# Patient Record
Sex: Male | Born: 1945
Health system: Southern US, Community
[De-identification: ages and names within clinical notes are randomized; demographics above are authoritative.]

## PROBLEM LIST (undated history)

## (undated) DIAGNOSIS — N183 Chronic kidney disease, stage 3 unspecified: Secondary | ICD-10-CM

## (undated) DIAGNOSIS — I6381 Other cerebral infarction due to occlusion or stenosis of small artery: Secondary | ICD-10-CM

## (undated) DIAGNOSIS — F329 Major depressive disorder, single episode, unspecified: Secondary | ICD-10-CM

## (undated) DIAGNOSIS — E785 Hyperlipidemia, unspecified: Secondary | ICD-10-CM

## (undated) DIAGNOSIS — G3184 Mild cognitive impairment, so stated: Secondary | ICD-10-CM

## (undated) DIAGNOSIS — R296 Repeated falls: Secondary | ICD-10-CM

## (undated) DIAGNOSIS — R269 Unspecified abnormalities of gait and mobility: Secondary | ICD-10-CM

## (undated) DIAGNOSIS — N419 Inflammatory disease of prostate, unspecified: Secondary | ICD-10-CM

## (undated) DIAGNOSIS — F32A Depression, unspecified: Secondary | ICD-10-CM

## (undated) DIAGNOSIS — I10 Essential (primary) hypertension: Secondary | ICD-10-CM

## (undated) DIAGNOSIS — E119 Type 2 diabetes mellitus without complications: Secondary | ICD-10-CM

## (undated) DIAGNOSIS — M722 Plantar fascial fibromatosis: Secondary | ICD-10-CM

## (undated) DIAGNOSIS — K219 Gastro-esophageal reflux disease without esophagitis: Secondary | ICD-10-CM

## (undated) DIAGNOSIS — N529 Male erectile dysfunction, unspecified: Secondary | ICD-10-CM

## (undated) HISTORY — PX: COLONOSCOPY: SHX174

## (undated) HISTORY — DX: Other cerebral infarction due to occlusion or stenosis of small artery: I63.81

## (undated) HISTORY — DX: Chronic kidney disease, stage 3 unspecified: N18.30

## (undated) HISTORY — DX: Repeated falls: R29.6

## (undated) HISTORY — DX: Plantar fascial fibromatosis: M72.2

## (undated) HISTORY — DX: Unspecified abnormalities of gait and mobility: R26.9

## (undated) HISTORY — DX: Essential (primary) hypertension: I10

## (undated) HISTORY — DX: Gastro-esophageal reflux disease without esophagitis: K21.9

## (undated) HISTORY — DX: Type 2 diabetes mellitus without complications: E11.9

## (undated) HISTORY — DX: Inflammatory disease of prostate, unspecified: N41.9

## (undated) HISTORY — PX: CIRCUMCISION: SUR203

## (undated) HISTORY — DX: Mild cognitive impairment of uncertain or unknown etiology: G31.84

## (undated) HISTORY — DX: Male erectile dysfunction, unspecified: N52.9

## (undated) HISTORY — DX: Major depressive disorder, single episode, unspecified: F32.9

## (undated) HISTORY — DX: Hyperlipidemia, unspecified: E78.5

## (undated) HISTORY — DX: Depression, unspecified: F32.A

---
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
## 2004-08-22 ENCOUNTER — Ambulatory Visit (HOSPITAL_COMMUNITY): Admission: RE | Admit: 2004-08-22 | Discharge: 2004-08-22 | Payer: Self-pay | Admitting: Gastroenterology

## 2014-10-22 ENCOUNTER — Encounter: Payer: Self-pay | Admitting: *Deleted

## 2015-09-07 DIAGNOSIS — L309 Dermatitis, unspecified: Secondary | ICD-10-CM | POA: Diagnosis not present

## 2015-10-09 DIAGNOSIS — E782 Mixed hyperlipidemia: Secondary | ICD-10-CM | POA: Diagnosis not present

## 2015-10-09 DIAGNOSIS — N183 Chronic kidney disease, stage 3 (moderate): Secondary | ICD-10-CM | POA: Diagnosis not present

## 2015-10-09 DIAGNOSIS — Z0000 Encounter for general adult medical examination without abnormal findings: Secondary | ICD-10-CM | POA: Diagnosis not present

## 2015-10-09 DIAGNOSIS — I10 Essential (primary) hypertension: Secondary | ICD-10-CM | POA: Diagnosis not present

## 2015-10-09 DIAGNOSIS — R399 Unspecified symptoms and signs involving the genitourinary system: Secondary | ICD-10-CM | POA: Diagnosis not present

## 2015-10-09 DIAGNOSIS — E1122 Type 2 diabetes mellitus with diabetic chronic kidney disease: Secondary | ICD-10-CM | POA: Diagnosis not present

## 2015-10-09 DIAGNOSIS — Z1159 Encounter for screening for other viral diseases: Secondary | ICD-10-CM | POA: Diagnosis not present

## 2015-10-09 DIAGNOSIS — N402 Nodular prostate without lower urinary tract symptoms: Secondary | ICD-10-CM | POA: Diagnosis not present

## 2016-02-02 DIAGNOSIS — N400 Enlarged prostate without lower urinary tract symptoms: Secondary | ICD-10-CM | POA: Diagnosis not present

## 2016-02-02 DIAGNOSIS — N183 Chronic kidney disease, stage 3 (moderate): Secondary | ICD-10-CM | POA: Diagnosis not present

## 2016-02-02 DIAGNOSIS — E782 Mixed hyperlipidemia: Secondary | ICD-10-CM | POA: Diagnosis not present

## 2016-02-02 DIAGNOSIS — K219 Gastro-esophageal reflux disease without esophagitis: Secondary | ICD-10-CM | POA: Diagnosis not present

## 2016-02-02 DIAGNOSIS — Z794 Long term (current) use of insulin: Secondary | ICD-10-CM | POA: Diagnosis not present

## 2016-02-02 DIAGNOSIS — Z7984 Long term (current) use of oral hypoglycemic drugs: Secondary | ICD-10-CM | POA: Diagnosis not present

## 2016-02-02 DIAGNOSIS — I10 Essential (primary) hypertension: Secondary | ICD-10-CM | POA: Diagnosis not present

## 2016-02-02 DIAGNOSIS — E1122 Type 2 diabetes mellitus with diabetic chronic kidney disease: Secondary | ICD-10-CM | POA: Diagnosis not present

## 2016-02-02 DIAGNOSIS — N529 Male erectile dysfunction, unspecified: Secondary | ICD-10-CM | POA: Diagnosis not present

## 2016-02-02 LAB — CBC AND DIFFERENTIAL: WBC: 6.8 10^3/mL

## 2016-02-02 LAB — BASIC METABOLIC PANEL
BUN: 33 mg/dL — AB (ref 4–21)
Creatinine: 1.5 mg/dL — AB (ref 0.6–1.3)
Glucose: 189 mg/dL
POTASSIUM: 4.1 mmol/L (ref 3.4–5.3)
SODIUM: 137 mmol/L (ref 137–147)

## 2016-02-02 LAB — LIPID PANEL
HDL: 28 mg/dL — AB (ref 35–70)
LDL Cholesterol: 97 mg/dL
LDL/HDL RATIO: 6.4

## 2016-02-02 LAB — TSH: TSH: 1.71 u[IU]/mL (ref 0.41–5.90)

## 2016-02-02 LAB — HEMOGLOBIN A1C: Hemoglobin A1C: 8.4

## 2016-02-22 ENCOUNTER — Encounter: Payer: Self-pay | Admitting: *Deleted

## 2016-02-28 ENCOUNTER — Ambulatory Visit (INDEPENDENT_AMBULATORY_CARE_PROVIDER_SITE_OTHER): Payer: Commercial Managed Care - HMO | Admitting: Endocrinology

## 2016-02-28 ENCOUNTER — Encounter: Payer: Self-pay | Admitting: Endocrinology

## 2016-02-28 VITALS — BP 120/70 | HR 61 | Temp 97.6°F | Resp 12 | Ht 69.0 in | Wt 184.6 lb

## 2016-02-28 DIAGNOSIS — E1165 Type 2 diabetes mellitus with hyperglycemia: Secondary | ICD-10-CM | POA: Insufficient documentation

## 2016-02-28 DIAGNOSIS — E1122 Type 2 diabetes mellitus with diabetic chronic kidney disease: Secondary | ICD-10-CM | POA: Diagnosis not present

## 2016-02-28 DIAGNOSIS — E782 Mixed hyperlipidemia: Secondary | ICD-10-CM | POA: Insufficient documentation

## 2016-02-28 DIAGNOSIS — N182 Chronic kidney disease, stage 2 (mild): Secondary | ICD-10-CM

## 2016-02-28 DIAGNOSIS — I10 Essential (primary) hypertension: Secondary | ICD-10-CM | POA: Diagnosis not present

## 2016-02-28 DIAGNOSIS — Z794 Long term (current) use of insulin: Secondary | ICD-10-CM

## 2016-02-28 HISTORY — DX: Mixed hyperlipidemia: E78.2

## 2016-02-28 HISTORY — DX: Type 2 diabetes mellitus with hyperglycemia: E11.65

## 2016-02-28 MED ORDER — DULAGLUTIDE 0.75 MG/0.5ML ~~LOC~~ SOAJ: SUBCUTANEOUS | Status: DC

## 2016-02-28 NOTE — Patient Instructions (Addendum)
May take lantus in ams: reduce to 50 units and if sugars are <100 twice reduce by 5 units  Check blood sugars on waking up 3  times a week Also check blood sugars about 2 hours after a meal and do this after different meals by rotation  Recommended blood sugar levels on waking up is 90-130 and about 2 hours after meal is 130-160  Please bring your blood sugar monitor to each visit, thank you  Start TRULICITYwith the pen as shown once weekly on the same day of the week.  You may inject in the stomach, thigh or arm as indicated in the brochure given.  You will feel fullness of the stomach with starting the medication and should try to keep the portions at meals small.   You may experience nausea in the first few days which usually gets better over time   If any questions or concerns are present call the office or the  [facility] at [phone].  Also visit [email or web address] website for more useful information  Start brisk walking daily

## 2016-02-28 NOTE — Progress Notes (Signed)
Patient ID: Joel Webb, male   DOB: [birth year], 70 y.o.   MRN: [id number]           Reason for Appointment: Consultation for Type 2 Diabetes  Referring physician: Moreen Fowler  History of Present Illness:          Date of diagnosis of type 2 diabetes mellitus: 2007       Background history:   He is unclear when he was diagnosed to have diabetes but was not very symptomatic He had been on metformin alone for several years with reportedly good control Also had previously been very consistent with exercise and diet His blood sugars probably started going up 3-4 years ago and he was given Lantus insulin in addition  Recent history:   INSULIN regimen is: Lantus  53 units in p.m. since 11/16  Current blood sugar patterns and problems identified:  He is being referred here for persistently high A1c of 8.4 since 11/16  He apparently has not tried any diabetes medications except insulin and metformin in the past  He does not check his blood sugars after meals and is doing them only in the morning  He thinks his blood sugars maybe higher on some mornings because of forgetting the Lantus at night, he tries to take this between 6-8 PM usually  Does not do any programmed exercise  Although he is trying to eat healthier meals he is usually eating a significant amount of carbohydrates at all meals; does not eat protein at breakfast usually     Non-insulin hypoglycemic drugs the patient is taking are:      Side effects from medications have been:  Compliance with the medical regimen: fair forget Hypoglycemia:    Glucose monitoring:  done 0.4 times a day         Glucometer:  Accu-Chek Blood Glucose readings by time of day and averages from meter download:  PREMEAL Breakfast Lunch Dinner Bedtime  Overall   Glucose range: 97-211       Median: 168        Self-care: The diet that the patient has been following is: tries to limit High-fat foods .      Typical meal intake: Breakfast is  toast, grits              Dietician visit, most recent: At diagnosis only               Exercise:  minimal except doing some lifting while working   Weight history:  Wt Readings from Last 3 Encounters:  02/28/16 184 lb 9.6 oz (83.734 kg)    Glycemic control:   Lab Results  Component Value Date   HGBA1C 8.4 02/02/2016   Lab Results  Component Value Date   LDLCALC 97 02/02/2016   CREATININE 1.5* 02/02/2016   No results found for: MICRALBCREAT        Medication List       This list is accurate as of: 02/28/16 12:51 PM.  Always use your most recent med list.               aspirin 81 MG chewable tablet  Chew by mouth daily.     atorvastatin 80 MG tablet  Commonly known as:  LIPITOR  Take 80 mg by mouth daily.     Dulaglutide 0.75 MG/0.5ML Sopn  Commonly known as:  TRULICITY  Inject in the abdominal skin as directed once a week     fenofibrate 160 MG tablet  Take 160 mg by mouth daily.     LANTUS 100 UNIT/ML injection  Generic drug:  insulin glargine  Inject 53 Units into the skin at bedtime. 50 UNITS AT NIGHT     losartan-hydrochlorothiazide 100-12.5 MG tablet  Commonly known as:  HYZAAR  Take 1 tablet by mouth daily.     metFORMIN 1000 MG tablet  Commonly known as:  GLUCOPHAGE  Take 1,000 mg by mouth 2 (two) times daily with a meal.     omeprazole 20 MG capsule  Commonly known as:  PRILOSEC  Take 20 mg by mouth every other day.     tamsulosin 0.4 MG Caps capsule  Commonly known as:  FLOMAX  Take 0.4 mg by mouth.        Allergies: No Known Allergies  Past Medical History  Diagnosis Date  . Diabetes mellitus without complication ([place])   . Hypertension   . ED (erectile dysfunction)   . Plantar fasciitis   . Prostatitis     Past Surgical History  Procedure Laterality Date  . Circumcision    . Colonoscopy      Family History  Problem Relation Age of Onset  . Hepatitis C Mother   . Mesothelioma Father   . Diabetes Father   . Heart  disease Father     Social History:  reports that he has quit smoking. He does not have any smokeless tobacco history on file. He reports that he drinks alcohol. He reports that he does not use illicit drugs.    Review of Systems    Lipid history: Has been on Lipitor, has triglycerides over 200    Lab Results  Component Value Date   HDL 28* 02/02/2016   LDLCALC 97 02/02/2016           Hypertension:Long-standing, treated with Hyzaar  Most recent eye exam was in 11/16  Most recent foot exam: 3/17  Review of Systems  Constitutional: Negative for weight loss.  Eyes: Negative for blurred vision.  Respiratory: Negative for shortness of breath.   Cardiovascular: Negative for chest pain, palpitations and leg swelling.  Gastrointestinal: Negative for diarrhea.  Endocrine: Positive for decreased libido and erectile dysfunction. Negative for fatigue and polydipsia.  Genitourinary: Positive for nocturia.       Getting up at night 1 or 2 times  Musculoskeletal: Negative for joint pain.  Skin: Negative for rash.  Neurological: Negative for balance difficulty.       Occasional mild tingling in his feet.  Previously was having sharp pains in his feet.  No numbness  Psychiatric/Behavioral: Negative for insomnia.      LABS:  Abstract on 02/22/2016  Component Date Value Ref Range Status  . WBC 02/02/2016 6.8   Final  . Glucose 02/02/2016 189   Final  . BUN 02/02/2016 33* 4 - 21 mg/dL Final  . Creatinine 02/02/2016 1.5* 0.6 - 1.3 mg/dL Final  . Potassium 02/02/2016 4.1  3.4 - 5.3 mmol/L Final  . Sodium 02/02/2016 137  137 - 147 mmol/L Final  . LDl/HDL Ratio 02/02/2016 6.4   Final  . HDL 02/02/2016 28* 35 - 70 mg/dL Final  . LDL Cholesterol 02/02/2016 97   Final  . Hemoglobin A1C 02/02/2016 8.4   Final  . TSH 02/02/2016 1.71  0.41 - 5.90 uIU/mL Final    Physical Examination:  BP 120/70 mmHg  Pulse 61  Temp(Src) 97.6 F (36.4 C) (Oral)  Resp 12  Ht 5\' 9"  (1.753 m)  Wt 184  lb 9.6 oz (83.734 kg)  BMI 27.25 kg/m2  SpO2 96%  GENERAL: He is well built and nourished, minimal abdominal obesity HEENT:         Eye exam shows normal external appearance. Fundus exam shows no retinopathy.  Oral exam shows normal mucosa .  NECK:   There is no lymphadenopathy Thyroid is not enlarged and no nodules felt.  Carotids are normal to palpation and no bruit heard LUNGS:         Chest is symmetrical. Lungs are clear to auscultation.Marland Kitchen   HEART:         Heart sounds:  S1 and S2 are normal. No murmur or click heard., no S3 or S4.   ABDOMEN:   There is no distention present. Liver and spleen are not palpable. No other mass or tenderness present.   NEUROLOGICAL:   Ankle jerks are absent bilaterally.    Diabetic Foot Exam - Simple   Simple Foot Form  Diabetic Foot exam was performed with the following findings:  Yes 02/28/2016 12:29 PM  Visual Inspection  No deformities, no ulcerations, no other skin breakdown bilaterally:  Yes  Sensation Testing  Intact to touch and monofilament testing bilaterally:  Yes  Pulse Check  Posterior Tibialis and Dorsalis pulse intact bilaterally:  Yes  Comments             Vibration sense is Moderately reduced in distal first toes. MUSCULOSKELETAL:  There is no swelling or deformity of the peripheral joints. Spine is normal to inspection.   EXTREMITIES:     There is no edema. No skin lesions present.Marland Kitchen SKIN:       No rash or lesions of concern.        ASSESSMENT:  Diabetes type 2, uncontrolled with last A1c 8.4 Currently taking basal insulin and metformin Although his fasting blood sugars are averaging 168 which he has high postprandial readings which he does not monitor at all. His diet and exercise regimen is suboptimal as discussed above  Although he is being managed with metformin and insulin he has never tried other diabetes medications including GLP-1 drugs. His creatinine clearance as of 3/17 is borderline for use of SGLT 2 drugs      Complications: Erectile dysfunction, mild neuropathy  HYPERTENSION: Long-standing history  Renal dysfunction of unclear etiology, mild.  HYPERLIPIDEMIA: Controlled with 80 mg Lipitor but tends to have persistently high triglycerides  PLAN:    Discussed with the patient the nature of GLP-1 drugs, the action on various organ systems, how they benefit blood glucose control, as well as the benefit of weight loss and  increase satiety . Explained possible side effects especially nausea and vomiting; discussed safety information in package insert. Demonstrated the medication injection device and injection technique to the patient. Discussed injection sites and titration of Trulicity starting with 0.75 mg once a week for 4 weeks and then increasing to 1.5 mg if no symptoms of nausea. Patient brochure on Trulicity and co-pay card given  He can try taking Lantus in the morning for better compliance, for now can reduce the dose to 50 units  Discussed gradually decreasing Lantus dose by 5 units if fasting blood sugars with starting Trulicity start coming down to 100 or below  Try to monitor fasting and postprandial readings by rotation  Regular walking  Add a protein to breakfast  Consultation with dietitian  Follow-up in 3 weeks  Patient Instructions  May take lantus in ams: reduce to 50 units and if  sugars are <100 twice reduce by 5 units  Check blood sugars on waking up 3  times a week Also check blood sugars about 2 hours after a meal and do this after different meals by rotation  Recommended blood sugar levels on waking up is 90-130 and about 2 hours after meal is 130-160  Please bring your blood sugar monitor to each visit, thank you  Start TRULICITYwith the pen as shown once weekly on the same day of the week.  You may inject in the stomach, thigh or arm as indicated in the brochure given.  You will feel fullness of the stomach with starting the medication and should try to keep  the portions at meals small.   You may experience nausea in the first few days which usually gets better over time   If any questions or concerns are present call the office or the  [facility] at [phone].  Also visit [email or web address] website for more useful information  Start brisk walking daily      Zayde Stroupe 02/28/2016, 12:51 PM   Note: This office note was prepared with Dragon voice recognition system technology. Any transcriptional errors that result from this process are unintentional.

## 2016-03-28 ENCOUNTER — Ambulatory Visit (INDEPENDENT_AMBULATORY_CARE_PROVIDER_SITE_OTHER): Payer: Self-pay | Admitting: Endocrinology

## 2016-03-28 VITALS — BP 124/80 | HR 63 | Temp 97.9°F | Resp 16 | Ht 69.0 in | Wt 182.4 lb

## 2016-03-28 DIAGNOSIS — E1165 Type 2 diabetes mellitus with hyperglycemia: Secondary | ICD-10-CM

## 2016-03-28 DIAGNOSIS — Z794 Long term (current) use of insulin: Secondary | ICD-10-CM | POA: Diagnosis not present

## 2016-03-28 MED ORDER — DULAGLUTIDE 0.75 MG/0.5ML ~~LOC~~ SOAJ: SUBCUTANEOUS | Status: DC

## 2016-03-28 NOTE — Patient Instructions (Signed)
Start some exercise  Check blood sugars on waking up 3  times a week Also check blood sugars about 2 hours after a meal and do this after different meals by rotation  Recommended blood sugar levels on waking up is 90-130 and about 2 hours after meal is 130-160  Please bring your blood sugar monitor to each visit, thank you

## 2016-03-28 NOTE — Progress Notes (Signed)
Patient ID: EUAL GUIDICE, male   DOB: Apr 04, 1946, 70 y.o.   MRN: [id number]           Reason for Appointment: Follow-up for Type 2 Diabetes  Referring physician: Moreen Fowler  History of Present Illness:          Date of diagnosis of type 2 diabetes mellitus: 2007       Background history:   He is unclear when he was diagnosed to have diabetes but was not very symptomatic He had been on metformin alone for several years with reportedly good control Also had previously been very consistent with exercise and diet His blood sugars probably started going up 3-4 years ago and he was given Lantus insulin in addition  Recent history:   INSULIN regimen is: Lantus  50 units in p.m. since 11/16  He was seen in consultation in 4/17 with an A1c of 8.4 and he was started on to the city in addition to his Lantus and metformin  Current blood sugar patterns and problems identified:  He is doing relatively well with adding Trulicity and has no side effects with this  He has not had any hypoglycemia with adding Trulicity and has not changed his insulin, previously taking 51 and now 15  He has not been scheduled to see the dietitian as yet  Although his weight is stable and he thinks he is generally watching his diet he has not seen any increase satiety with Trulicity  He has not had any regular programmed exercise although at is part-time work he may be a little more active    Non-insulin hypoglycemic drugs the patient is taking are:   Trulicity [id number] mg weekly, metformin 1 g twice a day     Side effects from medications have been: None  Compliance with the medical regimen: fair   Hypoglycemia: none    Glucose monitoring:  done 0.4 times a day         Glucometer:  Accu-Chek Blood Glucose readings by time of day and averages from meter download:  Mean values apply above for all meters except median for One Touch  PRE-MEAL Fasting Lunch Dinner Bedtime Overall  Glucose range: 90-173  97,  1:15  110-280  104    Mean/median: 127     135    Self-care: The diet that the patient has been following is: tries to limit High-fat foods .      Typical meal intake: Breakfast is toast, grits              Dietician visit, most recent: At diagnosis only               Exercise:  None except doing some lifting and walking while working   Weight history:  Wt Readings from Last 3 Encounters:  03/28/16 182 lb 6.4 oz (82.736 kg)  02/28/16 184 lb 9.6 oz (83.734 kg)    Glycemic control:   Lab Results  Component Value Date   HGBA1C 8.4 02/02/2016   Lab Results  Component Value Date   LDLCALC 97 02/02/2016   CREATININE 1.5* 02/02/2016   No results found for: MICRALBCREAT        Medication List       This list is accurate as of: 03/28/16  9:38 AM.  Always use your most recent med list.               aspirin 81 MG chewable tablet  Chew by mouth daily.  atorvastatin 80 MG tablet  Commonly known as:  LIPITOR  Take 80 mg by mouth daily.     Dulaglutide 0.75 MG/0.5ML Sopn  Commonly known as:  TRULICITY  Inject in the abdominal skin as directed once a week     fenofibrate 160 MG tablet  Take 160 mg by mouth daily.     LANTUS 100 UNIT/ML injection  Generic drug:  insulin glargine  Inject 50 Units into the skin at bedtime. 50 UNITS AT NIGHT     losartan-hydrochlorothiazide 100-12.5 MG tablet  Commonly known as:  HYZAAR  Take 1 tablet by mouth daily.     metFORMIN 1000 MG tablet  Commonly known as:  GLUCOPHAGE  Take 1,000 mg by mouth 2 (two) times daily with a meal.     omeprazole 20 MG capsule  Commonly known as:  PRILOSEC  Take 20 mg by mouth every other day.     tamsulosin 0.4 MG Caps capsule  Commonly known as:  FLOMAX  Take 0.4 mg by mouth.        Allergies: No Known Allergies  Past Medical History  Diagnosis Date  . Diabetes mellitus without complication ([place])   . Hypertension   . ED (erectile dysfunction)   . Plantar fasciitis   .  Prostatitis     Past Surgical History  Procedure Laterality Date  . Circumcision    . Colonoscopy      Family History  Problem Relation Age of Onset  . Hepatitis C Mother   . Mesothelioma Father   . Diabetes Father   . Heart disease Father     Social History:  reports that he has quit smoking. He does not have any smokeless tobacco history on file. He reports that he drinks alcohol. He reports that he does not use illicit drugs.    Review of Systems    Lipid history: Has been on Lipitor, has triglycerides over 200    Lab Results  Component Value Date   HDL 28* 02/02/2016   LDLCALC 97 02/02/2016           Hypertension:Long-standing, treated with Hyzaar  Most recent eye exam was in 11/16  Most recent foot exam: 3/17  Review of Systems     Physical Examination:  BP 124/80 mmHg  Pulse 63  Temp(Src) 97.9 F (36.6 C)  Resp 16  Ht 5\' 9"  (1.753 m)  Wt 182 lb 6.4 oz (82.736 kg)  BMI 26.92 kg/m2  SpO2 98%       ASSESSMENT:  Diabetes type 2, uncontrolled with last A1c 8.4 Currently taking Trulicity in addition to his basal insulin and metformin With this his fasting blood sugars are less variable and overall lower He has only one high nonfasting reading when he went off his diet Although no objective labs are available he does appear to be benefiting from Trulicity and is tolerating this.  PLAN:    No change in insulin as yet More readings at bedtime  Start walking on his days off  Consultation with dietitian to be scheduled  Follow-up in 6 weeks with A1c  Patient Instructions  Start some exercise  Check blood sugars on waking up 3  times a week Also check blood sugars about 2 hours after a meal and do this after different meals by rotation  Recommended blood sugar levels on waking up is 90-130 and about 2 hours after meal is 130-160  Please bring your blood sugar monitor to each visit, thank you  Zeniyah Peaster 03/28/2016, 9:38 AM   Note:  This office note was prepared with Dragon voice recognition system technology. Any transcriptional errors that result from this process are unintentional.

## 2016-04-25 ENCOUNTER — Encounter: Payer: Commercial Managed Care - HMO | Attending: Endocrinology | Admitting: Dietician

## 2016-04-25 ENCOUNTER — Encounter: Payer: Self-pay | Admitting: Dietician

## 2016-04-25 VITALS — Ht 69.5 in | Wt 181.0 lb

## 2016-04-25 DIAGNOSIS — E119 Type 2 diabetes mellitus without complications: Secondary | ICD-10-CM | POA: Insufficient documentation

## 2016-04-25 DIAGNOSIS — E118 Type 2 diabetes mellitus with unspecified complications: Secondary | ICD-10-CM

## 2016-04-25 DIAGNOSIS — Z713 Dietary counseling and surveillance: Secondary | ICD-10-CM | POA: Insufficient documentation

## 2016-04-25 NOTE — Patient Instructions (Signed)
Keep up your exercise habit.  Aim for 3 Carb Choices per meal (45 grams) +/- 1 either way  Aim for 0-1 Carbs per snack if hungry  Include protein in moderation with your meals and snacks Consider reading food labels for Total Carbohydrate and Fat Grams of foods Consider  increasing your activity level by walking, tennis for 30-60 minutes daily as tolerated Consider checking BG at alternate times per day as directed by MD  Consider taking medication as directed by MD

## 2016-04-25 NOTE — Progress Notes (Signed)
  Medical Nutrition Therapy:  Appt start time: 0815 end time:  0915. Taken late due to IT issues.  Assessment:  Primary concerns today: Patient is here alone.  He would like to learn how to eat better. Hx includes type 2 diabetes for about 10 years, HTN, and HLD.  His last HgbA1C was 8.4% (02/02/16).  He checks his blood sugar bid with morning readings <100, and evening reading of about 130.  Weight today 181 lbs decreased from 184 lbs 2 months ago.  He reports having increased his exercise.  Patient lives with his wife.  He does the shopping and cooking.  His wife is undergoing treatment for breast cancer.  He is retired but works part time delivering parts.  Preferred Learning Style:   No preference indicated   Learning Readiness:   Ready  MEDICATIONS: see list to includes:  Trulicity, lantus 50 units every HS and metformin.   DIETARY INTAKE:  24-hr recall:  B ( AM): scrambled or boiled egg, occasional 2 strips bacon, used to eat toast, fruit, coffee with cream  Snk ( AM): raw veges, nuts  L ( PM): subway or burger Snk ( PM): veges, nuts D ( PM): pork chops, steamed vegetables, 1/2 sweet potato Snk ( PM): popcorn Beverages: water, unsweetened tea, diet coke  Usual physical activity: Walks the dog daily 30 minutes bid.  [employer] 3 times per week and walks on the treadmill for 20-30 minutes.  Estimated energy needs: 1800 calories 200 g carbohydrates 113 g protein 60 g fat  Progress Towards Goal(s):  In progress.   Nutritional Diagnosis:  NB-1.1 Food and nutrition-related knowledge deficit As related to balance of carbohydrate, protein, and fat.  As evidenced by patient report.    Intervention:  Nutrition counseling and diabetes education initiated. Discussed Carb Counting by food group as method of portion control, reading food labels, and benefits of increased activity. Also discussed basic physiology of Diabetes, target BG ranges pre and post meals, and A1c.   Keep up  your exercise habit.  Aim for 3 Carb Choices per meal (45 grams) +/- 1 either way  Aim for 0-1 Carbs per snack if hungry  Include protein in moderation with your meals and snacks Consider reading food labels for Total Carbohydrate and Fat Grams of foods Consider  increasing your activity level by walking, tennis for 30-60 minutes daily as tolerated Consider checking BG at alternate times per day as directed by MD  Consider taking medication as directed by MD   Teaching Method Utilized:  Visual Auditory Hands on  Handouts given during visit include: Living Well with Diabetes Carb Counting and Food Label handouts Meal Plan Card Label reading  A1C sheet  Snack list  Barriers to learning/adherence to lifestyle change: none  Demonstrated degree of understanding via:  Teach Back   Monitoring/Evaluation:  Dietary intake, exercise, label reading, and body weight prn.

## 2016-05-24 ENCOUNTER — Other Ambulatory Visit (INDEPENDENT_AMBULATORY_CARE_PROVIDER_SITE_OTHER): Payer: Commercial Managed Care - HMO

## 2016-05-24 DIAGNOSIS — E1165 Type 2 diabetes mellitus with hyperglycemia: Secondary | ICD-10-CM | POA: Diagnosis not present

## 2016-05-24 DIAGNOSIS — Z794 Long term (current) use of insulin: Secondary | ICD-10-CM

## 2016-05-24 LAB — BASIC METABOLIC PANEL
BUN: 28 mg/dL — AB (ref 6–23)
CHLORIDE: 108 meq/L (ref 96–112)
CO2: 28 mEq/L (ref 19–32)
CREATININE: 1.42 mg/dL (ref 0.40–1.50)
Calcium: 9.7 mg/dL (ref 8.4–10.5)
GFR: 52.45 mL/min — AB (ref >60.00)
Glucose, Bld: 124 mg/dL — ABNORMAL HIGH (ref 70–99)
POTASSIUM: 4.6 meq/L (ref 3.5–5.1)
Sodium: 142 mEq/L (ref 135–145)

## 2016-05-24 LAB — HEMOGLOBIN A1C: HEMOGLOBIN A1C: 7 % — AB (ref 4.6–6.5)

## 2016-05-25 LAB — FRUCTOSAMINE: FRUCTOSAMINE: 234 umol/L (ref 0–285)

## 2016-05-29 ENCOUNTER — Ambulatory Visit (INDEPENDENT_AMBULATORY_CARE_PROVIDER_SITE_OTHER): Payer: Commercial Managed Care - HMO | Admitting: Endocrinology

## 2016-05-29 ENCOUNTER — Encounter: Payer: Self-pay | Admitting: Endocrinology

## 2016-05-29 VITALS — BP 110/84 | HR 70 | Ht 70.0 in | Wt 183.0 lb

## 2016-05-29 DIAGNOSIS — Z794 Long term (current) use of insulin: Secondary | ICD-10-CM | POA: Diagnosis not present

## 2016-05-29 DIAGNOSIS — E1165 Type 2 diabetes mellitus with hyperglycemia: Secondary | ICD-10-CM

## 2016-05-29 MED ORDER — CANAGLIFLOZIN 100 MG PO TABS: ORAL_TABLET | ORAL | Status: DC

## 2016-05-29 NOTE — Progress Notes (Signed)
Patient ID: Joel Webb, male   DOB: 05-Jun-1946, 70 y.o.   MRN: [id number]           Reason for Appointment: Follow-up for Type 2 Diabetes  Referring physician: Moreen Fowler  History of Present Illness:          Date of diagnosis of type 2 diabetes mellitus: 2007       Background history:   He is unclear when he was diagnosed to have diabetes but was not very symptomatic He had been on metformin alone for several years with reportedly good control Also had previously been very consistent with exercise and diet His blood sugars probably started going up 3-4 years ago and he was given Lantus insulin in addition  Recent history:   INSULIN regimen is: Lantus  50 units in p.m. since 11/16  He was seen in consultation in 4/17 with an A1c of 8.4 and he was started Trulicity in addition to his Lantus and metformin His A1c down to 7%  Current blood sugar patterns and problems identified:  He is doing relatively well with adding Trulicity and has no side effects with this  He however does not want to continue this because of the high out-of-pocket expense with his donut hole  He still has some high postprandial readings in the evenings and not clear which readings are after supper  He has not been able to lose any weight  He has seen the dietitian  Non-insulin hypoglycemic drugs the patient is taking are:   Trulicity [id number] mg weekly, metformin 1 g twice a day     Side effects from medications have been: None  Compliance with the medical regimen: fair   Hypoglycemia: none    Glucose monitoring:  done 0.4 times a day         Glucometer:  Accu-Chek Blood Glucose readings by time of day and averages from meter download:  Mean values apply above for all meters except median for One Touch  PRE-MEAL Fasting Lunch Dinner Bedtime Overall  Glucose range: 106-160   107-188     Mean/median: 131     133    Self-care: The diet that the patient has been following is: tries to limit  High-fat foods .      Typical meal intake: Breakfast is toast, grits              Dietician visit, most recent:6/17               Exercise:  None except doing some lifting and walking while working   Weight history:  Wt Readings from Last 3 Encounters:  05/29/16 183 lb (83.008 kg)  04/25/16 181 lb (82.101 kg)  03/28/16 182 lb 6.4 oz (82.736 kg)    Glycemic control:   Lab Results  Component Value Date   HGBA1C 7.0* 05/24/2016   HGBA1C 8.4 02/02/2016   Lab Results  Component Value Date   LDLCALC 97 02/02/2016   CREATININE 1.42 05/24/2016   No results found for: MICRALBCREAT        Medication List       This list is accurate as of: 05/29/16 11:59 PM.  Always use your most recent med list.               aspirin 81 MG chewable tablet  Chew by mouth daily. Every other day     atorvastatin 80 MG tablet  Commonly known as:  LIPITOR  Take 80 mg by mouth daily.  canagliflozin 100 MG Tabs tablet  Commonly known as:  INVOKANA  1 tablet before breakfast     Dulaglutide 0.75 MG/0.5ML Sopn  Commonly known as:  TRULICITY  Inject in the abdominal skin as directed once a week     fenofibrate 160 MG tablet  Take 160 mg by mouth daily.     LANTUS 100 UNIT/ML injection  Generic drug:  insulin glargine  Inject 50 Units into the skin at bedtime. 50 UNITS AT NIGHT     losartan-hydrochlorothiazide 100-12.5 MG tablet  Commonly known as:  HYZAAR  Take 1 tablet by mouth daily.     metFORMIN 1000 MG tablet  Commonly known as:  GLUCOPHAGE  Take 1,000 mg by mouth 2 (two) times daily with a meal.     multivitamin with minerals Tabs tablet  Take 1 tablet by mouth daily.     omeprazole 20 MG capsule  Commonly known as:  PRILOSEC  Take 20 mg by mouth every other day. PRN     tamsulosin 0.4 MG Caps capsule  Commonly known as:  FLOMAX  Take 0.4 mg by mouth. Reported on 05/29/2016        Allergies: No Known Allergies  Past Medical History  Diagnosis Date  .  Diabetes mellitus without complication ([place])   . Hypertension   . ED (erectile dysfunction)   . Plantar fasciitis   . Prostatitis   . Hyperlipidemia     Past Surgical History  Procedure Laterality Date  . Circumcision    . Colonoscopy      Family History  Problem Relation Age of Onset  . Hepatitis C Mother   . Mesothelioma Father   . Diabetes Father   . Heart disease Father     Social History:  reports that he has quit smoking. He does not have any smokeless tobacco history on file. He reports that he drinks alcohol. He reports that he does not use illicit drugs.    Review of Systems    Lipid history: Has been on Lipitor, has triglycerides over 200    Lab Results  Component Value Date   HDL 28* 02/02/2016   LDLCALC 97 02/02/2016           Hypertension:Long-standing, treated with Hyzaar  Most recent eye exam was in 11/16  Most recent foot exam: 3/17  Review of Systems     Physical Examination:  BP 110/84 mmHg  Pulse 70  Ht 5\' 10"  (1.778 m)  Wt 183 lb (83.008 kg)  BMI 26.26 kg/m2  SpO2 95%       ASSESSMENT:  Diabetes type 2, uncontrolled with last A1c 8.4 Currently taking Trulicity in addition to his basal insulin and metformin Although his A1c is improved at 7% he does not want to continue Trulicity because of the expense Also reluctant to consider  basal bolus insulin regimen Still has some fluctuation in his blood sugars, relatively higher in the afternoon.  Not checking many readings after meals  PLAN:   Discussed action of SGLT 2 drugs on lowering glucose by decreasing kidney absorption of glucose, benefits of weight loss and lower blood pressure, possible side effects including candidiasis and dosage regimen  Start Invokana 100 mg daily with the 30 day trial With Invokana reduce the dose of Hyzaar to half tablet Discussed need to check blood sugars 2 hours after meals at various times and discussed blood sugar targets May need to reduce  Lantus if fasting blood sugar starts coming down with starting  Invokana, discussed titrating every 3-4 days by 4 units Will need to see him follow-up in one month  Patient Instructions  Check blood sugars on waking up 4-5  times a week Also check blood sugars about 2 hours after a meal and do this after different meals by rotation  Recommended blood sugar levels on waking up is 90-130 and about 2 hours after meal is 130-160  Please bring your blood sugar monitor to each visit, thank you  More sugars at 8-9 pm  Invokana in am  Cut Hyzaar in 1/2   Counseling time on subjects discussed above is over 50% of today's 25 minute visit  Kytzia Gienger 05/30/2016, 8:53 AM   Note: This office note was prepared with Estate agent. Any transcriptional errors that result from this process are unintentional.

## 2016-05-29 NOTE — Patient Instructions (Addendum)
Check blood sugars on waking up 4-5  times a week Also check blood sugars about 2 hours after a meal and do this after different meals by rotation  Recommended blood sugar levels on waking up is 90-130 and about 2 hours after meal is 130-160  Please bring your blood sugar monitor to each visit, thank you  More sugars at 8-9 pm  Invokana in am  Cut Hyzaar in 1/2

## 2016-06-25 ENCOUNTER — Other Ambulatory Visit (INDEPENDENT_AMBULATORY_CARE_PROVIDER_SITE_OTHER): Payer: Commercial Managed Care - HMO

## 2016-06-25 DIAGNOSIS — Z794 Long term (current) use of insulin: Secondary | ICD-10-CM

## 2016-06-25 DIAGNOSIS — E1165 Type 2 diabetes mellitus with hyperglycemia: Secondary | ICD-10-CM

## 2016-06-25 LAB — BASIC METABOLIC PANEL
BUN: 27 mg/dL — AB (ref 6–23)
CO2: 26 meq/L (ref 19–32)
Calcium: 9.8 mg/dL (ref 8.4–10.5)
Chloride: 106 mEq/L (ref 96–112)
Creatinine, Ser: 1.46 mg/dL (ref 0.40–1.50)
GFR: 50.78 mL/min — AB (ref >60.00)
GLUCOSE: 92 mg/dL (ref 70–99)
POTASSIUM: 4.5 meq/L (ref 3.5–5.1)
Sodium: 141 mEq/L (ref 135–145)

## 2016-06-26 ENCOUNTER — Encounter: Payer: Self-pay | Admitting: Endocrinology

## 2016-06-26 ENCOUNTER — Ambulatory Visit (INDEPENDENT_AMBULATORY_CARE_PROVIDER_SITE_OTHER): Payer: Commercial Managed Care - HMO | Admitting: Endocrinology

## 2016-06-26 VITALS — BP 122/76 | HR 60 | Ht 70.0 in | Wt 184.0 lb

## 2016-06-26 DIAGNOSIS — E1165 Type 2 diabetes mellitus with hyperglycemia: Secondary | ICD-10-CM | POA: Diagnosis not present

## 2016-06-26 DIAGNOSIS — I10 Essential (primary) hypertension: Secondary | ICD-10-CM

## 2016-06-26 DIAGNOSIS — Z794 Long term (current) use of insulin: Secondary | ICD-10-CM | POA: Diagnosis not present

## 2016-06-26 LAB — FRUCTOSAMINE: FRUCTOSAMINE: 229 umol/L (ref 0–285)

## 2016-06-26 MED ORDER — INSULIN GLARGINE 100 UNIT/ML ~~LOC~~ SOLN: 50.0000 [IU] | Freq: Every day | SUBCUTANEOUS | 2 refills | Status: DC

## 2016-06-26 MED ORDER — PEN NEEDLES 31G X 6 MM MISC: 2 refills | Status: DC

## 2016-06-26 NOTE — Patient Instructions (Addendum)
Check blood sugars on waking up    Also check blood sugars about 2 hours after a meal and do this after different meals by rotation  Recommended blood sugar levels on waking up is 90-130 and about 2 hours after meal is 130-160  Please bring your blood sugar monitor to each visit, thank you  Toujeo same dose as lantus

## 2016-06-26 NOTE — Progress Notes (Signed)
Patient ID: Joel Webb, male   DOB: 1946-07-27, 71 y.o.   MRN: [id number]           Reason for Appointment: Follow-up for Type 2 Diabetes  Referring physician: Moreen Fowler  History of Present Illness:          Date of diagnosis of type 2 diabetes mellitus: 2007       Background history:   He is unclear when he was diagnosed to have diabetes but was not very symptomatic He had been on metformin alone for several years with reportedly good control Also had previously been very consistent with exercise and diet His blood sugars probably started going up 3-4 years ago and he was given Lantus insulin in addition  Recent history:   INSULIN regimen is: Lantus  50 units in p.m.   He was seen in consultation in 4/17 with an A1c of 8.4 and he was started Trulicity in addition to his Lantus and metformin This was stopped in 7/17 because of high out-of-pocket expense He is now on Invokana His A1c was 7% in 7/17  Current blood sugar patterns and problems identified:  He is doing relatively well with switching from Trulicity to Invokana  He has mild increase in urination but no other symptoms  He does have sporadic high readings at various times but is not checking readings after his evening meal usually  Occasionally in the morning sugars maybe higher after drinking coffee  Probably has variable carbohydrate intake causing high readings after lunch sometimes  He has had no weight change  Non-insulin hypoglycemic drugs the patient is taking are: metformin 1 g twice a day     Side effects from medications have been: None  Compliance with the medical regimen: fair   Hypoglycemia: none    Glucose monitoring:  done 0.4 times a day         Glucometer:  Accu-Chek Blood Glucose readings by time of day and averages from meter download:  Mean values apply above for all meters except median for One Touch  PRE-MEAL Fasting Early afternoon  Dinner Bedtime Overall  Glucose range: 74-166   136-241  117-149  88, 154    Mean/median:     138   Self-care: The diet that the patient has been following is: tries to limit High-fat foods .                 Dietician visit, most recent:6/17               Exercise:  doing some lifting and walking while working   Weight history:  Wt Readings from Last 3 Encounters:  06/26/16 184 lb (83.5 kg)  05/29/16 183 lb (83 kg)  04/25/16 181 lb (82.1 kg)    Glycemic control:   Lab Results  Component Value Date   HGBA1C 7.0 (H) 05/24/2016   HGBA1C 8.4 02/02/2016   Lab Results  Component Value Date   LDLCALC 97 02/02/2016   CREATININE 1.46 06/25/2016   No results found for: MICRALBCREAT   Lab on 06/25/2016  Component Date Value Ref Range Status  . Sodium 06/25/2016 141  135 - 145 mEq/L Final  . Potassium 06/25/2016 4.5  3.5 - 5.1 mEq/L Final  . Chloride 06/25/2016 106  96 - 112 mEq/L Final  . CO2 06/25/2016 26  19 - 32 mEq/L Final  . Glucose, Bld 06/25/2016 92  70 - 99 mg/dL Final  . BUN 06/25/2016 27* 6 - 23 mg/dL Final  .  Creatinine, Ser 06/25/2016 1.46  0.40 - 1.50 mg/dL Final  . Calcium 06/25/2016 9.8  8.4 - 10.5 mg/dL Final  . GFR 06/25/2016 50.78* >60.00 mL/min Final  . Fructosamine 06/26/2016 229  0 - 285 umol/L Final   Comment: Published reference interval for apparently healthy subjects between age 11 and 38 is 22 - 285 umol/L and in a poorly controlled diabetic population is 228 - 563 umol/L with a mean of 396 umol/L.         Medication List       Accurate as of 06/26/16  5:03 PM. Always use your most recent med list.          aspirin 81 MG chewable tablet Chew by mouth daily. Every other day   atorvastatin 80 MG tablet Commonly known as:  LIPITOR Take 80 mg by mouth daily.   canagliflozin 100 MG Tabs tablet Commonly known as:  INVOKANA 1 tablet before breakfast   fenofibrate 160 MG tablet Take 160 mg by mouth daily.   insulin glargine 100 UNIT/ML injection Commonly known as:  LANTUS Inject  0.5 mLs (50 Units total) into the skin at bedtime. 50 UNITS AT NIGHT   losartan-hydrochlorothiazide 100-12.5 MG tablet Commonly known as:  HYZAAR Take 1 tablet by mouth daily.   metFORMIN 1000 MG tablet Commonly known as:  GLUCOPHAGE Take 1,000 mg by mouth 2 (two) times daily with a meal.   multivitamin with minerals Tabs tablet Take 1 tablet by mouth daily.   omeprazole 20 MG capsule Commonly known as:  PRILOSEC Take 20 mg by mouth every other day. PRN   Pen Needles 31G X 6 MM Misc Use to inject insulin daily.   tamsulosin 0.4 MG Caps capsule Commonly known as:  FLOMAX Take 0.4 mg by mouth. Reported on 05/29/2016       Allergies: No Known Allergies  Past Medical History:  Diagnosis Date  . Diabetes mellitus without complication ([place])   . ED (erectile dysfunction)   . Hyperlipidemia   . Hypertension   . Plantar fasciitis   . Prostatitis     Past Surgical History:  Procedure Laterality Date  . CIRCUMCISION    . COLONOSCOPY      Family History  Problem Relation Age of Onset  . Hepatitis C Mother   . Mesothelioma Father   . Diabetes Father   . Heart disease Father     Social History:  reports that he has quit smoking. He does not have any smokeless tobacco history on file. He reports that he drinks alcohol. He reports that he does not use drugs.    Review of Systems    Lipid history: Has been on Lipitor, has triglycerides over 200    Lab Results  Component Value Date   HDL 28 (A) 02/02/2016   LDLCALC 97 02/02/2016           Hypertension:Long-standing, treated with Hyzaar 1/2 tab, Dose was reduced when starting Invokana  Most recent eye exam was in 11/16  Most recent foot exam: 3/17  Review of Systems     Physical Examination:  BP 122/76   Pulse 60   Ht 5\' 10"  (1.778 m)   Wt 184 lb (83.5 kg)   SpO2 95%   BMI 26.40 kg/m        ASSESSMENT:  Diabetes type 2, uncontrolled with last A1c 8.4 Currently taking Invokana along with  his  basal insulin and metformin  Although his blood sugars are overall fairly good and  he has sporadic high readings as discussed above These are likely to be from variability in his diet and activity level He is doing well with Invokana with no side effects or change in renal function or potassium Has not changed his insulin dose, still on 50 units Lantus  HYPERTENSION: Still well controlled, Hyzaar has been reduced with adding Invokana  PLAN:    He will continue Invokana unchanged  He will try Toujeo instead of Lantus and this may give him more consistent control over 24 hours.  Have shown him how to use an insulin pen and given him detailed instructions on the Toujeo insulin  He will try to check more readings at bedtime  Follow diet given by dietitian consistently with limitation of high fat or high carbohydrate intake  A1c on the next visit  Patient Instructions  Check blood sugars on waking up    Also check blood sugars about 2 hours after a meal and do this after different meals by rotation  Recommended blood sugar levels on waking up is 90-130 and about 2 hours after meal is 130-160  Please bring your blood sugar monitor to each visit, thank you  Toujeo same dose as lantus  Counseling time on subjects discussed above is over 50% of today's 25 minute visit  Tregan Read 06/26/2016, 5:03 PM   Note: This office note was prepared with Dragon voice recognition system technology. Any transcriptional errors that result from this process are unintentional.

## 2016-06-27 DIAGNOSIS — H5210 Myopia, unspecified eye: Secondary | ICD-10-CM | POA: Diagnosis not present

## 2016-06-27 DIAGNOSIS — H5200 Hypermetropia, unspecified eye: Secondary | ICD-10-CM | POA: Diagnosis not present

## 2016-07-10 ENCOUNTER — Encounter: Payer: Self-pay | Admitting: Endocrinology

## 2016-07-10 ENCOUNTER — Other Ambulatory Visit: Payer: Self-pay | Admitting: *Deleted

## 2016-07-10 MED ORDER — INSULIN GLARGINE 300 UNIT/ML ~~LOC~~ SOPN: 50.0000 [IU] | PEN_INJECTOR | Freq: Every day | SUBCUTANEOUS | 1 refills | Status: DC

## 2016-07-10 MED ORDER — INSULIN GLARGINE 100 UNIT/ML ~~LOC~~ SOLN: 50.0000 [IU] | Freq: Every day | SUBCUTANEOUS | 2 refills | Status: DC

## 2016-08-21 ENCOUNTER — Other Ambulatory Visit (INDEPENDENT_AMBULATORY_CARE_PROVIDER_SITE_OTHER): Payer: Commercial Managed Care - HMO

## 2016-08-21 DIAGNOSIS — E1165 Type 2 diabetes mellitus with hyperglycemia: Secondary | ICD-10-CM

## 2016-08-21 DIAGNOSIS — Z794 Long term (current) use of insulin: Secondary | ICD-10-CM

## 2016-08-21 LAB — BASIC METABOLIC PANEL WITH GFR
BUN: 30 mg/dL — ABNORMAL HIGH (ref 6–23)
CO2: 26 meq/L (ref 19–32)
Calcium: 9.2 mg/dL (ref 8.4–10.5)
Chloride: 107 meq/L (ref 96–112)
Creatinine, Ser: 1.57 mg/dL — ABNORMAL HIGH (ref 0.40–1.50)
GFR: 46.67 mL/min — ABNORMAL LOW (ref >60.00)
Glucose, Bld: 141 mg/dL — ABNORMAL HIGH (ref 70–99)
Potassium: 4.9 meq/L (ref 3.5–5.1)
Sodium: 141 meq/L (ref 135–145)

## 2016-08-21 LAB — HEMOGLOBIN A1C: Hgb A1c MFr Bld: 6.9 % — ABNORMAL HIGH (ref 4.6–6.5)

## 2016-08-26 ENCOUNTER — Encounter: Payer: Self-pay | Admitting: Endocrinology

## 2016-08-26 ENCOUNTER — Ambulatory Visit (INDEPENDENT_AMBULATORY_CARE_PROVIDER_SITE_OTHER): Payer: Commercial Managed Care - HMO | Admitting: Endocrinology

## 2016-08-26 VITALS — BP 125/75 | HR 60 | Temp 97.9°F | Resp 14 | Ht 70.0 in | Wt 183.6 lb

## 2016-08-26 DIAGNOSIS — Z794 Long term (current) use of insulin: Secondary | ICD-10-CM | POA: Diagnosis not present

## 2016-08-26 DIAGNOSIS — E1165 Type 2 diabetes mellitus with hyperglycemia: Secondary | ICD-10-CM

## 2016-08-26 LAB — MICROALBUMIN / CREATININE URINE RATIO
CREATININE, U: 58.6 mg/dL
MICROALB/CREAT RATIO: 1.2 mg/g (ref 0.0–30.0)

## 2016-08-26 NOTE — Progress Notes (Signed)
Patient ID: Joel Webb, male   DOB: 04-Mar-1946, 70 y.o.   MRN: [id number]           Reason for Appointment: Follow-up for Type 2 Diabetes  Referring physician: Moreen Fowler  History of Present Illness:          Date of diagnosis of type 2 diabetes mellitus: 2007       Background history:   He is unclear when he was diagnosed to have diabetes but was not very symptomatic He had been on metformin alone for several years with reportedly good control Also had previously been very consistent with exercise and diet His blood sugars probably started going up 3-4 years ago and he was given Lantus insulin in addition  Recent history:   INSULIN regimen is: Lantus  50 units in p.m.   He was seen in consultation in 4/17 with an A1c of 8.4 and he was started Trulicity in addition to his Lantus and metformin This was stopped in 7/17 because of high out-of-pocket expense He is now on Invokana  His A1c was 7% in 7/17 and is now 6.9  Current blood sugar patterns and problems identified:  He has check blood sugars mostly in the mornings fasting despite reminders to check them after meals  Not taking any mealtime insulin but A1c is similar to when he was on Trulicity   He is dying to be active but not doing much formal exercise  Usually watching and being consistent with diet  He has had no weight change  Non-insulin hypoglycemic drugs the patient is taking are: metformin 1 g twice a day     Side effects from medications have been: None  Compliance with the medical regimen: fair   Hypoglycemia: none    Glucose monitoring:  done 0.4 times a day         Glucometer:  Accu-Chek Blood Glucose readings by time of day and averages from meter download:  Mean values apply above for all meters except median for One Touch  PRE-MEAL Fasting Lunch Dinner Bedtime Overall  Glucose range: 136  150  79, 1 64    Mean/median: 119    118    Self-care: The diet that the patient has been following  is: tries to limit High-fat foods .                 Dietician visit, most recent:6/17               Exercise:  doing some treadmill and walking while working   Weight history:  Wt Readings from Last 3 Encounters:  08/26/16 183 lb 9.6 oz (83.3 kg)  06/26/16 184 lb (83.5 kg)  05/29/16 183 lb (83 kg)    Glycemic control:   Lab Results  Component Value Date   HGBA1C 6.9 (H) 08/21/2016   HGBA1C 7.0 (H) 05/24/2016   HGBA1C 8.4 02/02/2016   Lab Results  Component Value Date   LDLCALC 97 02/02/2016   CREATININE 1.57 (H) 08/21/2016   No results found for: MICRALBCREAT   Lab on 08/21/2016  Component Date Value Ref Range Status  . Hgb A1c MFr Bld 08/21/2016 6.9* 4.6 - 6.5 % Final  . Sodium 08/21/2016 141  135 - 145 mEq/L Final  . Potassium 08/21/2016 4.9  3.5 - 5.1 mEq/L Final  . Chloride 08/21/2016 107  96 - 112 mEq/L Final  . CO2 08/21/2016 26  19 - 32 mEq/L Final  . Glucose, Bld 08/21/2016 141* 70 -  99 mg/dL Final  . BUN 08/21/2016 30* 6 - 23 mg/dL Final  . Creatinine, Ser 08/21/2016 1.57* 0.40 - 1.50 mg/dL Final  . Calcium 08/21/2016 9.2  8.4 - 10.5 mg/dL Final  . GFR 08/21/2016 46.67* >60.00 mL/min Final        Medication List       Accurate as of 08/26/16  1:16 PM. Always use your most recent med list.          aspirin 81 MG chewable tablet Chew by mouth daily. Every other day   atorvastatin 80 MG tablet Commonly known as:  LIPITOR Take 80 mg by mouth daily.   B-D INS SYRINGE 0.5CC/30GX1/2" 30G X 1/2" 0.5 ML Misc Generic drug:  Insulin Syringe-Needle U-100   canagliflozin 100 MG Tabs tablet Commonly known as:  INVOKANA 1 tablet before breakfast   fenofibrate 160 MG tablet Take 160 mg by mouth daily.   FLUCELVAX QUADRIVALENT 0.5 ML Susy Generic drug:  Influenza Vac Subunit Quad   insulin glargine 100 UNIT/ML injection Commonly known as:  LANTUS Inject 0.5 mLs (50 Units total) into the skin at bedtime. 50 UNITS AT NIGHT     losartan-hydrochlorothiazide 100-12.5 MG tablet Commonly known as:  HYZAAR Take 1 tablet by mouth daily.   metFORMIN 1000 MG tablet Commonly known as:  GLUCOPHAGE Take 1,000 mg by mouth 2 (two) times daily with a meal.   multivitamin with minerals Tabs tablet Take 1 tablet by mouth daily.   omeprazole 20 MG capsule Commonly known as:  PRILOSEC Take 20 mg by mouth every other day. PRN   Pen Needles 31G X 6 MM Misc Use to inject insulin daily.   tamsulosin 0.4 MG Caps capsule Commonly known as:  FLOMAX Take 0.4 mg by mouth. Reported on 05/29/2016       Allergies: No Known Allergies  Past Medical History:  Diagnosis Date  . Diabetes mellitus without complication ([place])   . ED (erectile dysfunction)   . Hyperlipidemia   . Hypertension   . Plantar fasciitis   . Prostatitis     Past Surgical History:  Procedure Laterality Date  . CIRCUMCISION    . COLONOSCOPY      Family History  Problem Relation Age of Onset  . Hepatitis C Mother   . Mesothelioma Father   . Diabetes Father   . Heart disease Father     Social History:  reports that he has quit smoking. He does not have any smokeless tobacco history on file. He reports that he drinks alcohol. He reports that he does not use drugs.    Review of Systems    Lipid history: Has been on Lipitor, has triglycerides Measuring 259 in March His lipids are followed by PCP    Lab Results  Component Value Date   HDL 28 (A) 02/02/2016   LDLCALC 97 02/02/2016           Hypertension:Long-standing, treated with Hyzaar 1/2 tab, Dose was reduced when starting Invokana  Most recent eye exam was in 11/16  Most recent foot exam: 3/17  Review of Systems    Physical Examination:  BP 125/75   Pulse 60   Temp 97.9 F (36.6 C)   Resp 14   Ht 5\' 10"  (1.778 m)   Wt 183 lb 9.6 oz (83.3 kg)   SpO2 97%   BMI 26.34 kg/m        ASSESSMENT:  Diabetes type 2, BMI 26 See history of present illness for  discussion  of  current diabetes management, blood sugar patterns and problems identified  Currently taking Invokana along with  his basal insulin and metformin  Although his blood sugars are overall fairly good in the morning not clear if he has higher readings after meals as he does not monitor However A1c of 6.9 indicates overall good control   HYPERTENSION: Still well controlled, Hyzaar has been reduced with adding Invokana  PLAN:     He will continue the same treatment including Lantus insulin as he thinks Toujeo will be more expensive.  May need to adjust the dose of Lantus if fasting readings are out of range  He will check blood sugars at night  even if it is 1 hour after eating  Encouraged him to start walking regularly  Patient Instructions  Check sometimes after supper  Walk daily     Ikia Cincotta 08/26/2016, 1:16 PM   Note: This office note was prepared with Estate agent. Any transcriptional errors that result from this process are unintentional.

## 2016-08-26 NOTE — Patient Instructions (Signed)
Check sometimes after supper  Walk daily

## 2016-09-26 ENCOUNTER — Encounter: Payer: Self-pay | Admitting: Endocrinology

## 2016-09-26 ENCOUNTER — Other Ambulatory Visit: Payer: Self-pay

## 2016-09-26 MED ORDER — CANAGLIFLOZIN 100 MG PO TABS: ORAL_TABLET | ORAL | 3 refills | Status: DC

## 2016-10-14 ENCOUNTER — Encounter: Payer: Self-pay | Admitting: Endocrinology

## 2016-10-14 ENCOUNTER — Other Ambulatory Visit: Payer: Self-pay

## 2016-10-14 DIAGNOSIS — Z0000 Encounter for general adult medical examination without abnormal findings: Secondary | ICD-10-CM | POA: Diagnosis not present

## 2016-10-14 DIAGNOSIS — K219 Gastro-esophageal reflux disease without esophagitis: Secondary | ICD-10-CM | POA: Diagnosis not present

## 2016-10-14 DIAGNOSIS — I10 Essential (primary) hypertension: Secondary | ICD-10-CM | POA: Diagnosis not present

## 2016-10-14 DIAGNOSIS — N402 Nodular prostate without lower urinary tract symptoms: Secondary | ICD-10-CM | POA: Diagnosis not present

## 2016-10-14 DIAGNOSIS — N400 Enlarged prostate without lower urinary tract symptoms: Secondary | ICD-10-CM | POA: Diagnosis not present

## 2016-10-14 DIAGNOSIS — Z125 Encounter for screening for malignant neoplasm of prostate: Secondary | ICD-10-CM | POA: Diagnosis not present

## 2016-10-14 DIAGNOSIS — E1122 Type 2 diabetes mellitus with diabetic chronic kidney disease: Secondary | ICD-10-CM | POA: Diagnosis not present

## 2016-10-14 DIAGNOSIS — N183 Chronic kidney disease, stage 3 (moderate): Secondary | ICD-10-CM | POA: Diagnosis not present

## 2016-10-14 DIAGNOSIS — E782 Mixed hyperlipidemia: Secondary | ICD-10-CM | POA: Diagnosis not present

## 2016-10-14 DIAGNOSIS — N529 Male erectile dysfunction, unspecified: Secondary | ICD-10-CM | POA: Diagnosis not present

## 2016-10-14 MED ORDER — GLUCOSE BLOOD VI STRP: ORAL_STRIP | 3 refills | Status: DC

## 2016-10-14 MED ORDER — CANAGLIFLOZIN 100 MG PO TABS: ORAL_TABLET | ORAL | 3 refills | Status: DC

## 2016-10-14 MED ORDER — METFORMIN HCL 1000 MG PO TABS: 1000.0000 mg | ORAL_TABLET | Freq: Two times a day (BID) | ORAL | 2 refills | Status: DC

## 2016-10-24 ENCOUNTER — Encounter: Payer: Self-pay | Admitting: Endocrinology

## 2016-10-31 ENCOUNTER — Encounter: Payer: Self-pay | Admitting: Endocrinology

## 2016-11-01 ENCOUNTER — Other Ambulatory Visit: Payer: Self-pay

## 2016-11-01 MED ORDER — INSULIN GLARGINE 100 UNIT/ML ~~LOC~~ SOLN: 50.0000 [IU] | Freq: Every day | SUBCUTANEOUS | 2 refills | Status: DC

## 2016-11-27 ENCOUNTER — Encounter: Payer: Self-pay | Admitting: Endocrinology

## 2016-12-02 ENCOUNTER — Encounter: Payer: Self-pay | Admitting: Endocrinology

## 2016-12-24 ENCOUNTER — Other Ambulatory Visit (INDEPENDENT_AMBULATORY_CARE_PROVIDER_SITE_OTHER): Payer: Medicare HMO

## 2016-12-24 DIAGNOSIS — E1165 Type 2 diabetes mellitus with hyperglycemia: Secondary | ICD-10-CM

## 2016-12-24 DIAGNOSIS — Z794 Long term (current) use of insulin: Secondary | ICD-10-CM | POA: Diagnosis not present

## 2016-12-24 LAB — COMPREHENSIVE METABOLIC PANEL
ALT: 25 U/L (ref 0–53)
AST: 20 U/L (ref 0–37)
Albumin: 4.4 g/dL (ref 3.5–5.2)
Alkaline Phosphatase: 43 U/L (ref 39–117)
BILIRUBIN TOTAL: 0.4 mg/dL (ref 0.2–1.2)
BUN: 36 mg/dL — ABNORMAL HIGH (ref 6–23)
CO2: 26 meq/L (ref 19–32)
CREATININE: 1.81 mg/dL — AB (ref 0.40–1.50)
Calcium: 9.8 mg/dL (ref 8.4–10.5)
Chloride: 103 mEq/L (ref 96–112)
GFR: 39.57 mL/min — ABNORMAL LOW (ref >60.00)
GLUCOSE: 119 mg/dL — AB (ref 70–99)
Potassium: 4.4 mEq/L (ref 3.5–5.1)
Sodium: 138 mEq/L (ref 135–145)
Total Protein: 7 g/dL (ref 6.0–8.3)

## 2016-12-24 LAB — LIPID PANEL
Cholesterol: 129 mg/dL (ref 0–200)
HDL: 23.7 mg/dL — ABNORMAL LOW (ref >39.00)
NONHDL: 105.62
Total CHOL/HDL Ratio: 5
Triglycerides: 210 mg/dL — ABNORMAL HIGH (ref 0.0–149.0)
VLDL: 42 mg/dL — ABNORMAL HIGH (ref 0.0–40.0)

## 2016-12-24 LAB — HEMOGLOBIN A1C: Hgb A1c MFr Bld: 7.6 % — ABNORMAL HIGH (ref 4.6–6.5)

## 2016-12-24 LAB — LDL CHOLESTEROL, DIRECT: LDL DIRECT: 75 mg/dL

## 2016-12-27 ENCOUNTER — Other Ambulatory Visit: Payer: Self-pay

## 2016-12-27 ENCOUNTER — Encounter: Payer: Self-pay | Admitting: Endocrinology

## 2016-12-27 ENCOUNTER — Ambulatory Visit (INDEPENDENT_AMBULATORY_CARE_PROVIDER_SITE_OTHER): Payer: Medicare HMO | Admitting: Endocrinology

## 2016-12-27 VITALS — BP 122/78 | HR 62 | Ht 69.0 in | Wt 187.0 lb

## 2016-12-27 DIAGNOSIS — E1165 Type 2 diabetes mellitus with hyperglycemia: Secondary | ICD-10-CM

## 2016-12-27 DIAGNOSIS — N289 Disorder of kidney and ureter, unspecified: Secondary | ICD-10-CM

## 2016-12-27 DIAGNOSIS — Z794 Long term (current) use of insulin: Secondary | ICD-10-CM

## 2016-12-27 DIAGNOSIS — I10 Essential (primary) hypertension: Secondary | ICD-10-CM

## 2016-12-27 MED ORDER — INSULIN REGULAR HUMAN 100 UNIT/ML IJ SOLN: INTRAMUSCULAR | 4 refills | Status: DC

## 2016-12-27 NOTE — Telephone Encounter (Signed)
[facility] is calling on the status the insulin regular (NOVOLIN R RELION) 250 units/2.43mL (100 units/mL) injection prescription that was sent.  They don't have it in stock  Please advise  [facility] [address] [place] [phone] (Phone) [phone] (Fax)

## 2016-12-27 NOTE — Patient Instructions (Addendum)
Stop Invokana, reduce, pm Metformin to 1/2  Relion Novolin R; 6 units at supper and adjust based on type of meal  Need to keep bedtime sugar 130-160

## 2016-12-27 NOTE — Progress Notes (Signed)
Patient ID: Joel Webb, male   DOB: [birth year], 71 y.o.   MRN: [id number]           Reason for Appointment: Follow-up for Type 2 Diabetes  Referring physician: Moreen Fowler  History of Present Illness:          Date of diagnosis of type 2 diabetes mellitus: 2007       Background history:   He is unclear when he was diagnosed to have diabetes but was not very symptomatic He had been on metformin alone for several years with reportedly good control Also had previously been very consistent with exercise and diet His blood sugars probably started going up 3-4 years ago and he was given Lantus insulin in addition  Recent history:   INSULIN regimen is: Lantus  50 units in p.m.  Non-insulin hypoglycemic drugs the patient is taking are: metformin 1 g twice a day, Invokana 100 mg daily      He was seen in consultation in 4/17 with an A1c of 8.4 and he was started Trulicity in addition to his Lantus and metformin This was stopped in 7/17 because of high out-of-pocket expense He is now on Invokana  His A1c was 6.9 previously and now gone up to 7.6  Current blood sugar patterns and problems identified:  He has relatively higher A1c but not clear why since his fasting readings are not consistently high  Again forgetting to check sugars after evening meal, has only one reading of 205.  He says he is normally not eating much carbohydrate at breakfast and when he is working he is not eating a proper meal at lunchtime  He has done a little exercise but not consistently and weight is going up  He is still concerned about the cost of brand name medications  Side effects from medications have been: None  Compliance with the medical regimen: fair   Hypoglycemia: none    Glucose monitoring:  done 0.4 times a day         Glucometer:  Accu-Chek Blood Glucose readings by time of day and averages from meter download:  Mean values apply above for all meters except median for One  Touch  PRE-MEAL Fasting Lunch Dinner Bedtime Overall  Glucose range: 95-149   76  205   Mean/median: 129        Self-care: The diet that the patient has been following is: tries to limit High-fat foods .                 Dietician visit, most recent:6/17               Exercise: doing some treadmill and walking while working   Weight history:  Wt Readings from Last 3 Encounters:  12/27/16 187 lb (84.8 kg)  08/26/16 183 lb 9.6 oz (83.3 kg)  06/26/16 184 lb (83.5 kg)    Glycemic control:   Lab Results  Component Value Date   HGBA1C 7.6 (H) 12/24/2016   HGBA1C 6.9 (H) 08/21/2016   HGBA1C 7.0 (H) 05/24/2016   Lab Results  Component Value Date   MICROALBUR <0.7 08/26/2016   LDLCALC 97 02/02/2016   CREATININE 1.81 (H) 12/24/2016   Lab Results  Component Value Date   MICRALBCREAT 1.2 08/26/2016    Other problems addressed today: See review of systems   Lab on 12/24/2016  Component Date Value Ref Range Status  . Hgb A1c MFr Bld 12/24/2016 7.6* 4.6 - 6.5 % Final  . Sodium  12/24/2016 138  135 - 145 mEq/L Final  . Potassium 12/24/2016 4.4  3.5 - 5.1 mEq/L Final  . Chloride 12/24/2016 103  96 - 112 mEq/L Final  . CO2 12/24/2016 26  19 - 32 mEq/L Final  . Glucose, Bld 12/24/2016 119* 70 - 99 mg/dL Final  . BUN 12/24/2016 36* 6 - 23 mg/dL Final  . Creatinine, Ser 12/24/2016 1.81* 0.40 - 1.50 mg/dL Final  . Total Bilirubin 12/24/2016 0.4  0.2 - 1.2 mg/dL Final  . Alkaline Phosphatase 12/24/2016 43  39 - 117 U/L Final  . AST 12/24/2016 20  0 - 37 U/L Final  . ALT 12/24/2016 25  0 - 53 U/L Final  . Total Protein 12/24/2016 7.0  6.0 - 8.3 g/dL Final  . Albumin 12/24/2016 4.4  3.5 - 5.2 g/dL Final  . Calcium 12/24/2016 9.8  8.4 - 10.5 mg/dL Final  . GFR 12/24/2016 39.57* >60.00 mL/min Final  . Cholesterol 12/24/2016 129  0 - 200 mg/dL Final  . Triglycerides 12/24/2016 210.0* 0.0 - 149.0 mg/dL Final  . HDL 12/24/2016 23.70* >39.00 mg/dL Final  . VLDL 12/24/2016 42.0* 0.0 -  40.0 mg/dL Final  . Total CHOL/HDL Ratio 12/24/2016 5   Final  . NonHDL 12/24/2016 105.62   Final  . Direct LDL 12/24/2016 75.0  mg/dL Final      Allergies as of 12/27/2016   No Known Allergies     Medication List       Accurate as of 12/27/16  9:14 AM. Always use your most recent med list.          aspirin 81 MG chewable tablet Chew by mouth daily. Every other day   atorvastatin 80 MG tablet Commonly known as:  LIPITOR Take 80 mg by mouth daily.   B-D INS SYRINGE 0.5CC/30GX1/2" 30G X 1/2" 0.5 ML Misc Generic drug:  Insulin Syringe-Needle U-100   canagliflozin 100 MG Tabs tablet Commonly known as:  INVOKANA 1 tablet before breakfast   fenofibrate 160 MG tablet Take 160 mg by mouth daily.   FLUCELVAX QUADRIVALENT 0.5 ML Susy Generic drug:  Influenza Vac Subunit Quad   glucose blood test strip Commonly known as:  ACCU-CHEK SMARTVIEW Use to check blood sugar 1 time per day.   insulin glargine 100 UNIT/ML injection Commonly known as:  LANTUS Inject 0.5 mLs (50 Units total) into the skin at bedtime. 50 UNITS AT NIGHT   insulin regular 250 units/2.67mL (100 units/mL) injection Commonly known as:  NOVOLIN R Inject 8 units at supper   losartan-hydrochlorothiazide 100-12.5 MG tablet Commonly known as:  HYZAAR Take 1 tablet by mouth daily.   metFORMIN 1000 MG tablet Commonly known as:  GLUCOPHAGE Take 1 tablet (1,000 mg total) by mouth 2 (two) times daily with a meal.   multivitamin with minerals Tabs tablet Take 1 tablet by mouth daily.   omeprazole 20 MG capsule Commonly known as:  PRILOSEC Take 20 mg by mouth every other day. PRN   Pen Needles 31G X 6 MM Misc Use to inject insulin daily.   tamsulosin 0.4 MG Caps capsule Commonly known as:  FLOMAX Take 0.4 mg by mouth. Reported on 05/29/2016       Allergies: No Known Allergies  Past Medical History:  Diagnosis Date  . Diabetes mellitus without complication ([place])   . ED (erectile dysfunction)   .  Hyperlipidemia   . Hypertension   . Plantar fasciitis   . Prostatitis     Past Surgical History:  Procedure Laterality Date  . CIRCUMCISION    . COLONOSCOPY      Family History  Problem Relation Age of Onset  . Hepatitis C Mother   . Mesothelioma Father   . Diabetes Father   . Heart disease Father     Social History:  reports that he has quit smoking. He has never used smokeless tobacco. He reports that he drinks alcohol. He reports that he does not use drugs.    Review of Systems    Lipid history: Has been on Lipitor, has triglycerides still over 200  His lipids are followed by PCP    Lab Results  Component Value Date   CHOL 129 12/24/2016   HDL 23.70 (L) 12/24/2016   LDLCALC 97 02/02/2016   LDLDIRECT 75.0 12/24/2016   TRIG 210.0 (H) 12/24/2016   CHOLHDL 5 12/24/2016           Hypertension:Long-standing, treated with Hyzaar And he is on his own taking the full tablet instead of half, he thinks his blood pressure was higher even with starting Invokana when he reduced the Hyzaar doses  He says he checks his blood pressure at home daily   BP Readings from Last 3 Encounters:  12/27/16 122/78  08/26/16 125/75  06/26/16 122/76     Most recent eye exam was in 11/16  Most recent foot exam: 3/17  Review of Systems    Physical Examination:  BP 122/78   Pulse 62   Ht 5\' 9"  (1.753 m)   Wt 187 lb (84.8 kg)   SpO2 97%   BMI 27.62 kg/m    No pedal edema present    ASSESSMENT:  Diabetes type 2, BMI 26 See history of present illness for  discussion of current diabetes management, blood sugar patterns and problems identified  His A1c is relatively higher at 7.6 This is likely to be from postprandial hyperglycemia especially after evening meal which he does not monitor Also has occasionally higher readings in the mornings  Currently taking Invokana but his creatinine is now 1.8 Etiology of this is unclear Discussed that it will been effective with his  creatinine clearance below 45  RENAL dysfunction: This is new, he has been on Invokana since 7/17 and not clear why the renal function has changed  HYPERTENSION:  well controlled, Hyzaar Dose is currently 100/12.5  PLAN:     He will need to start mealtime insulin at suppertime  Today discussed in detail the need for mealtime insulin to cover postprandial spikes, action of mealtime insulin, use of the insulin pen, timing and action of the rapid acting insulin as well as starting dose and dosage titration to target the two-hour reading of under 180 or bedtime reading of 160.  He will have to start checking his blood sugars after supper regularly now  He will need to stop his Invokana for now  Reduce evening metformin by half tablet because of renal dysfunction  Continue Lantus for now  Although up in 6 weeks to review home readings and recheck renal function  Also discuss RENAL dysfunction with his PCP  Encouraged him to start walking regularly either outside or at his home on the treadmill  Patient Instructions  Stop Invokana, reduce, pm Metformin to 1/2  Relion Novolin R; 6 units at supper and adjust based on type of meal  Need to keep bedtime sugar 130-160       Counseling time on subjects discussed above is over 50% of today's 25 minute visit  Amar Sippel 12/27/2016, 9:14 AM   Note: This office note was prepared with Dragon voice recognition system technology. Any transcriptional errors that result from this process are unintentional.

## 2016-12-30 DIAGNOSIS — E782 Mixed hyperlipidemia: Secondary | ICD-10-CM | POA: Diagnosis not present

## 2016-12-30 DIAGNOSIS — K219 Gastro-esophageal reflux disease without esophagitis: Secondary | ICD-10-CM | POA: Diagnosis not present

## 2016-12-30 DIAGNOSIS — I10 Essential (primary) hypertension: Secondary | ICD-10-CM | POA: Diagnosis not present

## 2016-12-30 DIAGNOSIS — F329 Major depressive disorder, single episode, unspecified: Secondary | ICD-10-CM | POA: Diagnosis not present

## 2016-12-30 DIAGNOSIS — N183 Chronic kidney disease, stage 3 (moderate): Secondary | ICD-10-CM | POA: Diagnosis not present

## 2016-12-30 DIAGNOSIS — E1122 Type 2 diabetes mellitus with diabetic chronic kidney disease: Secondary | ICD-10-CM | POA: Diagnosis not present

## 2017-01-06 DIAGNOSIS — I10 Essential (primary) hypertension: Secondary | ICD-10-CM | POA: Diagnosis not present

## 2017-01-07 ENCOUNTER — Other Ambulatory Visit: Payer: Medicare HMO

## 2017-01-14 ENCOUNTER — Encounter: Payer: Self-pay | Admitting: Endocrinology

## 2017-01-21 ENCOUNTER — Encounter: Payer: Self-pay | Admitting: Endocrinology

## 2017-01-22 ENCOUNTER — Other Ambulatory Visit: Payer: Self-pay | Admitting: Endocrinology

## 2017-01-22 NOTE — Telephone Encounter (Signed)
Refill is needed on metformin please called into [facility] on [address]  Pt also needs refill on lantus too

## 2017-02-03 ENCOUNTER — Other Ambulatory Visit: Payer: Self-pay

## 2017-02-03 MED ORDER — METFORMIN HCL 1000 MG PO TABS: ORAL_TABLET | ORAL | 1 refills | Status: DC

## 2017-02-04 ENCOUNTER — Other Ambulatory Visit (INDEPENDENT_AMBULATORY_CARE_PROVIDER_SITE_OTHER): Payer: Medicare HMO

## 2017-02-04 ENCOUNTER — Other Ambulatory Visit: Payer: Self-pay

## 2017-02-04 DIAGNOSIS — Z794 Long term (current) use of insulin: Secondary | ICD-10-CM

## 2017-02-04 DIAGNOSIS — E1165 Type 2 diabetes mellitus with hyperglycemia: Secondary | ICD-10-CM | POA: Diagnosis not present

## 2017-02-04 LAB — BASIC METABOLIC PANEL
BUN: 22 mg/dL (ref 6–23)
CHLORIDE: 108 meq/L (ref 96–112)
CO2: 27 meq/L (ref 19–32)
CREATININE: 1.36 mg/dL (ref 0.40–1.50)
Calcium: 9.7 mg/dL (ref 8.4–10.5)
GFR: 55.01 mL/min — ABNORMAL LOW (ref >60.00)
GLUCOSE: 133 mg/dL — AB (ref 70–99)
Potassium: 4.7 mEq/L (ref 3.5–5.1)
Sodium: 142 mEq/L (ref 135–145)

## 2017-02-05 DIAGNOSIS — H903 Sensorineural hearing loss, bilateral: Secondary | ICD-10-CM | POA: Diagnosis not present

## 2017-02-05 LAB — FRUCTOSAMINE: FRUCTOSAMINE: 233 umol/L (ref 0–285)

## 2017-02-07 ENCOUNTER — Encounter: Payer: Self-pay | Admitting: Endocrinology

## 2017-02-07 ENCOUNTER — Ambulatory Visit (INDEPENDENT_AMBULATORY_CARE_PROVIDER_SITE_OTHER): Payer: Medicare HMO | Admitting: Endocrinology

## 2017-02-07 VITALS — BP 150/84 | HR 74 | Ht 69.0 in | Wt 189.0 lb

## 2017-02-07 DIAGNOSIS — I10 Essential (primary) hypertension: Secondary | ICD-10-CM

## 2017-02-07 DIAGNOSIS — Z794 Long term (current) use of insulin: Secondary | ICD-10-CM

## 2017-02-07 DIAGNOSIS — E1165 Type 2 diabetes mellitus with hyperglycemia: Secondary | ICD-10-CM | POA: Diagnosis not present

## 2017-02-07 NOTE — Progress Notes (Signed)
Patient ID: Joel Webb, male   DOB: [birth year], 71 y.o.   MRN: [id number]           Reason for Appointment: Follow-up for Type 2 Diabetes  Referring physician: Moreen Fowler  History of Present Illness:          Date of diagnosis of type 2 diabetes mellitus: 2007       Background history:   He is unclear when he was diagnosed to have diabetes but was not very symptomatic He had been on metformin alone for several years with reportedly good control Also had previously been very consistent with exercise and diet His blood sugars probably started going up 3-4 years ago and he was given Lantus insulin in addition  Recent history:   INSULIN regimen is: Lantus  50 units in p.m., Novolin R 8 units at supper Non-insulin hypoglycemic drugs the patient is taking are: metformin 1 g twice a day  He was seen in consultation in 4/17 with an A1c of 8.4 and he was started Trulicity in addition to his Lantus and metformin This was stopped in 7/17 because of high out-of-pocket expense He is now off Invokana because of renal dysfunction February  His A1c was 7.6 in 2/18 Fructosamine is 233  Current blood sugar patterns and problems identified:  He was told to start taking regular insulin at suppertime on his last visit because of readings around 200 after supper but he says he tends to forget this even though he has a reminder on his refrigerator  He is also occasionally forgetting his Lantus  Did not bring his monitor for download and not clear what his blood sugar patterns are  Although he has tried to exercise his weight has gone up slightly  Side effects from medications have been: None  Compliance with the medical regimen: fair   Hypoglycemia: none    Glucose monitoring:  done less than 1 times a day         Glucometer:  Accu-Chek Blood Glucose readings by recall  Mean values apply above for all meters except median for One Touch  PRE-MEAL Fasting Lunch Dinner Bedtime Overall    Glucose range: 120-130   ?     Mean/median:        Self-care: The diet that the patient has been following is: tries to limit High-fat foods .                 Dietician visit, most recent:6/17               Exercise: doing some [facility]mill[facility] and [facility]   Weight history:  Wt Readings from Last 3 Encounters:  02/07/17 189 lb (85.7 kg)  12/27/16 187 lb (84.8 kg)  08/26/16 183 lb 9.6 oz (83.3 kg)    Glycemic control:   Lab Results  Component Value Date   HGBA1C 7.6 (H) 12/24/2016   HGBA1C 6.9 (H) 08/21/2016   HGBA1C 7.0 (H) 05/24/2016   Lab Results  Component Value Date   MICROALBUR <0.7 08/26/2016   LDLCALC 97 02/02/2016   CREATININE 1.36 02/04/2017   Lab Results  Component Value Date   MICRALBCREAT 1.2 08/26/2016    Lab Results  Component Value Date   FRUCTOSAMINE 233 02/04/2017   FRUCTOSAMINE 229 06/25/2016   FRUCTOSAMINE 234 05/24/2016     Other problems addressed today: See review of systems   Lab on 02/04/2017  Component Date Value Ref Range Status  .  Sodium 02/04/2017 142  135 - 145 mEq/L Final  . Potassium 02/04/2017 4.7  3.5 - 5.1 mEq/L Final  . Chloride 02/04/2017 108  96 - 112 mEq/L Final  . CO2 02/04/2017 27  19 - 32 mEq/L Final  . Glucose, Bld 02/04/2017 133* 70 - 99 mg/dL Final  . BUN 02/04/2017 22  6 - 23 mg/dL Final  . Creatinine, Ser 02/04/2017 1.36  0.40 - 1.50 mg/dL Final  . Calcium 02/04/2017 9.7  8.4 - 10.5 mg/dL Final  . GFR 02/04/2017 55.01* >60.00 mL/min Final  . Fructosamine 02/04/2017 233  0 - 285 umol/L Final   Comment: Published reference interval for apparently healthy subjects between age 60 and 61 is 55 - 285 umol/L and in a poorly controlled diabetic population is 228 - 563 umol/L with a mean of 396 umol/L.       Allergies as of 02/07/2017   No Known Allergies     Medication List       Accurate as of 02/07/17 12:00 PM. Always use your most recent med list.          aspirin 81 MG  chewable tablet Chew by mouth daily. Every other day   atorvastatin 80 MG tablet Commonly known as:  LIPITOR Take 80 mg by mouth daily.   B-D INS SYRINGE 0.5CC/30GX1/2" 30G X 1/2" 0.5 ML Misc Generic drug:  Insulin Syringe-Needle U-100   canagliflozin 100 MG Tabs tablet Commonly known as:  INVOKANA 1 tablet before breakfast   fenofibrate 160 MG tablet Take 160 mg by mouth daily.   FLUCELVAX QUADRIVALENT 0.5 ML Susy Generic drug:  Influenza Vac Subunit Quad   glucose blood test strip Commonly known as:  ACCU-CHEK SMARTVIEW Use to check blood sugar 1 time per day.   insulin glargine 100 UNIT/ML injection Commonly known as:  LANTUS Inject 0.5 mLs (50 Units total) into the skin at bedtime. 50 UNITS AT NIGHT   insulin regular 250 units/2.55mL (100 units/mL) injection Commonly known as:  NOVOLIN R RELION Inject 8 units at supper   losartan-hydrochlorothiazide 100-12.5 MG tablet Commonly known as:  HYZAAR Take 1 tablet by mouth daily.   metFORMIN 1000 MG tablet Commonly known as:  GLUCOPHAGE TAKE ONE TABLET BY MOUTH TWICE A DAY WITH A MEAL   multivitamin with minerals Tabs tablet Take 1 tablet by mouth daily.   omeprazole 20 MG capsule Commonly known as:  PRILOSEC Take 20 mg by mouth every other day. PRN   Pen Needles 31G X 6 MM Misc Use to inject insulin daily.   tamsulosin 0.4 MG Caps capsule Commonly known as:  FLOMAX Take 0.4 mg by mouth. Reported on 05/29/2016       Allergies: No Known Allergies  Past Medical History:  Diagnosis Date  . Diabetes mellitus without complication ([place])   . ED (erectile dysfunction)   . Hyperlipidemia   . Hypertension   . Plantar fasciitis   . Prostatitis     Past Surgical History:  Procedure Laterality Date  . CIRCUMCISION    . COLONOSCOPY      Family History  Problem Relation Age of Onset  . Hepatitis C Mother   . Mesothelioma Father   . Diabetes Father   . Heart disease Father     Social History:  reports  that he has quit smoking. He has never used smokeless tobacco. He reports that he drinks alcohol. He reports that he does not use drugs.    Review of Systems  RENAL dysfunction: This has improved with stopping Invokana  Lab Results  Component Value Date   CREATININE 1.36 02/04/2017   BUN 22 02/04/2017   NA 142 02/04/2017   K 4.7 02/04/2017   CL 108 02/04/2017   CO2 27 02/04/2017    Lipid history: Has been on Lipitor, has triglycerides still over 200  His lipids are followed by PCP    Lab Results  Component Value Date   CHOL 129 12/24/2016   HDL 23.70 (L) 12/24/2016   LDLCALC 97 02/02/2016   LDLDIRECT 75.0 12/24/2016   TRIG 210.0 (H) 12/24/2016   CHOLHDL 5 12/24/2016           Hypertension:Long-standing, treated with Hyzaar  Recently his blood pressure had been higher and his PCP started him on 2.5 mg amlodipine However blood pressure has been still higher at home around 161-096/04 diastolic and today he was told to start taking 5 mg amlodipine   BP Readings from Last 3 Encounters:  02/07/17 (!) 150/84  12/27/16 122/78  08/26/16 125/75     Most recent eye exam was in 11/16  Most recent foot exam: 3/17  Review of Systems    Physical Examination:  BP (!) 150/84 (Cuff Size: Normal)   Pulse 74   Ht 5\' 9"  (1.753 m)   Wt 189 lb (85.7 kg)   SpO2 96%   BMI 27.91 kg/m    No pedal edema present    ASSESSMENT:  Diabetes type 2, BMI 26 See history of present illness for  discussion of current diabetes management, blood sugar patterns and problems identified  Currently he is on metformin and basal bolus insulin However has difficulty remembering to take his insulin injections especially the regular insulin before supper However with stopping Invokana his fructosamine does not indicate significantly high readings Since he did not bring his monitor not clear how often he is getting high readings after supper with his irregular compliance with insulin  especially at meal times He is trying to exercise more regularly which may be helping  RENAL dysfunction: This is resolving with stopping Invokana, may be related to combination of Invokana and losartan  HYPERTENSION:  Now not well controlled, his PCP has started amlodipine and he is just going up to the 5 mg dose  PLAN:     He was shown the V-go pump and discussed in detail how this is used for delivering basal and bolus insulin  He is interested in trying this to help with simplifying  his regimen and helping him remember to do his mealtime injections as well as Lantus.  He will look into the insurance coverage  Also he can continue to use the Novolin regular insulin for the V-go pump, he will use the 40 units pump and take 4-6 units before each meal that has carbohydrate.  Also he can try the sample before he decides to get the prescription of the pumps  In the meantime he will try to take his regular insulin more consistently before supper  He will follow-up with his PCP regarding his blood pressure medications  Encouraged him to check his sugars more consistently after meals and bring monitor for download on each visit  He will follow-up within a week after starting the pump  There are no Patient Instructions on file for this visit.  Counseling time on subjects discussed above is over 50% of today's 25 minute visit  Dalexa Gentz 02/07/2017, 12:00 PM   Note: This office note was prepared with  Dragon [employer] therapist. Any transcriptional errors that result from this process are unintentional.

## 2017-02-09 ENCOUNTER — Encounter: Payer: Self-pay | Admitting: Endocrinology

## 2017-02-16 ENCOUNTER — Encounter: Payer: Self-pay | Admitting: Endocrinology

## 2017-02-18 ENCOUNTER — Other Ambulatory Visit: Payer: Self-pay

## 2017-02-18 ENCOUNTER — Encounter: Payer: Self-pay | Admitting: Endocrinology

## 2017-02-18 MED ORDER — METFORMIN HCL 1000 MG PO TABS: ORAL_TABLET | ORAL | 3 refills | Status: DC

## 2017-02-19 ENCOUNTER — Encounter: Payer: Medicare HMO | Admitting: Nutrition

## 2017-02-19 ENCOUNTER — Encounter: Payer: Self-pay | Admitting: Endocrinology

## 2017-02-20 ENCOUNTER — Other Ambulatory Visit: Payer: Self-pay

## 2017-02-20 NOTE — Telephone Encounter (Signed)
Please schedule patient for V-go pump start with Vaugh[facility] Basta and also make sure his insurance verification has been faxed.  Please call patient who had emailed yesterday

## 2017-03-04 ENCOUNTER — Encounter: Payer: Medicare HMO | Attending: Endocrinology | Admitting: Nutrition

## 2017-03-04 DIAGNOSIS — E1165 Type 2 diabetes mellitus with hyperglycemia: Secondary | ICD-10-CM

## 2017-03-04 DIAGNOSIS — Z713 Dietary counseling and surveillance: Secondary | ICD-10-CM | POA: Diagnosis not present

## 2017-03-04 DIAGNOSIS — Z794 Long term (current) use of insulin: Secondary | ICD-10-CM

## 2017-03-04 DIAGNOSIS — E119 Type 2 diabetes mellitus without complications: Secondary | ICD-10-CM | POA: Insufficient documentation

## 2017-03-04 NOTE — Patient Instructions (Signed)
Fill and apply a new V-go every day. Stop all lantus insulin Give 2-3 button presses before each meal. Test blood sugars before meals and at bedtime.

## 2017-03-04 NOTE — Progress Notes (Signed)
Pt. Was trained on how to fill, apply, and use the V-go.  He did not bring his R insulin, and we did not have any samples here.  He redemonstrated how to fill the V-Go and will start this tonight at [facility]. (He [facility] at [facility]) He was given a V-go 40 starter kit with 6 v-gos and directions for how to fill, apply, use and remove the V-go.  Written instructions were given to take 2-3 button presses before each meal.  He was instructed to stop all Lantus and reported good understanding of this. We discussed the need to test blood sugars before meals and at bedtime, and to bring the record back to see Dr. Dwyane Dee when he returns on Thursday. He was given a sheet to record his blood sugars. He hand no questions.

## 2017-03-06 ENCOUNTER — Encounter: Payer: Self-pay | Admitting: Endocrinology

## 2017-03-06 ENCOUNTER — Ambulatory Visit (INDEPENDENT_AMBULATORY_CARE_PROVIDER_SITE_OTHER): Payer: Medicare HMO | Admitting: Endocrinology

## 2017-03-06 VITALS — BP 112/78 | HR 79 | Ht 69.0 in | Wt 187.0 lb

## 2017-03-06 DIAGNOSIS — E1165 Type 2 diabetes mellitus with hyperglycemia: Secondary | ICD-10-CM | POA: Diagnosis not present

## 2017-03-06 DIAGNOSIS — Z794 Long term (current) use of insulin: Secondary | ICD-10-CM

## 2017-03-06 NOTE — Patient Instructions (Signed)
Clicks 2-8-3 before meals  Change EVERY 24 HRS

## 2017-03-06 NOTE — Progress Notes (Signed)
Patient ID: Joel Webb, male   DOB: 1946/10/18, 71 y.o.   MRN: [id number]           Reason for Appointment: Follow-up for Type 2 Diabetes  Referring physician: Moreen Fowler  History of Present Illness:          Date of diagnosis of type 2 diabetes mellitus: 2007       Background history:   He is unclear when he was diagnosed to have diabetes but was not very symptomatic He had been on metformin alone for several years with reportedly good control Also had previously been very consistent with exercise and diet His blood sugars probably started going up 3-4 years ago and he was given Lantus insulin in addition He was seen in consultation in 4/17 with an A1c of 8.4 and he was started Trulicity in addition to his Lantus and metformin This was stopped in 7/17 because of high out-of-pocket expense  Recent history:   INSULIN regimen is: V-go pump, 40 units basal, boluses 1-2 clicks at meals  Previous regimen: Lantus  50 units in p.m., Novolin R 8 units at supper Non-insulin hypoglycemic drugs the patient is taking are: metformin 1 g twice a day  His A1c was 7.6 in 2/18  Current blood sugar patterns and problems identified:  He was told to start the V-go pump on Tuesday but since he had taken his Lantus he started this only yesterday  This was done because of his tending to forget to take his mealtime insulin in the evening at dinnertime  Also appears that he has not understood the directions for use of the V-go pump completely and instead of doing 2 clicks at breakfast he has taken only 1.  Also has not changed his V-go pump today because of having some insulin left over  He says that he is sometimes busy in the morning and is not able to find time to change his pump in the morning  Did not bring his monitor for download and not clear what his blood sugar readings are, also he has not checked his sugars except in the morning since yesterday  Side effects from medications have  been: None  Compliance with the medical regimen: fair   Hypoglycemia: none    Glucose monitoring:  done less than 1 times a day         Glucometer:  Accu-Chek Blood Glucose readings by recall:  Mean values apply above for all meters except median for One Touch  PRE-MEAL Fasting Lunch Dinner Bedtime Overall  Glucose range: 126, 105   ?     Mean/median:        Self-care: The diet that the patient has been following is: tries to limit High-fat foods .                 Dietician visit, most recent:6/17               Exercise: doing [facility]   Weight history:  Wt Readings from Last 3 Encounters:  03/06/17 187 lb (84.8 kg)  02/07/17 189 lb (85.7 kg)  12/27/16 187 lb (84.8 kg)    Glycemic control:   Lab Results  Component Value Date   HGBA1C 7.6 (H) 12/24/2016   HGBA1C 6.9 (H) 08/21/2016   HGBA1C 7.0 (H) 05/24/2016   Lab Results  Component Value Date   MICROALBUR <0.7 08/26/2016   LDLCALC 97 02/02/2016   CREATININE 1.36  02/04/2017   Lab Results  Component Value Date   MICRALBCREAT 1.2 08/26/2016    Lab Results  Component Value Date   FRUCTOSAMINE 233 02/04/2017   FRUCTOSAMINE 229 06/25/2016   FRUCTOSAMINE 234 05/24/2016     Other problems addressed today: See review of systems   No visits with results within 1 Week(s) from this visit.  Latest known visit with results is:  Lab on 02/04/2017  Component Date Value Ref Range Status  . Sodium 02/04/2017 142  135 - 145 mEq/L Final  . Potassium 02/04/2017 4.7  3.5 - 5.1 mEq/L Final  . Chloride 02/04/2017 108  96 - 112 mEq/L Final  . CO2 02/04/2017 27  19 - 32 mEq/L Final  . Glucose, Bld 02/04/2017 133* 70 - 99 mg/dL Final  . BUN 02/04/2017 22  6 - 23 mg/dL Final  . Creatinine, Ser 02/04/2017 1.36  0.40 - 1.50 mg/dL Final  . Calcium 02/04/2017 9.7  8.4 - 10.5 mg/dL Final  . GFR 02/04/2017 55.01* >60.00 mL/min Final  . Fructosamine 02/04/2017 233  0 - 285 umol/L  Final   Comment: Published reference interval for apparently healthy subjects between age 35 and 50 is 49 - 285 umol/L and in a poorly controlled diabetic population is 228 - 563 umol/L with a mean of 396 umol/L.       Allergies as of 03/06/2017   No Known Allergies     Medication List       Accurate as of 03/06/17  3:30 PM. Always use your most recent med list.          aspirin 81 MG chewable tablet Chew by mouth daily. Every other day   atorvastatin 80 MG tablet Commonly known as:  LIPITOR Take 80 mg by mouth daily.   B-D INS SYRINGE 0.5CC/30GX1/2" 30G X 1/2" 0.5 ML Misc Generic drug:  Insulin Syringe-Needle U-100   fenofibrate 160 MG tablet Take 160 mg by mouth daily.   FLUCELVAX QUADRIVALENT 0.5 ML Susy Generic drug:  Influenza Vac Subunit Quad   glucose blood test strip Commonly known as:  ACCU-CHEK SMARTVIEW Use to check blood sugar 1 time per day.   insulin glargine 100 UNIT/ML injection Commonly known as:  LANTUS Inject 0.5 mLs (50 Units total) into the skin at bedtime. 50 UNITS AT NIGHT   insulin regular 250 units/2.76mL (100 units/mL) injection Commonly known as:  NOVOLIN R RELION Inject 8 units at supper   losartan-hydrochlorothiazide 100-12.5 MG tablet Commonly known as:  HYZAAR Take 1 tablet by mouth daily.   metFORMIN 1000 MG tablet Commonly known as:  GLUCOPHAGE TAKE ONE TABLET BY MOUTH TWICE A DAY WITH A MEAL   multivitamin with minerals Tabs tablet Take 1 tablet by mouth daily.   omeprazole 20 MG capsule Commonly known as:  PRILOSEC Take 20 mg by mouth every other day. PRN   Pen Needles 31G X 6 MM Misc Use to inject insulin daily.   tamsulosin 0.4 MG Caps capsule Commonly known as:  FLOMAX Take 0.4 mg by mouth. Reported on 05/29/2016       Allergies: No Known Allergies  Past Medical History:  Diagnosis Date  . Diabetes mellitus without complication ([place])   . ED (erectile dysfunction)   . Hyperlipidemia   . Hypertension     . Plantar fasciitis   . Prostatitis     Past Surgical History:  Procedure Laterality Date  . CIRCUMCISION    . COLONOSCOPY      Family History  Problem Relation Age of Onset  . Hepatitis C Mother   . Mesothelioma Father   . Diabetes Father   . Heart disease Father     Social History:  reports that he has quit smoking. He has never used smokeless tobacco. He reports that he drinks alcohol. He reports that he does not use drugs.    Review of Systems   The following is a copy of previous note   RENAL dysfunction: This has improved with stopping Invokana  Lab Results  Component Value Date   CREATININE 1.36 02/04/2017   BUN 22 02/04/2017   NA 142 02/04/2017   K 4.7 02/04/2017   CL 108 02/04/2017   CO2 27 02/04/2017    Lipid history: Has been on Lipitor, has triglycerides still over 200  His lipids are followed by PCP    Lab Results  Component Value Date   CHOL 129 12/24/2016   HDL 23.70 (L) 12/24/2016   LDLCALC 97 02/02/2016   LDLDIRECT 75.0 12/24/2016   TRIG 210.0 (H) 12/24/2016   CHOLHDL 5 12/24/2016           Hypertension:Long-standing, treated with Hyzaar  Recently his blood pressure had been higher and his PCP started him on 2.5 mg amlodipine However blood pressure has been still higher at home around 161-096/04 diastolic and today he was told to start taking 5 mg amlodipine   BP Readings from Last 3 Encounters:  03/06/17 112/78  02/07/17 (!) 150/84  12/27/16 122/78     Most recent eye exam was in 11/16  Most recent foot exam: 3/17  Review of Systems    Physical Examination:  BP 112/78   Pulse 79   Ht 5\' 9"  (1.753 m)   Wt 187 lb (84.8 kg)   BMI 27.62 kg/m      ASSESSMENT:  Diabetes type 2, BMI 26 See history of present illness for  discussion of current diabetes management, blood sugar patterns and problems identified  He has just started using the V-go pump However does not seem to be understanding the need for changing it  every morning or every 24 hours He does appear to have good fasting readings with the 40 unit basal even though he was taking 50 units of Lantus previously  Not clear how much he needs for boluses as he has not checked his readings after meals  Hypertension: Appears well controlled now  PLAN:     He was given a flowsheet for checking the sugar, writing down his boluses and instructions on how many clicks to use for his pump as well as reminder to change the pump every 24 hours which he will do in the evening when he can do it more consistently  Discussed application sides of the pump, he can do it more medially on his abdomen so that he does not feel it is much when he is lying down  He will write down his blood sugars in the logbook and we will discuss the readings on Monday  He can try 2 clicks at breakfast and lunch and 3 at dinnertime which is his main meal  Continue exercise  Discussed that if he has no readings he can reduce the boluses are if he has readings at least over 180 can increase the dose by 1 click  Patient Instructions  Clicks 5-4-0 before meals  Change EVERY 24 HRS  Counseling time on subjects discussed above is over 50% of today's 25 minute visit    Aide Wojnar  03/06/2017, 3:30 PM   Note: This office note was prepared with Dragon voice recognition system technology. Any transcriptional errors that result from this process are unintentional.

## 2017-03-08 ENCOUNTER — Encounter: Payer: Self-pay | Admitting: Endocrinology

## 2017-03-12 ENCOUNTER — Telehealth: Payer: Self-pay | Admitting: Nutrition

## 2017-03-12 NOTE — Telephone Encounter (Signed)
Please schedule patient for follow-up in about a month with labs

## 2017-03-12 NOTE — Telephone Encounter (Signed)
Message left on recorder to call me with blood sugar readings if to see how he is doing on the V-Go

## 2017-03-16 ENCOUNTER — Encounter: Payer: Self-pay | Admitting: Endocrinology

## 2017-03-17 ENCOUNTER — Other Ambulatory Visit: Payer: Self-pay

## 2017-03-17 DIAGNOSIS — H903 Sensorineural hearing loss, bilateral: Secondary | ICD-10-CM | POA: Diagnosis not present

## 2017-03-17 MED ORDER — METFORMIN HCL 1000 MG PO TABS: ORAL_TABLET | ORAL | 1 refills | Status: DC

## 2017-03-28 ENCOUNTER — Ambulatory Visit: Payer: Medicare HMO | Admitting: Endocrinology

## 2017-04-15 DIAGNOSIS — Z7984 Long term (current) use of oral hypoglycemic drugs: Secondary | ICD-10-CM | POA: Diagnosis not present

## 2017-04-15 DIAGNOSIS — Z794 Long term (current) use of insulin: Secondary | ICD-10-CM | POA: Diagnosis not present

## 2017-04-15 DIAGNOSIS — E1122 Type 2 diabetes mellitus with diabetic chronic kidney disease: Secondary | ICD-10-CM | POA: Diagnosis not present

## 2017-04-15 DIAGNOSIS — K219 Gastro-esophageal reflux disease without esophagitis: Secondary | ICD-10-CM | POA: Diagnosis not present

## 2017-04-15 DIAGNOSIS — E782 Mixed hyperlipidemia: Secondary | ICD-10-CM | POA: Diagnosis not present

## 2017-04-15 DIAGNOSIS — F339 Major depressive disorder, recurrent, unspecified: Secondary | ICD-10-CM | POA: Diagnosis not present

## 2017-04-15 DIAGNOSIS — N183 Chronic kidney disease, stage 3 (moderate): Secondary | ICD-10-CM | POA: Diagnosis not present

## 2017-04-15 DIAGNOSIS — I10 Essential (primary) hypertension: Secondary | ICD-10-CM | POA: Diagnosis not present

## 2017-04-24 ENCOUNTER — Encounter: Payer: Self-pay | Admitting: Endocrinology

## 2017-04-24 ENCOUNTER — Ambulatory Visit (INDEPENDENT_AMBULATORY_CARE_PROVIDER_SITE_OTHER): Payer: Medicare HMO | Admitting: Endocrinology

## 2017-04-24 VITALS — BP 138/84 | HR 63 | Ht 69.0 in | Wt 182.8 lb

## 2017-04-24 DIAGNOSIS — E1165 Type 2 diabetes mellitus with hyperglycemia: Secondary | ICD-10-CM

## 2017-04-24 DIAGNOSIS — I10 Essential (primary) hypertension: Secondary | ICD-10-CM

## 2017-04-24 DIAGNOSIS — Z794 Long term (current) use of insulin: Secondary | ICD-10-CM

## 2017-04-24 NOTE — Progress Notes (Signed)
Patient ID: Joel Webb, male   DOB: December 23, 1945, 71 y.o.   MRN: [id number]           Reason for Appointment: Follow-up for Type 2 Diabetes  Referring physician: Moreen Fowler  History of Present Illness:          Date of diagnosis of type 2 diabetes mellitus: 2007       Background history:   He is unclear when he was diagnosed to have diabetes but was not very symptomatic He had been on metformin alone for several years with reportedly good control Also had previously been very consistent with exercise and diet His blood sugars probably started going up 3-4 years ago and he was given Lantus insulin in addition He was seen in consultation in 4/17 with an A1c of 8.4 and he was started Trulicity in addition to his Lantus and metformin This was stopped in 7/17 because of high out-of-pocket expense  Recent history:   INSULIN regimen is: Lantus  50 units in p.m., Novolin R 8 units at supper  Previous regimen: V-go pump, 40 units basal, boluses 1-2 clicks at meals  Non-insulin hypoglycemic drugs the patient is taking are: metformin 1 g twice a day  His A1c was 7.6 in 2/18 and is now 7.4  Current blood sugar patterns and problems identified:  He was on the V-go pump but also this was working well for him after a couple of weeks he decided not to use it because it was not sticking well and it would be uncomfortable  He says he is more compliant with taking his suppertime insulin doses because of his not working and also setting an alarm on his phone to remind him to do the injection  His blood sugars are overall much higher recently but he thinks this is mostly since he has gone off his diet with his home remodeling being done and not cooking properly  HIGHEST blood sugar was 317 yesterday late afternoon after eating hamburgers and french fries  He has not done as much formal exercise because of working at home  However his weight is down  Also FASTING blood sugars seem to be mostly  high  He has not adjusted his Lantus as directed to keep his fasting blood sugars under control  He has only a few readings sporadically in the evenings with one good reading before supper and the rest are high  Side effects from medications have been: None  Compliance with the medical regimen: fair   Hypoglycemia: none    Glucose monitoring:  done less than 1 times a day         Glucometer:  Accu-Chek Blood Glucose readings by:  Mean values apply above for all meters except median for One Touch  PRE-MEAL Fasting Lunch Dinner Bedtime Overall  Glucose range: 79-184   133, 317  218    Mean/median: 161     172      Self-care: The diet that the patient has been following is: tries to limit High-fat foods .                 Dietician visit, most recent:6/17               Exercise: doing some Walking  Weight history:  Wt Readings from Last 3 Encounters:  04/24/17 182 lb 12.8 oz (82.9 kg)  03/06/17 187 lb (84.8 kg)  02/07/17 189 lb (85.7 kg)    Glycemic control:   Lab Results  Component Value Date   HGBA1C 7.4 04/25/2017   HGBA1C 7.6 (H) 12/24/2016   HGBA1C 6.9 (H) 08/21/2016   Lab Results  Component Value Date   MICROALBUR <0.7 08/26/2016   LDLCALC 97 02/02/2016   CREATININE 1.36 02/04/2017   Lab Results  Component Value Date   MICRALBCREAT 1.2 08/26/2016    Lab Results  Component Value Date   FRUCTOSAMINE 233 02/04/2017   FRUCTOSAMINE 229 06/25/2016   FRUCTOSAMINE 234 05/24/2016     Other problems addressed today: See review of systems   Office Visit on 04/24/2017  Component Date Value Ref Range Status  . Hemoglobin A1C 04/25/2017 7.4   Final      Allergies as of 04/24/2017   No Known Allergies     Medication List       Accurate as of 04/24/17 11:59 PM. Always use your most recent med list.          amLODipine 5 MG tablet Commonly known as:  NORVASC   aspirin 81 MG chewable tablet Chew by mouth daily. Every other day   atorvastatin 80  MG tablet Commonly known as:  LIPITOR Take 80 mg by mouth daily.   B-D INS SYRINGE 0.5CC/30GX1/2" 30G X 1/2" 0.5 ML Misc Generic drug:  Insulin Syringe-Needle U-100   fenofibrate 160 MG tablet Take 160 mg by mouth daily.   FLUCELVAX QUADRIVALENT 0.5 ML Susy Generic drug:  Influenza Vac Subunit Quad   glucose blood test strip Commonly known as:  ACCU-CHEK SMARTVIEW Use to check blood sugar 1 time per day.   insulin glargine 100 UNIT/ML injection Commonly known as:  LANTUS Inject 0.5 mLs (50 Units total) into the skin at bedtime. 50 UNITS AT NIGHT   insulin regular 100 units/mL injection Commonly known as:  NOVOLIN R RELION Inject 8 units at supper   losartan-hydrochlorothiazide 100-12.5 MG tablet Commonly known as:  HYZAAR Take 1 tablet by mouth daily.   metFORMIN 1000 MG tablet Commonly known as:  GLUCOPHAGE TAKE ONE TABLET BY MOUTH TWICE A DAY WITH A MEAL   multivitamin with minerals Tabs tablet Take 1 tablet by mouth daily.   omeprazole 20 MG capsule Commonly known as:  PRILOSEC Take 20 mg by mouth every other day. PRN   Pen Needles 31G X 6 MM Misc Use to inject insulin daily.   tamsulosin 0.4 MG Caps capsule Commonly known as:  FLOMAX Take 0.4 mg by mouth. Reported on 05/29/2016       Allergies: No Known Allergies  Past Medical History:  Diagnosis Date  . Diabetes mellitus without complication ([place])   . ED (erectile dysfunction)   . Hyperlipidemia   . Hypertension   . Plantar fasciitis   . Prostatitis     Past Surgical History:  Procedure Laterality Date  . CIRCUMCISION    . COLONOSCOPY      Family History  Problem Relation Age of Onset  . Hepatitis C Mother   . Mesothelioma Father   . Diabetes Father   . Heart disease Father     Social History:  reports that he has quit smoking. He has never used smokeless tobacco. He reports that he drinks alcohol. He reports that he does not use drugs.    Review of Systems     RENAL dysfunction:  This has Stabilized  Lab Results  Component Value Date   CREATININE 1.36 02/04/2017   BUN 22 02/04/2017   NA 142 02/04/2017   K 4.7 02/04/2017   CL 108  02/04/2017   CO2 27 02/04/2017    Lipid history: Has been on Lipitor, has triglycerides still over 200  His lipids are followed by PCP, Has low HDL    Lab Results  Component Value Date   CHOL 129 12/24/2016   HDL 23.70 (L) 12/24/2016   LDLCALC 97 02/02/2016   LDLDIRECT 75.0 12/24/2016   TRIG 210.0 (H) 12/24/2016   CHOLHDL 5 12/24/2016           Hypertension:Long-standing, treated with Hyzaar And also amlodipine 5 mg daily   However blood pressure has been Better at home around 130 with amlodipine, it was increased on his last visit   BP Readings from Last 3 Encounters:  04/24/17 138/84  03/06/17 112/78  02/07/17 (!) 150/84     Most recent eye exam was in 11/16  Most recent foot exam: 3/17  Review of Systems    Physical Examination:  BP 138/84   Pulse 63   Ht 5\' 9"  (1.753 m)   Wt 182 lb 12.8 oz (82.9 kg)   SpO2 97%   BMI 26.99 kg/m      ASSESSMENT:  Diabetes type 2, BMI 26 See history of present illness for  discussion of current diabetes management, blood sugar patterns and problems identified  Although his A1c is 7.4 he is recently having higher blood sugars He thinks this is from poor diet and lack of consistent exercise He is not wanting to go back to the V-go pump as it was not staying on consistently and causing discomfort This is despite explaining to him that he can apply it at different sites such as his upper arm He thinks he can be better compliant with his insulin injections now with his not working and using his phone to remind him to do the regular insulin injections However discussed with him that most likely he will need to take insulin for his lunch meal unless he is eating a very low carbohydrate diet  Hypertension: Appears  controlled now  RENAL dysfunction: Will need follow-up  on the next visit  PLAN:     He needs to start watching his diet and cut back on high-fat high carbohydrate meals  Will increase his mealtime doses by 2-4 units based on his meal size and fat intake, also he will need to start taking lunchtime coverage as above with about the same doses  Increase Lantus to 54 for now  Continue metformin for now  More glucose monitoring especially after meals and follow-up in 6 weeks to reexamine his treatment plan  Consider GLP-1 drug if still having postprandial hyperglycemia  He will try to be consistent with exercise  Patient Instructions  Lantus 54 units  R insulin 10-12 units with lunch and supper  More exercise   Counseling time on subjects discussed above is over 50% of today's 25 minute visit   Payeton Germani 04/25/2017, 12:19 PM   Note: This office note was prepared with Estate agent. Any transcriptional errors that result from this process are unintentional.

## 2017-04-24 NOTE — Patient Instructions (Addendum)
Lantus 54 units  R insulin 10-12 units with lunch and supper  More exercise

## 2017-04-25 LAB — POCT GLYCOSYLATED HEMOGLOBIN (HGB A1C): Hemoglobin A1C: 7.4

## 2017-04-27 ENCOUNTER — Encounter: Payer: Self-pay | Admitting: Endocrinology

## 2017-04-28 ENCOUNTER — Other Ambulatory Visit: Payer: Self-pay

## 2017-04-28 MED ORDER — INSULIN GLARGINE 100 UNIT/ML ~~LOC~~ SOLN: 50.0000 [IU] | Freq: Every day | SUBCUTANEOUS | 2 refills | Status: DC

## 2017-05-09 ENCOUNTER — Encounter: Payer: Self-pay | Admitting: Endocrinology

## 2017-05-09 ENCOUNTER — Other Ambulatory Visit: Payer: Self-pay

## 2017-05-09 ENCOUNTER — Telehealth: Payer: Self-pay

## 2017-05-09 MED ORDER — INSULIN SYRINGES (DISPOSABLE) U-100 1 ML MISC: 2 refills | Status: DC

## 2017-05-09 NOTE — Telephone Encounter (Signed)
Called pharmacy to confirm that patient needed 100cc insulin syringes.

## 2017-05-09 NOTE — Telephone Encounter (Signed)
[facility] is calling for the size request of the syringes please, the pt does want 30 g 100 cc can we resend with the size request please to [facility]

## 2017-06-02 ENCOUNTER — Other Ambulatory Visit (INDEPENDENT_AMBULATORY_CARE_PROVIDER_SITE_OTHER): Payer: Medicare HMO

## 2017-06-02 DIAGNOSIS — Z794 Long term (current) use of insulin: Secondary | ICD-10-CM | POA: Diagnosis not present

## 2017-06-02 DIAGNOSIS — E1165 Type 2 diabetes mellitus with hyperglycemia: Secondary | ICD-10-CM

## 2017-06-02 LAB — BASIC METABOLIC PANEL WITH GFR
BUN: 26 mg/dL — ABNORMAL HIGH (ref 6–23)
CO2: 27 meq/L (ref 19–32)
Calcium: 9.8 mg/dL (ref 8.4–10.5)
Chloride: 106 meq/L (ref 96–112)
Creatinine, Ser: 1.38 mg/dL (ref 0.40–1.50)
GFR: 54.04 mL/min — ABNORMAL LOW
Glucose, Bld: 134 mg/dL — ABNORMAL HIGH (ref 70–99)
Potassium: 4.9 meq/L (ref 3.5–5.1)
Sodium: 141 meq/L (ref 135–145)

## 2017-06-02 LAB — COMPREHENSIVE METABOLIC PANEL
ALBUMIN: 4.2 g/dL (ref 3.5–5.2)
ALK PHOS: 42 U/L (ref 39–117)
ALT: 24 U/L (ref 0–53)
AST: 21 U/L (ref 0–37)
BUN: 26 mg/dL — AB (ref 6–23)
CALCIUM: 9.8 mg/dL (ref 8.4–10.5)
CO2: 27 mEq/L (ref 19–32)
Chloride: 106 mEq/L (ref 96–112)
Creatinine, Ser: 1.38 mg/dL (ref 0.40–1.50)
GFR: 54.04 mL/min — ABNORMAL LOW (ref >60.00)
Glucose, Bld: 134 mg/dL — ABNORMAL HIGH (ref 70–99)
POTASSIUM: 4.9 meq/L (ref 3.5–5.1)
SODIUM: 141 meq/L (ref 135–145)
TOTAL PROTEIN: 6.8 g/dL (ref 6.0–8.3)
Total Bilirubin: 0.5 mg/dL (ref 0.2–1.2)

## 2017-06-02 LAB — HEMOGLOBIN A1C: HEMOGLOBIN A1C: 7.8 % — AB (ref 4.6–6.5)

## 2017-06-03 LAB — FRUCTOSAMINE: Fructosamine: 266 umol/L (ref 0–285)

## 2017-06-05 ENCOUNTER — Ambulatory Visit (INDEPENDENT_AMBULATORY_CARE_PROVIDER_SITE_OTHER): Payer: Medicare HMO | Admitting: Endocrinology

## 2017-06-05 ENCOUNTER — Encounter: Payer: Self-pay | Admitting: Endocrinology

## 2017-06-05 VITALS — BP 138/84 | HR 66 | Ht 69.0 in | Wt 187.2 lb

## 2017-06-05 DIAGNOSIS — E1165 Type 2 diabetes mellitus with hyperglycemia: Secondary | ICD-10-CM

## 2017-06-05 DIAGNOSIS — I10 Essential (primary) hypertension: Secondary | ICD-10-CM | POA: Diagnosis not present

## 2017-06-05 DIAGNOSIS — Z794 Long term (current) use of insulin: Secondary | ICD-10-CM

## 2017-06-05 HISTORY — DX: Essential (primary) hypertension: I10

## 2017-06-05 MED ORDER — DULAGLUTIDE 0.75 MG/0.5ML ~~LOC~~ SOAJ: SUBCUTANEOUS | 0 refills | Status: DC

## 2017-06-05 NOTE — Progress Notes (Signed)
Patient ID: Joel Webb, male   DOB: 02-12-1946, 71 y.o.   MRN: [id number]           Reason for Appointment: Follow-up for Type 2 Diabetes  Referring physician: Moreen Fowler  History of Present Illness:          Date of diagnosis of type 2 diabetes mellitus: 2007       Background history:   He is unclear when he was diagnosed to have diabetes but was not very symptomatic He had been on metformin alone for several years with reportedly good control Also had previously been very consistent with exercise and diet His blood sugars probably started going up 3-4 years ago and he was given Lantus insulin in addition He was seen in consultation in 4/17 with an A1c of 8.4 and he was started Trulicity in addition to his Lantus and metformin This was stopped in 7/17 because of high out-of-pocket expense  The V-go pump was stopped because responded to uncomfortable and not sticking well  Recent history:   INSULIN regimen is: Lantus  54 units in p.m., Novolin R 12 units at supper   Non-insulin hypoglycemic drugs the patient is taking are: metformin 1 g twice a day  His A1c was 7.6 in 2/18 and is now 7.4  Current blood sugar patterns and problems identified:  He was advised to start checking his blood sugars more often especially after meals and bring his meter  However he did not bring his monitor  Reportedly his FASTING blood sugars are not is high with increasing Lantus to 54 units  However they are somewhat variable he thinks occasionally they are higher in the morning than the night before  He now says that he is sometimes forgetting to take his Novolin regular insulin at lunchtime if he is in a hurry, he is otherwise trying to take 10-12 units anyway  He is now getting at least one carbohydrate at breakfast with toast or grits but not checking blood sugars after eating and also not taking any mealtime coverage for this  Has gained back the weight that he had lost  Postprandial  readings after supper: He is not remembering these well but does not think they are generally high  Side effects from medications have been: None  Compliance with the medical regimen: fair   Hypoglycemia: none    Glucose monitoring:  done less than 1 times a day         Glucometer:  Accu-Chek Blood Glucose readings by recall:  Mean values apply above for all meters except median for One Touch  PRE-MEAL Fasting Lunch Dinner Bedtime Overall  Glucose range: 98-137   160 117-160   Mean/median:         Self-care: The diet that the patient has been following is: tries to limit High-fat foods                  Dietician visit, most recent:6/17               Exercise: doing some Walking  Weight history:  Wt Readings from Last 3 Encounters:  06/05/17 187 lb 3.2 oz (84.9 kg)  04/24/17 182 lb 12.8 oz (82.9 kg)  03/06/17 187 lb (84.8 kg)    Glycemic control:   Lab Results  Component Value Date   HGBA1C 7.8 (H) 06/02/2017   HGBA1C 7.4 04/25/2017   HGBA1C 7.6 (H) 12/24/2016   Lab Results  Component Value Date   MICROALBUR <0.7  08/26/2016   LDLCALC 97 02/02/2016   CREATININE 1.38 06/02/2017   CREATININE 1.38 06/02/2017   Lab Results  Component Value Date   MICRALBCREAT 1.2 08/26/2016    Lab Results  Component Value Date   FRUCTOSAMINE 266 06/02/2017   FRUCTOSAMINE 233 02/04/2017   FRUCTOSAMINE 229 06/25/2016     Other problems addressed today: See review of systems   Appointment on 06/02/2017  Component Date Value Ref Range Status  . Hgb A1c MFr Bld 06/02/2017 7.8* 4.6 - 6.5 % Final   Glycemic Control Guidelines for People with Diabetes:Non Diabetic:  <6%Goal of Therapy: <7%Additional Action Suggested:  >8%   . Sodium 06/02/2017 141  135 - 145 mEq/L Final  . Potassium 06/02/2017 4.9  3.5 - 5.1 mEq/L Final  . Chloride 06/02/2017 106  96 - 112 mEq/L Final  . CO2 06/02/2017 27  19 - 32 mEq/L Final  . Glucose, Bld 06/02/2017 134* 70 - 99 mg/dL Final  . BUN  06/02/2017 26* 6 - 23 mg/dL Final  . Creatinine, Ser 06/02/2017 1.38  0.40 - 1.50 mg/dL Final  . Total Bilirubin 06/02/2017 0.5  0.2 - 1.2 mg/dL Final  . Alkaline Phosphatase 06/02/2017 42  39 - 117 U/L Final  . AST 06/02/2017 21  0 - 37 U/L Final  . ALT 06/02/2017 24  0 - 53 U/L Final  . Total Protein 06/02/2017 6.8  6.0 - 8.3 g/dL Final  . Albumin 06/02/2017 4.2  3.5 - 5.2 g/dL Final  . Calcium 06/02/2017 9.8  8.4 - 10.5 mg/dL Final  . GFR 06/02/2017 54.04* >60.00 mL/min Final  . Sodium 06/02/2017 141  135 - 145 mEq/L Final  . Potassium 06/02/2017 4.9  3.5 - 5.1 mEq/L Final  . Chloride 06/02/2017 106  96 - 112 mEq/L Final  . CO2 06/02/2017 27  19 - 32 mEq/L Final  . Glucose, Bld 06/02/2017 134* 70 - 99 mg/dL Final  . BUN 06/02/2017 26* 6 - 23 mg/dL Final  . Creatinine, Ser 06/02/2017 1.38  0.40 - 1.50 mg/dL Final  . Calcium 06/02/2017 9.8  8.4 - 10.5 mg/dL Final  . GFR 06/02/2017 54.04* >60.00 mL/min Final  . Fructosamine 06/02/2017 266  0 - 285 umol/L Final   Comment: Published reference interval for apparently healthy subjects between age 52 and 25 is 50 - 285 umol/L and in a poorly controlled diabetic population is 228 - 563 umol/L with a mean of 396 umol/L.       Allergies as of 06/05/2017   No Known Allergies     Medication List       Accurate as of 06/05/17  3:28 PM. Always use your most recent med list.          amLODipine 5 MG tablet Commonly known as:  NORVASC   aspirin 81 MG chewable tablet Chew by mouth daily. Every other day   atorvastatin 80 MG tablet Commonly known as:  LIPITOR Take 80 mg by mouth daily.   B-D INS SYRINGE 0.5CC/30GX1/2" 30G X 1/2" 0.5 ML Misc Generic drug:  Insulin Syringe-Needle U-100   Dulaglutide 0.75 MG/0.5ML Sopn Commonly known as:  TRULICITY Inject in the abdominal skin as directed once a week   fenofibrate 160 MG tablet Take 160 mg by mouth daily.   FLUCELVAX QUADRIVALENT 0.5 ML Susy Generic drug:  Influenza Vac  Subunit Quad   glucose blood test strip Commonly known as:  ACCU-CHEK SMARTVIEW Use to check blood sugar 1 time per day.  insulin glargine 100 UNIT/ML injection Commonly known as:  LANTUS Inject 0.5 mLs (50 Units total) into the skin at bedtime. 50 UNITS AT NIGHT   insulin regular 100 units/mL injection Commonly known as:  NOVOLIN R RELION Inject 8 units at supper   Insulin Syringes (Disposable) U-100 1 ML Misc Use to give 54 units of Lantus insulin daily   losartan-hydrochlorothiazide 100-12.5 MG tablet Commonly known as:  HYZAAR Take 1 tablet by mouth daily.   metFORMIN 1000 MG tablet Commonly known as:  GLUCOPHAGE TAKE ONE TABLET BY MOUTH TWICE A DAY WITH A MEAL   multivitamin with minerals Tabs tablet Take 1 tablet by mouth daily.   omeprazole 20 MG capsule Commonly known as:  PRILOSEC Take 20 mg by mouth every other day. PRN   Pen Needles 31G X 6 MM Misc Use to inject insulin daily.   tamsulosin 0.4 MG Caps capsule Commonly known as:  FLOMAX Take 0.4 mg by mouth. Reported on 05/29/2016       Allergies: No Known Allergies  Past Medical History:  Diagnosis Date  . Diabetes mellitus without complication ([place])   . ED (erectile dysfunction)   . Hyperlipidemia   . Hypertension   . Plantar fasciitis   . Prostatitis     Past Surgical History:  Procedure Laterality Date  . CIRCUMCISION    . COLONOSCOPY      Family History  Problem Relation Age of Onset  . Hepatitis C Mother   . Mesothelioma Father   . Diabetes Father   . Heart disease Father     Social History:  reports that he has quit smoking. He has never used smokeless tobacco. He reports that he drinks alcohol. He reports that he does not use drugs.    Review of Systems     RENAL dysfunction: Creatinine is now upper normal   Lab Results  Component Value Date   CREATININE 1.38 06/02/2017   CREATININE 1.38 06/02/2017   BUN 26 (H) 06/02/2017   BUN 26 (H) 06/02/2017   NA 141 06/02/2017     NA 141 06/02/2017   K 4.9 06/02/2017   K 4.9 06/02/2017   CL 106 06/02/2017   CL 106 06/02/2017   CO2 27 06/02/2017   CO2 27 06/02/2017    Lipid history: Has been on Lipitor, has triglyceride level of 141 with PCP in 5/18  His lipids are followed by PCP, Has low HDL, last 29    Lab Results  Component Value Date   CHOL 129 12/24/2016   HDL 23.70 (L) 12/24/2016   LDLCALC 97 02/02/2016   LDLDIRECT 75.0 12/24/2016   TRIG 210.0 (H) 12/24/2016   CHOLHDL 5 12/24/2016           Hypertension:Long-standing, treated with Hyzaar And also amlodipine 5 mg daily   However blood pressure has been Better at home around 130 with amlodipine, it was increased on his last visit   BP Readings from Last 3 Encounters:  06/05/17 138/84  04/24/17 138/84  03/06/17 112/78     Most recent eye exam was in 02/2017  Most recent foot exam: 05/2017  Review of Systems    Physical Examination:  BP 138/84   Pulse 66   Ht 5\' 9"  (1.753 m)   Wt 187 lb 3.2 oz (84.9 kg)   SpO2 96%   BMI 27.64 kg/m      Diabetic Foot Exam - Simple   Simple Foot Form Diabetic Foot exam was performed with the following  findings:  Yes 06/05/2017  3:08 PM  Visual Inspection No deformities, no ulcerations, no other skin breakdown bilaterally:  Yes Sensation Testing Intact to touch and monofilament testing bilaterally:  Yes Pulse Check Posterior Tibialis and Dorsalis pulse intact bilaterally:  Yes Comments    No pedal edema Minimal onychomycosis present  ASSESSMENT:  Diabetes type 2, BMI 26 See history of present illness for  discussion of current diabetes management, blood sugar patterns and problems identified  His A1c is still not improved He is probably needing more postprandial blood sugar control since with increasing her Lantus is fasting readings are relatively better reportedly He is not always compliant with mealtime insulin at lunchtime Also currently not taking any coverage for breakfast He  is still not wanting to consider V-go pump which he did not tolerate  He is however open to the idea of trying Trulicity again, discussed how this works  Hypertension:  controlled now  RENAL dysfunction: Stable  LIPIDS: Better controlled as of 5/18 from PCP  PLAN:     He needs to start Checking blood sugar most consistently after meals  He will try Trulicity again, he thinks he can check to see the price and the local drugstore  This may help him with his overall control, reducing insulin doses and reducing portions along with some weight loss which he is not able to achieve  Start taking at least 5-6 units at breakfast  Discussed reducing or doses of insulin by 2-4 units when starting Trulicity and may need to reduce them further  Needs to bring his monitor for download on each visit  He will try to be consistent with exercise  Patient Instructions  Take 5-6 R insulin before Bfst, with Trulicity reduce doses by 2 units at least  Check blood sugars on waking up  3/7  Also check blood sugars about 2 hours after a meal and do this after different meals by rotation  Recommended blood sugar levels on waking up is 90-130 and about 2 hours after meal is 130-160  Please bring your blood sugar monitor to each visit, thank you    Counseling time on subjects discussed in assessment and plan sections is over 50% of today's 25 minute visit   Asees Manfredi 06/05/2017, 3:28 PM   Note: This office note was prepared with Dragon voice recognition system technology. Any transcriptional errors that result from this process are unintentional.

## 2017-06-05 NOTE — Patient Instructions (Signed)
Take 5-6 R insulin before Bfst, with Trulicity reduce doses by 2 units at least  Check blood sugars on waking up  3/7  Also check blood sugars about 2 hours after a meal and do this after different meals by rotation  Recommended blood sugar levels on waking up is 90-130 and about 2 hours after meal is 130-160  Please bring your blood sugar monitor to each visit, thank you

## 2017-06-21 ENCOUNTER — Other Ambulatory Visit: Payer: Self-pay | Admitting: Endocrinology

## 2017-06-23 ENCOUNTER — Other Ambulatory Visit: Payer: Self-pay

## 2017-06-23 MED ORDER — DULAGLUTIDE 0.75 MG/0.5ML ~~LOC~~ SOAJ: SUBCUTANEOUS | 0 refills | Status: DC

## 2017-06-27 ENCOUNTER — Encounter: Payer: Self-pay | Admitting: Endocrinology

## 2017-07-09 ENCOUNTER — Telehealth: Payer: Self-pay

## 2017-07-09 MED ORDER — INSULIN SYRINGES (DISPOSABLE) U-100 1 ML MISC: 2 refills | Status: DC

## 2017-07-09 NOTE — Telephone Encounter (Signed)
[place], [place] [phone] (Phone) [phone] (Fax)   Needs clarification on the B-D INS SYRINGE 0.5CC/30GX1/2" 30G X 1/2" 0.5 ML MISC. Call to advise.

## 2017-07-10 NOTE — Telephone Encounter (Signed)
Routing to you °

## 2017-07-11 ENCOUNTER — Other Ambulatory Visit: Payer: Self-pay

## 2017-07-11 ENCOUNTER — Encounter: Payer: Self-pay | Admitting: Endocrinology

## 2017-07-11 MED ORDER — INSULIN SYRINGES (DISPOSABLE) U-100 1 ML MISC: 2 refills | Status: DC

## 2017-07-14 ENCOUNTER — Other Ambulatory Visit: Payer: Self-pay

## 2017-07-14 MED ORDER — "BD INSULIN SYRINGE 30G X 1/2"" 0.5 ML MISC": 3 refills | Status: DC

## 2017-07-14 NOTE — Telephone Encounter (Signed)
[place] needs a call to discuss the type of syringes that the patient takes. Call pharmacy to advise [phone].

## 2017-07-15 NOTE — Telephone Encounter (Signed)
This has been ordered 

## 2017-07-25 ENCOUNTER — Encounter: Payer: Self-pay | Admitting: Endocrinology

## 2017-07-25 ENCOUNTER — Other Ambulatory Visit: Payer: Self-pay

## 2017-07-25 MED ORDER — DULAGLUTIDE 0.75 MG/0.5ML ~~LOC~~ SOAJ: SUBCUTANEOUS | 0 refills | Status: DC

## 2017-08-06 ENCOUNTER — Ambulatory Visit (INDEPENDENT_AMBULATORY_CARE_PROVIDER_SITE_OTHER): Payer: Medicare HMO | Admitting: Endocrinology

## 2017-08-06 ENCOUNTER — Encounter: Payer: Self-pay | Admitting: Endocrinology

## 2017-08-06 VITALS — BP 120/76 | HR 60 | Ht 69.0 in | Wt 180.0 lb

## 2017-08-06 DIAGNOSIS — E1165 Type 2 diabetes mellitus with hyperglycemia: Secondary | ICD-10-CM | POA: Diagnosis not present

## 2017-08-06 DIAGNOSIS — Z794 Long term (current) use of insulin: Secondary | ICD-10-CM

## 2017-08-06 NOTE — Patient Instructions (Addendum)
Check blood sugars on waking up  3/7  Also check blood sugars about 2 hours after a meal and do this after different meals by rotation  Recommended blood sugar levels on waking up is 90-130 and about 2 hours after meal is 130-160  Please bring your blood sugar monitor to each visit, thank you  Take sugar when taking lantus

## 2017-08-06 NOTE — Progress Notes (Signed)
Patient ID: Joel Webb, male   DOB: May 25, 1946, 71 y.o.   MRN: [id number]           Reason for Appointment: Follow-up for Type 2 Diabetes  Referring physician: Moreen Fowler  History of Present Illness:          Date of diagnosis of type 2 diabetes mellitus: 2007       Background history:   He is unclear when he was diagnosed to have diabetes but was not very symptomatic He had been on metformin alone for several years with reportedly good control Also had previously been very consistent with exercise and diet His blood sugars probably started going up 3-4 years ago and he was given Lantus insulin in addition He was seen in consultation in 4/17 with an A1c of 8.4 and he was started Trulicity in addition to his Lantus and metformin This was stopped in 7/17 because of high out-of-pocket expense  The V-go pump was stopped because responded to uncomfortable and not sticking well  Recent history:   INSULIN regimen is: Lantus  54 units in p.m., Novolin R 12 units at supper   Non-insulin hypoglycemic drugs the patient is taking are: metformin 1 g twice a day, Trulicity 7.00 mg weekly  His A1c was 7.8 on his last visit  Current blood sugar patterns and problems identified:  He was advised to start checking his blood sugars after meals but he forgets to do so  He has only one reading after lunch which was fairly good  However he has started TRULICITY since his last visit  He does think that at times he does not get as hungry with this  He has lost 7 pounds  Overall trying to eat healthy and more grilled foods but occasionally will have a high-fat meal, test blood sugar was after a high-fat meal the night before 9/17 when his blood sugar was 174 fasting  No hypoglycemia with taking his suppertime coverage of 12 units  Although he was told to start taking some insulin for covering breakfast he forgets to do this  Postprandial readings after supper: He is not remembering to check  this  Side effects from medications have been: None  Compliance with the medical regimen: fair   Hypoglycemia: none    Glucose monitoring:  done less than 1 times a day         Glucometer:  Accu-Chek Blood Glucose readings by recall:  Mean values apply above for all meters except median for One Touch  PRE-MEAL Fasting Lunch Dinner Bedtime Overall  Glucose range: 97-1 88       Mean/median:     135   POST-MEAL PC Breakfast PC Lunch PC Dinner  Glucose range:  147    Mean/median:       Self-care: The diet that the patient has been following [id number] tries to limit High-fat foods                  Dietician visit, most recent:6/17               Exercise: some Walking, playing golf, sometimes tenderness  Weight history:  Wt Readings from Last 3 Encounters:  08/06/17 180 lb (81.6 kg)  06/05/17 187 lb 3.2 oz (84.9 kg)  04/24/17 182 lb 12.8 oz (82.9 kg)    Glycemic control:   Lab Results  Component Value Date   HGBA1C 7.8 (H) 06/02/2017   HGBA1C 7.4 04/25/2017   HGBA1C 7.6 (H) 12/24/2016  Lab Results  Component Value Date   MICROALBUR <0.7 08/26/2016   LDLCALC 97 02/02/2016   CREATININE 1.38 06/02/2017   CREATININE 1.38 06/02/2017   Lab Results  Component Value Date   MICRALBCREAT 1.2 08/26/2016    Lab Results  Component Value Date   FRUCTOSAMINE 266 06/02/2017   FRUCTOSAMINE 233 02/04/2017   FRUCTOSAMINE 229 06/25/2016     Other problems addressed today: See review of systems   No visits with results within 1 Week(s) from this visit.  Latest known visit with results is:  Appointment on 06/02/2017  Component Date Value Ref Range Status  . Hgb A1c MFr Bld 06/02/2017 7.8* 4.6 - 6.5 % Final   Glycemic Control Guidelines for People with Diabetes:Non Diabetic:  <6%Goal of Therapy: <7%Additional Action Suggested:  >8%   . Sodium 06/02/2017 141  135 - 145 mEq/L Final  . Potassium 06/02/2017 4.9  3.5 - 5.1 mEq/L Final  . Chloride 06/02/2017 106  96 - 112  mEq/L Final  . CO2 06/02/2017 27  19 - 32 mEq/L Final  . Glucose, Bld 06/02/2017 134* 70 - 99 mg/dL Final  . BUN 06/02/2017 26* 6 - 23 mg/dL Final  . Creatinine, Ser 06/02/2017 1.38  0.40 - 1.50 mg/dL Final  . Total Bilirubin 06/02/2017 0.5  0.2 - 1.2 mg/dL Final  . Alkaline Phosphatase 06/02/2017 42  39 - 117 U/L Final  . AST 06/02/2017 21  0 - 37 U/L Final  . ALT 06/02/2017 24  0 - 53 U/L Final  . Total Protein 06/02/2017 6.8  6.0 - 8.3 g/dL Final  . Albumin 06/02/2017 4.2  3.5 - 5.2 g/dL Final  . Calcium 06/02/2017 9.8  8.4 - 10.5 mg/dL Final  . GFR 06/02/2017 54.04* >60.00 mL/min Final  . Sodium 06/02/2017 141  135 - 145 mEq/L Final  . Potassium 06/02/2017 4.9  3.5 - 5.1 mEq/L Final  . Chloride 06/02/2017 106  96 - 112 mEq/L Final  . CO2 06/02/2017 27  19 - 32 mEq/L Final  . Glucose, Bld 06/02/2017 134* 70 - 99 mg/dL Final  . BUN 06/02/2017 26* 6 - 23 mg/dL Final  . Creatinine, Ser 06/02/2017 1.38  0.40 - 1.50 mg/dL Final  . Calcium 06/02/2017 9.8  8.4 - 10.5 mg/dL Final  . GFR 06/02/2017 54.04* >60.00 mL/min Final  . Fructosamine 06/02/2017 266  0 - 285 umol/L Final   Comment: Published reference interval for apparently healthy subjects between age 57 and 6 is 4 - 285 umol/L and in a poorly controlled diabetic population is 228 - 563 umol/L with a mean of 396 umol/L.       Allergies as of 08/06/2017   No Known Allergies     Medication List       Accurate as of 08/06/17  9:02 AM. Always use your most recent med list.          amLODipine 5 MG tablet Commonly known as:  NORVASC   aspirin 81 MG chewable tablet Chew by mouth daily. Every other day   atorvastatin 80 MG tablet Commonly known as:  LIPITOR Take 80 mg by mouth daily.   B-D INS SYRINGE 0.5CC/30GX1/2" 30G X 1/2" 0.5 ML Misc Generic drug:  Insulin Syringe-Needle U-100 Use to inject insulin twice daily   Dulaglutide 0.75 MG/0.5ML Sopn Commonly known as:  TRULICITY Inject in the abdominal skin as  directed once a week   fenofibrate 160 MG tablet Take 160 mg by mouth daily.   FLUCELVAX  QUADRIVALENT 0.5 ML Susy Generic drug:  Influenza Vac Subunit Quad   glucose blood test strip Commonly known as:  ACCU-CHEK SMARTVIEW Use to check blood sugar 1 time per day.   insulin glargine 100 UNIT/ML injection Commonly known as:  LANTUS Inject 0.5 mLs (50 Units total) into the skin at bedtime. 50 UNITS AT NIGHT   insulin regular 100 units/mL injection Commonly known as:  NOVOLIN R RELION Inject 8 units at supper   Insulin Syringes (Disposable) U-100 1 ML Misc Use to give 2 insulin injections daily   losartan-hydrochlorothiazide 100-12.5 MG tablet Commonly known as:  HYZAAR Take 1 tablet by mouth daily.   metFORMIN 1000 MG tablet Commonly known as:  GLUCOPHAGE TAKE ONE TABLET BY MOUTH TWICE A DAY WITH A MEAL   multivitamin with minerals Tabs tablet Take 1 tablet by mouth daily.   omeprazole 20 MG capsule Commonly known as:  PRILOSEC Take 20 mg by mouth every other day. PRN   Pen Needles 31G X 6 MM Misc Use to inject insulin daily.   tamsulosin 0.4 MG Caps capsule Commonly known as:  FLOMAX Take 0.4 mg by mouth. Reported on 05/29/2016       Allergies: No Known Allergies  Past Medical History:  Diagnosis Date  . Diabetes mellitus without complication ([place])   . ED (erectile dysfunction)   . Hyperlipidemia   . Hypertension   . Plantar fasciitis   . Prostatitis     Past Surgical History:  Procedure Laterality Date  . CIRCUMCISION    . COLONOSCOPY      Family History  Problem Relation Age of Onset  . Hepatitis C Mother   . Mesothelioma Father   . Diabetes Father   . Heart disease Father     Social History:  reports that he has quit smoking. He has never used smokeless tobacco. He reports that he drinks alcohol. He reports that he does not use drugs.    Review of Systems     RENAL dysfunction: Creatinine is More recently upper normal   Lab Results    Component Value Date   CREATININE 1.38 06/02/2017   CREATININE 1.38 06/02/2017   BUN 26 (H) 06/02/2017   BUN 26 (H) 06/02/2017   NA 141 06/02/2017   NA 141 06/02/2017   K 4.9 06/02/2017   K 4.9 06/02/2017   CL 106 06/02/2017   CL 106 06/02/2017   CO2 27 06/02/2017   CO2 27 06/02/2017    Lipid history: Has been on Lipitor, has triglyceride level of 141 with PCP in 5/18  His lipids are followed by PCP, Has low HDL, last 29    Lab Results  Component Value Date   CHOL 129 12/24/2016   HDL 23.70 (L) 12/24/2016   LDLCALC 97 02/02/2016   LDLDIRECT 75.0 12/24/2016   TRIG 210.0 (H) 12/24/2016   CHOLHDL 5 12/24/2016           Hypertension:Long-standing, treated with Hyzaar And also amlodipine 5 mg daily   However blood pressure has been Controlled at home around 130 or less   BP Readings from Last 3 Encounters:  08/06/17 120/76  06/05/17 138/84  04/24/17 138/84     Most recent eye exam was in 02/2017  Most recent foot exam: 05/2017  Review of Systems    Physical Examination:  BP 120/76   Pulse 60   Ht 5\' 9"  (1.753 m)   Wt 180 lb (81.6 kg)   SpO2 98%   BMI 26.58  kg/m      ASSESSMENT:  Diabetes type 2, BMI 26 See history of present illness for  discussion of current diabetes management, blood sugar patterns and problems identified  His Weight has come down and fasting readings are generally fairly close to target No recent labs available and not clear if his overall control is better with adding Trulicity, hopefully postprandial readings are better also He has not taken any insulin to cover his meals except at suppertime and not clear if he needs additional coverage for breakfast also  PLAN:     No change in medication regimen as yet  He can try to check his blood sugar at bedtime when he takes his Lantus  His A1c is still high on the next visit may consider adding insulin for his breakfast meal also  Discussed high A1c is averaging his blood sugars  both before and after meals and higher than the average of his fasting readings  There are no Patient Instructions on file for this visit.    Kadedra Vanaken 08/06/2017, 9:02 AM   Note: This office note was prepared with Estate agent. Any transcriptional errors that result from this process are unintentional.

## 2017-08-07 ENCOUNTER — Other Ambulatory Visit: Payer: Self-pay | Admitting: Endocrinology

## 2017-08-22 ENCOUNTER — Other Ambulatory Visit: Payer: Self-pay | Admitting: Endocrinology

## 2017-08-29 ENCOUNTER — Other Ambulatory Visit: Payer: Self-pay

## 2017-08-29 MED ORDER — DULAGLUTIDE 0.75 MG/0.5ML ~~LOC~~ SOAJ: SUBCUTANEOUS | 0 refills | Status: DC

## 2017-08-29 NOTE — Telephone Encounter (Signed)
Pt needs trulicity called into [facility] at [facility] please he is completely out

## 2017-09-20 ENCOUNTER — Other Ambulatory Visit: Payer: Self-pay | Admitting: Endocrinology

## 2017-09-23 ENCOUNTER — Encounter: Payer: Self-pay | Admitting: Endocrinology

## 2017-09-24 ENCOUNTER — Other Ambulatory Visit: Payer: Self-pay

## 2017-09-24 ENCOUNTER — Encounter: Payer: Self-pay | Admitting: Endocrinology

## 2017-09-24 MED ORDER — DULAGLUTIDE 0.75 MG/0.5ML ~~LOC~~ SOAJ: SUBCUTANEOUS | 0 refills | Status: DC

## 2017-09-26 ENCOUNTER — Telehealth: Payer: Self-pay | Admitting: Endocrinology

## 2017-09-26 ENCOUNTER — Other Ambulatory Visit: Payer: Self-pay

## 2017-09-26 MED ORDER — DULAGLUTIDE 0.75 MG/0.5ML ~~LOC~~ SOAJ: SUBCUTANEOUS | 0 refills | Status: DC

## 2017-09-26 NOTE — Telephone Encounter (Signed)
Pharmacy is calling to clarify quantity number for Trulicity. Please call pharmacy to clarify ph# [phone]

## 2017-09-26 NOTE — Telephone Encounter (Signed)
Called and clarified

## 2017-10-02 ENCOUNTER — Other Ambulatory Visit (INDEPENDENT_AMBULATORY_CARE_PROVIDER_SITE_OTHER): Payer: Medicare HMO

## 2017-10-02 DIAGNOSIS — E1165 Type 2 diabetes mellitus with hyperglycemia: Secondary | ICD-10-CM | POA: Diagnosis not present

## 2017-10-02 DIAGNOSIS — Z794 Long term (current) use of insulin: Secondary | ICD-10-CM

## 2017-10-02 LAB — MICROALBUMIN / CREATININE URINE RATIO
CREATININE, U: 87.1 mg/dL
MICROALB UR: 0.9 mg/dL (ref 0.0–1.9)
MICROALB/CREAT RATIO: 1 mg/g (ref 0.0–30.0)

## 2017-10-02 LAB — COMPREHENSIVE METABOLIC PANEL
ALT: 19 U/L (ref 0–53)
AST: 17 U/L (ref 0–37)
Albumin: 4.1 g/dL (ref 3.5–5.2)
Alkaline Phosphatase: 42 U/L (ref 39–117)
BILIRUBIN TOTAL: 0.4 mg/dL (ref 0.2–1.2)
BUN: 25 mg/dL — ABNORMAL HIGH (ref 6–23)
CHLORIDE: 105 meq/L (ref 96–112)
CO2: 28 meq/L (ref 19–32)
Calcium: 9.2 mg/dL (ref 8.4–10.5)
Creatinine, Ser: 1.38 mg/dL (ref 0.40–1.50)
GFR: 53.99 mL/min — AB (ref >60.00)
GLUCOSE: 112 mg/dL — AB (ref 70–99)
Potassium: 4.3 mEq/L (ref 3.5–5.1)
Sodium: 140 mEq/L (ref 135–145)
Total Protein: 6.5 g/dL (ref 6.0–8.3)

## 2017-10-02 LAB — HEMOGLOBIN A1C: Hgb A1c MFr Bld: 6.7 % — ABNORMAL HIGH (ref 4.6–6.5)

## 2017-10-06 ENCOUNTER — Ambulatory Visit: Payer: Medicare HMO | Admitting: Endocrinology

## 2017-10-06 ENCOUNTER — Encounter: Payer: Self-pay | Admitting: Endocrinology

## 2017-10-06 VITALS — BP 132/74 | HR 70 | Ht 69.0 in | Wt 185.6 lb

## 2017-10-06 DIAGNOSIS — E1165 Type 2 diabetes mellitus with hyperglycemia: Secondary | ICD-10-CM | POA: Diagnosis not present

## 2017-10-06 DIAGNOSIS — Z794 Long term (current) use of insulin: Secondary | ICD-10-CM | POA: Diagnosis not present

## 2017-10-06 NOTE — Patient Instructions (Signed)
Check cost of [place] Max Solostar  Check blood sugars on waking up  3/7 days  Also check blood sugars about 2 hours after a meal and do this after different meals by rotation  Recommended blood sugar levels on waking up is 90-130 and about 2 hours after meal is 130-160  Please bring your blood sugar monitor to each visit, thank you

## 2017-10-06 NOTE — Progress Notes (Signed)
Patient ID: Joel Webb, male   DOB: [birth year], 71 y.o.   MRN: [id number]           Reason for Appointment: Follow-up for Type 2 Diabetes  Referring physician: Moreen Fowler  History of Present Illness:          Date of diagnosis of type 2 diabetes mellitus: 2007       Background history:   He is unclear when he was diagnosed to have diabetes but was not very symptomatic He had been on metformin alone for several years with reportedly good control Also had previously been very consistent with exercise and diet His blood sugars probably started going up 3-4 years ago and he was given Lantus insulin in addition He was seen in consultation in 4/17 with an A1c of 8.4 and he was started Trulicity in addition to his Lantus and metformin This was stopped in 7/17 because of high out-of-pocket expense  The V-go pump was stopped because responded to uncomfortable and not sticking well  Recent history:   INSULIN regimen is: Lantus  54 units in p.m., Novolin R 12 units at supper   Non-insulin hypoglycemic drugs the patient is taking are: metformin 1 g twice a day, Trulicity 5.28 mg weekly  His A1c was 7.8 on his last visit and now 6.7  Current blood sugar patterns and problems identified:  He was able to start back on Trulicity in July and with this his blood sugar control is better and generally has trouble affording this  He takes regular insulin only at suppertime and has had difficulties with compliance with taking multiple mealtime shots in the past  He is trying to take his suppertime dose more regularly although will probably not take it if he is eating out and take it when he comes back  He is only checking a few readings after supper but these are sporadic and not unusually high  Blood sugars maybe higher late morning after drinking coffee but before breakfast and early morning readings are near normal  No hypoglycemia reported at any time even with her blood sugar of 69 one  morning  His weight has gone up again but he thinks he is trying to exercise  Takes Lantus close to bedtime and taking about the same dose for sometime  Side effects from medications have been: None  Compliance with the medical regimen: fair   Hypoglycemia: none    Glucose monitoring:  done less than 1 times a day         Glucometer:  Accu-Chek Blood Glucose readings by download:  Mean values apply above for all meters except median for One Touch  PRE-MEAL Fasting Lunch Dinner Bedtime Overall  Glucose range: 69- 118 158   97-176    Mean/median:     129    POST-MEAL PC Breakfast PC Lunch PC Dinner  Glucose range: 163, 182     Mean/median:       Self-care: The diet that the patient has been following is: tries to limit High-fat foods and eating out less recently                  Dietician visit, most recent:6/17               Exercise: some Walking, playing golf, sometimes at Gym  Weight history:  Wt Readings from Last 3 Encounters:  10/06/17 185 lb 9.6 oz (84.2 kg)  08/06/17 180 lb (81.6 kg)  06/05/17 187 lb 3.2  oz (84.9 kg)    Glycemic control:   Lab Results  Component Value Date   HGBA1C 6.7 (H) 10/02/2017   HGBA1C 7.8 (H) 06/02/2017   HGBA1C 7.4 04/25/2017   Lab Results  Component Value Date   MICROALBUR 0.9 10/02/2017   LDLCALC 97 02/02/2016   CREATININE 1.38 10/02/2017   Lab Results  Component Value Date   MICRALBCREAT 1.0 10/02/2017    Lab Results  Component Value Date   FRUCTOSAMINE 266 06/02/2017   FRUCTOSAMINE 233 02/04/2017   FRUCTOSAMINE 229 06/25/2016     Other problems addressed today: See review of systems   Lab on 10/02/2017  Component Date Value Ref Range Status  . Microalb, Ur 10/02/2017 0.9  0.0 - 1.9 mg/dL Final  . Creatinine,U 10/02/2017 87.1  mg/dL Final  . Microalb Creat Ratio 10/02/2017 1.0  0.0 - 30.0 mg/g Final  . Sodium 10/02/2017 140  135 - 145 mEq/L Final  . Potassium 10/02/2017 4.3  3.5 - 5.1 mEq/L Final  .  Chloride 10/02/2017 105  96 - 112 mEq/L Final  . CO2 10/02/2017 28  19 - 32 mEq/L Final  . Glucose, Bld 10/02/2017 112* 70 - 99 mg/dL Final  . BUN 10/02/2017 25* 6 - 23 mg/dL Final  . Creatinine, Ser 10/02/2017 1.38  0.40 - 1.50 mg/dL Final  . Total Bilirubin 10/02/2017 0.4  0.2 - 1.2 mg/dL Final  . Alkaline Phosphatase 10/02/2017 42  39 - 117 U/L Final  . AST 10/02/2017 17  0 - 37 U/L Final  . ALT 10/02/2017 19  0 - 53 U/L Final  . Total Protein 10/02/2017 6.5  6.0 - 8.3 g/dL Final  . Albumin 10/02/2017 4.1  3.5 - 5.2 g/dL Final  . Calcium 10/02/2017 9.2  8.4 - 10.5 mg/dL Final  . GFR 10/02/2017 53.99* >60.00 mL/min Final  . Hgb A1c MFr Bld 10/02/2017 6.7* 4.6 - 6.5 % Final   Glycemic Control Guidelines for People with Diabetes:Non Diabetic:  <6%Goal of Therapy: <7%Additional Action Suggested:  >8%       Allergies as of 10/06/2017   No Known Allergies     Medication List        Accurate as of 10/06/17  8:22 AM. Always use your most recent med list.          amLODipine 5 MG tablet Commonly known as:  NORVASC   aspirin 81 MG chewable tablet Chew by mouth daily. Every other day   atorvastatin 80 MG tablet Commonly known as:  LIPITOR Take 80 mg by mouth daily.   B-D INS SYRINGE 0.5CC/30GX1/2" 30G X 1/2" 0.5 ML Misc Generic drug:  Insulin Syringe-Needle U-100 Use to inject insulin twice daily   Dulaglutide 0.75 MG/0.5ML Sopn Commonly known as:  TRULICITY Inject in the abdominal skin as directed once a week   fenofibrate 160 MG tablet Take 160 mg by mouth daily.   FLUCELVAX QUADRIVALENT 0.5 ML Susy Generic drug:  Influenza Vac Subunit Quad   glucose blood test strip Commonly known as:  ACCU-CHEK SMARTVIEW Use to check blood sugar 1 time per day.   insulin regular 100 units/mL injection Commonly known as:  NOVOLIN R RELION Inject 8 units at supper   Insulin Syringes (Disposable) U-100 1 ML Misc Use to give 2 insulin injections daily   LANTUS 100 UNIT/ML  injection Generic drug:  insulin glargine INJECT 0.5 MLS UNDER THE SKIN AT BEDTIME   losartan-hydrochlorothiazide 100-12.5 MG tablet Commonly known as:  HYZAAR Take 1 tablet  by mouth daily.   metFORMIN 1000 MG tablet Commonly known as:  GLUCOPHAGE TAKE ONE TABLET BY MOUTH TWICE A DAY WITH A MEAL   multivitamin with minerals Tabs tablet Take 1 tablet by mouth daily.   omeprazole 20 MG capsule Commonly known as:  PRILOSEC Take 20 mg by mouth every other day. PRN   Pen Needles 31G X 6 MM Misc Use to inject insulin daily.   tamsulosin 0.4 MG Caps capsule Commonly known as:  FLOMAX Take 0.4 mg by mouth. Reported on 05/29/2016       Allergies: No Known Allergies  Past Medical History:  Diagnosis Date  . Diabetes mellitus without complication ([place])   . ED (erectile dysfunction)   . Hyperlipidemia   . Hypertension   . Plantar fasciitis   . Prostatitis     Past Surgical History:  Procedure Laterality Date  . CIRCUMCISION    . COLONOSCOPY      Family History  Problem Relation Age of Onset  . Hepatitis C Mother   . Mesothelioma Father   . Diabetes Father   . Heart disease Father     Social History:  reports that he has quit smoking. he has never used smokeless tobacco. He reports that he drinks alcohol. He reports that he does not use drugs.    Review of Systems     RENAL dysfunction: Creatinine is More recently upper normal   Lab Results  Component Value Date   CREATININE 1.38 10/02/2017   BUN 25 (H) 10/02/2017   NA 140 10/02/2017   K 4.3 10/02/2017   CL 105 10/02/2017   CO2 28 10/02/2017    Lipid history: Has been on Lipitor, has triglyceride level of 141 with PCP in 5/18 with LDL 93  His lipids are followed by PCP    Lab Results  Component Value Date   CHOL 129 12/24/2016   HDL 23.70 (L) 12/24/2016   LDLCALC 97 02/02/2016   LDLDIRECT 75.0 12/24/2016   TRIG 210.0 (H) 12/24/2016   CHOLHDL 5 12/24/2016           Hypertension:Long-standing,  treated with Hyzaar And amlodipine 5 mg daily   He does monitor at home also  BP Readings from Last 3 Encounters:  10/06/17 132/74  08/06/17 120/76  06/05/17 138/84     Most recent eye exam was in 02/2017  Most recent foot exam: 05/2017  Review of Systems    Physical Examination:  BP 132/74   Pulse 70   Ht 5\' 9"  (1.753 m)   Wt 185 lb 9.6 oz (84.2 kg)   SpO2 97%   BMI 27.41 kg/m      ASSESSMENT:  Diabetes type 2, BMI 26 See history of present illness for  discussion of current diabetes management, blood sugar patterns and problems identified  A1c has come down below 7% This is secondary to adding Trulicity to his insulin regimen as above However weight has gone back up He does not remember to check his blood sugars consistently but has not had high readings at bedtime Not clear if he needs insulin at breakfast or lunch but has only one reading around 180 late morning  Discussed with his postprandial readings will be variable based on timing of his pre-meal insulin in the evening, type of meal and may have low normal readings at bedtime because of duration of action of Regular Insulin  Complications: Minimal, microalbumin normal, has a regular eye exams  PLAN:     No  change in insulin regimen with his bedtime Lantus and suppertime regular since he cannot remember to take multiple shots  He agrees to continue Trulicity which has helped  He will check to see if Toujeo max will be less expensive the Lantus and be more convenient with his high-dose regimen  More regular exercise  Try to check more readings after supper and other meals  There are no Patient Instructions on file for this visit.    Ramone Gander 10/06/2017, 8:22 AM   Note: This office note was prepared with Dragon voice recognition system technology. Any transcriptional errors that result from this process are unintentional.

## 2017-10-13 ENCOUNTER — Encounter: Payer: Self-pay | Admitting: Endocrinology

## 2017-10-20 DIAGNOSIS — N183 Chronic kidney disease, stage 3 (moderate): Secondary | ICD-10-CM | POA: Diagnosis not present

## 2017-10-20 DIAGNOSIS — Z0000 Encounter for general adult medical examination without abnormal findings: Secondary | ICD-10-CM | POA: Diagnosis not present

## 2017-10-20 DIAGNOSIS — E1122 Type 2 diabetes mellitus with diabetic chronic kidney disease: Secondary | ICD-10-CM | POA: Diagnosis not present

## 2017-10-20 DIAGNOSIS — K219 Gastro-esophageal reflux disease without esophagitis: Secondary | ICD-10-CM | POA: Diagnosis not present

## 2017-10-20 DIAGNOSIS — I10 Essential (primary) hypertension: Secondary | ICD-10-CM | POA: Diagnosis not present

## 2017-10-20 DIAGNOSIS — F339 Major depressive disorder, recurrent, unspecified: Secondary | ICD-10-CM | POA: Diagnosis not present

## 2017-10-20 DIAGNOSIS — Z1389 Encounter for screening for other disorder: Secondary | ICD-10-CM | POA: Diagnosis not present

## 2017-10-20 DIAGNOSIS — E782 Mixed hyperlipidemia: Secondary | ICD-10-CM | POA: Diagnosis not present

## 2017-10-20 DIAGNOSIS — Z125 Encounter for screening for malignant neoplasm of prostate: Secondary | ICD-10-CM | POA: Diagnosis not present

## 2017-10-26 ENCOUNTER — Encounter: Payer: Self-pay | Admitting: Endocrinology

## 2017-10-27 ENCOUNTER — Other Ambulatory Visit: Payer: Self-pay | Admitting: Endocrinology

## 2017-10-27 MED ORDER — METFORMIN HCL 1000 MG PO TABS: ORAL_TABLET | ORAL | 1 refills | Status: DC

## 2017-11-27 ENCOUNTER — Encounter: Payer: Self-pay | Admitting: Endocrinology

## 2017-11-28 ENCOUNTER — Other Ambulatory Visit: Payer: Self-pay | Admitting: Endocrinology

## 2017-12-01 ENCOUNTER — Other Ambulatory Visit: Payer: Self-pay

## 2017-12-01 MED ORDER — DULAGLUTIDE 0.75 MG/0.5ML ~~LOC~~ SOAJ: SUBCUTANEOUS | 0 refills | Status: DC

## 2017-12-20 ENCOUNTER — Encounter: Payer: Self-pay | Admitting: Endocrinology

## 2017-12-26 ENCOUNTER — Other Ambulatory Visit: Payer: Self-pay | Admitting: Endocrinology

## 2017-12-26 MED ORDER — DULAGLUTIDE 0.75 MG/0.5ML ~~LOC~~ SOAJ: SUBCUTANEOUS | 0 refills | Status: DC

## 2017-12-29 ENCOUNTER — Other Ambulatory Visit: Payer: Self-pay

## 2017-12-29 NOTE — Telephone Encounter (Signed)
Refill for meds requested in [place] handled previously.

## 2018-01-01 DIAGNOSIS — R39198 Other difficulties with micturition: Secondary | ICD-10-CM | POA: Diagnosis not present

## 2018-01-01 DIAGNOSIS — N400 Enlarged prostate without lower urinary tract symptoms: Secondary | ICD-10-CM | POA: Diagnosis not present

## 2018-01-02 ENCOUNTER — Other Ambulatory Visit (INDEPENDENT_AMBULATORY_CARE_PROVIDER_SITE_OTHER): Payer: Medicare HMO

## 2018-01-02 DIAGNOSIS — Z794 Long term (current) use of insulin: Secondary | ICD-10-CM | POA: Diagnosis not present

## 2018-01-02 DIAGNOSIS — E1165 Type 2 diabetes mellitus with hyperglycemia: Secondary | ICD-10-CM

## 2018-01-02 LAB — COMPREHENSIVE METABOLIC PANEL
ALT: 19 U/L (ref 0–53)
AST: 16 U/L (ref 0–37)
Albumin: 4.2 g/dL (ref 3.5–5.2)
Alkaline Phosphatase: 44 U/L (ref 39–117)
BUN: 38 mg/dL — ABNORMAL HIGH (ref 6–23)
CALCIUM: 9.7 mg/dL (ref 8.4–10.5)
CHLORIDE: 103 meq/L (ref 96–112)
CO2: 25 meq/L (ref 19–32)
Creatinine, Ser: 1.78 mg/dL — ABNORMAL HIGH (ref 0.40–1.50)
GFR: 40.22 mL/min — AB (ref >60.00)
GLUCOSE: 155 mg/dL — AB (ref 70–99)
Potassium: 4.4 mEq/L (ref 3.5–5.1)
Sodium: 138 mEq/L (ref 135–145)
Total Bilirubin: 0.5 mg/dL (ref 0.2–1.2)
Total Protein: 6.8 g/dL (ref 6.0–8.3)

## 2018-01-02 LAB — HEMOGLOBIN A1C: Hgb A1c MFr Bld: 7.5 % — ABNORMAL HIGH (ref 4.6–6.5)

## 2018-01-06 ENCOUNTER — Telehealth: Payer: Self-pay | Admitting: Endocrinology

## 2018-01-06 ENCOUNTER — Encounter: Payer: Self-pay | Admitting: Endocrinology

## 2018-01-06 ENCOUNTER — Other Ambulatory Visit: Payer: Self-pay

## 2018-01-06 ENCOUNTER — Ambulatory Visit: Payer: Medicare HMO | Admitting: Endocrinology

## 2018-01-06 VITALS — BP 130/80 | HR 67 | Ht 69.0 in | Wt 183.8 lb

## 2018-01-06 DIAGNOSIS — E1165 Type 2 diabetes mellitus with hyperglycemia: Secondary | ICD-10-CM

## 2018-01-06 DIAGNOSIS — Z794 Long term (current) use of insulin: Secondary | ICD-10-CM | POA: Diagnosis not present

## 2018-01-06 MED ORDER — INSULIN DEGLUDEC 200 UNIT/ML ~~LOC~~ SOPN: 50.0000 [IU] | PEN_INJECTOR | Freq: Every day | SUBCUTANEOUS | 1 refills | Status: DC

## 2018-01-06 NOTE — Patient Instructions (Addendum)
Take 6 R before lunch and 12 at supper   Lantus 50 at nite, at 6 pm  Change R vial every 2 months  MUST CHECK SUGAR AFTER MEALS

## 2018-01-06 NOTE — Progress Notes (Signed)
Patient ID: Joel Webb, male   DOB: [birth year], 72 y.o.   MRN: [id number]           Reason for Appointment: Follow-up for Type 2 Diabetes  Referring physician: Moreen Fowler  History of Present Illness:          Date of diagnosis of type 2 diabetes mellitus: 2007       Background history:   He is unclear when he was diagnosed to have diabetes but was not very symptomatic He had been on metformin alone for several years with reportedly good control Also had previously been very consistent with exercise and diet His blood sugars probably started going up 3-4 years ago and he was given Lantus insulin in addition He was seen in consultation in 4/17 with an A1c of 8.4 and he was started Trulicity in addition to his Lantus and metformin This was stopped in 7/17 because of high out-of-pocket expense  The V-go pump was stopped because responded to uncomfortable and not sticking well  Recent history:   INSULIN regimen is: Lantus  50 units in p.m., Novolin R 10 units at supper   Non-insulin hypoglycemic drugs the patient is taking are: metformin 1 g twice a day, Trulicity 5.09 mg weekly  His A1c was 6.7 previously and now up to 7.5  Current blood sugar patterns and problems identified:  Unclear why his A1c has gone up  As before he is checking his blood sugars only before breakfast and only once after lunch  Occasionally he may forget to take his Lantus at bedtime  Though he has been told that he could take it at suppertime if this is easier to do  However he is trying to be regular with his Novolin R  Difficult to know whether his sugars are controlled after supper as he does not monitor dose  He was able to continue on Trulicity   With this his weight is slightly better but he may have not been consistent with his diet in November and December  He is usually eating a ham SANDWICH at lunchtime and his blood sugar was 189 afterwards without any insulin coverage  His lab  glucose was 155 after breakfast  Side effects from medications have been: None  Compliance with the medical regimen: fair   Hypoglycemia: none    Glucose monitoring:  done less than 1 times a day         Glucometer:  Accu-Chek Blood Glucose readings by download:   Mean values apply above for all meters except median for One Touch  PRE-MEAL Fasting Lunch Dinner Bedtime Overall  Glucose range:  108-161      Mean/median: 120     125    POST-MEAL PC Breakfast PC Lunch PC Dinner  Glucose range:  189    Mean/median:      Previous blood sugar records for comparison:  Mean values apply above for all meters except median for One Touch  PRE-MEAL Fasting Lunch Dinner Bedtime Overall  Glucose range: 69- 118 158   97-176    Mean/median:     129    POST-MEAL PC Breakfast PC Lunch PC Dinner  Glucose range: 163, 182     Mean/median:       Self-care:  Usually tries to limit High-fat foods and not eating out  at lunch now                 Dietician visit, most recent:6/17  Exercise: some Walking, playing golf, at Gym  Weight history:  Wt Readings from Last 3 Encounters:  01/06/18 183 lb 12.8 oz (83.4 kg)  10/06/17 185 lb 9.6 oz (84.2 kg)  08/06/17 180 lb (81.6 kg)    Glycemic control:   Lab Results  Component Value Date   HGBA1C 7.5 (H) 01/02/2018   HGBA1C 6.7 (H) 10/02/2017   HGBA1C 7.8 (H) 06/02/2017   Lab Results  Component Value Date   MICROALBUR 0.9 10/02/2017   LDLCALC 97 02/02/2016   CREATININE 1.78 (H) 01/02/2018   Lab Results  Component Value Date   MICRALBCREAT 1.0 10/02/2017    Lab Results  Component Value Date   FRUCTOSAMINE 266 06/02/2017   FRUCTOSAMINE 233 02/04/2017   FRUCTOSAMINE 229 06/25/2016     Other problems addressed today: See review of systems   Lab on 01/02/2018  Component Date Value Ref Range Status  . Sodium 01/02/2018 138  135 - 145 mEq/L Final  . Potassium 01/02/2018 4.4  3.5 - 5.1 mEq/L Final  . Chloride  01/02/2018 103  96 - 112 mEq/L Final  . CO2 01/02/2018 25  19 - 32 mEq/L Final  . Glucose, Bld 01/02/2018 155* 70 - 99 mg/dL Final  . BUN 01/02/2018 38* 6 - 23 mg/dL Final  . Creatinine, Ser 01/02/2018 1.78* 0.40 - 1.50 mg/dL Final  . Total Bilirubin 01/02/2018 0.5  0.2 - 1.2 mg/dL Final  . Alkaline Phosphatase 01/02/2018 44  39 - 117 U/L Final  . AST 01/02/2018 16  0 - 37 U/L Final  . ALT 01/02/2018 19  0 - 53 U/L Final  . Total Protein 01/02/2018 6.8  6.0 - 8.3 g/dL Final  . Albumin 01/02/2018 4.2  3.5 - 5.2 g/dL Final  . Calcium 01/02/2018 9.7  8.4 - 10.5 mg/dL Final  . GFR 01/02/2018 40.22* >60.00 mL/min Final  . Hgb A1c MFr Bld 01/02/2018 7.5* 4.6 - 6.5 % Final   Glycemic Control Guidelines for People with Diabetes:Non Diabetic:  <6%Goal of Therapy: <7%Additional Action Suggested:  >8%       Allergies as of 01/06/2018   No Known Allergies     Medication List        Accurate as of 01/06/18  8:33 PM. Always use your most recent med list.          amLODipine 5 MG tablet Commonly known as:  NORVASC   aspirin 81 MG chewable tablet Chew by mouth daily. Every other day   atorvastatin 80 MG tablet Commonly known as:  LIPITOR Take 80 mg by mouth daily.   B-D INS SYRINGE 0.5CC/30GX1/2" 30G X 1/2" 0.5 ML Misc Generic drug:  Insulin Syringe-Needle U-100 Use to inject insulin twice daily   Dulaglutide 0.75 MG/0.5ML Sopn Commonly known as:  TRULICITY Inject in the abdominal skin as directed once a week   fenofibrate 160 MG tablet Take 160 mg by mouth daily.   finasteride 5 MG tablet Commonly known as:  PROSCAR   finasteride 5 MG tablet Commonly known as:  PROSCAR   glucose blood test strip Commonly known as:  ACCU-CHEK SMARTVIEW Use to check blood sugar 1 time per day.   Insulin Degludec 200 UNIT/ML Sopn Commonly known as:  TRESIBA FLEXTOUCH Inject 50 Units into the skin daily.   insulin regular 100 units/mL injection Commonly known as:  NOVOLIN R  RELION Inject 8 units at supper   Insulin Syringes (Disposable) U-100 1 ML Misc Use to give 2 insulin injections daily  LANTUS 100 UNIT/ML injection Generic drug:  insulin glargine INJECT 0.5ML UNDER THE SKIN AT BEDTIME   losartan-hydrochlorothiazide 100-12.5 MG tablet Commonly known as:  HYZAAR Take 1 tablet by mouth daily.   metFORMIN 1000 MG tablet Commonly known as:  GLUCOPHAGE TAKE ONE TABLET BY MOUTH TWICE A DAY WITH A MEAL   multivitamin with minerals Tabs tablet Take 1 tablet by mouth daily.   omeprazole 20 MG capsule Commonly known as:  PRILOSEC Take 20 mg by mouth every other day. PRN   Pen Needles 31G X 6 MM Misc Use to inject insulin daily.   tamsulosin 0.4 MG Caps capsule Commonly known as:  FLOMAX Take 0.4 mg by mouth. Reported on 05/29/2016       Allergies: No Known Allergies  Past Medical History:  Diagnosis Date  . Diabetes mellitus without complication ([place])   . ED (erectile dysfunction)   . Hyperlipidemia   . Hypertension   . Plantar fasciitis   . Prostatitis     Past Surgical History:  Procedure Laterality Date  . CIRCUMCISION    . COLONOSCOPY      Family History  Problem Relation Age of Onset  . Hepatitis C Mother   . Mesothelioma Father   . Diabetes Father   . Heart disease Father     Social History:  reports that he has quit smoking. he has never used smokeless tobacco. He reports that he drinks alcohol. He reports that he does not use drugs.    Review of Systems     RENAL dysfunction: Creatinine is continuing to be high    Lab Results  Component Value Date   CREATININE 1.78 (H) 01/02/2018   BUN 38 (H) 01/02/2018   NA 138 01/02/2018   K 4.4 01/02/2018   CL 103 01/02/2018   CO2 25 01/02/2018    Lipid history: Has been on Lipitor, has triglyceride level of 141 with PCP in 5/18 with LDL 93  No recent labs available from PCP    Lab Results  Component Value Date   CHOL 129 12/24/2016   HDL 23.70 (L) 12/24/2016    LDLCALC 97 02/02/2016   LDLDIRECT 75.0 12/24/2016   TRIG 210.0 (H) 12/24/2016   CHOLHDL 5 12/24/2016           Hypertension:Long-standing, treated with Hyzaar And amlodipine 5 mg daily   Well controlled and also good readings at home  BP Readings from Last 3 Encounters:  01/06/18 130/80  10/06/17 132/74  08/06/17 120/76     Most recent eye exam was in 02/2017  Most recent foot exam: 05/2017  Review of Systems    Physical Examination:  BP 130/80 (BP Location: Right Arm, Patient Position: Sitting, Cuff Size: Normal)   Pulse 67   Ht 5\' 9"  (1.753 m)   Wt 183 lb 12.8 oz (83.4 kg)   SpO2 98%   BMI 27.14 kg/m      ASSESSMENT:  Diabetes type 2, BMI 26 See history of present illness for  discussion of current diabetes management, blood sugar patterns and problems identified  A1c has gone higher even though her last time it was 6.7 with taking only mealtime insulin once a day This is despite continuing his Trulicity in addition to Lantus and regular insulin With is not monitoring readings after meals difficult to know how to adjust his mealtime doses but appears that he may need additional doses for his sandwich at lunch  Apparently he has never used insulin pens and is  only using insulin vials, most likely his Lantus is not working consistently or covering 24 hours per  Since his renal function is slightly worse we will forward this to his PCP  PLAN:     He will need to try and take his Lantus at suppertime  Since he may have better 24 control of his basal insulin with TRESIBA and will try this instead of Lantus, starting with the same dose  He will be seeing the diabetes educator to be educated in how to use an insulin pen which he has not done before  And is to start checking sugars after lunch and supper  For now he will start taking 6 units of regular insulin before eating a sandwich at lunchtime  He may need to set alarm to check his blood sugars after  meals  Patient Instructions  Take 6 R before lunch and 12 at supper   Lantus 50 at nite, at 6 pm  Change R vial every 2 months  MUST CHECK SUGAR AFTER MEALS       Elayne Snare 01/06/2018, 8:33 PM   Note: This office note was prepared with Dragon voice recognition system technology. Any transcriptional errors that result from this process are unintentional.

## 2018-01-07 ENCOUNTER — Encounter: Payer: Self-pay | Admitting: Endocrinology

## 2018-01-08 MED ORDER — PEN NEEDLES 31G X 6 MM MISC: 2 refills | Status: DC

## 2018-01-08 MED ORDER — GLUCOSE BLOOD VI STRP: ORAL_STRIP | 3 refills | Status: DC

## 2018-01-08 NOTE — Telephone Encounter (Signed)
error 

## 2018-01-11 ENCOUNTER — Encounter: Payer: Self-pay | Admitting: Endocrinology

## 2018-01-12 MED ORDER — INSULIN REGULAR HUMAN 100 UNIT/ML IJ SOLN: INTRAMUSCULAR | 4 refills | Status: DC

## 2018-01-19 ENCOUNTER — Other Ambulatory Visit: Payer: Self-pay | Admitting: Endocrinology

## 2018-03-06 ENCOUNTER — Encounter: Payer: Self-pay | Admitting: Endocrinology

## 2018-03-15 DIAGNOSIS — S60552A Superficial foreign body of left hand, initial encounter: Secondary | ICD-10-CM | POA: Diagnosis not present

## 2018-03-22 ENCOUNTER — Encounter: Payer: Self-pay | Admitting: Endocrinology

## 2018-03-23 ENCOUNTER — Other Ambulatory Visit: Payer: Self-pay

## 2018-03-23 MED ORDER — INSULIN DEGLUDEC 200 UNIT/ML ~~LOC~~ SOPN: 50.0000 [IU] | PEN_INJECTOR | Freq: Every day | SUBCUTANEOUS | 1 refills | Status: DC

## 2018-04-03 ENCOUNTER — Other Ambulatory Visit (INDEPENDENT_AMBULATORY_CARE_PROVIDER_SITE_OTHER): Payer: Medicare HMO

## 2018-04-03 DIAGNOSIS — Z794 Long term (current) use of insulin: Secondary | ICD-10-CM | POA: Diagnosis not present

## 2018-04-03 DIAGNOSIS — E1165 Type 2 diabetes mellitus with hyperglycemia: Secondary | ICD-10-CM | POA: Diagnosis not present

## 2018-04-03 LAB — LDL CHOLESTEROL, DIRECT: LDL DIRECT: 118 mg/dL

## 2018-04-03 LAB — COMPREHENSIVE METABOLIC PANEL
ALT: 20 U/L (ref 0–53)
AST: 21 U/L (ref 0–37)
Albumin: 4.1 g/dL (ref 3.5–5.2)
Alkaline Phosphatase: 43 U/L (ref 39–117)
BUN: 40 mg/dL — AB (ref 6–23)
CO2: 26 meq/L (ref 19–32)
CREATININE: 1.65 mg/dL — AB (ref 0.40–1.50)
Calcium: 9.5 mg/dL (ref 8.4–10.5)
Chloride: 104 mEq/L (ref 96–112)
GFR: 43.87 mL/min — AB (ref >60.00)
GLUCOSE: 183 mg/dL — AB (ref 70–99)
Potassium: 4.9 mEq/L (ref 3.5–5.1)
Sodium: 139 mEq/L (ref 135–145)
Total Bilirubin: 0.4 mg/dL (ref 0.2–1.2)
Total Protein: 6.9 g/dL (ref 6.0–8.3)

## 2018-04-03 LAB — HEMOGLOBIN A1C: Hgb A1c MFr Bld: 6.2 % (ref 4.6–6.5)

## 2018-04-06 ENCOUNTER — Ambulatory Visit: Payer: Medicare HMO | Admitting: Endocrinology

## 2018-04-06 ENCOUNTER — Encounter: Payer: Self-pay | Admitting: Endocrinology

## 2018-04-06 VITALS — BP 112/70 | HR 85 | Ht 69.0 in | Wt 183.2 lb

## 2018-04-06 DIAGNOSIS — E1165 Type 2 diabetes mellitus with hyperglycemia: Secondary | ICD-10-CM | POA: Diagnosis not present

## 2018-04-06 DIAGNOSIS — E782 Mixed hyperlipidemia: Secondary | ICD-10-CM | POA: Diagnosis not present

## 2018-04-06 DIAGNOSIS — I10 Essential (primary) hypertension: Secondary | ICD-10-CM

## 2018-04-06 DIAGNOSIS — Z794 Long term (current) use of insulin: Secondary | ICD-10-CM

## 2018-04-06 DIAGNOSIS — N289 Disorder of kidney and ureter, unspecified: Secondary | ICD-10-CM

## 2018-04-06 NOTE — Progress Notes (Signed)
Patient ID: Joel Webb, male   DOB: 08/09/1946, 72 y.o.   MRN: [id number]           Reason for Appointment: Follow-up for Type 2 Diabetes  Referring physician: Moreen Fowler  History of Present Illness:          Date of diagnosis of type 2 diabetes mellitus: 2007       Background history:   He is unclear when he was diagnosed to have diabetes but was not very symptomatic He had been on metformin alone for several years with reportedly good control Also had previously been very consistent with exercise and diet His blood sugars probably started going up 3-4 years ago and he was given Lantus insulin in addition He was seen in consultation in 4/17 with an A1c of 8.4 and he was started Trulicity in addition to his Lantus and metformin This was stopped in 7/17 because of high out-of-pocket expense  The V-go pump was stopped because responded to uncomfortable and not sticking well  Recent history:   INSULIN regimen is: Tresiba 50 units in p.m., Novolin R 8 units at supper   Non-insulin hypoglycemic drugs the patient is taking are: metformin 1 g twice a day, Trulicity 5.42 mg weekly  His A1c was 1.5 previously and now down to 6.2 surprisingly   Current blood sugar patterns and problems identified:  The only change made on his last visit was switching from Lantus to [place] at the same dose  His blood sugars are very dramatically improved with switching from Lantus to [place] after his last visit  However despite starting to have hypoglycemia in the mornings including symptomatic episodes he did not report this and still continues to have low sugars in the mornings  He now says that he will not have any symptoms even when his blood sugar is down to 55 recently and again as before he is forgetting to check his blood sugars after meals a few readings he have nonfasting generally near normal  He has overall increase his activity level and exercise compared to his last visit, he tends  to not do much during the winter months  He was also asked to take insulin at lunchtime to cover his sandwich when he has this but he forgot to do this  He says he is usually taking regular insulin at suppertime, 8 units now compared to 10 units previously he has on his prescription  Previously tending to forget to take the Lantus but now is apparently fairly regular with his Tyler Aas  He is able to continue on Trulicity, previously concerned about the cost  His weight is about the same  His lab glucose was 183 after breakfast however and he probably had a sweet muffin at that time  Side effects from medications have been: None  Compliance with the medical regimen: fair   Hypoglycemia: none    Glucose monitoring:  done less than 1 times a day         Glucometer:  Accu-Chek Blood Glucose readings by download:   Mean values apply above for all meters except median for One Touch  PRE-MEAL Fasting Lunch Dinner Bedtime Overall  Glucose range:  55-117  76     Mean/median:  74     85   POST-MEAL PC Breakfast PC Lunch PC Dinner  Glucose range:    92-125  Mean/median:       Self-care:  Usually tries to limit High-fat foods and not eating out  at lunch now                 [place] visit, most recent:6/17               Exercise: Walking, playing golf, at [facility] 3/7  Weight history:  Wt Readings from Last 3 Encounters:  04/06/18 183 lb 3.2 oz (83.1 kg)  01/06/18 183 lb 12.8 oz (83.4 kg)  10/06/17 185 lb 9.6 oz (84.2 kg)    Glycemic control:   Lab Results  Component Value Date   HGBA1C 6.2 04/03/2018   HGBA1C 7.5 (H) 01/02/2018   HGBA1C 6.7 (H) 10/02/2017   Lab Results  Component Value Date   MICROALBUR 0.9 10/02/2017   LDLCALC 97 02/02/2016   CREATININE 1.65 (H) 04/03/2018   Lab Results  Component Value Date   MICRALBCREAT 1.0 10/02/2017    Lab Results  Component Value Date   FRUCTOSAMINE 266 06/02/2017   FRUCTOSAMINE 233 02/04/2017   FRUCTOSAMINE 229  06/25/2016     Other problems addressed today: See review of systems   Lab on 04/03/2018  Component Date Value Ref Range Status  . Direct LDL 04/03/2018 118.0  mg/dL Final   Optimal:  <100 mg/dLNear or Above Optimal:  100-129 mg/dLBorderline High:  130-159 mg/dLHigh:  160-189 mg/dLVery High:  >190 mg/dL  . Sodium 04/03/2018 139  135 - 145 mEq/L Final  . Potassium 04/03/2018 4.9  3.5 - 5.1 mEq/L Final  . Chloride 04/03/2018 104  96 - 112 mEq/L Final  . CO2 04/03/2018 26  19 - 32 mEq/L Final  . Glucose, Bld 04/03/2018 183* 70 - 99 mg/dL Final  . BUN 04/03/2018 40* 6 - 23 mg/dL Final  . Creatinine, Ser 04/03/2018 1.65* 0.40 - 1.50 mg/dL Final  . Total Bilirubin 04/03/2018 0.4  0.2 - 1.2 mg/dL Final  . Alkaline Phosphatase 04/03/2018 43  39 - 117 U/L Final  . AST 04/03/2018 21  0 - 37 U/L Final  . ALT 04/03/2018 20  0 - 53 U/L Final  . Total Protein 04/03/2018 6.9  6.0 - 8.3 g/dL Final  . Albumin 04/03/2018 4.1  3.5 - 5.2 g/dL Final  . Calcium 04/03/2018 9.5  8.4 - 10.5 mg/dL Final  . GFR 04/03/2018 43.87* >60.00 mL/min Final  . Hgb A1c MFr Bld 04/03/2018 6.2  4.6 - 6.5 % Final   Glycemic Control Guidelines for People with Diabetes:Non Diabetic:  <6%Goal of Therapy: <7%Additional Action Suggested:  >8%       Allergies as of 04/06/2018   No Known Allergies     Medication List        Accurate as of 04/06/18 11:59 PM. Always use your most recent med list.          amLODipine 5 MG tablet Commonly known as:  NORVASC   aspirin 81 MG chewable tablet Chew by mouth daily. Every other day   atorvastatin 80 MG tablet Commonly known as:  LIPITOR Take 80 mg by mouth daily.   fenofibrate 160 MG tablet Take 160 mg by mouth daily.   finasteride 5 MG tablet Commonly known as:  PROSCAR   finasteride 5 MG tablet Commonly known as:  PROSCAR   glucose blood test strip Commonly known as:  ACCU-CHEK SMARTVIEW Use to check blood sugar 1 time per day.   Insulin Degludec 200  UNIT/ML Sopn Commonly known as:  TRESIBA FLEXTOUCH Inject 50 Units into the skin daily.   insulin regular 100 units/mL injection Commonly known as:  NOVOLIN R RELION Inject 8 units at supper   Insulin Syringes (Disposable) U-100 1 ML Misc Use to give 2 insulin injections daily   losartan-hydrochlorothiazide 100-12.5 MG tablet Commonly known as:  HYZAAR Take 1 tablet by mouth daily.   metFORMIN 1000 MG tablet Commonly known as:  GLUCOPHAGE TAKE ONE TABLET BY MOUTH TWICE A DAY WITH A MEAL   multivitamin with minerals Tabs tablet Take 1 tablet by mouth daily.   omeprazole 20 MG capsule Commonly known as:  PRILOSEC Take 20 mg by mouth every other day. PRN   Pen Needles 31G X 6 MM Misc Use to inject insulin daily.   tamsulosin 0.4 MG Caps capsule Commonly known as:  FLOMAX Take 0.4 mg by mouth. Reported on 7/42/5956   TRULICITY 3.87 FI/4.3PI Sopn Generic drug:  Dulaglutide INJECT 0.75 MG UNDER THE SKIN ONCE WEEKLY AS DIRECTED       Allergies: No Known Allergies  Past Medical History:  Diagnosis Date  . Diabetes mellitus without complication ([place])   . ED (erectile dysfunction)   . Hyperlipidemia   . Hypertension   . Plantar fasciitis   . Prostatitis     Past Surgical History:  Procedure Laterality Date  . CIRCUMCISION    . COLONOSCOPY      Family History  Problem Relation Age of Onset  . Hepatitis C Mother   . Mesothelioma Father   . Diabetes Father   . Heart disease Father     Social History:  reports that he has quit smoking. He has never used smokeless tobacco. He reports that he drinks alcohol. He reports that he does not use drugs.    Review of Systems     RENAL dysfunction: Creatinine is continuing to be high Has not had nephrology consultation   Lab Results  Component Value Date   CREATININE 1.65 (H) 04/03/2018   BUN 40 (H) 04/03/2018   NA 139 04/03/2018   K 4.9 04/03/2018   CL 104 04/03/2018   CO2 26 04/03/2018    Lipid history:  Has been on Lipitor, has triglyceride level of 141 with PCP in 5/18 with LDL 93  Labs show LDL 118    Lab Results  Component Value Date   CHOL 129 12/24/2016   HDL 23.70 (L) 12/24/2016   LDLCALC 97 02/02/2016   LDLDIRECT 118.0 04/03/2018   TRIG 210.0 (H) 12/24/2016   CHOLHDL 5 12/24/2016           Hypertension:Long-standing, treated with Hyzaar, amlodipine 5 mg daily, followed by PCP   Well controlled and also good readings at home, systolic <951, no lightheadedness  BP Readings from Last 3 Encounters:  04/06/18 112/70  01/06/18 130/80  10/06/17 132/74     Most recent eye exam was in 02/2017  Most recent foot exam: 05/2017  Review of Systems    Physical Examination:  BP 112/70 (BP Location: Left Arm, Patient Position: Sitting, Cuff Size: Normal)   Pulse 85   Ht 5\' 9"  (1.753 m)   Wt 183 lb 3.2 oz (83.1 kg)   SpO2 95%   BMI 27.05 kg/m    Standing blood pressure slightly lower than sitting blood pressure No edema   ASSESSMENT:  Diabetes type 2, BMI 26 See history of present illness for  discussion of current diabetes management, blood sugar patterns and problems identified  A1c has come down dramatically with switching from Lantus to [place] This may be partly related to better efficacy but also more consistent insulin action  over 24 hours since he would occasionally forget his Lantus in the past also However he is getting fairly frequent hypoglycemia overnight with his dose of 50 units He has not reported these low sugars even though previously he was symptomatic with this and blood sugars recently have been as low as 55 Does not appear to have low sugars after supper 50 with reducing his NovoLog down to 8 units  With his A1c down to 6.2 most likely he is not having significant postprandial hyperglycemia especially with continuing Trulicity However he does need to monitor more consistently after meals   HYPERLIPIDEMIA: We will forward labs to PCP, his LDL is  above 100  HYPERTENSION: His blood pressure is low normal including standing and improved from being more active and eating lower sodium foods   Renal dysfunction: Currently his blood pressure is low normal and may be having reduced renal blood flow with losartan also  PLAN:     He will need to reduce his [place] Aas down by 6 units for now  Also when he is traveling and being more active he may need to reduce it by another 4 units to prevent hypoglycemia  No change in Trulicity, mealtime insulin  He wants to try and stop Trulicity eventually but discussed that he may still need to use this for controlling postprandial hyperglycemia, discussed that his lab glucose was high after breakfast and we do not have postprandial readings after breakfast and lunch at all  Consistent exercise  Avoid high carbohydrate foods and snacks  LOSARTAN HCT will be reduced to half a tablet for now and he will continue to follow-up with PCP  Needs more aggressive lipid-lowering therapy, will defer to PCP  Patient Instructions  Tresiba 44   Regulate Tresiba dose based on am sugars, target 80-120  More sugars after meals, set alarm for this  Reduce Losartan to 1/2 tablet  See PCP  Counseling time on subjects discussed in assessment and plan sections is over 50% of today's 25 minute visit    Elayne Snare 04/07/2018, 8:07 AM   Note: This office note was prepared with Dragon voice recognition system technology. Any transcriptional errors that result from this process are unintentional.

## 2018-04-06 NOTE — Patient Instructions (Addendum)
Tresiba 44   Regulate Tresiba dose based on am sugars, target 80-120  More sugars after meals, set alarm for this  Reduce Losartan to 1/2 tablet  See PCP

## 2018-05-11 DIAGNOSIS — N183 Chronic kidney disease, stage 3 (moderate): Secondary | ICD-10-CM | POA: Diagnosis not present

## 2018-05-11 DIAGNOSIS — M25512 Pain in left shoulder: Secondary | ICD-10-CM | POA: Diagnosis not present

## 2018-05-11 DIAGNOSIS — D692 Other nonthrombocytopenic purpura: Secondary | ICD-10-CM | POA: Diagnosis not present

## 2018-05-11 DIAGNOSIS — Z6828 Body mass index (BMI) 28.0-28.9, adult: Secondary | ICD-10-CM | POA: Diagnosis not present

## 2018-05-15 ENCOUNTER — Encounter: Payer: Self-pay | Admitting: Endocrinology

## 2018-05-18 ENCOUNTER — Encounter: Payer: Self-pay | Admitting: Endocrinology

## 2018-05-19 ENCOUNTER — Other Ambulatory Visit: Payer: Self-pay

## 2018-05-19 MED ORDER — INSULIN REGULAR HUMAN 100 UNIT/ML IJ SOLN: INTRAMUSCULAR | 4 refills | Status: DC

## 2018-05-29 DIAGNOSIS — M25512 Pain in left shoulder: Secondary | ICD-10-CM | POA: Diagnosis not present

## 2018-05-29 DIAGNOSIS — I10 Essential (primary) hypertension: Secondary | ICD-10-CM | POA: Diagnosis not present

## 2018-06-07 ENCOUNTER — Encounter: Payer: Self-pay | Admitting: Endocrinology

## 2018-06-08 ENCOUNTER — Other Ambulatory Visit: Payer: Self-pay

## 2018-06-08 ENCOUNTER — Encounter: Payer: Self-pay | Admitting: Endocrinology

## 2018-06-08 MED ORDER — INSULIN DEGLUDEC 200 UNIT/ML ~~LOC~~ SOPN: 50.0000 [IU] | PEN_INJECTOR | Freq: Every day | SUBCUTANEOUS | 1 refills | Status: DC

## 2018-06-08 MED ORDER — METFORMIN HCL 1000 MG PO TABS: ORAL_TABLET | ORAL | 1 refills | Status: DC

## 2018-06-09 ENCOUNTER — Other Ambulatory Visit: Payer: Self-pay

## 2018-06-09 ENCOUNTER — Encounter: Payer: Self-pay | Admitting: Endocrinology

## 2018-06-09 MED ORDER — INSULIN DEGLUDEC 200 UNIT/ML ~~LOC~~ SOPN: 50.0000 [IU] | PEN_INJECTOR | Freq: Every day | SUBCUTANEOUS | 1 refills | Status: DC

## 2018-06-13 DIAGNOSIS — N390 Urinary tract infection, site not specified: Secondary | ICD-10-CM | POA: Diagnosis not present

## 2018-06-13 DIAGNOSIS — R300 Dysuria: Secondary | ICD-10-CM | POA: Diagnosis not present

## 2018-06-23 DIAGNOSIS — E109 Type 1 diabetes mellitus without complications: Secondary | ICD-10-CM | POA: Diagnosis not present

## 2018-06-23 DIAGNOSIS — Z0100 Encounter for examination of eyes and vision without abnormal findings: Secondary | ICD-10-CM | POA: Diagnosis not present

## 2018-06-23 DIAGNOSIS — H2510 Age-related nuclear cataract, unspecified eye: Secondary | ICD-10-CM | POA: Diagnosis not present

## 2018-06-23 DIAGNOSIS — H5200 Hypermetropia, unspecified eye: Secondary | ICD-10-CM | POA: Diagnosis not present

## 2018-07-03 DIAGNOSIS — E1122 Type 2 diabetes mellitus with diabetic chronic kidney disease: Secondary | ICD-10-CM | POA: Diagnosis not present

## 2018-07-03 DIAGNOSIS — F339 Major depressive disorder, recurrent, unspecified: Secondary | ICD-10-CM | POA: Diagnosis not present

## 2018-07-03 DIAGNOSIS — N400 Enlarged prostate without lower urinary tract symptoms: Secondary | ICD-10-CM | POA: Diagnosis not present

## 2018-07-03 DIAGNOSIS — N183 Chronic kidney disease, stage 3 (moderate): Secondary | ICD-10-CM | POA: Diagnosis not present

## 2018-07-03 DIAGNOSIS — D692 Other nonthrombocytopenic purpura: Secondary | ICD-10-CM | POA: Diagnosis not present

## 2018-07-03 DIAGNOSIS — E782 Mixed hyperlipidemia: Secondary | ICD-10-CM | POA: Diagnosis not present

## 2018-07-03 DIAGNOSIS — I10 Essential (primary) hypertension: Secondary | ICD-10-CM | POA: Diagnosis not present

## 2018-07-03 DIAGNOSIS — K219 Gastro-esophageal reflux disease without esophagitis: Secondary | ICD-10-CM | POA: Diagnosis not present

## 2018-07-03 DIAGNOSIS — R399 Unspecified symptoms and signs involving the genitourinary system: Secondary | ICD-10-CM | POA: Diagnosis not present

## 2018-07-06 ENCOUNTER — Other Ambulatory Visit (INDEPENDENT_AMBULATORY_CARE_PROVIDER_SITE_OTHER): Payer: Medicare HMO

## 2018-07-06 DIAGNOSIS — E1165 Type 2 diabetes mellitus with hyperglycemia: Secondary | ICD-10-CM

## 2018-07-06 DIAGNOSIS — Z794 Long term (current) use of insulin: Secondary | ICD-10-CM

## 2018-07-06 LAB — HEMOGLOBIN A1C: HEMOGLOBIN A1C: 6.9 % — AB (ref 4.6–6.5)

## 2018-07-06 LAB — BASIC METABOLIC PANEL
BUN: 34 mg/dL — AB (ref 6–23)
CALCIUM: 9.9 mg/dL (ref 8.4–10.5)
CO2: 27 mEq/L (ref 19–32)
CREATININE: 1.48 mg/dL (ref 0.40–1.50)
Chloride: 105 mEq/L (ref 96–112)
GFR: 49.7 mL/min — ABNORMAL LOW (ref >60.00)
Glucose, Bld: 129 mg/dL — ABNORMAL HIGH (ref 70–99)
Potassium: 4.4 mEq/L (ref 3.5–5.1)
Sodium: 140 mEq/L (ref 135–145)

## 2018-07-09 ENCOUNTER — Ambulatory Visit: Payer: Medicare HMO | Admitting: Endocrinology

## 2018-07-31 ENCOUNTER — Encounter: Payer: Self-pay | Admitting: Endocrinology

## 2018-07-31 ENCOUNTER — Ambulatory Visit: Payer: Medicare HMO | Admitting: Endocrinology

## 2018-07-31 VITALS — BP 116/69 | HR 75 | Temp 98.8°F | Ht 69.0 in | Wt 189.2 lb

## 2018-07-31 DIAGNOSIS — Z23 Encounter for immunization: Secondary | ICD-10-CM

## 2018-07-31 DIAGNOSIS — E1165 Type 2 diabetes mellitus with hyperglycemia: Secondary | ICD-10-CM

## 2018-07-31 DIAGNOSIS — Z794 Long term (current) use of insulin: Secondary | ICD-10-CM | POA: Diagnosis not present

## 2018-07-31 NOTE — Progress Notes (Signed)
Patient ID: Joel Webb, male   DOB: 01-28-1946, 72 y.o.   MRN: [id number]           Reason for Appointment: Follow-up for Type 2 Diabetes  Referring physician: Moreen Fowler  History of Present Illness:          Date of diagnosis of type 2 diabetes mellitus: 2007       Background history:   He is unclear when he was diagnosed to have diabetes but was not very symptomatic He had been on metformin alone for several years with reportedly good control Also had previously been very consistent with exercise and diet His blood sugars probably started going up 3-4 years ago and he was given Lantus insulin in addition He was seen in consultation in 4/17 with an A1c of 8.4 and he was started Trulicity in addition to his Lantus and metformin This was stopped in 7/17 because of high out-of-pocket expense  The V-go pump was stopped because responded to uncomfortable and not sticking well  Recent history:   INSULIN regimen is: [place] 44 units in p.m., Novolin R 8 units at supper  Non-insulin hypoglycemic drugs the patient is taking are: metformin 1 g twice a day, Trulicity 8.46 mg weekly  His A1c is 6.9 as of 8/19 although previously 6.2   Current blood sugar patterns and problems identified:   His blood sugars on his last visit were as low as 55 and his Tyler Aas was reduced by 6 units, this appeared to be working much better than Lantus  However he is checking his blood sugars very infrequently  No consistent pattern is seen because he has sporadic high readings both morning, afternoon and evening  He thinks that he is getting too many carbohydrates frequently including rice and is not able to change his diet  Has gained 6 pounds  This is despite trying to go to the gym about 3 days a week or walking  However he is taking his insulin as directed including regular insulin before suppertime  Side effects from medications have been: None  Compliance with the medical regimen: fair     Hypoglycemia: none    Glucose monitoring:  done less than 1 times a day         Glucometer:  Accu-Chek Blood Glucose readings by download:   AVERAGE 149, has 8 readings Blood sugar range 111-194 Fasting glucose range 113-193 and nonfasting range 111-194   Self-care:  Usually tries to limit High-fat foods and not eating out  at lunch now                 Dietician visit, most recent:6/17               Exercise: Walking at [facility] 3/7 days  Weight history:  Wt Readings from Last 3 Encounters:  07/31/18 189 lb 3.2 oz (85.8 kg)  04/06/18 183 lb 3.2 oz (83.1 kg)  01/06/18 183 lb 12.8 oz (83.4 kg)    Glycemic control:   Lab Results  Component Value Date   HGBA1C 6.9 (H) 07/06/2018   HGBA1C 6.2 04/03/2018   HGBA1C 7.5 (H) 01/02/2018   Lab Results  Component Value Date   MICROALBUR 0.9 10/02/2017   LDLCALC 97 02/02/2016   CREATININE 1.48 07/06/2018   Lab Results  Component Value Date   MICRALBCREAT 1.0 10/02/2017    Lab Results  Component Value Date   FRUCTOSAMINE 266 06/02/2017   FRUCTOSAMINE 233 02/04/2017   FRUCTOSAMINE 229 06/25/2016  Other problems addressed today: See review of systems   No visits with results within 1 Week(s) from this visit.  Latest known visit with results is:  Lab on 07/06/2018  Component Date Value Ref Range Status  . Sodium 07/06/2018 140  135 - 145 mEq/L Final  . Potassium 07/06/2018 4.4  3.5 - 5.1 mEq/L Final  . Chloride 07/06/2018 105  96 - 112 mEq/L Final  . CO2 07/06/2018 27  19 - 32 mEq/L Final  . Glucose, Bld 07/06/2018 129* 70 - 99 mg/dL Final  . BUN 07/06/2018 34* 6 - 23 mg/dL Final  . Creatinine, Ser 07/06/2018 1.48  0.40 - 1.50 mg/dL Final  . Calcium 07/06/2018 9.9  8.4 - 10.5 mg/dL Final  . GFR 07/06/2018 49.70* >60.00 mL/min Final  . Hgb A1c MFr Bld 07/06/2018 6.9* 4.6 - 6.5 % Final   Glycemic Control Guidelines for People with Diabetes:Non Diabetic:  <6%Goal of Therapy: <7%Additional Action Suggested:  >8%        Allergies as of 07/31/2018   No Known Allergies     Medication List        Accurate as of 07/31/18 12:59 PM. Always use your most recent med list.          amLODipine 5 MG tablet Commonly known as:  NORVASC   atorvastatin 80 MG tablet Commonly known as:  LIPITOR Take 80 mg by mouth daily.   finasteride 5 MG tablet Commonly known as:  PROSCAR   glucose blood test strip Use to check blood sugar 1 time per day.   Insulin Degludec 200 UNIT/ML Sopn Inject 50 Units into the skin daily.   insulin regular 100 units/mL injection Commonly known as:  NOVOLIN R,HUMULIN R Inject 8 units at supper   Insulin Syringes (Disposable) U-100 1 ML Misc Use to give 2 insulin injections daily   losartan-hydrochlorothiazide 100-12.5 MG tablet Commonly known as:  HYZAAR Take 1 tablet by mouth daily.   metFORMIN 1000 MG tablet Commonly known as:  GLUCOPHAGE TAKE ONE TABLET BY MOUTH TWICE A DAY WITH A MEAL   multivitamin with minerals Tabs tablet Take 1 tablet by mouth daily.   omeprazole 20 MG capsule Commonly known as:  PRILOSEC Take 20 mg by mouth every other day. PRN   Pen Needles 31G X 6 MM Misc Use to inject insulin daily.   tamsulosin 0.4 MG Caps capsule Commonly known as:  FLOMAX Take 0.4 mg by mouth. Reported on 5/73/2202   TRULICITY 5.42 HC/6.2BJ Sopn Generic drug:  Dulaglutide INJECT 0.75 MG UNDER THE SKIN ONCE WEEKLY AS DIRECTED       Allergies: No Known Allergies  Past Medical History:  Diagnosis Date  . Diabetes mellitus without complication ([place])   . ED (erectile dysfunction)   . Hyperlipidemia   . Hypertension   . Plantar fasciitis   . Prostatitis     Past Surgical History:  Procedure Laterality Date  . CIRCUMCISION    . COLONOSCOPY      Family History  Problem Relation Age of Onset  . Hepatitis C Mother   . Mesothelioma Father   . Diabetes Father   . Heart disease Father     Social History:  reports that he has quit smoking. He  has never used smokeless tobacco. He reports that he drinks alcohol. He reports that he does not use drugs.    Review of Systems     RENAL dysfunction: Creatinine is improved with reducing his losartan dose  Lab Results  Component Value Date   CREATININE 1.48 07/06/2018   BUN 34 (H) 07/06/2018   NA 140 07/06/2018   K 4.4 07/06/2018   CL 105 07/06/2018   CO2 27 07/06/2018    Lipid history: Has been on Lipitor, has triglyceride level of 141 with PCP in 5/18 with LDL 93  Labs show LDL 104 done by PCP in 8/19    Lab Results  Component Value Date   CHOL 129 12/24/2016   HDL 23.70 (L) 12/24/2016   LDLCALC 97 02/02/2016   LDLDIRECT 118.0 04/03/2018   TRIG 210.0 (H) 12/24/2016   CHOLHDL 5 12/24/2016           Hypertension:Long-standing, treated with Hyzaar, amlodipine 5 mg daily, recently followed by PCP On his last visit his losartan was reduced to half a tablet   He also has good readings at home, systolic 161, no lightheadedness recently  BP Readings from Last 3 Encounters:  07/31/18 116/69  04/06/18 112/70  01/06/18 130/80     Most recent eye exam was in 02/2017  Most recent foot exam: 9/19  Review of Systems    Physical Examination:  BP 116/69 (BP Location: Left Arm, Patient Position: Sitting, Cuff Size: Normal)   Pulse 75   Temp 98.8 F (37.1 C) (Oral)   Ht 5\' 9"  (1.753 m)   Wt 189 lb 3.2 oz (85.8 kg)   SpO2 96%   BMI 27.94 kg/m   Diabetic Foot Exam - Simple   Simple Foot Form Diabetic Foot exam was performed with the following findings:  Yes   Visual Inspection No deformities, no ulcerations, no other skin breakdown bilaterally:  Yes Sensation Testing Intact to touch and monofilament testing bilaterally:  Yes Pulse Check Posterior Tibialis and Dorsalis pulse intact bilaterally:  Yes Comments       ASSESSMENT:  Diabetes type 2, BMI 28  See history of present illness for  discussion of current diabetes management, blood sugar patterns  and problems identified  His A1c is 6.9 although previously was down to 6.2 because of tendency to hypoglycemia  He has no consistent blood sugar pattern and has some normal readings as well as high readings at different times This is dependent on his compliance with diet Also less active this summer He does take all his diabetes medications including suppertime mealtime dose and Trulicity regularly   HYPERLIPIDEMIA: LDL over 100, followed by PCP May do better with Crestor compared to Lipitor  HYPERTENSION: His blood pressure is not as low with reducing losartan  Mild CKD: Creatinine better with reducing losartan  PLAN:     To check blood sugar more consistently including after meals  No change in medications at this time  Needs to cut back on portions of carbohydrates  Regular walking  High-dose influenza vaccine given  There are no Patient Instructions on file for this visit.     Elayne Snare 07/31/2018, 12:59 PM   Note: This office note was prepared with Dragon voice recognition system technology. Any transcriptional errors that result from this process are unintentional.

## 2018-08-19 MED ORDER — DULAGLUTIDE 0.75 MG/0.5ML ~~LOC~~ SOAJ: SUBCUTANEOUS | 3 refills | Status: DC

## 2018-08-28 ENCOUNTER — Other Ambulatory Visit: Payer: Self-pay

## 2018-08-28 MED ORDER — INSULIN DEGLUDEC 200 UNIT/ML ~~LOC~~ SOPN: 50.0000 [IU] | PEN_INJECTOR | Freq: Every day | SUBCUTANEOUS | 1 refills | Status: DC

## 2018-09-12 ENCOUNTER — Other Ambulatory Visit: Payer: Self-pay | Admitting: Endocrinology

## 2018-09-16 ENCOUNTER — Other Ambulatory Visit: Payer: Self-pay

## 2018-09-16 MED ORDER — "BD INSULIN SYRINGE U/F 31G X 5/16"" 1 ML MISC": 1 refills | Status: DC

## 2018-09-24 DIAGNOSIS — L509 Urticaria, unspecified: Secondary | ICD-10-CM | POA: Diagnosis not present

## 2018-10-19 ENCOUNTER — Other Ambulatory Visit: Payer: Self-pay

## 2018-10-19 MED ORDER — INSULIN DEGLUDEC 200 UNIT/ML ~~LOC~~ SOPN: 50.0000 [IU] | PEN_INJECTOR | Freq: Every day | SUBCUTANEOUS | 1 refills | Status: DC

## 2018-10-19 MED ORDER — DULAGLUTIDE 0.75 MG/0.5ML ~~LOC~~ SOAJ: SUBCUTANEOUS | 3 refills | Status: DC

## 2018-10-28 ENCOUNTER — Other Ambulatory Visit: Payer: Self-pay

## 2018-10-28 MED ORDER — PEN NEEDLES 31G X 6 MM MISC: 2 refills | Status: DC

## 2018-10-29 DIAGNOSIS — N400 Enlarged prostate without lower urinary tract symptoms: Secondary | ICD-10-CM | POA: Diagnosis not present

## 2018-10-29 DIAGNOSIS — E782 Mixed hyperlipidemia: Secondary | ICD-10-CM | POA: Diagnosis not present

## 2018-10-29 DIAGNOSIS — I10 Essential (primary) hypertension: Secondary | ICD-10-CM | POA: Diagnosis not present

## 2018-10-29 DIAGNOSIS — Z0000 Encounter for general adult medical examination without abnormal findings: Secondary | ICD-10-CM | POA: Diagnosis not present

## 2018-10-29 DIAGNOSIS — N183 Chronic kidney disease, stage 3 (moderate): Secondary | ICD-10-CM | POA: Diagnosis not present

## 2018-10-29 DIAGNOSIS — K219 Gastro-esophageal reflux disease without esophagitis: Secondary | ICD-10-CM | POA: Diagnosis not present

## 2018-10-29 DIAGNOSIS — E1122 Type 2 diabetes mellitus with diabetic chronic kidney disease: Secondary | ICD-10-CM | POA: Diagnosis not present

## 2018-10-29 DIAGNOSIS — F339 Major depressive disorder, recurrent, unspecified: Secondary | ICD-10-CM | POA: Diagnosis not present

## 2018-10-29 DIAGNOSIS — H029 Unspecified disorder of eyelid: Secondary | ICD-10-CM | POA: Diagnosis not present

## 2018-11-30 NOTE — Progress Notes (Signed)
Patient ID: Joel Webb, male   DOB: 05/23/1946, 73 y.o.   MRN: [id number]           Reason for Appointment: Follow-up for Type 2 Diabetes  Referring physician: Moreen Fowler  History of Present Illness:          Date of diagnosis of type 2 diabetes mellitus: 2007       Background history:   He is unclear when he was diagnosed to have diabetes but was not very symptomatic He had been on metformin alone for several years with reportedly good control Also had previously been very consistent with exercise and diet His blood sugars probably started going up 3-4 years ago and he was given Lantus insulin in addition He was seen in consultation in 4/17 with an A1c of 8.4 and he was started Trulicity in addition to his Lantus and metformin This was stopped in 7/17 because of high out-of-pocket expense  The V-go pump was stopped because responded to uncomfortable and not sticking well  Recent history:   INSULIN regimen is: [place] 44 units in p.m., Novolin R 8 units at supper  Non-insulin hypoglycemic drugs the patient is taking are: metformin 1 g twice a day, Trulicity 0.86 mg weekly  His A1c is 6.1, previously 6.9 as of 8/19   Current blood sugar patterns and problems identified:   He did not bring his monitor for download  No change was made on his insulin dose on the last visit  Although he has not been exercising as regularly the last 2 to 4 weeks he has maintained his weight  He thinks with his wife self he has been cutting back on portions and eating healthier meals overall  Blood sugars previously had been up to about 194 but he does not think there more than 150 usually  Lab glucose was 85 last month  His fasting reading today was apparently 75 without any symptoms  He does seem to be regular with taking his mealtime insulin in the evening before eating  No side effects with Trulicity  Side effects from medications have been: None  Compliance with the medical  regimen: fair   Hypoglycemia: none    Glucose monitoring:  done less than 1 times a day         Glucometer:  Accu-Chek Blood Glucose readings by recall:   Range fasting is 75-115 After meals usually under 150   Self-care:  Usually tries to limit High-fat foods and portions                 Dietician visit, most recent:6/17               Exercise: Walking at Gym 1-3/7 days  Weight history:  Wt Readings from Last 3 Encounters:  12/01/18 189 lb 9.6 oz (86 kg)  07/31/18 189 lb 3.2 oz (85.8 kg)  04/06/18 183 lb 3.2 oz (83.1 kg)    Glycemic control:   Lab Results  Component Value Date   HGBA1C 6.1 (A) 12/01/2018   HGBA1C 6.9 (H) 07/06/2018   HGBA1C 6.2 04/03/2018   Lab Results  Component Value Date   MICROALBUR 0.9 10/02/2017   LDLCALC 97 02/02/2016   CREATININE 1.48 07/06/2018   Lab Results  Component Value Date   MICRALBCREAT 1.0 10/02/2017    Lab Results  Component Value Date   FRUCTOSAMINE 266 06/02/2017   FRUCTOSAMINE 233 02/04/2017   FRUCTOSAMINE 229 06/25/2016     Other problems addressed today: See  review of systems   Office Visit on 12/01/2018  Component Date Value Ref Range Status  . Hemoglobin A1C 12/01/2018 6.1* 4.0 - 5.6 % Final      Allergies as of 12/01/2018   No Known Allergies     Medication List       Accurate as of December 01, 2018  8:50 AM. Always use your most recent med list.        amLODipine 5 MG tablet Commonly known as:  NORVASC   atorvastatin 80 MG tablet Commonly known as:  LIPITOR Take 80 mg by mouth daily.   BD INSULIN SYRINGE U/F 31G X 5/16" 1 ML Misc Generic drug:  Insulin Syringe-Needle U-100 USE TO GIVE 2 INSULIN INJECTIONS DAILY   Dulaglutide 0.75 MG/0.5ML Sopn Commonly known as:  TRULICITY INJECT 8.41 MG UNDER THE SKIN ONCE WEEKLY AS DIRECTED   finasteride 5 MG tablet Commonly known as:  PROSCAR   glucose blood test strip Commonly known as:  ACCU-CHEK SMARTVIEW Use to check blood sugar 1 time per  day.   Insulin Degludec 200 UNIT/ML Sopn Commonly known as:  TRESIBA FLEXTOUCH Inject 50 Units into the skin daily.   insulin regular 100 units/mL injection Commonly known as:  NOVOLIN R RELION Inject 8 units at supper   losartan-hydrochlorothiazide 100-12.5 MG tablet Commonly known as:  HYZAAR Take 1 tablet by mouth daily.   metFORMIN 1000 MG tablet Commonly known as:  GLUCOPHAGE TAKE ONE TABLET BY MOUTH TWICE A DAY WITH A MEAL   multivitamin with minerals Tabs tablet Take 1 tablet by mouth daily.   omeprazole 20 MG capsule Commonly known as:  PRILOSEC Take 20 mg by mouth every other day. PRN   Pen Needles 31G X 6 MM Misc Use to inject insulin daily.   tamsulosin 0.4 MG Caps capsule Commonly known as:  FLOMAX Take 0.4 mg by mouth. Reported on 05/29/2016       Allergies: No Known Allergies  Past Medical History:  Diagnosis Date  . Diabetes mellitus without complication ([place])   . ED (erectile dysfunction)   . Hyperlipidemia   . Hypertension   . Plantar fasciitis   . Prostatitis     Past Surgical History:  Procedure Laterality Date  . CIRCUMCISION    . COLONOSCOPY      Family History  Problem Relation Age of Onset  . Hepatitis C Mother   . Mesothelioma Father   . Diabetes Father   . Heart disease Father     Social History:  reports that he has quit smoking. He has never used smokeless tobacco. He reports current alcohol use. He reports that he does not use drugs.    Review of Systems     RENAL dysfunction: Creatinine is stable with reducing his losartan dose Most recent creatinine was 1.43   Lab Results  Component Value Date   CREATININE 1.48 07/06/2018   BUN 34 (H) 07/06/2018   NA 140 07/06/2018   K 4.4 07/06/2018   CL 105 07/06/2018   CO2 27 07/06/2018    Lipid history: Has been on Lipitor, has triglyceride level of 141 with PCP in 5/18 with LDL 93  Labs show LDL 81 12/19 done by PCP    Lab Results  Component Value Date   CHOL  129 12/24/2016   HDL 23.70 (L) 12/24/2016   LDLCALC 97 02/02/2016   LDLDIRECT 118.0 04/03/2018   TRIG 210.0 (H) 12/24/2016   CHOLHDL 5 12/24/2016  Hypertension:Long-standing, treated with Hyzaar, amlodipine 5 mg daily, recently followed by PCP   He usually has good readings at home, systolic 161-096/04-54 but higher in the doctor's office  BP Readings from Last 3 Encounters:  12/01/18 138/84  07/31/18 116/69  04/06/18 112/70     Most recent eye exam was in 9/19, report not available  Most recent foot exam: 9/19  Review of Systems    Physical Examination:  BP 138/84 (BP Location: Left Arm, Patient Position: Sitting, Cuff Size: Normal)   Pulse 70   Ht 5\' 9"  (1.753 m)   Wt 189 lb 9.6 oz (86 kg)   SpO2 97%   BMI 28.00 kg/m      ASSESSMENT:  Diabetes type 2, BMI 28  See history of present illness for  discussion of current diabetes management, blood sugar patterns and problems identified  His A1c is 6.1 and improved from 6.9  This is without any hypoglycemia He thinks his blood sugars are not high after meals Overall he feels that he is doing better on diet although weight is about the same He is usually trying to exercise although not as much this month  Overall doing well with low-dose Trulicity, basal insulin and bolus with the main meal in the evening  HYPERLIPIDEMIA: LDL back under 100, followed by PCP   HYPERTENSION: His blood pressure is higher in the doctor's office but better at home apparently  Mild CKD: Stable  PLAN:     To check urine microalbumin today  He will try to get back into exercise regularly  Since he may be getting further improvement in his blood sugars he will reduce his Tresiba at least 242  Bring monitor on the next visit  Follow-up in 4 months  Reminded him to check readings after meals also  Patient Instructions  Need eye exam report  Check blood sugars on waking up 2 days a week  Also check blood  sugars about 2 hours after meals and do this after different meals by rotation  Recommended blood sugar levels on waking up are 80-130 and about 2 hours after meal is 130-160  Please bring your blood sugar monitor to each visit, thank you  Tresiba 42 units daily       Elayne Snare 12/01/2018, 8:50 AM   Note: This office note was prepared with Dragon voice recognition system technology. Any transcriptional errors that result from this process are unintentional.

## 2018-12-01 ENCOUNTER — Encounter: Payer: Self-pay | Admitting: Endocrinology

## 2018-12-01 ENCOUNTER — Ambulatory Visit: Payer: Medicare HMO | Admitting: Endocrinology

## 2018-12-01 VITALS — BP 138/84 | HR 70 | Ht 69.0 in | Wt 189.6 lb

## 2018-12-01 DIAGNOSIS — E1165 Type 2 diabetes mellitus with hyperglycemia: Secondary | ICD-10-CM

## 2018-12-01 DIAGNOSIS — Z794 Long term (current) use of insulin: Secondary | ICD-10-CM

## 2018-12-01 LAB — POCT GLYCOSYLATED HEMOGLOBIN (HGB A1C): HEMOGLOBIN A1C: 6.1 % — AB (ref 4.0–5.6)

## 2018-12-01 NOTE — Patient Instructions (Addendum)
Need eye exam report  Check blood sugars on waking up 2 days a week  Also check blood sugars about 2 hours after meals and do this after different meals by rotation  Recommended blood sugar levels on waking up are 80-130 and about 2 hours after meal is 130-160  Please bring your blood sugar monitor to each visit, thank you  Tresiba 42 units daily

## 2018-12-02 LAB — MICROALBUMIN / CREATININE URINE RATIO
CREATININE, U: 58 mg/dL
MICROALB/CREAT RATIO: 26.6 mg/g (ref 0.0–30.0)
Microalb, Ur: 15.4 mg/dL — ABNORMAL HIGH (ref 0.0–1.9)

## 2018-12-08 DIAGNOSIS — Z86010 Personal history of colonic polyps: Secondary | ICD-10-CM | POA: Diagnosis not present

## 2018-12-08 DIAGNOSIS — K640 First degree hemorrhoids: Secondary | ICD-10-CM | POA: Diagnosis not present

## 2018-12-08 DIAGNOSIS — K5730 Diverticulosis of large intestine without perforation or abscess without bleeding: Secondary | ICD-10-CM | POA: Diagnosis not present

## 2018-12-21 ENCOUNTER — Other Ambulatory Visit: Payer: Self-pay

## 2018-12-21 MED ORDER — INSULIN DEGLUDEC 200 UNIT/ML ~~LOC~~ SOPN: 44.0000 [IU] | PEN_INJECTOR | Freq: Every day | SUBCUTANEOUS | 3 refills | Status: DC

## 2018-12-21 MED ORDER — DULAGLUTIDE 0.75 MG/0.5ML ~~LOC~~ SOAJ: SUBCUTANEOUS | 3 refills | Status: DC

## 2019-01-06 ENCOUNTER — Other Ambulatory Visit: Payer: Self-pay

## 2019-01-06 MED ORDER — METFORMIN HCL 1000 MG PO TABS: ORAL_TABLET | ORAL | 1 refills | Status: DC

## 2019-02-10 DIAGNOSIS — L570 Actinic keratosis: Secondary | ICD-10-CM | POA: Diagnosis not present

## 2019-02-10 DIAGNOSIS — L814 Other melanin hyperpigmentation: Secondary | ICD-10-CM | POA: Diagnosis not present

## 2019-02-10 DIAGNOSIS — D0461 Carcinoma in situ of skin of right upper limb, including shoulder: Secondary | ICD-10-CM | POA: Diagnosis not present

## 2019-02-10 DIAGNOSIS — L821 Other seborrheic keratosis: Secondary | ICD-10-CM | POA: Diagnosis not present

## 2019-02-10 DIAGNOSIS — D0462 Carcinoma in situ of skin of left upper limb, including shoulder: Secondary | ICD-10-CM | POA: Diagnosis not present

## 2019-03-24 ENCOUNTER — Ambulatory Visit (INDEPENDENT_AMBULATORY_CARE_PROVIDER_SITE_OTHER): Payer: Medicare HMO | Admitting: Psychology

## 2019-03-24 DIAGNOSIS — F4323 Adjustment disorder with mixed anxiety and depressed mood: Secondary | ICD-10-CM | POA: Diagnosis not present

## 2019-03-25 ENCOUNTER — Other Ambulatory Visit: Payer: Self-pay | Admitting: Endocrinology

## 2019-03-29 ENCOUNTER — Other Ambulatory Visit (INDEPENDENT_AMBULATORY_CARE_PROVIDER_SITE_OTHER): Payer: Medicare HMO

## 2019-03-29 ENCOUNTER — Other Ambulatory Visit: Payer: Self-pay

## 2019-03-29 DIAGNOSIS — E1165 Type 2 diabetes mellitus with hyperglycemia: Secondary | ICD-10-CM | POA: Diagnosis not present

## 2019-03-29 DIAGNOSIS — Z794 Long term (current) use of insulin: Secondary | ICD-10-CM | POA: Diagnosis not present

## 2019-03-29 LAB — COMPREHENSIVE METABOLIC PANEL
ALT: 34 U/L (ref 0–53)
AST: 26 U/L (ref 0–37)
Albumin: 4.1 g/dL (ref 3.5–5.2)
Alkaline Phosphatase: 99 U/L (ref 39–117)
BUN: 25 mg/dL — ABNORMAL HIGH (ref 6–23)
CO2: 27 mEq/L (ref 19–32)
Calcium: 9.2 mg/dL (ref 8.4–10.5)
Chloride: 104 mEq/L (ref 96–112)
Creatinine, Ser: 1.21 mg/dL (ref 0.40–1.50)
GFR: 58.87 mL/min — ABNORMAL LOW (ref >60.00)
Glucose, Bld: 128 mg/dL — ABNORMAL HIGH (ref 70–99)
Potassium: 4.2 mEq/L (ref 3.5–5.1)
Sodium: 139 mEq/L (ref 135–145)
Total Bilirubin: 0.5 mg/dL (ref 0.2–1.2)
Total Protein: 6.7 g/dL (ref 6.0–8.3)

## 2019-03-29 LAB — HEMOGLOBIN A1C: Hgb A1c MFr Bld: 6.9 % — ABNORMAL HIGH (ref 4.6–6.5)

## 2019-03-31 ENCOUNTER — Other Ambulatory Visit: Payer: Self-pay

## 2019-04-01 ENCOUNTER — Ambulatory Visit: Payer: Medicare HMO | Admitting: Endocrinology

## 2019-04-01 ENCOUNTER — Encounter: Payer: Self-pay | Admitting: Endocrinology

## 2019-04-01 ENCOUNTER — Other Ambulatory Visit: Payer: Self-pay

## 2019-04-01 VITALS — BP 144/80 | HR 64 | Ht 69.0 in | Wt 189.6 lb

## 2019-04-01 DIAGNOSIS — E1165 Type 2 diabetes mellitus with hyperglycemia: Secondary | ICD-10-CM | POA: Diagnosis not present

## 2019-04-01 DIAGNOSIS — Z794 Long term (current) use of insulin: Secondary | ICD-10-CM

## 2019-04-01 LAB — GLUCOSE, POCT (MANUAL RESULT ENTRY): POC Glucose: 144 mg/dl — AB (ref 70–99)

## 2019-04-01 NOTE — Patient Instructions (Addendum)
Take 4-6 Regular BEFORE LUNCH   Check blood sugars on waking up 3 days a week  Also check blood sugars about 2 hours after meals and do this after different meals by rotation  Recommended blood sugar levels on waking up are 90-130 and about 2 hours after meal is 130-160  Please bring your blood sugar monitor to each visit, thank you

## 2019-04-01 NOTE — Progress Notes (Signed)
Patient ID: Joel Webb, male   DOB: 1946/11/11, 73 y.o.   MRN: [id number]           Reason for Appointment: Follow-up for Type 2 Diabetes  Referring physician: Moreen Fowler  History of Present Illness:          Date of diagnosis of type 2 diabetes mellitus: 2007       Background history:   He is unclear when he was diagnosed to have diabetes but was not very symptomatic He had been on metformin alone for several years with reportedly good control Also had previously been very consistent with exercise and diet His blood sugars probably started going up 3-4 years ago and he was given Lantus insulin in addition He was seen in consultation in 4/17 with an A1c of 8.4 and he was started Trulicity in addition to his Lantus and metformin This was stopped in 7/17 because of high out-of-pocket expense  The V-go pump was stopped because he found it too uncomfortable and not sticking well  Recent history:   INSULIN regimen is: [place] 42 units in p.m., Novolin R 8 units at supper  Non-insulin hypoglycemic drugs the patient is taking are: metformin 1 g twice a day, Trulicity 0.24 mg weekly  His A1c is 6 .9, previously 6.1   Current blood sugar patterns and problems identified:   He again did not bring his monitor for download  He now says that he is eating 3 meals a day, previously was eating mostly a snack at lunch  With this he appears to have higher readings at dinnertime  He says he is checking his blood sugar before meals twice a day  Not clear if he is skipping his evening dose if his blood sugar is normal, did not understand that he will have a higher blood sugar with a meal if not taking insulin  Still complaining about the cost of Trulicity but is continuing this for the benefits  Although he thinks his fasting readings are below 100 his lab glucose was 128 fasting  Today after coffee it is 144  He is trying to do fairly regular walking, probably more than before but  weight has stayed the same  Side effects from medications have been: None Exercise: Walking   Compliance with the medical regimen: fair   Hypoglycemia: none    Glucose monitoring:  done less than 1 times a day         Glucometer:  Accu-Chek Blood Glucose readings by recall:   Range fasting is  <100 acs 140-160 After meals usually under 150   Self-care:  Usually tries to limit High-fat foods and portions                 Dietician visit, most recent:6/17                 Weight history:  Wt Readings from Last 3 Encounters:  04/01/19 189 lb 9.6 oz (86 kg)  12/01/18 189 lb 9.6 oz (86 kg)  07/31/18 189 lb 3.2 oz (85.8 kg)    Glycemic control:   Lab Results  Component Value Date   HGBA1C 6.9 (H) 03/29/2019   HGBA1C 6.1 (A) 12/01/2018   HGBA1C 6.9 (H) 07/06/2018   Lab Results  Component Value Date   MICROALBUR 15.4 (H) 12/01/2018   LDLCALC 97 02/02/2016   CREATININE 1.21 03/29/2019   Lab Results  Component Value Date   MICRALBCREAT 26.6 12/01/2018    Lab Results  Component Value Date   FRUCTOSAMINE 266 06/02/2017   FRUCTOSAMINE 233 02/04/2017   FRUCTOSAMINE 229 06/25/2016     Other problems addressed today: See review of systems   Office Visit on 04/01/2019  Component Date Value Ref Range Status   POC Glucose 04/01/2019 144* 70 - 99 mg/dl Final  Lab on 03/29/2019  Component Date Value Ref Range Status   Sodium 03/29/2019 139  135 - 145 mEq/L Final   Potassium 03/29/2019 4.2  3.5 - 5.1 mEq/L Final   Chloride 03/29/2019 104  96 - 112 mEq/L Final   CO2 03/29/2019 27  19 - 32 mEq/L Final   Glucose, Bld 03/29/2019 128* 70 - 99 mg/dL Final   BUN 03/29/2019 25* 6 - 23 mg/dL Final   Creatinine, Ser 03/29/2019 1.21  0.40 - 1.50 mg/dL Final   Total Bilirubin 03/29/2019 0.5  0.2 - 1.2 mg/dL Final   Alkaline Phosphatase 03/29/2019 99  39 - 117 U/L Final   AST 03/29/2019 26  0 - 37 U/L Final   ALT 03/29/2019 34  0 - 53 U/L Final   Total Protein  03/29/2019 6.7  6.0 - 8.3 g/dL Final   Albumin 03/29/2019 4.1  3.5 - 5.2 g/dL Final   Calcium 03/29/2019 9.2  8.4 - 10.5 mg/dL Final   GFR 03/29/2019 58.87* >60.00 mL/min Final   Hgb A1c MFr Bld 03/29/2019 6.9* 4.6 - 6.5 % Final   Glycemic Control Guidelines for People with Diabetes:Non Diabetic:  <6%Goal of Therapy: <7%Additional Action Suggested:  >8%       Allergies as of 04/01/2019   No Known Allergies     Medication List       Accurate as of Apr 01, 2019  9:53 AM. If you have any questions, ask your nurse or doctor.        amLODipine 5 MG tablet Commonly known as:  NORVASC   atorvastatin 80 MG tablet Commonly known as:  LIPITOR Take 80 mg by mouth daily.   BD Insulin Syringe U/F 31G X 5/16" 1 ML Misc Generic drug:  Insulin Syringe-Needle U-100 USE TO GIVE 2 INSULIN INJECTIONS DAILY   finasteride 5 MG tablet Commonly known as:  PROSCAR   glucose blood test strip Commonly known as:  Accu-Chek SmartView Use to check blood sugar 1 time per day.   Insulin Degludec 200 UNIT/ML Sopn Commonly known as:  [place] FlexTouch Inject 44 Units into the skin daily. What changed:    how much to take  additional instructions   insulin regular 100 units/mL injection Commonly known as:  NovoLIN R ReliOn Inject 8 units at supper What changed:    how much to take  how to take this  when to take this  additional instructions   losartan-hydrochlorothiazide 100-12.5 MG tablet Commonly known as:  HYZAAR Take 1 tablet by mouth daily.   metFORMIN 1000 MG tablet Commonly known as:  GLUCOPHAGE TAKE ONE TABLET BY MOUTH TWICE A DAY WITH A MEAL   multivitamin with minerals Tabs tablet Take 1 tablet by mouth daily.   omeprazole 20 MG capsule Commonly known as:  PRILOSEC Take 20 mg by mouth every other day. PRN   Pen Needles 31G X 6 MM Misc Use to inject insulin daily.   tamsulosin 0.4 MG Caps capsule Commonly known as:  FLOMAX Take 0.4 mg by mouth. Reported on  12/28/4707   Trulicity 6.28 ZM/6.2HU Sopn Generic drug:  Dulaglutide INJECT 0.75 MG UNDER THE SKIN ONCE A WEEK AS DIRECTED  Allergies: No Known Allergies  Past Medical History:  Diagnosis Date   Diabetes mellitus without complication ([facility])    ED (erectile dysfunction)    Hyperlipidemia    Hypertension    Plantar fasciitis    Prostatitis     Past Surgical History:  Procedure Laterality Date   CIRCUMCISION     COLONOSCOPY      Family History  Problem Relation Age of Onset   Hepatitis C Mother    Mesothelioma Father    Diabetes Father    Heart disease Father     Social History:  reports that he has quit smoking. He has never used smokeless tobacco. He reports current alcohol use. He reports that he does not use drugs.    Review of Systems     RENAL dysfunction: Creatinine is stable with lower losartan dose  Lab Results  Component Value Date   CREATININE 1.21 03/29/2019   CREATININE 1.48 07/06/2018   CREATININE 1.65 (H) 04/03/2018    Lipid history: Has been on Lipitor management  Labs show LDL 81 down and in 12/19 by PCP Also tends to have higher triglycerides   Lab Results  Component Value Date   CHOL 129 12/24/2016   HDL 23.70 (L) 12/24/2016   LDLCALC 97 02/02/2016   LDLDIRECT 118.0 04/03/2018   TRIG 210.0 (H) 12/24/2016   CHOLHDL 5 12/24/2016           Hypertension:Long-standing, treated with Hyzaar, amlodipine 5 mg daily, followed by PCP   He usually has good readings at home, systolic 938-101 but higher in the doctor's office  BP Readings from Last 3 Encounters:  04/01/19 (!) 144/80  12/01/18 138/84  07/31/18 116/69     Most recent eye exam was in 9/19, report not available  Most recent foot exam: 9/19  Review of Systems    Physical Examination:  BP (!) 144/80 (BP Location: Left Arm, Patient Position: Sitting, Cuff Size: Normal)    Pulse 64    Ht 5\' 9"  (1.753 m)    Wt 189 lb 9.6 oz (86 kg)    SpO2 99%    BMI 28.00  kg/m    Edema present  ASSESSMENT:  Diabetes type 2, BMI 28  See history of present illness for  discussion of current diabetes management, blood sugar patterns and problems identified  His A1c is back up to 6.9  Although he is trying to do walking for exercise he really has postprandial hyperglycemia Today he was explained the need for controlling postprandial hyperglycemia with insulin doses adequately at both lunch and dinner He is having high readings after lunch since he is starting to eat 3 meals a day now Currently able to maintain his weight Also generally benefiting from Trulicity in addition to his basal bolus insulin  Since he has not done any readings after dinner not clear if he needs to adjust his evening regular insulin However fasting readings are reportedly fairly good and he can continue the same dose of Tresiba HYPERLIPIDEMIA: LDL back under 100, followed by PCP   HYPERTENSION: His blood pressure is higher in the doctor's office but better at home apparently  Mild CKD: Stable  PLAN:     To start bringing blood sugar monitor in the office regularly  Discussed when to check the blood sugars at home and blood sugar targets  To take at least 4 to 6 units of regular insulin before lunch unless eating a low carbohydrate meal  Continue Trulicity as long as  he can afford it  Patient Instructions  Take 4-6 Regular BEFORE LUNCH   Check blood sugars on waking up 3 days a week  Also check blood sugars about 2 hours after meals and do this after different meals by rotation  Recommended blood sugar levels on waking up are 90-130 and about 2 hours after meal is 130-160  Please bring your blood sugar monitor to each visit, thank you        Elayne Snare 04/01/2019, 9:53 AM   Note: This office note was prepared with Dragon voice recognition system technology. Any transcriptional errors that result from this process are unintentional.

## 2019-04-30 DIAGNOSIS — K219 Gastro-esophageal reflux disease without esophagitis: Secondary | ICD-10-CM | POA: Diagnosis not present

## 2019-04-30 DIAGNOSIS — E1122 Type 2 diabetes mellitus with diabetic chronic kidney disease: Secondary | ICD-10-CM | POA: Diagnosis not present

## 2019-04-30 DIAGNOSIS — E782 Mixed hyperlipidemia: Secondary | ICD-10-CM | POA: Diagnosis not present

## 2019-04-30 DIAGNOSIS — D692 Other nonthrombocytopenic purpura: Secondary | ICD-10-CM | POA: Diagnosis not present

## 2019-04-30 DIAGNOSIS — F339 Major depressive disorder, recurrent, unspecified: Secondary | ICD-10-CM | POA: Diagnosis not present

## 2019-04-30 DIAGNOSIS — I10 Essential (primary) hypertension: Secondary | ICD-10-CM | POA: Diagnosis not present

## 2019-04-30 DIAGNOSIS — Z794 Long term (current) use of insulin: Secondary | ICD-10-CM | POA: Diagnosis not present

## 2019-04-30 DIAGNOSIS — N400 Enlarged prostate without lower urinary tract symptoms: Secondary | ICD-10-CM | POA: Diagnosis not present

## 2019-04-30 DIAGNOSIS — N183 Chronic kidney disease, stage 3 (moderate): Secondary | ICD-10-CM | POA: Diagnosis not present

## 2019-05-12 DIAGNOSIS — Z85828 Personal history of other malignant neoplasm of skin: Secondary | ICD-10-CM | POA: Diagnosis not present

## 2019-05-12 DIAGNOSIS — L570 Actinic keratosis: Secondary | ICD-10-CM | POA: Diagnosis not present

## 2019-05-12 DIAGNOSIS — L720 Epidermal cyst: Secondary | ICD-10-CM | POA: Diagnosis not present

## 2019-05-12 DIAGNOSIS — L821 Other seborrheic keratosis: Secondary | ICD-10-CM | POA: Diagnosis not present

## 2019-05-13 DIAGNOSIS — E782 Mixed hyperlipidemia: Secondary | ICD-10-CM | POA: Diagnosis not present

## 2019-05-17 DIAGNOSIS — L723 Sebaceous cyst: Secondary | ICD-10-CM | POA: Diagnosis not present

## 2019-06-15 ENCOUNTER — Other Ambulatory Visit: Payer: Self-pay

## 2019-06-15 MED ORDER — TRULICITY 0.75 MG/0.5ML ~~LOC~~ SOAJ: SUBCUTANEOUS | 2 refills | Status: DC

## 2019-06-15 MED ORDER — INSULIN REGULAR HUMAN 100 UNIT/ML IJ SOLN: 10.0000 [IU] | Freq: Every day | INTRAMUSCULAR | 3 refills | Status: DC

## 2019-06-21 ENCOUNTER — Other Ambulatory Visit: Payer: Self-pay

## 2019-06-21 MED ORDER — ACCU-CHEK SMARTVIEW VI STRP: ORAL_STRIP | 3 refills | Status: DC

## 2019-06-28 ENCOUNTER — Other Ambulatory Visit: Payer: Self-pay

## 2019-06-28 ENCOUNTER — Other Ambulatory Visit (INDEPENDENT_AMBULATORY_CARE_PROVIDER_SITE_OTHER): Payer: Medicare HMO

## 2019-06-28 DIAGNOSIS — E1165 Type 2 diabetes mellitus with hyperglycemia: Secondary | ICD-10-CM

## 2019-06-28 DIAGNOSIS — Z794 Long term (current) use of insulin: Secondary | ICD-10-CM

## 2019-06-28 LAB — COMPREHENSIVE METABOLIC PANEL
ALT: 30 U/L (ref 0–53)
AST: 28 U/L (ref 0–37)
Albumin: 3.8 g/dL (ref 3.5–5.2)
Alkaline Phosphatase: 90 U/L (ref 39–117)
BUN: 23 mg/dL (ref 6–23)
CO2: 25 mEq/L (ref 19–32)
Calcium: 9.5 mg/dL (ref 8.4–10.5)
Chloride: 106 mEq/L (ref 96–112)
Creatinine, Ser: 1.12 mg/dL (ref 0.40–1.50)
GFR: 64.32 mL/min (ref >60.00)
Glucose, Bld: 86 mg/dL (ref 70–99)
Potassium: 4.2 mEq/L (ref 3.5–5.1)
Sodium: 138 mEq/L (ref 135–145)
Total Bilirubin: 0.5 mg/dL (ref 0.2–1.2)
Total Protein: 6.3 g/dL (ref 6.0–8.3)

## 2019-06-28 LAB — HEMOGLOBIN A1C: Hgb A1c MFr Bld: 6.6 % — ABNORMAL HIGH (ref 4.6–6.5)

## 2019-06-30 NOTE — Progress Notes (Signed)
Patient ID: Joel Webb, Joel Webb   DOB: [birth year], 73 y.o.   MRN: [id number]           Reason for Appointment: Follow-up for Type 2 Diabetes  Today's office visit was provided via telemedicine using video technique The patient was explained the limitations of evaluation and management by telemedicine and the availability of in person appointments.  The patient understood the limitations and agreed to proceed. Patient also understood that the telehealth visit is billable. . Location of the patient: Patient's home . Location of the provider: Physician office Only the patient and myself were participating in the encounter    Referring physician: Swayne  History of Present Illness:          Date of diagnosis of type 2 diabetes mellitus: 2007       Background history:   He is unclear when he was diagnosed to have diabetes but was not very symptomatic He had been on metformin alone for several years with reportedly good control Also had previously been very consistent with exercise and diet His blood sugars probably started going up 3-4 years ago and he was given Lantus insulin in addition He was seen in consultation in 4/17 with an A1c of 8.4 and he was started Trulicity in addition to his Lantus and metformin This was stopped in 7/17 because of high out-of-pocket expense  The V-go pump was stopped because he found it too uncomfortable and not sticking well  Recent history:   INSULIN regimen is: Tresiba 42 units in p.m., Novolin R 8 units at supper  Non-insulin hypoglycemic drugs the patient is taking are: metformin 1 g twice a day, Trulicity 4.09 mg weekly  His A1c is 6.6 and improved from 6.9    Current blood sugar patterns and problems identified:   He usually does not bring his monitor but he did this time  Although he admits that his test trips probably have been old and possibly expired he just started with using a new batch of the strips  Despite reminders he  checks blood sugars only in the morning and has only 3 readings in the last month  Has 1 or 2 readings before dinner and only 1 or 2 readings after meals including breakfast and lunch but not dinner  Also he now says that he forgets to take his metformin at dinnertime, usually using a pillbox but is generally able to remember all his morning medications  His weight is about the same reportedly  He is not having hypoglycemic symptoms but his morning sugars have been around 80 a couple of times this week with his trying to take his metformin more regularly  Side effects from medications have been: None Exercise: Walking 15 to 20 minutes almost daily  Compliance with the medical regimen: fair   Hypoglycemia: none    Glucose monitoring:  done less than 1 times a day         Glucometer:  Accu-Chek Blood Glucose readings by recall:    PRE-MEAL Fasting Lunch Dinner Bedtime Overall  Glucose range: 80-114  130    Mean/median:        POST-MEAL PC Breakfast PC Lunch PC Dinner  Glucose range:     Mean/median: 167       Self-care:  Usually tries to limit High-fat foods and portions                 Dietician visit, most recent:6/17  Weight history:  Wt Readings from Last 3 Encounters:  04/01/19 189 lb 9.6 oz (86 kg)  12/01/18 189 lb 9.6 oz (86 kg)  07/31/18 189 lb 3.2 oz (85.8 kg)    Glycemic control:   Lab Results  Component Value Date   HGBA1C 6.6 (H) 06/28/2019   HGBA1C 6.9 (H) 03/29/2019   HGBA1C 6.1 (A) 12/01/2018   Lab Results  Component Value Date   MICROALBUR 15.4 (H) 12/01/2018   LDLCALC 97 02/02/2016   CREATININE 1.12 06/28/2019   Lab Results  Component Value Date   MICRALBCREAT 26.6 12/01/2018    Lab Results  Component Value Date   FRUCTOSAMINE 266 06/02/2017   FRUCTOSAMINE 233 02/04/2017   FRUCTOSAMINE 229 06/25/2016     Other problems addressed today: See review of systems   Lab on 06/28/2019  Component Date Value Ref Range  Status  . Sodium 06/28/2019 138  135 - 145 mEq/L Final  . Potassium 06/28/2019 4.2  3.5 - 5.1 mEq/L Final  . Chloride 06/28/2019 106  96 - 112 mEq/L Final  . CO2 06/28/2019 25  19 - 32 mEq/L Final  . Glucose, Bld 06/28/2019 86  70 - 99 mg/dL Final  . BUN 06/28/2019 23  6 - 23 mg/dL Final  . Creatinine, Ser 06/28/2019 1.12  0.40 - 1.50 mg/dL Final  . Total Bilirubin 06/28/2019 0.5  0.2 - 1.2 mg/dL Final  . Alkaline Phosphatase 06/28/2019 90  39 - 117 U/L Final  . AST 06/28/2019 28  0 - 37 U/L Final  . ALT 06/28/2019 30  0 - 53 U/L Final  . Total Protein 06/28/2019 6.3  6.0 - 8.3 g/dL Final  . Albumin 06/28/2019 3.8  3.5 - 5.2 g/dL Final  . Calcium 06/28/2019 9.5  8.4 - 10.5 mg/dL Final  . GFR 06/28/2019 64.32  >60.00 mL/min Final  . Hgb A1c MFr Bld 06/28/2019 6.6* 4.6 - 6.5 % Final   Glycemic Control Guidelines for People with Diabetes:Non Diabetic:  <6%Goal of Therapy: <7%Additional Action Suggested:  >8%       Allergies as of 07/01/2019   No Known Allergies     Medication List       Accurate as of June 30, 2019  9:42 PM. If you have any questions, ask your nurse or doctor.        Accu-Chek SmartView test strip Generic drug: glucose blood Use to check blood sugar 1 time per day.   amLODipine 5 MG tablet Commonly known as: NORVASC   atorvastatin 80 MG tablet Commonly known as: LIPITOR Take 80 mg by mouth daily.   BD Insulin Syringe U/F 31G X 5/16" 1 ML Misc Generic drug: Insulin Syringe-Needle U-100 USE TO GIVE 2 INSULIN INJECTIONS DAILY   finasteride 5 MG tablet Commonly known as: PROSCAR   Insulin Degludec 200 UNIT/ML Sopn Commonly known as: [place] FlexTouch Inject 44 Units into the skin daily. What changed:   how much to take  additional instructions   insulin regular 100 units/mL injection Commonly known as: NovoLIN R ReliOn Inject 0.1 mLs (10 Units total) into the skin daily. Inject 10 units under the skin once daily at supper    losartan-hydrochlorothiazide 100-12.5 MG tablet Commonly known as: HYZAAR Take 1 tablet by mouth daily.   metFORMIN 1000 MG tablet Commonly known as: GLUCOPHAGE TAKE ONE TABLET BY MOUTH TWICE A DAY WITH A MEAL   multivitamin with minerals Tabs tablet Take 1 tablet by mouth daily.   omeprazole 20 MG capsule  Commonly known as: PRILOSEC Take 20 mg by mouth every other day. PRN   Pen Needles 31G X 6 MM Misc Use to inject insulin daily.   tamsulosin 0.4 MG Caps capsule Commonly known as: FLOMAX Take 0.4 mg by mouth. Reported on 2/44/0102   Trulicity 7.25 DG/6.4QI Sopn Generic drug: Dulaglutide INJECT 0.75 MG UNDER THE SKIN ONCE A WEEK AS DIRECTED       Allergies: No Known Allergies  Past Medical History:  Diagnosis Date  . Diabetes mellitus without complication ([place])   . ED (erectile dysfunction)   . Hyperlipidemia   . Hypertension   . Plantar fasciitis   . Prostatitis     Past Surgical History:  Procedure Laterality Date  . CIRCUMCISION    . COLONOSCOPY      Family History  Problem Relation Age of Onset  . Hepatitis C Mother   . Mesothelioma Father   . Diabetes Father   . Heart disease Father     Social History:  reports that he has quit smoking. He has never used smokeless tobacco. He reports current alcohol use. He reports that he does not use drugs.    Review of Systems     RENAL dysfunction: Creatinine is stable with lower losartan dose  Lab Results  Component Value Date   CREATININE 1.12 06/28/2019   CREATININE 1.21 03/29/2019   CREATININE 1.48 07/06/2018    Lipid history: Has been on Lipitor management  Labs show LDL 81 down and in 12/19 by PCP Also tends to have higher triglycerides   Lab Results  Component Value Date   CHOL 129 12/24/2016   HDL 23.70 (L) 12/24/2016   LDLCALC 97 02/02/2016   LDLDIRECT 118.0 04/03/2018   TRIG 210.0 (H) 12/24/2016   CHOLHDL 5 12/24/2016           Hypertension:Long-standing, treated with Hyzaar,  amlodipine 5 mg daily, followed by PCP   He usually says his blood pressure is controlled although diastolic may be around 34-74, diastolic was 86 with his PCP in June  BP Readings from Last 3 Encounters:  04/01/19 (!) 144/80  12/01/18 138/84  07/31/18 116/69     Most recent eye exam was in 9/19, report not available  Most recent foot exam: 9/19  Review of Systems    Physical Examination:  There were no vitals taken for this visit.     ASSESSMENT:  Diabetes type 2, on insulin  See history of present illness for  discussion of current diabetes management, blood sugar patterns and problems identified  His A1c is slightly better at 6.6  Review of his home monitor indicates that he is checking only occasionally in the mornings and rarely later in the day Discussed with patient that it is difficult to adjust his insulin if he is not monitoring his readings after meals consistently However current regimen especially with adding Trulicity is likely keeping sugars well controlled  Morning sugars are starting to come down with his trying to take his metformin more regularly which he has difficulty remembering at dinnertime No hypoglycemia  Recommendations as below  HYPERLIPIDEMIA: Last LDL was slightly high at 105, defer to PCP   HYPERTENSION: His blood pressure is higher in the doctor's office as before but he is not keeping a record of his home blood pressure readings and needs to do so  Mild CKD: Improving  PLAN:     Reminded him to make sure his test trips are not expired, he has just started  using new test strips  He will start checking blood sugars regularly after supper or bedtime and periodically after lunch also  Reduce Tresiba down to at least 40 and if morning sugars are still in the 80s reduce it to 1:38 week  Make sure he takes metformin twice a day with the second dose can be at lunch  May need to adjust regular insulin at suppertime if his sugars are  higher after dinner  Follow-up in 4 months  Follow-up with PCP regarding blood pressure and lipids  There are no Patient Instructions on file for this visit.     Joel Webb 06/30/2019, 9:42 PM   Note: This office note was prepared with Dragon voice recognition system technology. Any transcriptional errors that result from this process are unintentional.

## 2019-07-01 ENCOUNTER — Encounter: Payer: Self-pay | Admitting: Endocrinology

## 2019-07-01 ENCOUNTER — Ambulatory Visit (INDEPENDENT_AMBULATORY_CARE_PROVIDER_SITE_OTHER): Payer: Medicare HMO | Admitting: Endocrinology

## 2019-07-01 ENCOUNTER — Other Ambulatory Visit: Payer: Self-pay

## 2019-07-01 DIAGNOSIS — E782 Mixed hyperlipidemia: Secondary | ICD-10-CM | POA: Diagnosis not present

## 2019-07-01 DIAGNOSIS — E1165 Type 2 diabetes mellitus with hyperglycemia: Secondary | ICD-10-CM

## 2019-07-01 DIAGNOSIS — Z794 Long term (current) use of insulin: Secondary | ICD-10-CM | POA: Diagnosis not present

## 2019-07-03 ENCOUNTER — Other Ambulatory Visit: Payer: Self-pay | Admitting: Endocrinology

## 2019-07-06 ENCOUNTER — Other Ambulatory Visit: Payer: Self-pay | Admitting: Endocrinology

## 2019-07-06 ENCOUNTER — Other Ambulatory Visit: Payer: Self-pay

## 2019-07-06 MED ORDER — TRESIBA FLEXTOUCH 200 UNIT/ML ~~LOC~~ SOPN: 40.0000 [IU] | PEN_INJECTOR | Freq: Every day | SUBCUTANEOUS | 0 refills | Status: DC

## 2019-08-18 ENCOUNTER — Other Ambulatory Visit: Payer: Self-pay

## 2019-08-18 ENCOUNTER — Other Ambulatory Visit: Payer: Self-pay | Admitting: Endocrinology

## 2019-08-18 DIAGNOSIS — Z20822 Contact with and (suspected) exposure to covid-19: Secondary | ICD-10-CM

## 2019-08-19 LAB — NOVEL CORONAVIRUS, NAA: SARS-CoV-2, NAA: NOT DETECTED

## 2019-08-24 DIAGNOSIS — M79672 Pain in left foot: Secondary | ICD-10-CM | POA: Diagnosis not present

## 2019-08-26 ENCOUNTER — Encounter: Payer: Self-pay | Admitting: Podiatry

## 2019-08-26 ENCOUNTER — Ambulatory Visit: Payer: Medicare HMO | Admitting: Podiatry

## 2019-08-26 ENCOUNTER — Ambulatory Visit (INDEPENDENT_AMBULATORY_CARE_PROVIDER_SITE_OTHER): Payer: Medicare HMO

## 2019-08-26 ENCOUNTER — Other Ambulatory Visit: Payer: Self-pay

## 2019-08-26 VITALS — BP 160/91 | HR 74 | Resp 16

## 2019-08-26 DIAGNOSIS — M7742 Metatarsalgia, left foot: Secondary | ICD-10-CM

## 2019-08-26 DIAGNOSIS — M722 Plantar fascial fibromatosis: Secondary | ICD-10-CM | POA: Diagnosis not present

## 2019-08-26 NOTE — Progress Notes (Signed)
Subjective:  Patient ID: Joel Webb, adult    DOB: 04/21/1946,  MRN: [id number] HPI Chief Complaint  Patient presents with  . Foot Pain    arch left - aching x 1-2 month, no injury, usually a lot of pain after daily walks, no treatment  . Diabetes    Last a1c was "under 7.0"    73 y.o. adult presents with the above complaint.   ROS: Denies fever chills nausea vomiting muscle aches pains calf pain back pain chest pain shortness of breath.  Past Medical History:  Diagnosis Date  . Diabetes mellitus without complication ([place])   . ED (erectile dysfunction)   . Hyperlipidemia   . Hypertension   . Plantar fasciitis   . Prostatitis    Past Surgical History:  Procedure Laterality Date  . CIRCUMCISION    . COLONOSCOPY      Current Outpatient Medications:  .  amLODipine (NORVASC) 5 MG tablet, , Disp: , Rfl:  .  atorvastatin (LIPITOR) 80 MG tablet, Take 80 mg by mouth daily., Disp: , Rfl:  .  BD INSULIN SYRINGE U/F 31G X 5/16" 1 ML MISC, USE TO GIVE 2 INSULIN INJECTIONS DAILY, Disp: 200 each, Rfl: 1 .  Dulaglutide (TRULICITY) [id number] SOPN, INJECT 0.75 MILLIGRAMS UNDER THE SKIN ONCE A WEEK AS DIRECTED BY YOUR DOCTOR, Disp: 4 pen, Rfl: 2 .  finasteride (PROSCAR) 5 MG tablet, , Disp: , Rfl:  .  glucose blood (ACCU-CHEK SMARTVIEW) test strip, Use to check blood sugar 1 time per day., Disp: 50 each, Rfl: 3 .  Insulin Pen Needle (PEN NEEDLES) 31G X 6 MM MISC, Use to inject insulin daily., Disp: 100 each, Rfl: 2 .  insulin regular (NOVOLIN R RELION) 100 units/mL injection, Inject 0.1 mLs (10 Units total) into the skin daily. Inject 10 units under the skin once daily at supper, Disp: 10 mL, Rfl: 3 .  losartan-hydrochlorothiazide (HYZAAR) 100-12.5 MG per tablet, Take 1 tablet by mouth daily., Disp: , Rfl:  .  metFORMIN (GLUCOPHAGE) 1000 MG tablet, TAKE ONE TABLET BY MOUTH TWICE A DAY WITH MEALS, Disp: 180 tablet, Rfl: 0 .  Multiple Vitamin (MULTIVITAMIN WITH MINERALS) TABS tablet,  Take 1 tablet by mouth daily., Disp: , Rfl:  .  omeprazole (PRILOSEC) 20 MG capsule, Take 20 mg by mouth every other day. PRN, Disp: , Rfl:  .  tamsulosin (FLOMAX) 0.4 MG CAPS capsule, Take 0.4 mg by mouth. Reported on 05/29/2016, Disp: , Rfl:  .  TRESIBA FLEXTOUCH 200 UNIT/ML SOPN, INJECT 40 UNITS INTO THE SKIN DAILY, Disp: 9 pen, Rfl: 0  No Known Allergies Review of Systems Objective:   Vitals:   08/26/19 1355  BP: (!) 160/91  Pulse: 74  Resp: 16    General: Well developed, nourished, in no acute distress, alert and oriented x3   Dermatological: Skin is warm, dry and supple bilateral. Nails x 10 are well maintained; remaining integument appears unremarkable at this time. There are no open sores, no preulcerative lesions, no rash or signs of infection present.  Vascular: Dorsalis Pedis artery and Posterior Tibial artery pedal pulses are 2/4 bilateral with immedate capillary fill time. Pedal hair growth present. No varicosities and no lower extremity edema present bilateral.   Neruologic: Grossly intact via light touch bilateral. Vibratory intact via tuning fork bilateral. Protective threshold with Semmes Wienstein monofilament intact to all pedal sites bilateral. Patellar and Achilles deep tendon reflexes 2+ bilateral. No Babinski or clonus noted bilateral.   Musculoskeletal: No gross  boney pedal deformities bilateral. No pain, crepitus, or limitation noted with foot and ankle range of motion bilateral. Muscular strength 5/5 in all groups tested bilateral.  He has pain on palpation of the fifth metatarsal plantar lateral aspect.  Gait: Unassisted, Nonantalgic.    Radiographs:  Radiographs taken today do not demonstrate any osseous abnormalities.  Assessment & Plan:   Assessment: Painful lateral aspect of the right arch.  Metatarsalgia.  Plan: He seems to be doing better since he switched his shoes from the flat Steva  type shoe follow-up with me if he does not completely resolve.      Max T. [place], [place]

## 2019-09-03 ENCOUNTER — Other Ambulatory Visit: Payer: Self-pay

## 2019-09-03 MED ORDER — TRULICITY 0.75 MG/0.5ML ~~LOC~~ SOAJ: SUBCUTANEOUS | 2 refills | Status: DC

## 2019-09-14 DIAGNOSIS — F339 Major depressive disorder, recurrent, unspecified: Secondary | ICD-10-CM | POA: Diagnosis not present

## 2019-09-14 DIAGNOSIS — F329 Major depressive disorder, single episode, unspecified: Secondary | ICD-10-CM | POA: Diagnosis not present

## 2019-09-14 DIAGNOSIS — N400 Enlarged prostate without lower urinary tract symptoms: Secondary | ICD-10-CM | POA: Diagnosis not present

## 2019-09-14 DIAGNOSIS — E1122 Type 2 diabetes mellitus with diabetic chronic kidney disease: Secondary | ICD-10-CM | POA: Diagnosis not present

## 2019-09-14 DIAGNOSIS — E782 Mixed hyperlipidemia: Secondary | ICD-10-CM | POA: Diagnosis not present

## 2019-09-14 DIAGNOSIS — N183 Chronic kidney disease, stage 3 unspecified: Secondary | ICD-10-CM | POA: Diagnosis not present

## 2019-09-14 DIAGNOSIS — N402 Nodular prostate without lower urinary tract symptoms: Secondary | ICD-10-CM | POA: Diagnosis not present

## 2019-09-14 DIAGNOSIS — E119 Type 2 diabetes mellitus without complications: Secondary | ICD-10-CM | POA: Diagnosis not present

## 2019-09-14 DIAGNOSIS — I10 Essential (primary) hypertension: Secondary | ICD-10-CM | POA: Diagnosis not present

## 2019-09-20 ENCOUNTER — Other Ambulatory Visit: Payer: Self-pay

## 2019-09-20 MED ORDER — METFORMIN HCL 1000 MG PO TABS: ORAL_TABLET | ORAL | 0 refills | Status: DC

## 2019-09-20 MED ORDER — TRESIBA FLEXTOUCH 200 UNIT/ML ~~LOC~~ SOPN: 40.0000 [IU] | PEN_INJECTOR | Freq: Every day | SUBCUTANEOUS | 2 refills | Status: DC

## 2019-09-27 ENCOUNTER — Other Ambulatory Visit: Payer: Self-pay

## 2019-09-27 DIAGNOSIS — H2513 Age-related nuclear cataract, bilateral: Secondary | ICD-10-CM | POA: Diagnosis not present

## 2019-09-27 DIAGNOSIS — H52203 Unspecified astigmatism, bilateral: Secondary | ICD-10-CM | POA: Diagnosis not present

## 2019-09-27 DIAGNOSIS — H5203 Hypermetropia, bilateral: Secondary | ICD-10-CM | POA: Diagnosis not present

## 2019-09-27 MED ORDER — "BD INSULIN SYRINGE U/F 31G X 5/16"" 1 ML MISC": 1 refills | Status: DC

## 2019-09-27 MED ORDER — PEN NEEDLES 31G X 6 MM MISC: 2 refills | Status: DC

## 2019-10-01 ENCOUNTER — Other Ambulatory Visit: Payer: Self-pay | Admitting: Endocrinology

## 2019-10-04 ENCOUNTER — Other Ambulatory Visit: Payer: Self-pay

## 2019-10-04 ENCOUNTER — Telehealth: Payer: Self-pay | Admitting: Endocrinology

## 2019-10-04 MED ORDER — TRESIBA FLEXTOUCH 200 UNIT/ML ~~LOC~~ SOPN: 40.0000 [IU] | PEN_INJECTOR | Freq: Every day | SUBCUTANEOUS | 2 refills | Status: DC

## 2019-10-04 MED ORDER — TRESIBA FLEXTOUCH 200 UNIT/ML ~~LOC~~ SOPN: 40.0000 [IU] | PEN_INJECTOR | Freq: Every day | SUBCUTANEOUS | 0 refills | Status: DC

## 2019-10-04 NOTE — Telephone Encounter (Signed)
Patient is currently at the pharmacy and is wanting to fill the [place] for a 90 day instead of a 30 day Is this possible.  Please advise to receiving pharmacy

## 2019-10-04 NOTE — Telephone Encounter (Signed)
This has already been done.

## 2019-10-05 DIAGNOSIS — L723 Sebaceous cyst: Secondary | ICD-10-CM | POA: Diagnosis not present

## 2019-10-26 ENCOUNTER — Other Ambulatory Visit: Payer: Self-pay

## 2019-10-26 ENCOUNTER — Other Ambulatory Visit (INDEPENDENT_AMBULATORY_CARE_PROVIDER_SITE_OTHER): Payer: Medicare HMO

## 2019-10-26 DIAGNOSIS — E1165 Type 2 diabetes mellitus with hyperglycemia: Secondary | ICD-10-CM

## 2019-10-26 DIAGNOSIS — E782 Mixed hyperlipidemia: Secondary | ICD-10-CM | POA: Diagnosis not present

## 2019-10-26 DIAGNOSIS — Z794 Long term (current) use of insulin: Secondary | ICD-10-CM

## 2019-10-26 LAB — HEMOGLOBIN A1C: Hgb A1c MFr Bld: 6.6 % — ABNORMAL HIGH (ref 4.6–6.5)

## 2019-10-26 LAB — BASIC METABOLIC PANEL
BUN: 23 mg/dL (ref 6–23)
CO2: 27 mEq/L (ref 19–32)
Calcium: 9.2 mg/dL (ref 8.4–10.5)
Chloride: 108 mEq/L (ref 96–112)
Creatinine, Ser: 1.18 mg/dL (ref 0.40–1.50)
GFR: 60.51 mL/min (ref >60.00)
Glucose, Bld: 88 mg/dL (ref 70–99)
Potassium: 4.2 mEq/L (ref 3.5–5.1)
Sodium: 141 mEq/L (ref 135–145)

## 2019-10-26 LAB — URINALYSIS, ROUTINE W REFLEX MICROSCOPIC
Bilirubin Urine: NEGATIVE
Hgb urine dipstick: NEGATIVE
Ketones, ur: NEGATIVE
Leukocytes,Ua: NEGATIVE
Nitrite: NEGATIVE
Specific Gravity, Urine: 1.025 (ref 1.000–1.030)
Total Protein, Urine: 100 — AB
Urine Glucose: 100 — AB
Urobilinogen, UA: 0.2 (ref 0.0–1.0)
pH: 5.5 (ref 5.0–8.0)

## 2019-10-26 LAB — MICROALBUMIN / CREATININE URINE RATIO
Creatinine,U: 110.5 mg/dL
Microalb Creat Ratio: 71 mg/g — ABNORMAL HIGH (ref 0.0–30.0)
Microalb, Ur: 78.4 mg/dL — ABNORMAL HIGH (ref 0.0–1.9)

## 2019-10-26 LAB — LDL CHOLESTEROL, DIRECT: Direct LDL: 69 mg/dL

## 2019-10-27 NOTE — Progress Notes (Signed)
Patient ID: Joel Webb, adult   DOB: [birth year], 73 y.o.   MRN: [id number]           Reason for Appointment: Follow-up for Type 2 Diabetes  Today's office visit was provided via telemedicine using video technique The patient was explained the limitations of evaluation and management by telemedicine and the availability of in person appointments.  The patient understood the limitations and agreed to proceed. Patient also understood that the telehealth visit is billable. . Location of the patient: Patient's home . Location of the provider: Physician office Only the patient and myself were participating in the encounter    Referring physician: Swayne  History of Present Illness:          Date of diagnosis of type 2 diabetes mellitus: 2007       Background history:   He is unclear when he was diagnosed to have diabetes but was not very symptomatic He had been on metformin alone for several years with reportedly good control Also had previously been very consistent with exercise and diet His blood sugars probably started going up 3-4 years ago and he was given Lantus insulin in addition He was seen in consultation in 4/17 with an A1c of 8.4 and he was started Trulicity in addition to his Lantus and metformin This was stopped in 7/17 because of high out-of-pocket expense  The V-go pump was stopped because he found it too uncomfortable and not sticking well  Recent history:   INSULIN regimen is: [place] 42 units in p.m., Novolin R 8 units at supper  Non-insulin hypoglycemic drugs the patient is taking are: metformin 1 g twice a day, Trulicity [id number] mg weekly    Current blood sugar patterns and problems identified:   His A1c is stable at 6.6   Although he has checked his blood sugar more often after dinner these appear to be quite variable  He was not aware that he can adjust his mealtime coverage based on what he is eating  He will eat sometimes larger amounts of  carbohydrates and occasionally pizza which will make his sugar go up to well over 200 postprandially  He takes his insulin 20-25 minutes before evening meal and checks his sugar 1-1-1/2 hours after eating usually  Reviewed lower carbohydrate meals his postmeal reading at night is about 120  He is however taking 42 units [place] again instead of 38-40 and he adjusted this only because of his sugars being high at night  With this his fasting sugars have been as low as 79  He thinks his weight is about the same  He is trying to walk a couple of times a day but probably less than what he was doing this summer  Has been consistent with his Trulicity and Metformin  Side effects from medications have been: None Exercise: Walking 15 to 20 minutes at least daily  Compliance with the medical regimen: fair   Hypoglycemia: none    Glucose monitoring:  done less than 1 times a day         Glucometer:  Accu-Chek Blood Glucose readings by recall:    PRE-MEAL Fasting Lunch Dinner Bedtime Overall  Glucose range:  79-150      Mean/median:     ?   POST-MEAL PC Breakfast PC Lunch PC Dinner  Glucose range:    120-235  Mean/median:     Previous readings:   PRE-MEAL Fasting Lunch Dinner Bedtime Overall  Glucose range: 80-114  130  Mean/median:        POST-MEAL PC Breakfast PC Lunch PC Dinner  Glucose range:     Mean/median: 167       Self-care:  Usually tries to limit High-fat foods and portions                 Dietician visit, most recent:6/17                Weight history:  Wt Readings from Last 3 Encounters:  04/01/19 189 lb 9.6 oz (86 kg)  12/01/18 189 lb 9.6 oz (86 kg)  07/31/18 189 lb 3.2 oz (85.8 kg)    Glycemic control:   Lab Results  Component Value Date   HGBA1C 6.6 (H) 10/26/2019   HGBA1C 6.6 (H) 06/28/2019   HGBA1C 6.9 (H) 03/29/2019   Lab Results  Component Value Date   MICROALBUR 78.4 (H) 10/26/2019   LDLCALC 97 02/02/2016   CREATININE 1.18 10/26/2019    Lab Results  Component Value Date   MICRALBCREAT 71.0 (H) 10/26/2019    Lab Results  Component Value Date   FRUCTOSAMINE 266 06/02/2017   FRUCTOSAMINE 233 02/04/2017   FRUCTOSAMINE 229 06/25/2016     Other problems addressed today: See review of systems   Lab on 10/26/2019  Component Date Value Ref Range Status  . Color, Urine 10/26/2019 YELLOW  Yellow;Lt. Yellow;Straw;Dark Yellow;Amber;Green;Red;Brown Final  . APPearance 10/26/2019 CLEAR  Clear;Turbid;Slightly Cloudy;Cloudy Final  . Specific Gravity, Urine 10/26/2019 1.025  1.000 - 1.030 Final  . pH 10/26/2019 5.5  5.0 - 8.0 Final  . Total Protein, Urine 10/26/2019 100* Negative Final  . Urine Glucose 10/26/2019 100* Negative Final  . Ketones, ur 10/26/2019 NEGATIVE  Negative Final  . Bilirubin Urine 10/26/2019 NEGATIVE  Negative Final  . Hgb urine dipstick 10/26/2019 NEGATIVE  Negative Final  . Urobilinogen, UA 10/26/2019 0.2  0.0 - 1.0 Final  . Leukocytes,Ua 10/26/2019 NEGATIVE  Negative Final  . Nitrite 10/26/2019 NEGATIVE  Negative Final  . WBC, UA 10/26/2019 11-20/hpf* 0-2/hpf Final  . RBC / HPF 10/26/2019 0-2/hpf  0-2/hpf Final  . Squamous Epithelial / LPF 10/26/2019 Rare(0-4/hpf)  Rare(0-4/hpf) Final  . Microalb, Ur 10/26/2019 78.4* 0.0 - 1.9 mg/dL Final  . Creatinine,U 10/26/2019 110.5  mg/dL Final  . Microalb Creat Ratio 10/26/2019 71.0* 0.0 - 30.0 mg/g Final  . Direct LDL 10/26/2019 69.0  mg/dL Final   Optimal:  <100 mg/dLNear or Above Optimal:  100-129 mg/dLBorderline High:  130-159 mg/dLHigh:  160-189 mg/dLVery High:  >190 mg/dL  . Sodium 10/26/2019 141  135 - 145 mEq/L Final  . Potassium 10/26/2019 4.2  3.5 - 5.1 mEq/L Final  . Chloride 10/26/2019 108  96 - 112 mEq/L Final  . CO2 10/26/2019 27  19 - 32 mEq/L Final  . Glucose, Bld 10/26/2019 88  70 - 99 mg/dL Final  . BUN 10/26/2019 23  6 - 23 mg/dL Final  . Creatinine, Ser 10/26/2019 1.18  0.40 - 1.50 mg/dL Final  . GFR 10/26/2019 60.51  >60.00 mL/min  Final  . Calcium 10/26/2019 9.2  8.4 - 10.5 mg/dL Final  . Hgb A1c MFr Bld 10/26/2019 6.6* 4.6 - 6.5 % Final   Glycemic Control Guidelines for People with Diabetes:Non Diabetic:  <6%Goal of Therapy: <7%Additional Action Suggested:  >8%       Allergies as of 10/28/2019   No Known Allergies     Medication List       Accurate as of October 27, 2019  2:56 PM.  If you have any questions, ask your nurse or doctor.        Accu-Chek SmartView test strip Generic drug: glucose blood Use to check blood sugar 1 time per day.   amLODipine 5 MG tablet Commonly known as: NORVASC   atorvastatin 80 MG tablet Commonly known as: LIPITOR Take 80 mg by mouth daily.   BD Insulin Syringe U/F 31G X 5/16" 1 ML Misc Generic drug: Insulin Syringe-Needle U-100 USE TO GIVE 2 INSULIN INJECTIONS DAILY   finasteride 5 MG tablet Commonly known as: PROSCAR   insulin regular 100 units/mL injection Commonly known as: NovoLIN R ReliOn Inject 0.1 mLs (10 Units total) into the skin daily. Inject 10 units under the skin once daily at supper   losartan-hydrochlorothiazide 100-12.5 MG tablet Commonly known as: HYZAAR Take 1 tablet by mouth daily.   metFORMIN 1000 MG tablet Commonly known as: GLUCOPHAGE TAKE ONE TABLET BY MOUTH TWICE A DAY WITH MEALS   multivitamin with minerals Tabs tablet Take 1 tablet by mouth daily.   omeprazole 20 MG capsule Commonly known as: PRILOSEC Take 20 mg by mouth every other day. PRN   Pen Needles 31G X 6 MM Misc Use to inject insulin daily.   tamsulosin 0.4 MG Caps capsule Commonly known as: FLOMAX Take 0.4 mg by mouth. Reported on 05/29/2016   Tyler Aas FlexTouch 200 UNIT/ML Sopn Generic drug: Insulin Degludec Inject 40 Units into the skin daily.   Trulicity A999333 0000000 Sopn Generic drug: Dulaglutide INJECT 0.75 MILLIGRAMS UNDER THE SKIN ONCE A WEEK AS DIRECTED BY YOUR DOCTOR       Allergies: No Known Allergies  Past Medical History:  Diagnosis Date   . Diabetes mellitus without complication ([place])   . ED (erectile dysfunction)   . Hyperlipidemia   . Hypertension   . Plantar fasciitis   . Prostatitis     Past Surgical History:  Procedure Laterality Date  . CIRCUMCISION    . COLONOSCOPY      Family History  Problem Relation Age of Onset  . Hepatitis C Mother   . Mesothelioma Father   . Diabetes Father   . Heart disease Father     Social History:  reports that he has quit smoking. He has never used smokeless tobacco. He reports current alcohol use. He reports that he does not use drugs.    Review of Systems     RENAL dysfunction: Creatinine is stable, previously higher  Lab Results  Component Value Date   CREATININE 1.18 10/26/2019   CREATININE 1.12 06/28/2019   CREATININE 1.21 03/29/2019    Lipid history: Has been on Lipitor management  Labs show LDL 105 in June, followed by PCP  Also tends to have higher triglycerides   Lab Results  Component Value Date   CHOL 129 12/24/2016   HDL 23.70 (L) 12/24/2016   LDLCALC 97 02/02/2016   LDLDIRECT 69.0 10/26/2019   TRIG 210.0 (H) 12/24/2016   CHOLHDL 5 12/24/2016           Hypertension:Long-standing, treated with Hyzaar, amlodipine 5 mg daily, followed by PCP   He usually says his blood pressure is controlled, recent reading range about 125/82-84  He is due for follow-up with PCP  BP Readings from Last 3 Encounters:  08/26/19 (!) 160/91  04/01/19 (!) 144/80  12/01/18 138/84     Most recent eye exam was in 9/19, report not available  Most recent foot exam: 9/19  Review of Systems    Physical Examination:  There were no vitals taken for this visit.     ASSESSMENT:  Diabetes type 2, on insulin  See history of present illness for  discussion of current diabetes management, blood sugar patterns and problems identified  His A1c is unchanged at 6.6  His blood sugars are generally controlled but over 200 after dinner if he is eating more  carbohydrate He has checked his sugars after dinner better on this visit Currently not understanding the differences between basal and bolus insulin and how to adjust them This was reviewed today  No hypoglycemia including overnight recently even though he has gone up on the dose of Tresiba again, lowest blood sugar recently 79 at home  Recommendations as below  HYPERLIPIDEMIA: Last LDL was slightly high at 105, defer management to PCP   HYPERTENSION: His blood pressure is followed by PCP and he will be going soon  Last microalbumin normal in 1/20 but higher now, will forward to PCP as he likely needs more aggressive blood pressure management  PLAN:     He can adjust his suppertime regular between 8-12 units with higher doses when he is eating larger amounts of carbohydrate and as much as 12 units for pizza  Continue checking readings after meals more consistently  Reduce Tresiba down to at least 40 and if morning sugars are still low normal go down to 38  Try to walk as much as possible  Follow-up in 4 months again  There are no Patient Instructions on file for this visit.  This note is not being shared with the patient for the following reason: To respect privacy (The patient or proxy has requested that the information not be shared).     Elayne Snare 10/27/2019, 2:56 PM   Note: This office note was prepared with Dragon voice recognition system technology. Any transcriptional errors that result from this process are unintentional.

## 2019-10-28 ENCOUNTER — Ambulatory Visit (INDEPENDENT_AMBULATORY_CARE_PROVIDER_SITE_OTHER): Payer: Medicare HMO | Admitting: Endocrinology

## 2019-10-28 ENCOUNTER — Encounter: Payer: Self-pay | Admitting: Endocrinology

## 2019-10-28 ENCOUNTER — Other Ambulatory Visit: Payer: Self-pay

## 2019-10-28 DIAGNOSIS — E782 Mixed hyperlipidemia: Secondary | ICD-10-CM | POA: Diagnosis not present

## 2019-10-28 DIAGNOSIS — E1165 Type 2 diabetes mellitus with hyperglycemia: Secondary | ICD-10-CM | POA: Diagnosis not present

## 2019-10-28 DIAGNOSIS — Z794 Long term (current) use of insulin: Secondary | ICD-10-CM | POA: Diagnosis not present

## 2019-11-02 DIAGNOSIS — R2689 Other abnormalities of gait and mobility: Secondary | ICD-10-CM | POA: Diagnosis not present

## 2019-11-02 DIAGNOSIS — N183 Chronic kidney disease, stage 3 unspecified: Secondary | ICD-10-CM | POA: Diagnosis not present

## 2019-11-02 DIAGNOSIS — E1122 Type 2 diabetes mellitus with diabetic chronic kidney disease: Secondary | ICD-10-CM | POA: Diagnosis not present

## 2019-11-02 DIAGNOSIS — N400 Enlarged prostate without lower urinary tract symptoms: Secondary | ICD-10-CM | POA: Diagnosis not present

## 2019-11-02 DIAGNOSIS — E782 Mixed hyperlipidemia: Secondary | ICD-10-CM | POA: Diagnosis not present

## 2019-11-02 DIAGNOSIS — I10 Essential (primary) hypertension: Secondary | ICD-10-CM | POA: Diagnosis not present

## 2019-11-02 DIAGNOSIS — Z1389 Encounter for screening for other disorder: Secondary | ICD-10-CM | POA: Diagnosis not present

## 2019-11-02 DIAGNOSIS — F339 Major depressive disorder, recurrent, unspecified: Secondary | ICD-10-CM | POA: Diagnosis not present

## 2019-11-02 DIAGNOSIS — D692 Other nonthrombocytopenic purpura: Secondary | ICD-10-CM | POA: Diagnosis not present

## 2019-11-02 DIAGNOSIS — K219 Gastro-esophageal reflux disease without esophagitis: Secondary | ICD-10-CM | POA: Diagnosis not present

## 2019-11-02 DIAGNOSIS — Z794 Long term (current) use of insulin: Secondary | ICD-10-CM | POA: Diagnosis not present

## 2019-11-02 DIAGNOSIS — Z0000 Encounter for general adult medical examination without abnormal findings: Secondary | ICD-10-CM | POA: Diagnosis not present

## 2019-11-15 DIAGNOSIS — L853 Xerosis cutis: Secondary | ICD-10-CM | POA: Diagnosis not present

## 2019-11-15 DIAGNOSIS — D225 Melanocytic nevi of trunk: Secondary | ICD-10-CM | POA: Diagnosis not present

## 2019-11-15 DIAGNOSIS — L570 Actinic keratosis: Secondary | ICD-10-CM | POA: Diagnosis not present

## 2019-11-15 DIAGNOSIS — D0461 Carcinoma in situ of skin of right upper limb, including shoulder: Secondary | ICD-10-CM | POA: Diagnosis not present

## 2019-11-15 DIAGNOSIS — I788 Other diseases of capillaries: Secondary | ICD-10-CM | POA: Diagnosis not present

## 2019-11-15 DIAGNOSIS — L821 Other seborrheic keratosis: Secondary | ICD-10-CM | POA: Diagnosis not present

## 2019-11-18 DIAGNOSIS — E782 Mixed hyperlipidemia: Secondary | ICD-10-CM | POA: Diagnosis not present

## 2019-11-21 DIAGNOSIS — Z20822 Contact with and (suspected) exposure to covid-19: Secondary | ICD-10-CM | POA: Diagnosis not present

## 2019-11-22 ENCOUNTER — Ambulatory Visit: Payer: Medicare HMO | Attending: Internal Medicine

## 2019-11-22 DIAGNOSIS — Z20822 Contact with and (suspected) exposure to covid-19: Secondary | ICD-10-CM

## 2019-11-22 DIAGNOSIS — I10 Essential (primary) hypertension: Secondary | ICD-10-CM | POA: Diagnosis not present

## 2019-11-22 DIAGNOSIS — R509 Fever, unspecified: Secondary | ICD-10-CM | POA: Diagnosis not present

## 2019-11-22 DIAGNOSIS — Z20828 Contact with and (suspected) exposure to other viral communicable diseases: Secondary | ICD-10-CM | POA: Diagnosis not present

## 2019-11-22 DIAGNOSIS — E119 Type 2 diabetes mellitus without complications: Secondary | ICD-10-CM | POA: Diagnosis not present

## 2019-11-23 ENCOUNTER — Telehealth: Payer: Self-pay | Admitting: Unknown Physician Specialty

## 2019-11-23 ENCOUNTER — Other Ambulatory Visit: Payer: Self-pay | Admitting: Unknown Physician Specialty

## 2019-11-23 DIAGNOSIS — U071 COVID-19: Secondary | ICD-10-CM

## 2019-11-23 LAB — NOVEL CORONAVIRUS, NAA: SARS-CoV-2, NAA: DETECTED — AB

## 2019-11-23 NOTE — Telephone Encounter (Signed)
  I connected by phone with Joel Webb on 11/23/2019 at 4:36 PM to discuss the potential use of an new treatment for mild to moderate COVID-19 viral infection in non-hospitalized patients.  This patient is a 74 y.o. adult that meets the FDA criteria for Emergency Use Authorization of bamlanivimab or casirivimab\imdevimab.  Has a (+) direct SARS-CoV-2 viral test result  Has mild or moderate COVID-19   Is ? 74 years of age and weighs ? 40 kg  Is NOT hospitalized due to COVID-19  Is NOT requiring oxygen therapy or requiring an increase in baseline oxygen flow rate due to COVID-19  Is within 10 days of symptom onset  Has at least one of the high risk factor(s) for progression to severe COVID-19 and/or hospitalization as defined in EUA.  Specific high risk criteria : Diabetes   I have spoken and communicated the following to the patient or parent/caregiver:  1. FDA has authorized the emergency use of bamlanivimab and casirivimab\imdevimab for the treatment of mild to moderate COVID-19 in adults and pediatric patients with positive results of direct SARS-CoV-2 viral testing who are 44 years of age and older weighing at least 40 kg, and who are at high risk for progressing to severe COVID-19 and/or hospitalization.  2. The significant known and potential risks and benefits of bamlanivimab and casirivimab\imdevimab, and the extent to which such potential risks and benefits are unknown.  3. Information on available alternative treatments and the risks and benefits of those alternatives, including clinical trials.  4. Patients treated with bamlanivimab and casirivimab\imdevimab should continue to self-isolate and use infection control measures (e.g., wear mask, isolate, social distance, avoid sharing personal items, clean and disinfect "high touch" surfaces, and frequent handwashing) according to CDC guidelines.   5. The patient or parent/caregiver has the option to accept or refuse  bamlanivimab or casirivimab\imdevimab .  After reviewing this information with the patient, The patient agreed to proceed with receiving the bamlanimivab infusion and will be provided a copy of the Fact sheet prior to receiving the infusion.[name] 11/23/2019 4:36 PM Sx onset 11/21/2019

## 2019-11-26 ENCOUNTER — Ambulatory Visit (HOSPITAL_COMMUNITY)
Admission: RE | Admit: 2019-11-26 | Discharge: 2019-11-26 | Disposition: A | Discharge status: Home / Self Care | Payer: Medicare Other | Source: Ambulatory Visit | Attending: Pulmonary Disease | Admitting: Pulmonary Disease

## 2019-11-26 DIAGNOSIS — U071 COVID-19: Secondary | ICD-10-CM | POA: Diagnosis present

## 2019-11-26 DIAGNOSIS — Z23 Encounter for immunization: Secondary | ICD-10-CM | POA: Diagnosis not present

## 2019-11-26 MED ORDER — ALBUTEROL SULFATE HFA 108 (90 BASE) MCG/ACT IN AERS: 2.0000 | INHALATION_SPRAY | Freq: Once | RESPIRATORY_TRACT | Status: DC | PRN

## 2019-11-26 MED ORDER — METHYLPREDNISOLONE SODIUM SUCC 125 MG IJ SOLR: 125.0000 mg | Freq: Once | INTRAMUSCULAR | Status: DC | PRN

## 2019-11-26 MED ORDER — FAMOTIDINE IN NACL 20-0.9 MG/50ML-% IV SOLN: 20.0000 mg | Freq: Once | INTRAVENOUS | Status: DC | PRN

## 2019-11-26 MED ORDER — SODIUM CHLORIDE 0.9 % IV SOLN
700.0000 mg | Freq: Once | INTRAVENOUS | Status: AC
  Administered 2019-11-26: 700 mg via INTRAVENOUS
  Filled 2019-11-26: qty 20

## 2019-11-26 MED ORDER — SODIUM CHLORIDE 0.9 % IV SOLN: INTRAVENOUS | Status: DC | PRN

## 2019-11-26 MED ORDER — DIPHENHYDRAMINE HCL 50 MG/ML IJ SOLN: 50.0000 mg | Freq: Once | INTRAMUSCULAR | Status: DC | PRN

## 2019-11-26 MED ORDER — EPINEPHRINE 0.3 MG/0.3ML IJ SOAJ: 0.3000 mg | Freq: Once | INTRAMUSCULAR | Status: DC | PRN

## 2019-11-26 NOTE — Discharge Instructions (Signed)

## 2019-11-26 NOTE — Progress Notes (Signed)
  Diagnosis: COVID-19  Physician: Dr. Joya Gaskins  Procedure: [facility] Med: bamlanivimab infusion - Provided patient with bamlanimivab fact sheet for patients, parents and caregivers prior to infusion.  Complications: No immediate complications noted.  Discharge: Discharged home   Tia Masker 11/26/2019

## 2019-12-09 DIAGNOSIS — E1122 Type 2 diabetes mellitus with diabetic chronic kidney disease: Secondary | ICD-10-CM | POA: Diagnosis not present

## 2019-12-09 DIAGNOSIS — N402 Nodular prostate without lower urinary tract symptoms: Secondary | ICD-10-CM | POA: Diagnosis not present

## 2019-12-09 DIAGNOSIS — E119 Type 2 diabetes mellitus without complications: Secondary | ICD-10-CM | POA: Diagnosis not present

## 2019-12-09 DIAGNOSIS — E782 Mixed hyperlipidemia: Secondary | ICD-10-CM | POA: Diagnosis not present

## 2019-12-09 DIAGNOSIS — F339 Major depressive disorder, recurrent, unspecified: Secondary | ICD-10-CM | POA: Diagnosis not present

## 2019-12-09 DIAGNOSIS — N400 Enlarged prostate without lower urinary tract symptoms: Secondary | ICD-10-CM | POA: Diagnosis not present

## 2019-12-09 DIAGNOSIS — I10 Essential (primary) hypertension: Secondary | ICD-10-CM | POA: Diagnosis not present

## 2019-12-09 DIAGNOSIS — F329 Major depressive disorder, single episode, unspecified: Secondary | ICD-10-CM | POA: Diagnosis not present

## 2019-12-14 ENCOUNTER — Other Ambulatory Visit: Payer: Self-pay | Admitting: Endocrinology

## 2019-12-19 ENCOUNTER — Other Ambulatory Visit: Payer: Self-pay | Admitting: Endocrinology

## 2019-12-21 ENCOUNTER — Encounter: Payer: Self-pay | Admitting: *Deleted

## 2019-12-22 ENCOUNTER — Encounter: Payer: Self-pay | Admitting: Diagnostic Neuroimaging

## 2019-12-22 ENCOUNTER — Ambulatory Visit: Payer: Medicare HMO | Admitting: Diagnostic Neuroimaging

## 2019-12-22 ENCOUNTER — Telehealth: Payer: Self-pay | Admitting: Endocrinology

## 2019-12-22 ENCOUNTER — Other Ambulatory Visit: Payer: Self-pay

## 2019-12-22 VITALS — BP 140/84 | HR 72 | Temp 97.0°F | Ht 69.75 in | Wt 184.0 lb

## 2019-12-22 DIAGNOSIS — M542 Cervicalgia: Secondary | ICD-10-CM | POA: Diagnosis not present

## 2019-12-22 DIAGNOSIS — R292 Abnormal reflex: Secondary | ICD-10-CM | POA: Diagnosis not present

## 2019-12-22 DIAGNOSIS — R269 Unspecified abnormalities of gait and mobility: Secondary | ICD-10-CM

## 2019-12-22 DIAGNOSIS — R413 Other amnesia: Secondary | ICD-10-CM

## 2019-12-22 NOTE — Telephone Encounter (Signed)
Patient requests to be called at ph# [phone]

## 2019-12-22 NOTE — Patient Instructions (Signed)
-   check MRI brain, cervical spine, B12 level

## 2019-12-22 NOTE — Telephone Encounter (Signed)
This has been addressed via pt's mychart message.

## 2019-12-22 NOTE — Progress Notes (Signed)
[facility]  PATIENT: Joel Webb DOB: 02/02/1946  REFERRING CLINICIAN: Antony Contras, MD HISTORY FROM: patient and wife  REASON FOR VISIT: new consult    HISTORICAL  CHIEF COMPLAINT:  Chief Complaint  Patient presents with  . Imbalance    rm 6 New Pt wife- Joel Webb "sense of imbalance, stumbling when walking, tend to go forward    HISTORY OF PRESENT ILLNESS:   74 year old male here for evaluation of gait and balance difficulty.  History of diabetes, hypercholesterolemia, high blood pressure.  For past 6 to 8 months patient has had sensation of stumbling, tripping, numbness in feet, left foot incoordination.  Symptoms worsening over time.  He has more difficulty with steps and uneven surfaces.  He notices problem in his left foot.  Has had some back pain in the past.  Wife also notes at least 1 year of memory loss, personality and mood changes, irritable, paranoid, forgetting anniversary dates, forgetting medications and appointments, leaving stove on.   REVIEW OF SYSTEMS: Full 14 system review of systems performed and negative with exception of: As per HPI.  ALLERGIES: No Known Allergies  HOME MEDICATIONS: Outpatient Medications Prior to Visit  Medication Sig Dispense Refill  . amLODipine (NORVASC) 5 MG tablet     . atorvastatin (LIPITOR) 80 MG tablet Take 80 mg by mouth daily.    . BD INSULIN SYRINGE U/F 31G X 5/16" 1 ML MISC USE TO GIVE 2 INSULIN INJECTIONS DAILY 200 each 1  . Dulaglutide (TRULICITY) A999333 0000000 SOPN INJECT 0.75 MILLIGRAMS UNDER THE SKIN ONCE A WEEK AS DIRECTED BY YOUR DOCTOR 4 pen 2  . finasteride (PROSCAR) 5 MG tablet     . glucose blood (ACCU-CHEK SMARTVIEW) test strip Use to check blood sugar 1 time per day. 50 each 3  . Insulin Pen Needle (PEN NEEDLES) 31G X 6 MM MISC Use to inject insulin daily. 100 each 2  . insulin regular (NOVOLIN R RELION) 100 units/mL injection Inject 0.1 mLs (10 Units total) into the skin daily. Inject  10 units under the skin once daily at supper (Patient taking differently: Inject 8 Units into the skin daily. Inject 8 units under the skin once daily at supper) 10 mL 3  . losartan-hydrochlorothiazide (HYZAAR) 100-12.5 MG per tablet Take 1 tablet by mouth daily.    . metFORMIN (GLUCOPHAGE) 1000 MG tablet TAKE ONE TABLET BY MOUTH TWICE A DAY WITH MEALS 180 tablet 0  . Multiple Vitamin (MULTIVITAMIN WITH MINERALS) TABS tablet Take 1 tablet by mouth daily.    . Omega-3 Fatty Acids (FISH OIL) 1000 MG CAPS Take by mouth.    Marland Kitchen omeprazole (PRILOSEC) 20 MG capsule Take 20 mg by mouth every other day. PRN    . tamsulosin (FLOMAX) 0.4 MG CAPS capsule Take 0.4 mg by mouth. Reported on 05/29/2016    . TRESIBA FLEXTOUCH 200 UNIT/ML SOPN INJECT 40 UNITS UNDER THE SKIN DAILY 15 mL 1   No facility-administered medications prior to visit.    PAST MEDICAL HISTORY: Past Medical History:  Diagnosis Date  . CKD (chronic kidney disease), stage III   . Depression   . Diabetes mellitus without complication ([place])   . ED (erectile dysfunction)   . GERD (gastroesophageal reflux disease)   . Hyperlipidemia   . Hypertension   . Plantar fasciitis   . Prostatitis     PAST SURGICAL HISTORY: Past Surgical History:  Procedure Laterality Date  . CIRCUMCISION    . COLONOSCOPY  FAMILY HISTORY: Family History  Problem Relation Age of Onset  . Hepatitis C Mother   . Mesothelioma Father   . Diabetes Father   . Heart disease Father     SOCIAL HISTORY: Social History   Socioeconomic History  . Marital status: Married    Spouse name: Joel Webb  . Number of children: 1  . Years of education: Not on file  . Highest education level: Bachelor's degree (e.g., BA, AB, BS)  Occupational History    Comment: sales rep, retired  Tobacco Use  . Smoking status: Former Smoker    Quit date: 12/20/2008    Years since quitting: 11.0  . Smokeless tobacco: Never Used  Substance and Sexual Activity  . Alcohol use: Yes     Comment: rarely  . Drug use: No  . Sexual activity: Not on file  Other Topics Concern  . Not on file  Social History Narrative   Lives with wife   Caffeine coffee 1 1/2 c daily   Social Determinants of Health   Financial Resource Strain:   . Difficulty of Paying Living Expenses: Not on file  Food Insecurity:   . Worried About Charity fundraiser in the Last Year: Not on file  . Ran Out of Food in the Last Year: Not on file  Transportation Needs:   . Lack of Transportation (Medical): Not on file  . Lack of Transportation (Non-Medical): Not on file  Physical Activity:   . Days of Exercise per Week: Not on file  . Minutes of Exercise per Session: Not on file  Stress:   . Feeling of Stress : Not on file  Social Connections:   . Frequency of Communication with Friends and Family: Not on file  . Frequency of Social Gatherings with Friends and Family: Not on file  . Attends Religious Services: Not on file  . Active Member of Clubs or Organizations: Not on file  . Attends Archivist Meetings: Not on file  . Marital Status: Not on file  Intimate Partner Violence:   . Fear of Current or Ex-Partner: Not on file  . Emotionally Abused: Not on file  . Physically Abused: Not on file  . Sexually Abused: Not on file     PHYSICAL EXAM  GENERAL EXAM/CONSTITUTIONAL: Vitals:  Vitals:   12/22/19 1013  BP: 140/84  Pulse: 72  Temp: (!) 97 F (36.1 C)  Weight: 184 lb (83.5 kg)  Height: 5' 9.75" (1.772 m)     Body mass index is 26.59 kg/m. Wt Readings from Last 3 Encounters:  12/22/19 184 lb (83.5 kg)  04/01/19 189 lb 9.6 oz (86 kg)  12/01/18 189 lb 9.6 oz (86 kg)     Patient is in no distress; well developed, nourished and groomed; neck is supple  CARDIOVASCULAR:  Examination of carotid arteries is normal; no carotid bruits  Regular rate and rhythm, no murmurs  Examination of peripheral vascular system by observation and palpation is  normal  EYES:  Ophthalmoscopic exam of optic discs and posterior segments is normal; no papilledema or hemorrhages  No exam data present  MUSCULOSKELETAL:  Gait, strength, tone, movements noted in Neurologic exam below  NEUROLOGIC: MENTAL STATUS:  No flowsheet data found.  awake, alert, oriented to person, place and time  recent and remote memory intact  normal attention and concentration  language fluent, comprehension intact, naming intact  fund of knowledge appropriate  CRANIAL NERVE:   2nd - no papilledema on fundoscopic exam  2nd, 3rd, 4th, 6th - pupils equal and reactive to light, visual fields full to confrontation, extraocular muscles intact, no nystagmus  5th - facial sensation symmetric  7th - facial strength symmetric  8th - hearing intact  9th - palate elevates symmetrically, uvula midline  11th - shoulder shrug symmetric  12th - tongue protrusion midline  MOTOR:   normal bulk and tone, full strength in the BUE, BLE  SENSORY:   normal and symmetric to light touch, temperature, vibration  COORDINATION:   finger-nose-finger, fine finger movements SLIGHTLY SLOW IN BUE; DECR IN LEFT FOOT TAPPING SPEED  REFLEXES:   deep tendon reflexes BRISK (3+ IN BUE; 2+ IN BLE) and symmetric; POSITIVE HOFFMANS  GAIT/STATION:   narrow based gait; able to walk on toes, heels; DIFF WITH TANDEM; romberg is negative     DIAGNOSTIC DATA (LABS, IMAGING, TESTING) - I reviewed patient records, labs, notes, testing and imaging myself where available.  Lab Results  Component Value Date   WBC 6.8 02/02/2016      Component Value Date/Time   NA 141 10/26/2019 0809   NA 137 02/02/2016 0000   K 4.2 10/26/2019 0809   CL 108 10/26/2019 0809   CO2 27 10/26/2019 0809   GLUCOSE 88 10/26/2019 0809   BUN 23 10/26/2019 0809   BUN 33 (A) 02/02/2016 0000   CREATININE 1.18 10/26/2019 0809   CALCIUM 9.2 10/26/2019 0809   PROT 6.3 06/28/2019 0749   ALBUMIN 3.8  06/28/2019 0749   AST 28 06/28/2019 0749   ALT 30 06/28/2019 0749   ALKPHOS 90 06/28/2019 0749   BILITOT 0.5 06/28/2019 0749   Lab Results  Component Value Date   CHOL 129 12/24/2016   HDL 23.70 (L) 12/24/2016   LDLCALC 97 02/02/2016   LDLDIRECT 69.0 10/26/2019   TRIG 210.0 (H) 12/24/2016   CHOLHDL 5 12/24/2016   Lab Results  Component Value Date   HGBA1C 6.6 (H) 10/26/2019   No results found for: VITAMINB12 Lab Results  Component Value Date   TSH 1.71 02/02/2016       ASSESSMENT AND PLAN  74 y.o. year old adult here with 1 year of gait and balance difficulty, left foot incoordination, memory loss, personality mood changes.  We will proceed with further work-up  Dx:  1. Gait difficulty   2. Memory loss   3. Hyperreflexia   4. Neck pain       PLAN:  GAIT DIFFICULTY (hyper-reflexia, neck pain) - check MRI brain (eval for stroke, mass, inflamm) - check MRI cervical spine (eval for cervical myelopathy) - consider PT evaluation  MEMORY LOSS / BEHAVIOR / PERSONALITY changes - check MRI brain (eval for stroke, mass) - consider depression treatments per PCP  Orders Placed This Encounter  Procedures  . MR BRAIN W WO CONTRAST  . MR CERVICAL SPINE WO CONTRAST  . Vitamin B12   Return pending test results, for pending if symptoms worsen or fail to improve.    Penni Bombard, MD [id number], [id number] AM Certified in Neurology, Neurophysiology and Neuroimaging  [facility] [address] [place],  [address] [phone]

## 2019-12-23 ENCOUNTER — Other Ambulatory Visit: Payer: Self-pay

## 2019-12-23 ENCOUNTER — Encounter: Payer: Self-pay | Admitting: *Deleted

## 2019-12-23 DIAGNOSIS — R3912 Poor urinary stream: Secondary | ICD-10-CM | POA: Diagnosis not present

## 2019-12-23 DIAGNOSIS — N403 Nodular prostate with lower urinary tract symptoms: Secondary | ICD-10-CM | POA: Diagnosis not present

## 2019-12-23 DIAGNOSIS — N5201 Erectile dysfunction due to arterial insufficiency: Secondary | ICD-10-CM | POA: Diagnosis not present

## 2019-12-23 DIAGNOSIS — R3915 Urgency of urination: Secondary | ICD-10-CM | POA: Diagnosis not present

## 2019-12-23 LAB — VITAMIN B12: Vitamin B-12: 541 pg/mL (ref 232–1245)

## 2019-12-23 MED ORDER — TRULICITY 0.75 MG/0.5ML ~~LOC~~ SOAJ: SUBCUTANEOUS | 2 refills | Status: DC

## 2019-12-27 ENCOUNTER — Telehealth: Payer: Self-pay | Admitting: Diagnostic Neuroimaging

## 2019-12-27 NOTE — Telephone Encounter (Signed)
[employer] pending faxed notes.  

## 2019-12-28 NOTE — Telephone Encounter (Signed)
[name] Kaufmann: [id number] (exp. 12/27/19 to 01/26/20) patient is scheduled at GI for 01/12/20.

## 2020-01-06 ENCOUNTER — Ambulatory Visit: Payer: Self-pay | Admitting: *Deleted

## 2020-01-06 NOTE — Telephone Encounter (Signed)
Summary: covid/vaccine   Patient would like a call back before he gets the 1st vaccine done. He was diagnosed with covid Jan 4th. Patient Would like to make sure he can still get the vaccine. Patient states he got an email stating he was on top of the waitlist.  Call back [phone]       Reason for Disposition . COVID-19 vaccine, Frequently Asked Questions (FAQs)  Answer Assessment - Initial Assessment Questions 1. MAIN CONCERN OR SYMPTOM:  "What is your main concern right now?" "What question do you have?" "What's the main symptom you're worried about?" (e.g., fever, pain, redness, swelling)     + COVID -1/4- can I get vaccine?  Protocols used: CORONAVIRUS (COVID-19) VACCINE QUESTIONS AND REACTIONS-A-AH  [9:34 AM] Valli Glance      Good morning- if a patient has had + COVID and infusion and has had to to wait until 4/4 to get injection- can we make note of that or does he go to bottom of the list- he is up to be scheduled now  ?[9:35 AM] Dianah Field, Jonelle Sidle     he will need to call back closer to the date.  Reason being is that we only open up schedules on a weekly basis. So we don' t have a way to keep up with who needs the vaccine when ?[9:36 AM] Sherlyn Lees- so I will tell him to call the number provided when he is eligible?  (1 liked)  Per Tiffany: patient advised to hold onto the number provided and to call back for scheduling when he is able to get injection.

## 2020-01-11 DIAGNOSIS — L723 Sebaceous cyst: Secondary | ICD-10-CM | POA: Diagnosis not present

## 2020-01-12 ENCOUNTER — Other Ambulatory Visit: Payer: Self-pay

## 2020-01-12 ENCOUNTER — Ambulatory Visit
Admission: RE | Admit: 2020-01-12 | Discharge: 2020-01-12 | Disposition: A | Discharge status: Home / Self Care | Payer: Medicare Other | Source: Ambulatory Visit | Attending: Diagnostic Neuroimaging | Admitting: Diagnostic Neuroimaging

## 2020-01-12 DIAGNOSIS — M542 Cervicalgia: Secondary | ICD-10-CM

## 2020-01-12 DIAGNOSIS — R292 Abnormal reflex: Secondary | ICD-10-CM | POA: Diagnosis not present

## 2020-01-12 DIAGNOSIS — R413 Other amnesia: Secondary | ICD-10-CM | POA: Diagnosis not present

## 2020-01-12 DIAGNOSIS — R269 Unspecified abnormalities of gait and mobility: Secondary | ICD-10-CM

## 2020-01-12 MED ORDER — GADOBENATE DIMEGLUMINE 529 MG/ML IV SOLN
15.0000 mL | Freq: Once | INTRAVENOUS | Status: AC | PRN
  Administered 2020-01-12: 15 mL via INTRAVENOUS

## 2020-01-17 ENCOUNTER — Telehealth: Payer: Self-pay | Admitting: *Deleted

## 2020-01-17 HISTORY — PX: MOUTH SURGERY: SHX715

## 2020-01-17 NOTE — Telephone Encounter (Addendum)
Called wife who stated she's concerned about his multiple personality changes and memory issues. She stated conditions have gotten so bad they've been in counseling, discussed separation; she began to talk to attorney. She works full time, but he is retired. He drives to grocery store, but has left gas stove on, left dog outside in the rain and gone to dr appointments on wrong day. She stated that she feels more is going on and wants to know what other tests, evaluations can be done.   I advised will discuss with Dr Leta Baptist and let her know his recommendations. Wife verbalized understanding, appreciation.

## 2020-01-17 NOTE — Telephone Encounter (Signed)
Spoke with patient and informed him his MRI brain had unremarkable results. MRI cervical spine showed mild degenerative changes. There is mild spinal stenosis, but Dr Leta Baptist is not sure if it's enough to explain his symptoms. He may consider PT evaluation and may need to consider spine surgery consult in future.  Patient stated he will think about PT eval,  verbalized understanding, appreciation.

## 2020-01-17 NOTE — Telephone Encounter (Signed)
First consult was for gait difficulty. Can setup another appt for memory eval. -VRP

## 2020-01-17 NOTE — Telephone Encounter (Signed)
Spoke with [name] and advised her Dr Leta Baptist will be glad to see him for memory evaluation. We scheduled for Wed next week, she will bring in notes she has kept. She understands to arrive 30 minutes early, verbalized understanding, appreciation.

## 2020-01-17 NOTE — Telephone Encounter (Signed)
Pt wife(Joan Kohan)on DPR is asking for a call from RN to discuss the information that was relayed to pt and to discuss concerns she has about pt's memory

## 2020-01-20 ENCOUNTER — Ambulatory Visit: Payer: Medicare HMO

## 2020-01-26 ENCOUNTER — Ambulatory Visit: Payer: Medicare HMO | Admitting: Diagnostic Neuroimaging

## 2020-01-26 ENCOUNTER — Other Ambulatory Visit: Payer: Self-pay

## 2020-01-26 ENCOUNTER — Encounter: Payer: Self-pay | Admitting: Diagnostic Neuroimaging

## 2020-01-26 VITALS — BP 157/95 | HR 73 | Temp 97.7°F | Ht 69.75 in | Wt 186.8 lb

## 2020-01-26 DIAGNOSIS — R413 Other amnesia: Secondary | ICD-10-CM | POA: Diagnosis not present

## 2020-01-26 NOTE — Progress Notes (Signed)
[facility]  PATIENT: Joel Webb DOB: 29-Apr-1946  REFERRING CLINICIAN: Antony Contras, MD HISTORY FROM: patient and Joel  REASON FOR VISIT: follow up   HISTORICAL  CHIEF COMPLAINT:  Chief Complaint  Patient presents with  . Memory Loss    rm 7, Joel Webb  MMSE 28    HISTORY OF PRESENT ILLNESS:   UPDATE (01/26/20, VRP): Since last visit, doing about the same. Symptoms are persistent. Severity is moderate per patient. No alleviating or aggravating factors. Joel still notes memory and personality changes.  PRIOR HPI (12/22/19): 74 year old male here for evaluation of gait and balance difficulty.  History of diabetes, hypercholesterolemia, high blood pressure.  For past 6 to 8 months patient has had sensation of stumbling, tripping, numbness in feet, left foot incoordination.  Symptoms worsening over time.  He has more difficulty with steps and uneven surfaces.  He notices problem in his left foot.  Has had some back pain in the past.  Joel also notes at least 1 year of memory loss, personality and mood changes, irritable, paranoid, forgetting anniversary dates, forgetting medications and appointments, leaving stove on.  Patient and Joel had COVID in Jan 2021.    REVIEW OF SYSTEMS: Full 14 system review of systems performed and negative with exception of: As per HPI.  ALLERGIES: No Known Allergies  HOME MEDICATIONS: Outpatient Medications Prior to Visit  Medication Sig Dispense Refill  . amLODipine (NORVASC) 5 MG tablet     . atorvastatin (LIPITOR) 80 MG tablet Take 80 mg by mouth daily.    . BD INSULIN SYRINGE U/F 31G X 5/16" 1 ML MISC USE TO GIVE 2 INSULIN INJECTIONS DAILY 200 each 1  . Dulaglutide (TRULICITY) [id number] [id number] SOPN INJECT 0.75 MILLIGRAMS UNDER THE SKIN ONCE A WEEK AS DIRECTED BY YOUR DOCTOR 6 mL 2  . finasteride (PROSCAR) 5 MG tablet     . glucose blood (ACCU-CHEK SMARTVIEW) test strip Use to check blood sugar 1 time per day. 50 each 3    . Insulin Pen Needle (PEN NEEDLES) 31G X 6 MM MISC Use to inject insulin daily. 100 each 2  . insulin regular (NOVOLIN R RELION) 100 units/mL injection Inject 0.1 mLs (10 Units total) into the skin daily. Inject 10 units under the skin once daily at supper (Patient taking differently: Inject 8 Units into the skin daily. Inject 8 units under the skin once daily at supper) 10 mL 3  . losartan-hydrochlorothiazide (HYZAAR) 100-12.5 MG per tablet Take 1 tablet by mouth daily.    . metFORMIN (GLUCOPHAGE) 1000 MG tablet TAKE ONE TABLET BY MOUTH TWICE A DAY WITH MEALS 180 tablet 0  . Multiple Vitamin (MULTIVITAMIN WITH MINERALS) TABS tablet Take 1 tablet by mouth daily.    . Omega-3 Fatty Acids (FISH OIL) 1000 MG CAPS Take by mouth.    Marland Kitchen omeprazole (PRILOSEC) 20 MG capsule Take 20 mg by mouth every other day. PRN    . tamsulosin (FLOMAX) 0.4 MG CAPS capsule Take 0.4 mg by mouth. Reported on 05/29/2016    . TRESIBA FLEXTOUCH 200 UNIT/ML SOPN INJECT 40 UNITS UNDER THE SKIN DAILY 15 mL 1   No facility-administered medications prior to visit.    PAST MEDICAL HISTORY: Past Medical History:  Diagnosis Date  . CKD (chronic kidney disease), stage III   . Depression   . Diabetes mellitus without complication ([place])   . ED (erectile dysfunction)   . GERD (gastroesophageal reflux disease)   . Hyperlipidemia   .  Hypertension   . Plantar fasciitis   . Prostatitis     PAST SURGICAL HISTORY: Past Surgical History:  Procedure Laterality Date  . CIRCUMCISION    . COLONOSCOPY    . MOUTH SURGERY  01/2020    FAMILY HISTORY: Family History  Problem Relation Age of Onset  . Hepatitis C Mother   . Mesothelioma Father   . Diabetes Father   . Heart disease Father     SOCIAL HISTORY: Social History   Socioeconomic History  . Marital status: Married    Spouse name: Remo Webb  . Number of children: 1  . Years of education: Not on file  . Highest education level: Bachelor's degree (e.g., BA, AB, BS)   Occupational History    Comment: sales rep, retired  Tobacco Use  . Smoking status: Former Smoker    Quit date: 12/20/2008    Years since quitting: 11.1  . Smokeless tobacco: Never Used  Substance and Sexual Activity  . Alcohol use: Yes    Comment: rarely  . Drug use: No  . Sexual activity: Not on file  Other Topics Concern  . Not on file  Social History Narrative   01/26/20 Lives with Joel   Caffeine coffee 1 1/2 c daily   Social Determinants of Health   Financial Resource Strain:   . Difficulty of Paying Living Expenses: Not on file  Food Insecurity:   . Worried About Charity fundraiser in the Last Year: Not on file  . Ran Out of Food in the Last Year: Not on file  Transportation Needs:   . Lack of Transportation (Medical): Not on file  . Lack of Transportation (Non-Medical): Not on file  Physical Activity:   . Days of Exercise per Week: Not on file  . Minutes of Exercise per Session: Not on file  Stress:   . Feeling of Stress : Not on file  Social Connections:   . Frequency of Communication with Friends and Family: Not on file  . Frequency of Social Gatherings with Friends and Family: Not on file  . Attends Religious Services: Not on file  . Active Member of Clubs or Organizations: Not on file  . Attends Archivist Meetings: Not on file  . Marital Status: Not on file  Intimate Partner Violence:   . Fear of Current or Ex-Partner: Not on file  . Emotionally Abused: Not on file  . Physically Abused: Not on file  . Sexually Abused: Not on file     PHYSICAL EXAM  GENERAL EXAM/CONSTITUTIONAL: Vitals:  Vitals:   01/26/20 1241  BP: (!) 157/95  Pulse: 73  Temp: 97.7 F (36.5 C)  Weight: 186 lb 12.8 oz (84.7 kg)  Height: 5' 9.75" (1.772 m)   Body mass index is 27 kg/m. Wt Readings from Last 3 Encounters:  01/26/20 186 lb 12.8 oz (84.7 kg)  12/22/19 184 lb (83.5 kg)  04/01/19 189 lb 9.6 oz (86 kg)    Patient is in no distress; well developed,  nourished and groomed; neck is supple  CARDIOVASCULAR:  Examination of carotid arteries is normal; no carotid bruits  Regular rate and rhythm, no murmurs  Examination of peripheral vascular system by observation and palpation is normal  EYES:  Ophthalmoscopic exam of optic discs and posterior segments is normal; no papilledema or hemorrhages No exam data present  MUSCULOSKELETAL:  Gait, strength, tone, movements noted in Neurologic exam below  NEUROLOGIC: MENTAL STATUS:  MMSE - [place] Exam 01/26/2020  Orientation to time 4  Orientation to Place 4  Registration 3  Attention/ Calculation 5  Recall 3  Language- name 2 objects 2  Language- repeat 1  Language- follow 3 step command 3  Language- read & follow direction 1  Write a sentence 1  Copy design 1  Total score 28    awake, alert, oriented to person, place and time  recent and remote memory intact  normal attention and concentration  language fluent, comprehension intact, naming intact  fund of knowledge appropriate  CRANIAL NERVE:   2nd - no papilledema on fundoscopic exam  2nd, 3rd, 4th, 6th - pupils equal and reactive to light, visual fields full to confrontation, extraocular muscles intact, no nystagmus  5th - facial sensation symmetric  7th - facial strength symmetric  8th - hearing intact  9th - palate elevates symmetrically, uvula midline  11th - shoulder shrug symmetric  12th - tongue protrusion midline  MOTOR:   normal bulk and tone, full strength in the BUE, BLE  SENSORY:   normal and symmetric to light touch, temperature, vibration  COORDINATION:   finger-nose-finger, fine finger movements SLIGHTLY SLOW IN BUE; DECR IN LEFT FOOT TAPPING SPEED  REFLEXES:   deep tendon reflexes BRISK (3+ IN BUE; 2+ IN BLE) and symmetric; POSITIVE HOFFMANS  GAIT/STATION:   narrow based gait     DIAGNOSTIC DATA (LABS, IMAGING, TESTING) - I reviewed patient records, labs, notes,  testing and imaging myself where available.  Lab Results  Component Value Date   WBC 6.8 02/02/2016      Component Value Date/Time   NA 141 10/26/2019 0809   NA 137 02/02/2016 0000   K 4.2 10/26/2019 0809   CL 108 10/26/2019 0809   CO2 27 10/26/2019 0809   GLUCOSE 88 10/26/2019 0809   BUN 23 10/26/2019 0809   BUN 33 (A) 02/02/2016 0000   CREATININE 1.18 10/26/2019 0809   CALCIUM 9.2 10/26/2019 0809   PROT 6.3 06/28/2019 0749   ALBUMIN 3.8 06/28/2019 0749   AST 28 06/28/2019 0749   ALT 30 06/28/2019 0749   ALKPHOS 90 06/28/2019 0749   BILITOT 0.5 06/28/2019 0749   Lab Results  Component Value Date   CHOL 129 12/24/2016   HDL 23.70 (L) 12/24/2016   LDLCALC 97 02/02/2016   LDLDIRECT 69.0 10/26/2019   TRIG 210.0 (H) 12/24/2016   CHOLHDL 5 12/24/2016   Lab Results  Component Value Date   HGBA1C 6.6 (H) 10/26/2019   Lab Results  Component Value Date   VITAMINB12 541 12/22/2019   Lab Results  Component Value Date   TSH 1.71 02/02/2016    01/12/20 MRI brain [I reviewed images myself and agree with interpretation. -VRP]  - Mild atrophy and mild chronic small vessel ischemic disease. - No acute findings.  01/12/20 MRI cervical spine [I reviewed images myself and agree with interpretation. -VRP]  - At C3-4: disc bulging and facet hypertrophy with mild spinal stenosis and severe biforaminal stenosis. - At C2-3: disc bulging and facet hypertrophy with moderate biforaminal stenosis.    ASSESSMENT AND PLAN  74 y.o. year old adult here with 1 year of gait and balance difficulty, left foot incoordination, memory loss, personality mood changes.  We will proceed with further work-up  Dx:  1. Memory loss     PLAN:  MEMORY LOSS / BEHAVIOR / PERSONALITY changes - consider depression treatments per PCP; continue marital counseling - check neuropsych testing (eval for FTD)  GAIT DIFFICULTY (? diabetic neuropathy) -  unclear etiology; MRI brain and cervical spine  unremarkable - consider PT evaluation  Orders Placed This Encounter  Procedures  . Ambulatory referral to Neuropsychology   Return for pending if symptoms worsen or fail to improve.    Penni Bombard, MD [id number], [id number] PM Certified in Neurology, Neurophysiology and Neuroimaging  [facility] [address], [place], [place] [address] [phone]

## 2020-02-01 DIAGNOSIS — F320 Major depressive disorder, single episode, mild: Secondary | ICD-10-CM | POA: Diagnosis not present

## 2020-02-21 ENCOUNTER — Encounter: Payer: Self-pay | Admitting: Counselor

## 2020-02-21 ENCOUNTER — Ambulatory Visit: Payer: Medicare HMO

## 2020-02-21 ENCOUNTER — Other Ambulatory Visit: Payer: Self-pay

## 2020-02-21 ENCOUNTER — Ambulatory Visit (INDEPENDENT_AMBULATORY_CARE_PROVIDER_SITE_OTHER): Payer: Medicare HMO | Admitting: Counselor

## 2020-02-21 DIAGNOSIS — R413 Other amnesia: Secondary | ICD-10-CM

## 2020-02-21 DIAGNOSIS — R4689 Other symptoms and signs involving appearance and behavior: Secondary | ICD-10-CM | POA: Diagnosis not present

## 2020-02-21 DIAGNOSIS — R4189 Other symptoms and signs involving cognitive functions and awareness: Secondary | ICD-10-CM

## 2020-02-21 DIAGNOSIS — F329 Major depressive disorder, single episode, unspecified: Secondary | ICD-10-CM

## 2020-02-21 DIAGNOSIS — F32A Depression, unspecified: Secondary | ICD-10-CM

## 2020-02-21 NOTE — Progress Notes (Signed)
[facility]  Patient Name: Joel Webb MRN: [id number] Date of Birth: 12-May-1946 Age: 74 y.o. Education: 16 years  Referral Circumstances and Background Information  Joel Webb is a 74 y.o., right-hand dominant, married man with a history of hyperlipidemia, diabetes, high blood pressure, gait and balance difficulties and memory loss/personality/mood changes over the past year or so. He was referred by Dr. Leta Baptist with [facility] who was concerned about possible FTD.    On interview, the patient said his short term memory is not that great but his long term memory is fine. He thinks it bothers his wife, Joel Webb, more than it bothers him. His wife started noticing changes after she started working at home, in March. There was an incident when they went to a birthday party and he got so irritable about it that his wife told him he needed to go get help because she thought he was depressed. He has been seeing a counselor over about the past year and his wife thinks it has been helpful. Overall, it sounds as though the patient has always tended to be somewhat solitary, a bit isolated, and not big on communication but those tendencies have gotten worse over the past year and there is also some minor forgetfulness. The patient stated that he's never been a very patient person and is aware that he has become more irritable since he has gotten older. He gets frustrated with his wife, he snaps at her, and there has also been some suspiciousness (he apparently accused her of having a lesbian relationship with a friend of hers that he has "never liked."). There has been some "paranoia," he was sleeping with a gun by his bed for unknown reasons and his wife had a friend remove weapons from the house. She denied any safety concerns at present and there is no history of violence. They denied that [name] uses rough language in public, has strange new food  cravings, or has demonstrated any odd/inappropriate social behavior. He has some minor day-to-day forgetfulness such as leaving the burner on occasionally, some minor difficulties tracking appointments, and can be inattentive throughout the day. His inattentiveness is often noticed in conversations with his wife (he will just lose focus and go somewhere else), so some of it may be behavioral. His activity level is low although it doesn't sound like there is profound apathy. He has always been less social and he has no interest in socializing now. The patient was vague regarding his mood but denied feeling frankly depressed. His wife, on the other hand, thinks he "has to be" depressed. His energy is lower and he is taking more naps during the day. He is sleeping well, typically about 8 hours a night, although he does have to get up many times related to an enlarged prostate. His appetite is stable and they denied any strange new food cravings.   With respect to functioning, it sounds like there are some minor changes. The patient has mixed up a few appointments although he is still essentially managing his schedule of appointments himself. He has left the burner on the stove on although it is infrequent and he is still able to prepare meals at his regular level of skill. The patient manages finances, it doesn't sound like there are any major issues. Nevertheless, he did mix up the date on a check and also misplaced a check of his wife's, which prompted her to get more involved. The patient is  still driving, he admits that he is not as good at driving as he used to be and he can get distracted, although he hasn't gotten lost or had any accidents. He is still independent with respect to functioning in the community and is able to go grocery shopping and the like. He has given up some hobbies, he stopped playing golf and tennis, although some of that was related to plantar fascitis. He generally does fairly well with  orientation and does not lose the month or the year.   Past Medical History and Review of Relevant Studies   Patient Active Problem List   Diagnosis Date Noted   Essential hypertension 06/05/2017   Uncontrolled type 2 diabetes mellitus with hyperglycemia, with long-term current use of insulin ([place]) 02/28/2016   Mixed hyperlipidemia 02/28/2016    Review of Neuroimaging and Relevant Studies:  [name] has an MRI of the brain from 01/12/2020 that reveals mild to moderate generalized volume loss, mainly in the parietal and frontal lobes with relative temporal sparing. There is a mild burden of leukoaraiosis in the periventricular and subcortical cerebral white matter, not enough to cause a dementia level of function in my opinion and borderline as a significant contributor.    Current Outpatient Medications  Medication Sig Dispense Refill   amLODipine (NORVASC) 5 MG tablet      atorvastatin (LIPITOR) 80 MG tablet Take 80 mg by mouth daily.     BD INSULIN SYRINGE U/F 31G X 5/16" 1 ML MISC USE TO GIVE 2 INSULIN INJECTIONS DAILY 200 each 1   Dulaglutide (TRULICITY) A999333 [id number] SOPN INJECT 0.75 MILLIGRAMS UNDER THE SKIN ONCE A WEEK AS DIRECTED BY YOUR DOCTOR 6 mL 2   finasteride (PROSCAR) 5 MG tablet      glucose blood (ACCU-CHEK SMARTVIEW) test strip Use to check blood sugar 1 time per day. 50 each 3   Insulin Pen Needle (PEN NEEDLES) 31G X 6 MM MISC Use to inject insulin daily. 100 each 2   insulin regular (NOVOLIN R RELION) 100 units/mL injection Inject 0.1 mLs (10 Units total) into the skin daily. Inject 10 units under the skin once daily at supper (Patient taking differently: Inject 8 Units into the skin daily. Inject 8 units under the skin once daily at supper) 10 mL 3   losartan-hydrochlorothiazide (HYZAAR) 100-12.5 MG per tablet Take 1 tablet by mouth daily.     metFORMIN (GLUCOPHAGE) 1000 MG tablet TAKE ONE TABLET BY MOUTH TWICE A DAY WITH MEALS 180 tablet 0   Multiple  Vitamin (MULTIVITAMIN WITH MINERALS) TABS tablet Take 1 tablet by mouth daily.     Omega-3 Fatty Acids (FISH OIL) 1000 MG CAPS Take by mouth.     omeprazole (PRILOSEC) 20 MG capsule Take 20 mg by mouth every other day. PRN     tamsulosin (FLOMAX) 0.4 MG CAPS capsule Take 0.4 mg by mouth. Reported on 05/29/2016     TRESIBA FLEXTOUCH 200 UNIT/ML SOPN INJECT 40 UNITS UNDER THE SKIN DAILY 15 mL 1   No current facility-administered medications for this visit.   Family History  Problem Relation Age of Onset   Hepatitis C Mother    Mesothelioma Father    Diabetes Father    Heart disease Father    There is no  family history of dementia. There is no  family history of psychiatric illness, although he wonders if his mother had OCD.  Psychosocial History  Developmental, Educational and Employment History: The patient stated that he was  a good student who was never held back and didn't have any learning difficulties. He earned adequate grades and then went on to [employer] at [employer] and got a degree in business. 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 That business dried up and he retired a while after that and eventually retired around 10 years ago.   Psychiatric History: The patient denied any history of psychiatric illness prior to the events that brought him here. He recently started taking Cymbalta, he hasn't really noticed any changes. He is involved in counseling and has been doing that for approximately a year, he sees his practitioner once every couple of weeks. They are mainly working on communication.   Substance Use History: The patient doesn't currently consume alcohol, he stopped drinking when he quit smoking about 13 years ago. He smoked since he was in college.   Relationship History and Living Cimcumstances: The patient and his wife have been married for approximately 32 years. They have one daughter (it is the  patient's step daughter but he raised her from a young child).   Mental Status and Behavioral Observations  Sensorium/Arousal: The patient's level of arousal was awake and alert. Hearing and vision were adequate for testing purposes. He did use glasses as needed throughout the evaluation.  Orientation: The patient was fully oriented person, place, time and situations.  Appearance: Dressed in appropriate, casual clothing with reasonable grooming and hygiene.  Behavior: Generally appropriate, somewhat vague in answering questions  Speech/language: Normal in rate, rhythm, and volume.  Gait/Posture: Appeared normal on ambulation between rooms, narrow based, and reasonably stable.  Movement: No adventitious movements, hypomimia, or other overt signs/symptoms of movement disorder Social Comportment: Generally appropriate Mood: Not well described by the patient Affect: Neutral  Thought process/content: Logical, linear, goal directed, able to provide a reasonable detailed personal history and timeline.  Safety: No thoughts of harming self or others identified by the patient.  Insight: Fair  Test Procedures  Wide Range Achievement Test - 4             Word Reading Neuropsychological Assessment Battery  List Learning  Story Learning  Daily Living Memory  Naming  Digit Span Repeatable Battery for the Assessment of Neuropsychological Status (Form A)  Figure Copy  Judgment of Line Orientation  Coding  Figure Recall The Dot Counting Test A Random Letter Test Controlled Oral Word Association (F-A-S) Semantic Fluency (Animals) Trail Making Test A & B Complex Ideational Material Geriatric Depression Scale - Short Form Quick Dementia Rating System (completed by wife, Joel Webb)  Plan  MELESIO ETUE was seen for a psychiatric diagnostic evaluation and neuropsychological testing. My initial impression is that if there is an underlying process apart from depression responsible for Mr. Mccormac  behavior, it is too early to tell with any certainty. She describes a pattern of behavior that sounds as though it is an exacerbation of preexisting tendencies as opposed to a dramatic change and it has also been improving since he has been in counseling. They also deny behavioral disinhibition, profound apathy/intertia, perseverative, stereotyped or ritualistic behavior, hyperorality or detary changes although there is some loss of sympathy/empathy. Full and complete note with impressions, recommendations, and interpretation of test data to follow.   Viviano Simas Nicole Kindred, PsyD, [place] Clinical Neuropsychologist  Informed Consent and Coding/Compliance  Risks and benefits of the evaluation were discussed with the patient as were the limits of confidentiality. I conducted a clinical interview and neuropsychological testing (  more than two tests) with [name] and Lamar Benes, B.S. (Technician) assisted me in administering additional test procedures. The patient was able to tolerate the testing procedures and the patient (and/or family if applicable) is likely to benefit from further follow up to receive the diagnosis and treatment recommendations, which will be rendered at the next encounter. Billing below reflects technician time, my direct face-to-face time with the patient, time spent in test administration, and time spent in professional activities including but not limited to: neuropsychological test interpretation, integration of neuropsychological test data with clinical history, report preparation, treatment planning, care coordination, and review of diagnostically pertinent medical history or studies.   Services associated with this encounter: Clinical Interview [phone]) plus 60 minutes [id number]; Neuropsychological Evaluation by Professional)  120 minutes [id number]; Neuropsychological Evaluation by Professional, Adl.) 30 minutes [id number]; Neuropsychological Testing by Technician) 70 minutes  [id number]; Neuropsychological Testing by Technician, Adl.)

## 2020-02-21 NOTE — Progress Notes (Signed)
   Psychometrist Note   Cognitive testing was administered to [name] by Lamar Benes, B.S. (Technician) under the supervision of Alphonzo Severance, Psy.D., ABN. Mr. Mavis was able to tolerate all test procedures. Dr. Nicole Kindred met with the patient as needed to manage any emotional reactions to the testing procedures (if applicable). Rest breaks were offered.    The battery of tests administered was selected by Dr. Nicole Kindred with consideration to the patient's current level of functioning, the nature of his symptoms, emotional and behavioral responses during the interview, level of literacy, observed level of motivation/effort, and the nature of the referral question. This battery was communicated to the psychometrist. Communication between Dr. Nicole Kindred and the psychometrist was ongoing throughout the evaluation and Dr. Nicole Kindred was immediately accessible at all times. Dr. Nicole Kindred provided supervision to the technician on the date of this service, to the extent necessary to assure the quality of all services provided.    Mr. Maske will return in approximately one week for an interactive feedback session with Dr. Nicole Kindred, at which time male test performance, clinical impressions, and treatment recommendations will be reviewed in detail. The patient understands he can contact our office should he require our assistance before this time.   A total of 100 minutes of billable time were spent with [name] by the technician, including test administration and scoring time. Billing for these services is reflected in Dr. Les Pou note.   This note reflects time spent with the psychometrician and does not include test scores, clinical history, or any interpretations made by Dr. Nicole Kindred. The full report will follow in a separate note.

## 2020-02-24 ENCOUNTER — Encounter: Payer: Self-pay | Admitting: Counselor

## 2020-02-24 NOTE — Progress Notes (Signed)
[facility]  Patient Name: Joel Webb MRN: [id number] Date of Birth: [birth year] Age: 74 y.o. Education: 16 years  Measurement properties of test scores: IQ, Index, and Standard Scores (SS): Mean = 100; Standard Deviation = 15 Scaled Scores (Ss): Mean = 10; Standard Deviation = 3 Z scores (Z): Mean = 0; Standard Deviation = 1 T scores (T); Mean = 50; Standard Deviation = 10  TEST SCORES:    Note: This summary of test scores accompanies the interpretive report and should not be considered in isolation without reference to the appropriate sections in the text. Test scores are relative to age, gender, and educational history as available and appropriate.   Performance Validity        A Random Letter Test Raw  Descriptor      Errors 0 Within Expectation  The Dot Counting Test: 15 Within Expectation      Mental Status Screening     Total Score Descriptor  MoCA 25 Normal      Expected Functioning        Wide Range Achievement Test: Standard/Scaled Score Percentile      Word Reading 102 55      Attention/Processing Speed        Neuropsychological Assessment Battery (Attention Module, Form 1): T-score Percentile      Digits Forward 40 16      Digits Backwards 33 5      Repeatable Battery for the Assessment of Neuropsychological Status (Form A): Scaled Score Percentile      Coding 8 25      Language        Neuropsychological Assessment Battery (Language Module, Form 1): T-score Percentile      Naming 57 75      Verbal Fluency: T-score Percentile      Controlled Oral Word Association (F-A-S) 48 42      Semantic Fluency (Animals) 43 25      Memory:        Neuropsychological Assessment Battery (Memory Module, Form 1): T-score Percentile      List Learning           List A Immediate Recall   (6, 5, 6) 38 12         List B Immediate Recall   (2) 34 5         List A Short Delayed Recall   (5) 39 14         List A Long Delayed  Recall   (5) 41 18         List A Long Delayed Yes/No Recognition Hits   (8) -- <1         List A Long Delayed Yes/No Recognition False Alarms   (8) -- 14         List A Recognition Discriminability Index -- 2     Story Learning           Immediate Recall   (29, 32) 50 50         Delayed Recall   (33) 52 58      Daily Living Memory            Immediate Recall   (27, 23) 74 99          Delayed Recall   (9, 7) 62 88          Recognition Hits -- 62      Repeatable Battery for the Assessment of  Neuropsychological Status (Form A): Scaled Score Percentile         Figure Recall   (14) 11 63      Visuospatial/Constructional Functioning        Repeatable Battery for the Assessment of Neuropsychological Status (Form A): Standard/Scaled Score Percentile     Visuospatial/Constructional Index 75 5         Figure Copy 5 5         Judgment of Line Orientation --- 17-25      Executive Functioning        Modified [place] Card Sorting Test (MWCST): Standard/T-Score Percentile      Number of Categories Correct 32 4      Number of Perseverative Errors 50 50      Number of Total Errors 37 10      Percent Perseverative Errors 54 66  Executive Function Composite 85 16      Trail Making Test: T-Score Percentile      Part A 38 12      Part B 42 21      [place] Diagnostic Aphasia Exam: Raw Score Scaled Score      Complex Ideational Material 12 12      Clock Drawing Raw Score Descriptor      Command 7 Mild Impairment      Rating Scales        Quick Dementia Rating System Raw Score Descriptor      Sum of Boxes 3 Very Mild Dementia      Total Score 7 Mild Dementia  Geriatric Depression Scale - Short Form 5 Within Normal Limits    Peter V. Nicole Kindred PsyD, [place] Clinical Neuropsychologist

## 2020-02-28 ENCOUNTER — Ambulatory Visit (INDEPENDENT_AMBULATORY_CARE_PROVIDER_SITE_OTHER): Payer: Medicare HMO | Admitting: Counselor

## 2020-02-28 ENCOUNTER — Other Ambulatory Visit: Payer: Self-pay

## 2020-02-28 ENCOUNTER — Encounter: Payer: Self-pay | Admitting: Counselor

## 2020-02-28 DIAGNOSIS — R4689 Other symptoms and signs involving appearance and behavior: Secondary | ICD-10-CM | POA: Diagnosis not present

## 2020-02-28 DIAGNOSIS — F32A Depression, unspecified: Secondary | ICD-10-CM

## 2020-02-28 DIAGNOSIS — F329 Major depressive disorder, single episode, unspecified: Secondary | ICD-10-CM | POA: Diagnosis not present

## 2020-02-28 DIAGNOSIS — R4189 Other symptoms and signs involving cognitive functions and awareness: Secondary | ICD-10-CM | POA: Diagnosis not present

## 2020-02-28 NOTE — Progress Notes (Signed)
[facility]  I met with [name] to review the findings resulting from his neuropsychological evaluation. Since the last appointment, he has been about the same. Time was spent reviewing the impressions and recommendations that are detailed in the evaluation report. I explained that int he present case, we do not have enough for a diagnosis of frank cognitive impairment and that I think depression, relational issues, and other more common reversible factors are the likely causes of Mr. Bolander difficulties. I counseled them about things to watch out for in FTD and was candid about the fact that the condition is often misdiagnosed as depression or relationship distress. We now have a strong baseline against which to compare Mr. Becker future performance. Interventions provided during this encounter included psychoeducation, and other topics as reflected in the patient instructions. I took time to explain the findings and answer all the patient's questions. I encouraged Mr. Wrightsman to contact me should he have any further questions or if further follow up is desired.   Current Medications and Medical History   Current Outpatient Medications  Medication Sig Dispense Refill  . amLODipine (NORVASC) 5 MG tablet     . atorvastatin (LIPITOR) 80 MG tablet Take 80 mg by mouth daily.    . BD INSULIN SYRINGE U/F 31G X 5/16" 1 ML MISC USE TO GIVE 2 INSULIN INJECTIONS DAILY 200 each 1  . Dulaglutide (TRULICITY) 2.92 KM/6.2MM SOPN INJECT 0.75 MILLIGRAMS UNDER THE SKIN ONCE A WEEK AS DIRECTED BY YOUR DOCTOR 6 mL 2  . finasteride (PROSCAR) 5 MG tablet     . glucose blood (ACCU-CHEK SMARTVIEW) test strip Use to check blood sugar 1 time per day. 50 each 3  . Insulin Pen Needle (PEN NEEDLES) 31G X 6 MM MISC Use to inject insulin daily. 100 each 2  . insulin regular (NOVOLIN R RELION) 100 units/mL injection Inject 0.1 mLs (10 Units total) into the skin daily. Inject  10 units under the skin once daily at supper (Patient taking differently: Inject 8 Units into the skin daily. Inject 8 units under the skin once daily at supper) 10 mL 3  . losartan-hydrochlorothiazide (HYZAAR) 100-12.5 MG per tablet Take 1 tablet by mouth daily.    . metFORMIN (GLUCOPHAGE) 1000 MG tablet TAKE ONE TABLET BY MOUTH TWICE A DAY WITH MEALS 180 tablet 0  . Multiple Vitamin (MULTIVITAMIN WITH MINERALS) TABS tablet Take 1 tablet by mouth daily.    . Omega-3 Fatty Acids (FISH OIL) 1000 MG CAPS Take by mouth.    Marland Kitchen omeprazole (PRILOSEC) 20 MG capsule Take 20 mg by mouth every other day. PRN    . tamsulosin (FLOMAX) 0.4 MG CAPS capsule Take 0.4 mg by mouth. Reported on 05/29/2016    . TRESIBA FLEXTOUCH 200 UNIT/ML SOPN INJECT 40 UNITS UNDER THE SKIN DAILY 15 mL 1   No current facility-administered medications for this visit.    Patient Active Problem List   Diagnosis Date Noted  . Essential hypertension 06/05/2017  . Uncontrolled type 2 diabetes mellitus with hyperglycemia, with long-term current use of insulin ([place]) 02/28/2016  . Mixed hyperlipidemia 02/28/2016   Mental Status and Behavioral Observations  Mr. Santilli was available at the scheduled time for this appointment and was attended by his wife, [name]. He was alert, fully oriented, and participated actively in the encounter. His self-reported mood was "ok" and his affect was mainly neutral. His thought process was logical, linear, and goal-oriented and his thought content was  appropriate to the topics discussed. There were no safety concerns identified at this visit, such as thoughts of harming self or others.   Plan  Feedback provided regarding the patient's neuropsychological evaluation. At this point, if the patient does have a neurodegenerative cause for his "personality changes" and other issues, it is too early to tell. Reviewed cognitive wellness strategies with the [facility] and recommendations to continue in counseling etc.  [name] was encouraged to contact me if any questions arise or if further follow up is desired.   Viviano Simas Nicole Kindred, PsyD, [place] Clinical Neuropsychologist  Service(s) Provided at This Encounter: 50 minutes [phone]; Conjoint therapy with patient present)

## 2020-02-28 NOTE — Progress Notes (Signed)
[facility]  Patient Name: Joel Webb MRN: [id number] Date of Birth: [birth year] Age: 74 y.o. Education: 54 years  Clinical Impressions   TEVAUGHN SANTIZO is a 74 y.o., right-hand dominant, married man with a history of cognitive and behavioral changes (e.g., day-to-day forgetfulness, irritability, diminished attention/concentration, and some "paranoia") over the past year or so. He is still functioning well day-to-day and continues to make his own medical appointments, cook, do finances, and drive although there have been some minor changes in those activities. He has a mild to moderate degree of fronto-parietal volume loss and minimal leukoaraiosis on his recent 01/13/2020 MRI brain.   Neuropsychological test findings revealed fairly good performance in most areas. He does have several low scores on measures of working memory, executive function, and word-list memory encoding although I am hard pressed to say that they constitute any frank impairment. He did present as a bit impulsive and rushed on some tasks and may have a mild degree of executive difficulty. His wife characterizes him as functioning at a mild dementia level but I think much of that is on the basis of relational distress; they reported no more than mild changes on interview. He scored at the margin of positive on the GDS indicating that he often gets bored, does not feel happy most of the time, prefers to stay at home rather than going out, and does not feel full of energy.   Overall impression is one of at most mild cognitive impairment. Ms. Knuteson perceives her husband's behavior as a definite change, which is concerning, although at this point he is not demonstrating any clear behavioral disinhibition, profound apathy/intertia, perseverative or stereotyped behavior, or hyperorality and thus does not have any signs or symptoms very specific to FTD. She reported that he is improving  since starting counseling, which would also be unexpected if his difficulties were due to a frontotemporal neurodegenerative disorder. I do believe he has an affective disorder that is presenting primarily with irritability and behavioral withdrawal. I would suggest they continue to follow closely and that Mr. Kozik be re-evaluated in a year. Would also recommend that they continue in couples counseling.   Diagnostic Impressions: Other symptoms and signs involving cognitive functions and awareness Depressive disorder  Recommendations to be discussed with patient  Your performance and presentation on assessment today were not clearly abnormal. That is to say, you did fairly well on most test procedures. You did have a couple of low scores on measures of working memory and in other areas that may suggest some mild executive dysfunction but it is not enough for a formal diagnosis of cognitive impairment.   As for the possibility of frontotemporal dementia, I cannot be entirely sure but I think it is unlikely. While you do report some behavioral changes it sounds like they mainly involve things like irritability, exacerbation of preexisting tendencies towards social isolation/lack of activity, and conflict between the two of you. These types of problems can happen in FTD but they can also happen in late-life depression, which is much more common. The fact that your problems have improved as you have undergone counseling also makes me think they may not be due to FTD. This is with the caveat that often times, individuals with behavioral variant frontotemporal dementia are misdiagnosed as having depression and relational conflict and the possibility cannot be entirely ruled out.   The best course of action at this point in time would be to watch  and way and do what you can to work on your mood and relationship satisfaction. This would ideally include ongoing involvement in marital counseling, developing  effective communication strategies with one another, and the like.   It would be reasonable for you to return in 1 to 2 years for a follow up evaluation to see if there are any changes. If this is FTD, then the condition will declare itself with time. If it is not FTD and it is depression, then it will presumably improve over time as your underlying depressive issues improve.   A trial of antidepressant medication might be considered. If your behavioral withdrawal and irritability are due to depression, that could possibly help.   There is a significant research base and evidence of effectiveness for something called "behavioral activation," which is a fancy way of saying that you should increase your activity level. In general, people do not feel as happy or do as well when they are not doing much. This can include things like getting out for walks, re-engaging in hobbies, spending time with family or friends, or learning a new hobby. It's not so important what you do as that you enjoy it and stick with it. Depression can start a vicious cycle where you are not doing a lot because you don't feel well, which leads to less things to be excited and happy about, and thus more depression and behavioral avoidance. It can be hard to change this pattern once it has started but most people find that they feel better when they start doing more even if they don't enjoy it at first.   Some cognitive abilities (such as processing speed) naturally decline with age, but there are many things you can do to contribute to healthy cognitive aging. There is evidence from at least one study that a modified low carbohydrate mediterranean diet (the MIND-DASH) diet can contribute to healthy cognitive aging. There is also some evidence to suggest a beneficial effect of coffee (black coffee without sugar or other additives such as cream) for healthy aging. One glass of red wine a day may also contribute to healthy aging though at  two drinks you lose all benefit and at three drinks it may be doing more harm than good. Staying active, mentally and physically, are also crucially important. One of the best ways to do this is simply to stay active and engaged in your life, particularly with social activities. Challenging the mind and other cognitively stimulating activities are encouraged, consider learning a new hobby, reconnecting with old friends, reading an interesting and thought provoking book. It is not so much what you do that is important as it is that you enjoy it and stay at it.   Test Findings  Test scores are summarized in additional documentation associated with this encounter. Test scores are relative to age, gender, and educational history as available and appropriate. There were no concerns about performance validity as all findings fell within normal expectations.   General Intellectual Functioning/Achievement:  Performance on single word reading was average, which presents as a reasonable standard of comparison for Mr. Bruce cognitive test performance.   Attention and Processing Efficiency: Indicators of attention and working memory generated weak but not clearly abnormal performance. Digit repetition forward was low average and digit repetition backward was unusually low, considering age and education level. Nevertheless, he was able to correctly subtract 5/5 serial 7's from 100.   With respect to processing efficiency, good normal range scores were obtained on  all indicators with low average simple numeric sequencing and average timed number-symbol coding.   Language: Performance was normal on visual object confrontation naming. Normal scores were also obtained on fluency indicators with average phonemic fluency and low average semantic fluency.   Visuospatial Function: Visuospatial and constructional performance was unusually low at an index level; however, this was largely due to rushing and  inattentiveness on figure copy and as such is likely related to attentional and other issues as opposed to visuospatial problems. Figure copy was unusually low. Low average to average performance was demonstrated on judgment of angular line orientation.  Learning and Memory: Performance on measures of learning and memory was within normal limits albeit with some slight weaknesses on word list encoding that are not clearly abnormal. Good acquisition and retention of information across time were demonstrated.   In the verbal realm, immediate recall of a 12-item word list was 5, 6, and 5 words across three learning trials, which is a bit weak but nonetheless falls at a low average level. Immediate recall of a distractor alternate word list was unusually low, which suggests susceptibility to proactive interference. Despite weak encoding performance he recalled all 5 words he had learned on short and long delay intervals. Recognition discrimination for the words from the list versus false choices was low, largely due to a number of false positive errors from the distractor alternate list, reflecting mild source memory problems. Performance on story learning was good with average immediate and delayed recall. Daily living memory was very strong with superior range performance on immediate recall and high average performance on delayed recall.   In the visual realm, memory for a modestly complex figure stimulus was average.   Executive Functions: Performance across the test battery raises the issues of possible executive dysfunction given poor word list as compared to story encoding, some impulsiveness and inattentiveness on figure copy, but overall his performance on most measures within this domain was reasonable. Low average performance was demonstrated on the Executive Function Composite of the Modified [employer]. Performance was low average on alternating sequencing of numbers and letters.  Reasoning with verbal information was average and errorless on the Complex [employer]. Clock drawing was, by contrast, consistent with "mild impairment," although the only errors were mild issues with spatial arrangement of the numbers and swapping the position of the hands.   Rating Scale(s): Mr. Whitty was characterized as functioning at a very mild to mild dementia level by his wife, although her report on interview is more consistent with an MCI at most level of dysfunction. He screened at the margin of positive on the GDS, with the total score just below the clinical cutoff, which may in his case reflect a clinically significant level of symptoms given his clinical history.   Viviano Simas Nicole Kindred PsyD, [place] Clinical Neuropsychologist

## 2020-02-28 NOTE — Patient Instructions (Signed)
Your performance and presentation on assessment today were not clearly abnormal. That is to say, you did fairly well on most test procedures. You did have a couple of low scores on measures of working memory and in other areas that may suggest some mild executive dysfunction but it is not enough for a formal diagnosis of cognitive impairment in my opinion.   As for the possibility of frontotemporal dementia, I cannot be entirely sure but I think it is unlikely. While you do report some behavioral changes it sounds like they mainly involve things like irritability, exacerbation of preexisting tendencies towards social isolation/lack of activity, and conflict between the two of you. These types of problems can happen in FTD but they can also happen in late-life depression, which is much more common. The fact that your problems have improved as you have undergone counseling also makes me think they may not be due to FTD. This is with the caveat that often times, individuals with behavioral variant frontotemporal dementia are misdiagnosed as having depression and relational conflict and the possibility cannot be entirely ruled out.   The best course of action at this point in time would be to watch and way and do what you can to work on your mood and relationship satisfaction. This would ideally include ongoing involvement in marital counseling, developing effective communication strategies with one another, and the like.   It would be reasonable for you to return in 1 to 2 years for a follow up evaluation to see if there are any changes. If this is FTD, then the condition will declare itself with time. If it is not FTD and it is depression, then it will presumably improve over time as your underlying depressive issues improve.   You recently started a trial of Cymbalta, which is a good first line medication for depression. Work diligently with your prescribing provider to monitor your response to that.    There is a significant research base and evidence of effectiveness for something called "behavioral activation," which is a fancy way of saying that you should increase your activity level. In general, people do not feel as happy or do as well when they are not doing much. This can include things like getting out for walks, re-engaging in hobbies, spending time with family or friends, or learning a new hobby. It's not so important what you do as that you enjoy it and stick with it. Depression can start a vicious cycle where you are not doing a lot because you don't feel well, which leads to less things to be excited and happy about, and thus more depression and behavioral avoidance. It can be hard to change this pattern once it has started but most people find that they feel better when they start doing more even if they don't enjoy it at first.   Some cognitive abilities (such as processing speed) naturally decline with age, but there are many things you can do to contribute to healthy cognitive aging. There is evidence from at least one study that a modified low carbohydrate mediterranean diet (the MIND-DASH) diet can contribute to healthy cognitive aging. There is also some evidence to suggest a beneficial effect of coffee (black coffee without sugar or other additives such as cream) for healthy aging. One glass of red wine a day may also contribute to healthy aging though at two drinks you lose all benefit and at three drinks it may be doing more harm than good. Staying active, mentally and  physically, are also crucially important. One of the best ways to do this is simply to stay active and engaged in your life, particularly with social activities. Challenging the mind and other cognitively stimulating activities are encouraged, consider learning a new hobby, reconnecting with old friends, reading an interesting and thought provoking book. It is not so much what you do that is important as it is that you  enjoy it and stay at it.

## 2020-03-02 DIAGNOSIS — F320 Major depressive disorder, single episode, mild: Secondary | ICD-10-CM | POA: Diagnosis not present

## 2020-03-02 DIAGNOSIS — R42 Dizziness and giddiness: Secondary | ICD-10-CM | POA: Diagnosis not present

## 2020-03-02 DIAGNOSIS — I10 Essential (primary) hypertension: Secondary | ICD-10-CM | POA: Diagnosis not present

## 2020-03-02 DIAGNOSIS — R269 Unspecified abnormalities of gait and mobility: Secondary | ICD-10-CM | POA: Diagnosis not present

## 2020-03-24 ENCOUNTER — Other Ambulatory Visit: Payer: Self-pay | Admitting: Endocrinology

## 2020-03-24 DIAGNOSIS — R3912 Poor urinary stream: Secondary | ICD-10-CM | POA: Diagnosis not present

## 2020-03-24 DIAGNOSIS — N403 Nodular prostate with lower urinary tract symptoms: Secondary | ICD-10-CM | POA: Diagnosis not present

## 2020-03-24 DIAGNOSIS — R351 Nocturia: Secondary | ICD-10-CM | POA: Diagnosis not present

## 2020-04-07 ENCOUNTER — Ambulatory Visit: Payer: Medicare HMO | Admitting: Endocrinology

## 2020-04-07 ENCOUNTER — Encounter: Payer: Self-pay | Admitting: Endocrinology

## 2020-04-07 ENCOUNTER — Other Ambulatory Visit: Payer: Self-pay

## 2020-04-07 VITALS — BP 122/86 | HR 65 | Temp 97.5°F | Ht 69.75 in | Wt 185.4 lb

## 2020-04-07 DIAGNOSIS — E1165 Type 2 diabetes mellitus with hyperglycemia: Secondary | ICD-10-CM | POA: Diagnosis not present

## 2020-04-07 DIAGNOSIS — I10 Essential (primary) hypertension: Secondary | ICD-10-CM

## 2020-04-07 DIAGNOSIS — E1142 Type 2 diabetes mellitus with diabetic polyneuropathy: Secondary | ICD-10-CM | POA: Diagnosis not present

## 2020-04-07 DIAGNOSIS — Z794 Long term (current) use of insulin: Secondary | ICD-10-CM

## 2020-04-07 DIAGNOSIS — E782 Mixed hyperlipidemia: Secondary | ICD-10-CM

## 2020-04-07 LAB — BASIC METABOLIC PANEL
BUN: 26 mg/dL — ABNORMAL HIGH (ref 6–23)
CO2: 30 mEq/L (ref 19–32)
Calcium: 9.5 mg/dL (ref 8.4–10.5)
Chloride: 105 mEq/L (ref 96–112)
Creatinine, Ser: 1.19 mg/dL (ref 0.40–1.50)
GFR: 59.84 mL/min — ABNORMAL LOW (ref >60.00)
Glucose, Bld: 112 mg/dL — ABNORMAL HIGH (ref 70–99)
Potassium: 4.7 mEq/L (ref 3.5–5.1)
Sodium: 138 mEq/L (ref 135–145)

## 2020-04-07 LAB — POCT GLYCOSYLATED HEMOGLOBIN (HGB A1C): Hemoglobin A1C: 6.6 % — AB (ref 4.0–5.6)

## 2020-04-07 LAB — LDL CHOLESTEROL, DIRECT: Direct LDL: 84 mg/dL

## 2020-04-07 MED ORDER — INSULIN REGULAR HUMAN 100 UNIT/ML IJ SOLN: 8.0000 [IU] | Freq: Two times a day (BID) | INTRAMUSCULAR | 3 refills | Status: DC

## 2020-04-07 NOTE — Telephone Encounter (Signed)
Rx sent today during visit

## 2020-04-07 NOTE — Progress Notes (Signed)
Patient ID: Joel Webb, adult   DOB: Apr 29, 1946, 74 y.o.   MRN: [id number]           Reason for Appointment: Follow-up for Type 2 Diabetes and related problems    Referring physician: Dr. Moreen Fowler  History of Present Illness:          Date of diagnosis of type 2 diabetes mellitus: 2007       Background history:   He is unclear when he was diagnosed to have diabetes but was not very symptomatic He had been on metformin alone for several years with reportedly good control Also had previously been very consistent with exercise and diet His blood sugars probably started going up 3-4 years ago and he was given Lantus insulin in addition He was seen in consultation in 4/17 with an A1c of 8.4 and he was started Trulicity in addition to his Lantus and metformin This was stopped in 7/17 because of high out-of-pocket expense  The V-go pump was stopped because he found it too uncomfortable and not sticking well  Recent history:   INSULIN regimen is: [place] 40 units in p.m., Novolin R 8 units at supper  Non-insulin hypoglycemic drugs the patient is taking are: metformin 1 g twice a day, Trulicity A999333 mg weekly   Current blood sugar patterns and problems identified:   His A1c is stable at 6.6   Although his A1c is the same his blood sugars appear to be higher after meals  Again he is checking blood sugars somewhat irregularly and has only 1 reading after dinner in the last month  Yesterday he had dessert along with his sandwich and his blood sugar was 312 after lunch  Otherwise it is over 200 postprandially  Despite high readings after lunch he does not take any regular insulin before eating lunch  He is usually remembering to take his Novolin R before dinner  However he may have been forgetting to take his evening Metformin and recently trying to take it more regularly along with his other medications before dinner  FASTING readings appear to be fairly good and was 112  in the lab  He has not missed any doses of Trulicity  Also taking slightly less [place] compared to his last visit  He thinks his weight is about the same  He is trying to walk or recently going to exercise at the [facility] twice a week    Side effects from medications have been: None Exercise: Walking 15 to 20 minutes usually  Compliance with the medical regimen: fair   Hypoglycemia: none    Glucose monitoring:  done less than 1 times a day         Glucometer:  Accu-Chek Blood Glucose readings by download:    PRE-MEAL Fasting Lunch Dinner Bedtime Overall  Glucose range: 108-146   173 212   Mean/median:     199   POST-MEAL PC Breakfast PC Lunch PC Dinner  Glucose range:   216-312   Mean/median:      Prior results:  PRE-MEAL Fasting Lunch Dinner Bedtime Overall  Glucose range:  79-150      Mean/median:     ?   POST-MEAL PC Breakfast PC Lunch PC Dinner  Glucose range:    120-235  Mean/median:       Self-care:  Usually tries to limit High-fat foods and portions                 Dietician visit, most recent:6/17  Weight history:  Wt Readings from Last 3 Encounters:  04/07/20 185 lb 6.4 oz (84.1 kg)  01/26/20 186 lb 12.8 oz (84.7 kg)  12/22/19 184 lb (83.5 kg)    Glycemic control:   Lab Results  Component Value Date   HGBA1C 6.6 (A) 04/07/2020   HGBA1C 6.6 (H) 10/26/2019   HGBA1C 6.6 (H) 06/28/2019   Lab Results  Component Value Date   MICROALBUR 78.4 (H) 10/26/2019   LDLCALC 97 02/02/2016   CREATININE 1.19 04/07/2020   Lab Results  Component Value Date   MICRALBCREAT 71.0 (H) 10/26/2019    Lab Results  Component Value Date   FRUCTOSAMINE 266 06/02/2017   FRUCTOSAMINE 233 02/04/2017   FRUCTOSAMINE 229 06/25/2016     Other problems addressed today: See review of systems   Office Visit on 04/07/2020  Component Date Value Ref Range Status  . Hemoglobin A1C 04/07/2020 6.6* 4.0 - 5.6 % Final  . Sodium 04/07/2020 138  135 - 145  mEq/L Final  . Potassium 04/07/2020 4.7  3.5 - 5.1 mEq/L Final  . Chloride 04/07/2020 105  96 - 112 mEq/L Final  . CO2 04/07/2020 30  19 - 32 mEq/L Final  . Glucose, Bld 04/07/2020 112* 70 - 99 mg/dL Final  . BUN 04/07/2020 26* 6 - 23 mg/dL Final  . Creatinine, Ser 04/07/2020 1.19  0.40 - 1.50 mg/dL Final  . GFR 04/07/2020 59.84* >60.00 mL/min Final  . Calcium 04/07/2020 9.5  8.4 - 10.5 mg/dL Final  . Direct LDL 04/07/2020 84.0  mg/dL Final   Optimal:  <100 mg/dLNear or Above Optimal:  100-129 mg/dLBorderline High:  130-159 mg/dLHigh:  160-189 mg/dLVery High:  >190 mg/dL      Allergies as of 04/07/2020   No Known Allergies     Medication List       Accurate as of Apr 07, 2020  2:45 PM. If you have any questions, ask your nurse or doctor.        Accu-Chek SmartView test strip Generic drug: glucose blood Use to check blood sugar 1 time per day.   amLODipine 5 MG tablet Commonly known as: NORVASC   atorvastatin 80 MG tablet Commonly known as: LIPITOR Take 80 mg by mouth daily.   BD Insulin Syringe U/F 31G X 5/16" 1 ML Misc Generic drug: Insulin Syringe-Needle U-100 USE TO GIVE 2 INSULIN INJECTIONS DAILY   finasteride 5 MG tablet Commonly known as: PROSCAR   Fish Oil 1000 MG Caps Take by mouth.   insulin regular 100 units/mL injection Commonly known as: NovoLIN R ReliOn Inject 0.08 mLs (8 Units total) into the skin 2 (two) times daily before a meal. Inject 10 units under the skin once daily at supper What changed:   how much to take  when to take this Changed by: Elayne Snare, MD   losartan-hydrochlorothiazide 100-12.5 MG tablet Commonly known as: HYZAAR Take 1 tablet by mouth daily.   metFORMIN 1000 MG tablet Commonly known as: GLUCOPHAGE TAKE ONE TABLET BY MOUTH TWICE A DAY WITH MEALS   multivitamin with minerals Tabs tablet Take 1 tablet by mouth daily.   omeprazole 20 MG capsule Commonly known as: PRILOSEC Take 20 mg by mouth every other day. PRN    Pen Needles 31G X 6 MM Misc Use to inject insulin daily.   tamsulosin 0.4 MG Caps capsule Commonly known as: FLOMAX Take 0.4 mg by mouth. Reported on 05/29/2016   Tyler Aas FlexTouch 200 UNIT/ML FlexTouch Pen Generic drug: insulin degludec INJECT  40 UNITS UNDER THE SKIN DAILY   Trulicity A999333 [id number] Sopn Generic drug: Dulaglutide INJECT 0.75 MILLIGRAMS UNDER THE SKIN ONCE A WEEK AS DIRECTED BY YOUR DOCTOR       Allergies: No Known Allergies  Past Medical History:  Diagnosis Date  . CKD (chronic kidney disease), stage III   . Depression   . Diabetes mellitus without complication ([place])   . ED (erectile dysfunction)   . GERD (gastroesophageal reflux disease)   . Hyperlipidemia   . Hypertension   . Plantar fasciitis   . Prostatitis     Past Surgical History:  Procedure Laterality Date  . CIRCUMCISION    . COLONOSCOPY    . MOUTH SURGERY  01/2020    Family History  Problem Relation Age of Onset  . Hepatitis C Mother   . Mesothelioma Father   . Diabetes Father   . Heart disease Father     Social History:  reports that he quit smoking about 11 years ago. He has never used smokeless tobacco. He reports current alcohol use. He reports that he does not use drugs.    Review of Systems     RENAL dysfunction: Creatinine is stable, previously higher  Lab Results  Component Value Date   CREATININE 1.19 04/07/2020   CREATININE 1.18 10/26/2019   CREATININE 1.12 06/28/2019    Lipid history: Has been on Lipitor management  Labs show LDL 96 in 12/20 Now 84 but triglycerides are higher  Also tends to have higher triglycerides   Lab Results  Component Value Date   CHOL 129 12/24/2016   HDL 23.70 (L) 12/24/2016   LDLCALC 97 02/02/2016   LDLDIRECT 84.0 04/07/2020   TRIG 210.0 (H) 12/24/2016   CHOLHDL 5 12/24/2016           Hypertension:Long-standing, treated with Hyzaar, amlodipine 5 mg daily, followed by PCP   He usually says his blood pressure is  controlled, recent reading range about 130/85  Blood pressure was 148/91 with PCP recently  BP Readings from Last 3 Encounters:  04/07/20 122/86  01/26/20 (!) 157/95  12/22/19 140/84    Review of Systems  He says he sometimes gets off balance, no numbness in toes  Most recent eye exam was in 6/20  Most recent foot exam: 5/21    Physical Examination:  BP 122/86 (BP Location: Left Arm, Patient Position: Sitting, Cuff Size: Normal)   Pulse 65   Temp (!) 97.5 F (36.4 C) (Oral)   Ht 5' 9.75" (1.772 m)   Wt 185 lb 6.4 oz (84.1 kg)   SpO2 98%   BMI 26.79 kg/m   Diabetic Foot Exam - Simple   Simple Foot Form Visual Inspection No deformities, no ulcerations, no other skin breakdown bilaterally: Yes Sensation Testing See comments: Yes Pulse Check Posterior Tibialis and Dorsalis pulse intact bilaterally: Yes See comments: Yes Comments Slight decrease in the monofilament sensation on the toes on the left compared to the right Posterior tibialis better felt in dorsalis pedis    Vibration sense testing in distal toes appears to be slightly less on the right   ASSESSMENT:  Diabetes type 2, on insulin  See history of present illness for  discussion of current diabetes management, blood sugar patterns and problems identified  His A1c is unchanged at 6.6  His blood sugars are not well controlled after at least lunch and maybe not after dinner also with periodic high readings over 200 Again his testing is sporadic Usually fasting readings are  fairly good May be more insulin deficient now  Recommendations as below  HYPERLIPIDEMIA: Last LDL was slightly high at 105, defer management to PCP   HYPERTENSION: His blood pressure is followed by PCP Also has increased microalbumin   PLAN:     Since his readings after lunch are appearing to be over 200 consistently we will have him add another dose of Novolin R at lunchtime  Can also consider increasing Trulicity  He  needs to check his sugars consistently after lunch and dinner  If he is having blood sugars outside the target range 2 hours after eating he can go up or down 2 units on the regular insulin  Again if eating larger meals or desserts he will need to go up another 2 units  Stay on 40 [place]  Regular exercise  Follow-up with PCP regarding blood pressure control, likely needs a better ARB than losartan or changing amlodipine to diltiazem for renal benefits  Also to follow-up with PCP regarding balance difficulties  Patient Instructions  Check blood sugars on waking up 3 days a week  Also check blood sugars about 2 hours after meals and do this after different meals by rotation  Recommended blood sugar levels on waking up are 90-130 and about 2 hours after meal is 130-180  Please bring your blood sugar monitor to each visit, thank you  Take 8 R before lunch too   This note is not being shared with the patient for the following reason: To respect privacy (The patient or proxy has requested that the information not be shared).     Elayne Snare 04/07/2020, 2:45 PM   Note: This office note was prepared with Dragon voice recognition system technology. Any transcriptional errors that result from this process are unintentional.

## 2020-04-07 NOTE — Patient Instructions (Signed)
Check blood sugars on waking up 3 days a week  Also check blood sugars about 2 hours after meals and do this after different meals by rotation  Recommended blood sugar levels on waking up are 90-130 and about 2 hours after meal is 130-180  Please bring your blood sugar monitor to each visit, thank you  Take 8 R before lunch too

## 2020-04-09 NOTE — Progress Notes (Signed)
Please call to let patient know that the lab results are good and have been forwarded to PCP

## 2020-05-02 DIAGNOSIS — R269 Unspecified abnormalities of gait and mobility: Secondary | ICD-10-CM | POA: Diagnosis not present

## 2020-05-02 DIAGNOSIS — K219 Gastro-esophageal reflux disease without esophagitis: Secondary | ICD-10-CM | POA: Diagnosis not present

## 2020-05-02 DIAGNOSIS — N400 Enlarged prostate without lower urinary tract symptoms: Secondary | ICD-10-CM | POA: Diagnosis not present

## 2020-05-02 DIAGNOSIS — E1122 Type 2 diabetes mellitus with diabetic chronic kidney disease: Secondary | ICD-10-CM | POA: Diagnosis not present

## 2020-05-02 DIAGNOSIS — I10 Essential (primary) hypertension: Secondary | ICD-10-CM | POA: Diagnosis not present

## 2020-05-02 DIAGNOSIS — D692 Other nonthrombocytopenic purpura: Secondary | ICD-10-CM | POA: Diagnosis not present

## 2020-05-02 DIAGNOSIS — E782 Mixed hyperlipidemia: Secondary | ICD-10-CM | POA: Diagnosis not present

## 2020-05-02 DIAGNOSIS — F339 Major depressive disorder, recurrent, unspecified: Secondary | ICD-10-CM | POA: Diagnosis not present

## 2020-05-02 DIAGNOSIS — N183 Chronic kidney disease, stage 3 unspecified: Secondary | ICD-10-CM | POA: Diagnosis not present

## 2020-05-10 ENCOUNTER — Telehealth: Payer: Self-pay | Admitting: Endocrinology

## 2020-05-10 NOTE — Telephone Encounter (Signed)
Patient called re: Patient deactivated his MyChart account due to suspicious activity-possible hacking. Patient requests that no type of results or messages be sent to Patient via [place].

## 2020-05-10 NOTE — Telephone Encounter (Signed)
Noted  

## 2020-05-15 DIAGNOSIS — D220 Melanocytic nevi of lip: Secondary | ICD-10-CM | POA: Diagnosis not present

## 2020-05-15 DIAGNOSIS — L570 Actinic keratosis: Secondary | ICD-10-CM | POA: Diagnosis not present

## 2020-05-15 DIAGNOSIS — D692 Other nonthrombocytopenic purpura: Secondary | ICD-10-CM | POA: Diagnosis not present

## 2020-05-15 DIAGNOSIS — L821 Other seborrheic keratosis: Secondary | ICD-10-CM | POA: Diagnosis not present

## 2020-05-15 DIAGNOSIS — D485 Neoplasm of uncertain behavior of skin: Secondary | ICD-10-CM | POA: Diagnosis not present

## 2020-05-15 DIAGNOSIS — C44719 Basal cell carcinoma of skin of left lower limb, including hip: Secondary | ICD-10-CM | POA: Diagnosis not present

## 2020-05-15 DIAGNOSIS — L814 Other melanin hyperpigmentation: Secondary | ICD-10-CM | POA: Diagnosis not present

## 2020-05-15 DIAGNOSIS — D225 Melanocytic nevi of trunk: Secondary | ICD-10-CM | POA: Diagnosis not present

## 2020-05-15 DIAGNOSIS — Z85828 Personal history of other malignant neoplasm of skin: Secondary | ICD-10-CM | POA: Diagnosis not present

## 2020-05-23 ENCOUNTER — Other Ambulatory Visit: Payer: Self-pay

## 2020-05-23 ENCOUNTER — Other Ambulatory Visit: Payer: Medicare HMO

## 2020-05-23 DIAGNOSIS — Z794 Long term (current) use of insulin: Secondary | ICD-10-CM | POA: Diagnosis not present

## 2020-05-23 DIAGNOSIS — E1165 Type 2 diabetes mellitus with hyperglycemia: Secondary | ICD-10-CM | POA: Diagnosis not present

## 2020-05-23 LAB — COMPREHENSIVE METABOLIC PANEL
ALT: 27 U/L (ref 0–53)
AST: 23 U/L (ref 0–37)
Albumin: 3.9 g/dL (ref 3.5–5.2)
Alkaline Phosphatase: 85 U/L (ref 39–117)
BUN: 25 mg/dL — ABNORMAL HIGH (ref 6–23)
CO2: 27 mEq/L (ref 19–32)
Calcium: 9.2 mg/dL (ref 8.4–10.5)
Chloride: 104 mEq/L (ref 96–112)
Creatinine, Ser: 1.22 mg/dL (ref 0.40–1.50)
GFR: 58.13 mL/min — ABNORMAL LOW (ref >60.00)
Glucose, Bld: 180 mg/dL — ABNORMAL HIGH (ref 70–99)
Potassium: 4.5 mEq/L (ref 3.5–5.1)
Sodium: 139 mEq/L (ref 135–145)
Total Bilirubin: 0.4 mg/dL (ref 0.2–1.2)
Total Protein: 6.3 g/dL (ref 6.0–8.3)

## 2020-05-23 LAB — MICROALBUMIN / CREATININE URINE RATIO
Creatinine,U: 83.4 mg/dL
Microalb Creat Ratio: 69.6 mg/g — ABNORMAL HIGH (ref 0.0–30.0)
Microalb, Ur: 58.1 mg/dL — ABNORMAL HIGH (ref 0.0–1.9)

## 2020-05-23 LAB — HEMOGLOBIN A1C: Hgb A1c MFr Bld: 7.7 % — ABNORMAL HIGH (ref 4.6–6.5)

## 2020-05-25 ENCOUNTER — Ambulatory Visit (INDEPENDENT_AMBULATORY_CARE_PROVIDER_SITE_OTHER): Payer: Medicare HMO | Admitting: Endocrinology

## 2020-05-25 ENCOUNTER — Other Ambulatory Visit: Payer: Self-pay

## 2020-05-25 ENCOUNTER — Encounter: Payer: Self-pay | Admitting: Endocrinology

## 2020-05-25 VITALS — BP 128/82 | HR 77 | Ht 69.0 in | Wt 190.4 lb

## 2020-05-25 DIAGNOSIS — R809 Proteinuria, unspecified: Secondary | ICD-10-CM

## 2020-05-25 DIAGNOSIS — E1129 Type 2 diabetes mellitus with other diabetic kidney complication: Secondary | ICD-10-CM | POA: Diagnosis not present

## 2020-05-25 DIAGNOSIS — I10 Essential (primary) hypertension: Secondary | ICD-10-CM | POA: Diagnosis not present

## 2020-05-25 DIAGNOSIS — Z794 Long term (current) use of insulin: Secondary | ICD-10-CM | POA: Diagnosis not present

## 2020-05-25 DIAGNOSIS — E1165 Type 2 diabetes mellitus with hyperglycemia: Secondary | ICD-10-CM

## 2020-05-25 HISTORY — DX: Proteinuria, unspecified: E11.29

## 2020-05-25 LAB — GLUCOSE, POCT (MANUAL RESULT ENTRY): POC Glucose: 245 mg/dl — AB (ref 70–99)

## 2020-05-25 MED ORDER — TRULICITY 1.5 MG/0.5ML ~~LOC~~ SOAJ: SUBCUTANEOUS | 2 refills | Status: DC

## 2020-05-25 NOTE — Patient Instructions (Addendum)
6 Units R insulin 8 at lunch and 12 at dinner  Check blood sugars on waking up days a week  Also check blood sugars about 2 hours after meals and do this after different meals by rotation  Recommended blood sugar levels on waking up are 90-130 and about 2 hours after meal is 130-180  Please bring your blood sugar monitor to each visit, thank you

## 2020-05-25 NOTE — Progress Notes (Signed)
Patient ID: Joel Webb, adult   DOB: 1946-11-06, 74 y.o.   MRN: [id number]           Reason for Appointment: Follow-up for Type 2 Diabetes and related problems    Referring physician: Dr. Moreen Fowler  History of Present Illness:          Date of diagnosis of type 2 diabetes mellitus: 2007       Background history:   He is unclear when he was diagnosed to have diabetes but was not very symptomatic He had been on metformin alone for several years with reportedly good control Also had previously been very consistent with exercise and diet His blood sugars probably started going up 3-4 years ago and he was given Lantus insulin in addition He was seen in consultation in 4/17 with an A1c of 8.4 and he was started Trulicity in addition to his Lantus and metformin This was stopped in 7/17 because of high out-of-pocket expense  The V-go pump was stopped because he found it too uncomfortable and not sticking well  Recent history:   INSULIN regimen is: [place] 40 units in p.m., Novolin R 8 at lunchtime--10 units at supper  Non-insulin hypoglycemic drugs the patient is taking are: metformin 1 g twice a day, Trulicity 7.84 mg weekly   Current blood sugar patterns and problems identified:   His A1c is higher than usual at 7.7, previously was at 6.6   He did not bring his monitor for download  Not clear why his blood sugars are overall higher especially after meals  He may be getting more snacks and has gained 5 pounds  This is despite trying to go somewhat regularly to the [facility] for exercise  He also thinks he has been getting more carbohydrates in his diet and less salads   Occasionally if he is not at home he will not take his Novolin regular insulin and may not always take it 30 minutes before eating  Fasting blood sugars are sometimes higher but not consistently by recall  He thinks he can continue to afford the Trulicity, previously has had fairly good results only with  0.75 mg dose  Side effects from medications have been: None Exercise: Walking 15 to 20 minutes usually  Compliance with the medical regimen: fair   Hypoglycemia: none    Glucose monitoring:  done less than 1 times a day         Glucometer:  Accu-Chek Blood Glucose readings by recall:   PRE-MEAL Fasting Lunch Dinner Bedtime Overall  Glucose range:       Mean/median:        POST-MEAL PC Breakfast PC Lunch PC Dinner  Glucose range:     Mean/median:       :    PRE-MEAL Fasting Lunch Dinner Bedtime Overall  Glucose range: 108-146   173 212   Mean/median:     199   POST-MEAL PC Breakfast PC Lunch PC Dinner  Glucose range:   216-312   Mean/median:      Prior results:  PRE-MEAL Fasting Lunch Dinner Bedtime Overall  Glucose range:  79-150      Mean/median:     ?   POST-MEAL PC Breakfast PC Lunch PC Dinner  Glucose range:    120-235  Mean/median:       Self-care:  Usually tries to limit High-fat foods and portions                 Dietician visit, most  recent:6/17                Weight history:  Wt Readings from Last 3 Encounters:  05/25/20 190 lb 6.4 oz (86.4 kg)  04/07/20 185 lb 6.4 oz (84.1 kg)  01/26/20 186 lb 12.8 oz (84.7 kg)    Glycemic control:   Lab Results  Component Value Date   HGBA1C 7.7 (H) 05/23/2020   HGBA1C 6.6 (A) 04/07/2020   HGBA1C 6.6 (H) 10/26/2019   Lab Results  Component Value Date   MICROALBUR 58.1 (H) 05/23/2020   LDLCALC 97 02/02/2016   CREATININE 1.22 05/23/2020   Lab Results  Component Value Date   MICRALBCREAT 69.6 (H) 05/23/2020    Lab Results  Component Value Date   FRUCTOSAMINE 266 06/02/2017   FRUCTOSAMINE 233 02/04/2017   FRUCTOSAMINE 229 06/25/2016     Other problems addressed today: See review of systems   Office Visit on 05/25/2020  Component Date Value Ref Range Status  . POC Glucose 05/25/2020 245* 70 - 99 mg/dl Final  Lab on 05/23/2020  Component Date Value Ref Range Status  . Sodium 05/23/2020  139  135 - 145 mEq/L Final  . Potassium 05/23/2020 4.5  3.5 - 5.1 mEq/L Final  . Chloride 05/23/2020 104  96 - 112 mEq/L Final  . CO2 05/23/2020 27  19 - 32 mEq/L Final  . Glucose, Bld 05/23/2020 180* 70 - 99 mg/dL Final  . BUN 05/23/2020 25* 6 - 23 mg/dL Final  . Creatinine, Ser 05/23/2020 1.22  0.40 - 1.50 mg/dL Final  . Total Bilirubin 05/23/2020 0.4  0.2 - 1.2 mg/dL Final  . Alkaline Phosphatase 05/23/2020 85  39 - 117 U/L Final  . AST 05/23/2020 23  0 - 37 U/L Final  . ALT 05/23/2020 27  0 - 53 U/L Final  . Total Protein 05/23/2020 6.3  6.0 - 8.3 g/dL Final  . Albumin 05/23/2020 3.9  3.5 - 5.2 g/dL Final  . GFR 05/23/2020 58.13* >60.00 mL/min Final  . Calcium 05/23/2020 9.2  8.4 - 10.5 mg/dL Final  . Microalb, Ur 05/23/2020 58.1* 0.0 - 1.9 mg/dL Final  . Creatinine,U 05/23/2020 83.4  mg/dL Final  . Microalb Creat Ratio 05/23/2020 69.6* 0.0 - 30.0 mg/g Final  . Hgb A1c MFr Bld 05/23/2020 7.7* 4.6 - 6.5 % Final   Glycemic Control Guidelines for People with Diabetes:Non Diabetic:  <6%Goal of Therapy: <7%Additional Action Suggested:  >8%       Allergies as of 05/25/2020   No Known Allergies     Medication List       Accurate as of May 25, 2020 12:31 PM. If you have any questions, ask your nurse or doctor.        STOP taking these medications   Trulicity 7.56 EP/3.2RJ Sopn Generic drug: Dulaglutide Replaced by: Trulicity 1.5 JO/8.4ZY Sopn Stopped by: Elayne Snare, MD     TAKE these medications   Accu-Chek SmartView test strip Generic drug: glucose blood Use to check blood sugar 1 time per day.   amLODipine 5 MG tablet Commonly known as: NORVASC   atorvastatin 80 MG tablet Commonly known as: LIPITOR Take 80 mg by mouth daily.   BD Insulin Syringe U/F 31G X 5/16" 1 ML Misc Generic drug: Insulin Syringe-Needle U-100 USE TO GIVE 2 INSULIN INJECTIONS DAILY   finasteride 5 MG tablet Commonly known as: PROSCAR   Fish Oil 1000 MG Caps Take by mouth.   insulin  regular 100 units/mL injection Commonly known  as: NovoLIN R ReliOn Inject 0.08 mLs (8 Units total) into the skin 2 (two) times daily before a meal. Inject 10 units under the skin once daily at supper What changed: additional instructions   losartan-hydrochlorothiazide 100-12.5 MG tablet Commonly known as: HYZAAR Take 1 tablet by mouth daily.   metFORMIN 1000 MG tablet Commonly known as: GLUCOPHAGE TAKE ONE TABLET BY MOUTH TWICE A DAY WITH MEALS   multivitamin with minerals Tabs tablet Take 1 tablet by mouth daily.   omeprazole 20 MG capsule Commonly known as: PRILOSEC Take 20 mg by mouth every other day. PRN   Pen Needles 31G X 6 MM Misc Use to inject insulin daily.   tamsulosin 0.4 MG Caps capsule Commonly known as: FLOMAX Take 0.4 mg by mouth. Reported on 05/29/2016   Tyler Aas FlexTouch 200 UNIT/ML FlexTouch Pen Generic drug: insulin degludec INJECT 40 UNITS UNDER THE SKIN DAILY   Trulicity 1.5 JS/2.8BT Sopn Generic drug: Dulaglutide Inject weekly Replaces: Trulicity 5.17 OH/6.0VP Sopn Started by: Elayne Snare, MD       Allergies: No Known Allergies  Past Medical History:  Diagnosis Date  . CKD (chronic kidney disease), stage III   . Depression   . Diabetes mellitus without complication ([place])   . ED (erectile dysfunction)   . GERD (gastroesophageal reflux disease)   . Hyperlipidemia   . Hypertension   . Plantar fasciitis   . Prostatitis     Past Surgical History:  Procedure Laterality Date  . CIRCUMCISION    . COLONOSCOPY    . MOUTH SURGERY  01/2020    Family History  Problem Relation Age of Onset  . Hepatitis C Mother   . Mesothelioma Father   . Diabetes Father   . Heart disease Father     Social History:  reports that he quit smoking about 11 years ago. He has never used smokeless tobacco. He reports current alcohol use. He reports that he does not use drugs.    Review of Systems     RENAL dysfunction: Creatinine is stable, previously  higher  Lab Results  Component Value Date   CREATININE 1.22 05/23/2020   CREATININE 1.19 04/07/2020   CREATININE 1.18 10/26/2019    Lipid history: Has been on Lipitor management  Labs show LDL 96 in 12/20 Now 84 but triglycerides are higher  Also tends to have higher triglycerides   Lab Results  Component Value Date   CHOL 129 12/24/2016   HDL 23.70 (L) 12/24/2016   LDLCALC 97 02/02/2016   LDLDIRECT 84.0 04/07/2020   TRIG 210.0 (H) 12/24/2016   CHOLHDL 5 12/24/2016           Hypertension:Long-standing, treated with Hyzaar, amlodipine 5 mg daily, followed by PCP   Does monitor periodically at home, readings as below: 130/80s  Also has microalbuminuria  BP Readings from Last 3 Encounters:  05/25/20 128/82  04/07/20 122/86  01/26/20 (!) 157/95    Review of Systems   Most recent eye exam was in 6/20  Most recent foot exam: 05/2020    Physical Examination:  BP 128/82 (BP Location: Left Arm, Patient Position: Sitting, Cuff Size: Normal)   Pulse 77   Ht 5\' 9"  (1.753 m)   Wt 190 lb 6.4 oz (86.4 kg)   SpO2 94%   BMI 28.12 kg/m   Diabetic Foot Exam - Simple   Simple Foot Form Diabetic Foot exam was performed with the following findings: Yes   Visual Inspection No deformities, no ulcerations, no other skin  breakdown bilaterally: Yes Sensation Testing Intact to touch and monofilament testing bilaterally: Yes Pulse Check Posterior Tibialis and Dorsalis pulse intact bilaterally: Yes Comments      ASSESSMENT:  Diabetes type 2, on insulin  See history of present illness for  discussion of current diabetes management, blood sugar patterns and problems identified  His A1c is higher at 7.7 compared to 6.6  He appears to be getting more insulin deficient especially with postprandial hyperglycemia as discussed in detail above No also his diet has been suboptimal with more carbohydrates and portions along with weight gain He does do some exercise Since he  did not have his meter not clear how often he is checking readings after meals  Recommendations as below   HYPERTENSION: His blood pressure is followed by PCP Also has increased microalbumin   PLAN:     He will likely benefit from 1.5 mg Trulicity  Start taking insulin with every meal including breakfast starting with 6 units  Go up to 12 units at dinnertime on the regular insulin  He will also check to see if the generic Nov[facility]Log is available at [facility] at this time which will be better than regular insulin  Since fasting readings are not consistently high we will not change [place] as yet  He will follow up in 2 months to review blood sugar patterns again  Needs to cut back on high any high fat and high carbohydrate meals  Consultation with dietitian, likely needs to have his wife go with him  Exercise at least 3 days a week  Will recommend that his PCP change his losartan to Benicar for better efficacy with his proteinuria  Patient Instructions  6 Units R insulin 8 at lunch and 12 at dinner  Check blood sugars on waking up days a week  Also check blood sugars about 2 hours after meals and do this after different meals by rotation  Recommended blood sugar levels on waking up are 90-130 and about 2 hours after meal is 130-180  Please bring your blood sugar monitor to each visit, thank you      Elayne Snare 05/25/2020, 12:31 PM   Note: This office note was prepared with Dragon voice recognition system technology. Any transcriptional errors that result from this process are unintentional.

## 2020-06-12 ENCOUNTER — Encounter: Payer: Self-pay | Admitting: Diagnostic Neuroimaging

## 2020-06-12 ENCOUNTER — Other Ambulatory Visit: Payer: Self-pay

## 2020-06-12 ENCOUNTER — Ambulatory Visit: Payer: Medicare HMO | Admitting: Diagnostic Neuroimaging

## 2020-06-12 VITALS — BP 149/83 | HR 82 | Ht 69.5 in | Wt 190.0 lb

## 2020-06-12 DIAGNOSIS — R413 Other amnesia: Secondary | ICD-10-CM

## 2020-06-12 DIAGNOSIS — R269 Unspecified abnormalities of gait and mobility: Secondary | ICD-10-CM

## 2020-06-12 DIAGNOSIS — F329 Major depressive disorder, single episode, unspecified: Secondary | ICD-10-CM | POA: Diagnosis not present

## 2020-06-12 DIAGNOSIS — F32A Depression, unspecified: Secondary | ICD-10-CM

## 2020-06-12 NOTE — Patient Instructions (Signed)
MEMORY LOSS / BEHAVIOR / PERSONALITY changes (likely related to depression, relational issues) - consider depression treatments per PCP; continue marital counseling  MILD GAIT DIFFICULTY (some mild diabetic neuropathy) - improving; doing well with exercises at the [facility]

## 2020-06-12 NOTE — Progress Notes (Signed)
[facility]  PATIENT: Joel Webb DOB: 01/15/1946  REFERRING CLINICIAN: Antony Contras, MD HISTORY FROM: patient  REASON FOR VISIT: follow up   HISTORICAL  CHIEF COMPLAINT:  Chief Complaint  Patient presents with   Gait Problem    rm 6 FU per PCP, "didn't do PT, I forgot"    HISTORY OF PRESENT ILLNESS:   UPDATE (06/12/20, VRP): Since last visit, doing well. Symptoms are improved. Severity is mild. No alleviating or aggravating factors. Tolerating meds. Memory and gait improved. Now exercising at [facility] 4x per week and feeling stronger.   UPDATE (01/26/20, VRP): Since last visit, doing about the same. Symptoms are persistent. Severity is moderate per patient. No alleviating or aggravating factors. Wife still notes memory and personality changes.  PRIOR HPI (12/22/19): 74 year old male here for evaluation of gait and balance difficulty.  History of diabetes, hypercholesterolemia, high blood pressure.  For past 6 to 8 months patient has had sensation of stumbling, tripping, numbness in feet, left foot incoordination.  Symptoms worsening over time.  He has more difficulty with steps and uneven surfaces.  He notices problem in his left foot.  Has had some back pain in the past.  Wife also notes at least 1 year of memory loss, personality and mood changes, irritable, paranoid, forgetting anniversary dates, forgetting medications and appointments, leaving stove on.  Patient and wife had COVID in Jan 2021.    REVIEW OF SYSTEMS: Full 14 system review of systems performed and negative with exception of: As per HPI.  ALLERGIES: No Known Allergies  HOME MEDICATIONS: Outpatient Medications Prior to Visit  Medication Sig Dispense Refill   amLODipine (NORVASC) 5 MG tablet      atorvastatin (LIPITOR) 80 MG tablet Take 80 mg by mouth daily.     BD INSULIN SYRINGE U/F 31G X 5/16" 1 ML MISC USE TO GIVE 2 INSULIN INJECTIONS DAILY 200 each 1   Dulaglutide (TRULICITY)  1.5 OF/7.5ZW SOPN Inject weekly 4 pen 2   DULoxetine (CYMBALTA) 60 MG capsule 60 mg daily.     finasteride (PROSCAR) 5 MG tablet      glucose blood (ACCU-CHEK SMARTVIEW) test strip Use to check blood sugar 1 time per day. 50 each 3   Insulin Pen Needle (PEN NEEDLES) 31G X 6 MM MISC Use to inject insulin daily. 100 each 2   insulin regular (NOVOLIN R RELION) 100 units/mL injection Inject 0.08 mLs (8 Units total) into the skin 2 (two) times daily before a meal. Inject 10 units under the skin once daily at supper (Patient taking differently: Inject 8 Units into the skin 2 (two) times daily before a meal. Patient injecting 8 units before lunch and 10 units before supper) 10 mL 3   losartan-hydrochlorothiazide (HYZAAR) 100-12.5 MG per tablet Take 1 tablet by mouth daily.     metFORMIN (GLUCOPHAGE) 1000 MG tablet TAKE ONE TABLET BY MOUTH TWICE A DAY WITH MEALS 180 tablet 0   Multiple Vitamin (MULTIVITAMIN WITH MINERALS) TABS tablet Take 1 tablet by mouth daily.     Omega-3 Fatty Acids (FISH OIL) 1000 MG CAPS Take by mouth.     omeprazole (PRILOSEC) 20 MG capsule Take 20 mg by mouth every other day. PRN     tamsulosin (FLOMAX) 0.4 MG CAPS capsule Take 0.4 mg by mouth. Reported on 05/29/2016     TRESIBA FLEXTOUCH 200 UNIT/ML SOPN INJECT 40 UNITS UNDER THE SKIN DAILY 15 mL 1   oxybutynin (DITROPAN-XL) 5 MG 24 hr tablet 06/12/20  not started (Patient not taking: Reported on 06/12/2020)     No facility-administered medications prior to visit.    PAST MEDICAL HISTORY: Past Medical History:  Diagnosis Date   CKD (chronic kidney disease), stage III    Depression    Diabetes mellitus without complication (HCC)    ED (erectile dysfunction)    GERD (gastroesophageal reflux disease)    Hyperlipidemia    Hypertension    Plantar fasciitis    Prostatitis     PAST SURGICAL HISTORY: Past Surgical History:  Procedure Laterality Date   CIRCUMCISION     COLONOSCOPY     MOUTH SURGERY   01/2020    FAMILY HISTORY: Family History  Problem Relation Age of Onset   Hepatitis C Mother    Mesothelioma Father    Diabetes Father    Heart disease Father     SOCIAL HISTORY: Social History   Socioeconomic History   Marital status: Married    Spouse name: [name]   Number of children: 1   Years of education: Not on file   Highest education level: Bachelor's degree (e.g., BA, AB, BS)  Occupational History    Comment: sales rep, retired  Tobacco Use   Smoking status: Former Smoker    Quit date: 12/20/2008    Years since quitting: 11.4   Smokeless tobacco: Never Used  Substance and Sexual Activity   Alcohol use: Yes    Comment: rarely   Drug use: No   Sexual activity: Not on file  Other Topics Concern   Not on file  Social History Narrative   01/26/20 Lives with wife   Caffeine, none   Social Determinants of Health   Financial Resource Strain:    Difficulty of Paying Living Expenses:   Food Insecurity:    Worried About Charity fundraiser in the Last Year:    Arboriculturist in the Last Year:   Transportation Needs:    Film/video editor (Medical):    Lack of Transportation (Non-Medical):   Physical Activity:    Days of Exercise per Week:    Minutes of Exercise per Session:   Stress:    Feeling of Stress :   Social Connections:    Frequency of Communication with Friends and Family:    Frequency of Social Gatherings with Friends and Family:    Attends Religious Services:    Active Member of Clubs or Organizations:    Attends Music therapist:    Marital Status:   Intimate Partner Violence:    Fear of Current or Ex-Partner:    Emotionally Abused:    Physically Abused:    Sexually Abused:      PHYSICAL EXAM  GENERAL EXAM/CONSTITUTIONAL: Vitals:  Vitals:   06/12/20 1354  BP: (!) 149/83  Pulse: 82  Weight: 190 lb (86.2 kg)  Height: 5' 9.5" (1.765 m)   Body mass index is 27.66 kg/m. Wt Readings  from Last 3 Encounters:  06/12/20 190 lb (86.2 kg)  05/25/20 190 lb 6.4 oz (86.4 kg)  04/07/20 185 lb 6.4 oz (84.1 kg)    Patient is in no distress; well developed, nourished and groomed; neck is supple  CARDIOVASCULAR:  Examination of carotid arteries is normal; no carotid bruits  Regular rate and rhythm, no murmurs  Examination of peripheral vascular system by observation and palpation is normal  EYES:  Ophthalmoscopic exam of optic discs and posterior segments is normal; no papilledema or hemorrhages No exam data present  MUSCULOSKELETAL:  Gait, strength, tone, movements noted in Neurologic exam below  NEUROLOGIC: MENTAL STATUS:  MMSE - Mini Mental State Exam 01/26/2020  Orientation to time 4  Orientation to Place 4  Registration 3  Attention/ Calculation 5  Recall 3  Language- name 2 objects 2  Language- repeat 1  Language- follow 3 step command 3  Language- read & follow direction 1  Write a sentence 1  Copy design 1  Total score 28    awake, alert, oriented to person, place and time  recent and remote memory intact  normal attention and concentration  language fluent, comprehension intact, naming intact  fund of knowledge appropriate  CRANIAL NERVE:   2nd - no papilledema on fundoscopic exam  2nd, 3rd, 4th, 6th - pupils equal and reactive to light, visual fields full to confrontation, extraocular muscles intact, no nystagmus  5th - facial sensation symmetric  7th - facial strength symmetric  8th - hearing intact  9th - palate elevates symmetrically, uvula midline  11th - shoulder shrug symmetric  12th - tongue protrusion midline  MOTOR:   normal bulk and tone, full strength in the BUE, BLE  SENSORY:   normal and symmetric to light touch, temperature, vibration  COORDINATION:   finger-nose-finger, fine finger movements SLIGHTLY SLOW IN BUE; DECR IN LEFT FOOT TAPPING SPEED  REFLEXES:   deep tendon reflexes BRISK (3+ IN BUE; 2+ IN  BLE) and symmetric; POSITIVE HOFFMANS  GAIT/STATION:   narrow based gait     DIAGNOSTIC DATA (LABS, IMAGING, TESTING) - I reviewed patient records, labs, notes, testing and imaging myself where available.  Lab Results  Component Value Date   WBC 6.8 02/02/2016      Component Value Date/Time   NA 139 05/23/2020 0802   NA 137 02/02/2016 0000   K 4.5 05/23/2020 0802   CL 104 05/23/2020 0802   CO2 27 05/23/2020 0802   GLUCOSE 180 (H) 05/23/2020 0802   BUN 25 (H) 05/23/2020 0802   BUN 33 (A) 02/02/2016 0000   CREATININE 1.22 05/23/2020 0802   CALCIUM 9.2 05/23/2020 0802   PROT 6.3 05/23/2020 0802   ALBUMIN 3.9 05/23/2020 0802   AST 23 05/23/2020 0802   ALT 27 05/23/2020 0802   ALKPHOS 85 05/23/2020 0802   BILITOT 0.4 05/23/2020 0802   Lab Results  Component Value Date   CHOL 129 12/24/2016   HDL 23.70 (L) 12/24/2016   LDLCALC 97 02/02/2016   LDLDIRECT 84.0 04/07/2020   TRIG 210.0 (H) 12/24/2016   CHOLHDL 5 12/24/2016   Lab Results  Component Value Date   HGBA1C 7.7 (H) 05/23/2020   Lab Results  Component Value Date   VITAMINB12 541 12/22/2019   Lab Results  Component Value Date   TSH 1.71 02/02/2016    01/12/20 MRI brain [I reviewed images myself and agree with interpretation. -VRP]  - Mild atrophy and mild chronic small vessel ischemic disease. - No acute findings.  01/12/20 MRI cervical spine [I reviewed images myself and agree with interpretation. -VRP]  - At C3-4: disc bulging and facet hypertrophy with mild spinal stenosis and severe biforaminal stenosis. - At C2-3: disc bulging and facet hypertrophy with moderate biforaminal stenosis.    ASSESSMENT AND PLAN  74 y.o. year old adult here with mild gait and balance difficulty, incoordination, memory loss, personality mood changes.  Doing better with counseling and exercises  Dx:  1. Gait difficulty   2. Memory loss   3. Depressive disorder  PLAN:  MEMORY LOSS / BEHAVIOR / PERSONALITY  changes (likely related to depression, relational issues) - consider depression treatments per PCP; continue marital counseling  MILD GAIT DIFFICULTY (some mild diabetic neuropathy) - improving; doing well with exercises at the [facility]  Return for pending if symptoms worsen or fail to improve, return to PCP.    Penni Bombard, MD 1/64/2903, 7:95 PM Certified in Neurology, Neurophysiology and Neuroimaging  [facility] [address] [place], [place] [address] [phone]

## 2020-06-29 DIAGNOSIS — R3912 Poor urinary stream: Secondary | ICD-10-CM | POA: Diagnosis not present

## 2020-06-29 DIAGNOSIS — N403 Nodular prostate with lower urinary tract symptoms: Secondary | ICD-10-CM | POA: Diagnosis not present

## 2020-06-29 DIAGNOSIS — R351 Nocturia: Secondary | ICD-10-CM | POA: Diagnosis not present

## 2020-07-10 ENCOUNTER — Encounter: Payer: Self-pay | Admitting: Dietician

## 2020-07-10 ENCOUNTER — Encounter: Payer: Medicare HMO | Attending: Endocrinology | Admitting: Dietician

## 2020-07-10 ENCOUNTER — Other Ambulatory Visit: Payer: Self-pay

## 2020-07-10 DIAGNOSIS — Z794 Long term (current) use of insulin: Secondary | ICD-10-CM | POA: Insufficient documentation

## 2020-07-10 DIAGNOSIS — E1165 Type 2 diabetes mellitus with hyperglycemia: Secondary | ICD-10-CM | POA: Diagnosis not present

## 2020-07-10 NOTE — Progress Notes (Signed)
Diabetes Self-Management Education  Visit Type: First/Initial  Appt. Start Time: 1545 Appt. End Time: 9371  07/10/2020  [name], identified by name and date of birth, is a 74 y.o. adult with a diagnosis of Diabetes: Type 2.   ASSESSMENT  Patient is here today alone.  He was referred for type 2 diabetes and would like to learn more about eating with diabetes. He states that he believes that he is eating too many snacks/sweets. He was last seen by myself in 2017.  History includes Type 2 Diabetes, HTN, HLD. A1C 7.7% 05/23/2020 increased from 6.6% 04/07/2020. Medication includes Trulicity, Metformin, Tresoba 40 units at dinner, Novolin R 6 units before breakfast, 8 units before lunch, 12 units before dinner.  (patient had not increased this per MD recommendations at his 05/2020 appointment as patient stated that he was not aware of the recommended change).  He frequently forgets the am dose. Weight overall stable.  Patient lives with his wife.  He does the shopping and cooking.   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  Height 5\' 9"  (1.753 m), weight 187 lb (84.8 kg). Body mass index is 27.62 kg/m.   Diabetes Self-Management Education - 07/10/20 1556      Visit Information   Visit Type First/Initial      Initial Visit   Diabetes Type Type 2    Are you currently following a meal plan? No    Are you taking your medications as prescribed? Yes    Date Diagnosed 2010      Health Coping   How would you rate your overall health? Good      Psychosocial Assessment   Patient Belief/Attitude about Diabetes Motivated to manage diabetes    Self-care barriers None    Self-management support Doctor's office;Family    Other persons present Patient    Patient Concerns Nutrition/Meal planning    Special Needs None    Preferred Learning Style No preference indicated    Learning Readiness Ready    How often do you need to have someone help you when you read instructions,  pamphlets, or other written materials from your doctor or pharmacy? 1 - Never    What is the last grade level you completed in school? 4 years college      Pre-Education Assessment   Patient understands the diabetes disease and treatment process. Needs Review    Patient understands incorporating nutritional management into lifestyle. Needs Review    Patient undertands incorporating physical activity into lifestyle. Needs Review    Patient understands using medications safely. Needs Review    Patient understands monitoring blood glucose, interpreting and using results Needs Review    Patient understands prevention, detection, and treatment of acute complications. Needs Review    Patient understands prevention, detection, and treatment of chronic complications. Needs Review    Patient understands how to develop strategies to address psychosocial issues. Needs Review    Patient understands how to develop strategies to promote health/change behavior. Needs Review      Complications   Last HgB A1C per patient/outside source 7.7 %   05/2020 increased from 6.6 03/2020   How often do you check your blood sugar? 1-2 times/day    Fasting Blood glucose range (mg/dL) 130-179    Postprandial Blood glucose range (mg/dL) 130-179    Number of hypoglycemic episodes per month 0    Number of hyperglycemic episodes per week 0    Have you had a dilated eye exam  in the past 12 months? Yes    Have you had a dental exam in the past 12 months? Yes    Are you checking your feet? Yes    How many days per week are you checking your feet? 7      Dietary Intake   Breakfast coffee with creamer, pastry OR eggs or sausage, occasional toast, occasional fruit OR skips when he goes to the [employer] early   6-7   Snack (morning) ham sandwich or yogurt if he went to the [facility] early   1030   Lunch salad with ham   12-1 or later if late breakfast   Snack (afternoon) occasional goldfish or other crackers    Dinner air fried or  instant pot chicken or beef or pork, vegetables, potatoes or rice    Snack (evening) occasional popcorn or chocolate covered almonds (1-2)    Beverage(s) coffee with creamer, decaf iced tea with sugar sub, caffeine free diet coke, occasional milk      Exercise   Exercise Type Light (walking / raking leaves)   walks 20 minutes and machines 10-15 minutes at [facility]   How many days per week to you exercise? 5    How many minutes per day do you exercise? 30    Total minutes per week of exercise 150      Patient Education   Previous Diabetes Education Yes (please comment)   [place] 2017 and Linda 2018   Disease state  Definition of diabetes, type 1 and 2, and the diagnosis of diabetes    Nutrition management  Role of diet in the treatment of diabetes and the relationship between the three main macronutrients and blood glucose level;Meal options for control of blood glucose level and chronic complications.;Food label reading, portion sizes and measuring food.    Physical activity and exercise  Role of exercise on diabetes management, blood pressure control and cardiac health.    Medications Reviewed patients medication for diabetes, action, purpose, timing of dose and side effects.;Taught/reviewed insulin injection, site rotation, insulin storage and needle disposal.    Monitoring Identified appropriate SMBG and/or A1C goals.;Purpose and frequency of SMBG.    Acute complications Taught treatment of hypoglycemia - the 15 rule.;Discussed and identified patients' treatment of hyperglycemia.    Chronic complications Relationship between chronic complications and blood glucose control;Reviewed with patient heart disease, higher risk of, and prevention;Nephropathy, what it is, prevention of, the use of ACE, ARB's and early detection of through urine microalbumia.;Retinopathy and reason for yearly dilated eye exams;Assessed and discussed foot care and prevention of foot problems;Identified and discussed with patient   current chronic complications    Psychosocial adjustment Identified and addressed patients feelings and concerns about diabetes      Individualized Goals (developed by patient)   Nutrition General guidelines for healthy choices and portions discussed    Physical Activity Exercise 5-7 days per week;30 minutes per day    Medications take my medication as prescribed    Monitoring  test my blood glucose as discussed    Reducing Risk examine blood glucose patterns;increase portions of healthy fats;do foot checks daily    Health Coping discuss diabetes with (comment)   MD, RD, CDCES     Post-Education Assessment   Patient understands the diabetes disease and treatment process. Demonstrates understanding / competency    Patient understands incorporating nutritional management into lifestyle. Needs Review    Patient undertands incorporating physical activity into lifestyle. Demonstrates understanding / competency  Patient understands using medications safely. Demonstrates understanding / competency    Patient understands monitoring blood glucose, interpreting and using results Demonstrates understanding / competency    Patient understands prevention, detection, and treatment of acute complications. Demonstrates understanding / competency    Patient understands prevention, detection, and treatment of chronic complications. Demonstrates understanding / competency    Patient understands how to develop strategies to address psychosocial issues. Demonstrates understanding / competency    Patient understands how to develop strategies to promote health/change behavior. Needs Review      Outcomes   Expected Outcomes Demonstrated interest in learning. Expect positive outcomes    Future DMSE 3-4 months    Program Status Not Completed           Individualized Plan for Diabetes Self-Management Training:   Learning Objective:  Patient will have a greater understanding of diabetes  self-management. Patient education plan is to attend individual and/or group sessions per assessed needs and concerns.   Plan:   Patient Instructions  At your last appointment with Dr. Stevie Kern, his insulin recommendations were:  Novolin R 6 units before Breakfast, 8 units before lunch, and 12 units before dinner.  Tresiba 40 units at dinner  Consistent meals Low fat Balance meals with small amount of protein Consider smaller snacks, focus on things without carbohydrate Increase your vegetable intake. Continue to stay active.  Aim for 4 Carb Choices per meal (60 grams) +/- 1 either way  Aim for 0-1 Carbs per snack if hungry  Include protein in moderation with your meals and snacks Consider reading food labels for Total Carbohydrate of foods Continue checking BG at alternate times per day  Consider taking medication as directed by MD      Expected Outcomes:  Demonstrated interest in learning. Expect positive outcomes  Education material provided: ADA - How to Thrive: A Guide for Your Journey with Diabetes, Meal plan card and Snack sheet  If problems or questions, patient to contact team via:  Phone  Future DSME appointment: 3-4 months

## 2020-07-10 NOTE — Patient Instructions (Signed)
At your last appointment with Dr. Stevie Kern, his insulin recommendations were:  Novolin R 6 units before Breakfast, 8 units before lunch, and 12 units before dinner.  Tresiba 40 units at dinner  Consistent meals Low fat Balance meals with small amount of protein Consider smaller snacks, focus on things without carbohydrate Increase your vegetable intake. Continue to stay active.  Aim for 4 Carb Choices per meal (60 grams) +/- 1 either way  Aim for 0-1 Carbs per snack if hungry  Include protein in moderation with your meals and snacks Consider reading food labels for Total Carbohydrate of foods Continue checking BG at alternate times per day  Consider taking medication as directed by MD

## 2020-07-11 DIAGNOSIS — E119 Type 2 diabetes mellitus without complications: Secondary | ICD-10-CM | POA: Diagnosis not present

## 2020-07-11 DIAGNOSIS — N183 Chronic kidney disease, stage 3 unspecified: Secondary | ICD-10-CM | POA: Diagnosis not present

## 2020-07-11 DIAGNOSIS — N400 Enlarged prostate without lower urinary tract symptoms: Secondary | ICD-10-CM | POA: Diagnosis not present

## 2020-07-11 DIAGNOSIS — E782 Mixed hyperlipidemia: Secondary | ICD-10-CM | POA: Diagnosis not present

## 2020-07-11 DIAGNOSIS — E1122 Type 2 diabetes mellitus with diabetic chronic kidney disease: Secondary | ICD-10-CM | POA: Diagnosis not present

## 2020-07-11 DIAGNOSIS — I10 Essential (primary) hypertension: Secondary | ICD-10-CM | POA: Diagnosis not present

## 2020-07-11 DIAGNOSIS — F339 Major depressive disorder, recurrent, unspecified: Secondary | ICD-10-CM | POA: Diagnosis not present

## 2020-07-11 DIAGNOSIS — N402 Nodular prostate without lower urinary tract symptoms: Secondary | ICD-10-CM | POA: Diagnosis not present

## 2020-07-11 DIAGNOSIS — F329 Major depressive disorder, single episode, unspecified: Secondary | ICD-10-CM | POA: Diagnosis not present

## 2020-07-18 ENCOUNTER — Telehealth: Payer: Self-pay | Admitting: Diagnostic Neuroimaging

## 2020-07-18 NOTE — Telephone Encounter (Signed)
[facility] wife(on DPR) has called asking RN calls her to discuss what took place on office visit in July, wife states pt did not give her the details of the appointment and she has concerns.  Please call.

## 2020-07-18 NOTE — Telephone Encounter (Addendum)
Called wife [name] on [place] who stated he has fallen once since July, is leaning toward left due to loss of peripheral vision. She stated he has appointment with eye dr to have this and a cyst on his left eye checked. She asked about memory medications. I advised her that they can discuss with PCP , call us if he needs a FU here. She stated they continue with marital counseling. She had no further questions,  verbalized understanding, appreciation.

## 2020-07-20 DIAGNOSIS — D23122 Other benign neoplasm of skin of left lower eyelid, including canthus: Secondary | ICD-10-CM | POA: Diagnosis not present

## 2020-07-21 ENCOUNTER — Other Ambulatory Visit: Payer: Medicare HMO

## 2020-07-21 ENCOUNTER — Other Ambulatory Visit: Payer: Self-pay | Admitting: Critical Care Medicine

## 2020-07-21 DIAGNOSIS — Z20822 Contact with and (suspected) exposure to covid-19: Secondary | ICD-10-CM

## 2020-07-22 LAB — NOVEL CORONAVIRUS, NAA: SARS-CoV-2, NAA: NOT DETECTED

## 2020-07-31 ENCOUNTER — Other Ambulatory Visit: Payer: Medicare HMO

## 2020-08-03 ENCOUNTER — Ambulatory Visit: Payer: Medicare HMO | Admitting: Endocrinology

## 2020-08-08 ENCOUNTER — Other Ambulatory Visit (INDEPENDENT_AMBULATORY_CARE_PROVIDER_SITE_OTHER): Payer: Medicare HMO

## 2020-08-08 ENCOUNTER — Other Ambulatory Visit: Payer: Self-pay

## 2020-08-08 DIAGNOSIS — Z794 Long term (current) use of insulin: Secondary | ICD-10-CM | POA: Diagnosis not present

## 2020-08-08 DIAGNOSIS — E1165 Type 2 diabetes mellitus with hyperglycemia: Secondary | ICD-10-CM

## 2020-08-08 LAB — GLUCOSE, RANDOM: Glucose, Bld: 152 mg/dL — ABNORMAL HIGH (ref 70–99)

## 2020-08-09 LAB — FRUCTOSAMINE: Fructosamine: 248 umol/L (ref 0–285)

## 2020-08-11 ENCOUNTER — Other Ambulatory Visit: Payer: Self-pay

## 2020-08-11 ENCOUNTER — Encounter: Payer: Self-pay | Admitting: Endocrinology

## 2020-08-11 ENCOUNTER — Ambulatory Visit (INDEPENDENT_AMBULATORY_CARE_PROVIDER_SITE_OTHER): Payer: Medicare HMO | Admitting: Endocrinology

## 2020-08-11 VITALS — BP 126/84 | HR 76 | Ht 69.0 in | Wt 189.0 lb

## 2020-08-11 DIAGNOSIS — Z794 Long term (current) use of insulin: Secondary | ICD-10-CM | POA: Diagnosis not present

## 2020-08-11 DIAGNOSIS — E1165 Type 2 diabetes mellitus with hyperglycemia: Secondary | ICD-10-CM | POA: Diagnosis not present

## 2020-08-11 NOTE — Patient Instructions (Addendum)
Check blood sugars on waking up 2-3 days a week  Also check blood sugars about 2 hours after meals and do this after different meals by rotation  Recommended blood sugar levels on waking up are 90-130 and about 2 hours after meal is 130-160  Please bring your blood sugar monitor to each visit, thank you  Reduce Tresiba to 40  if am sugars stay <90  Take 6 R insulin at lunch when out for golf

## 2020-08-11 NOTE — Progress Notes (Signed)
Patient ID: Joel Webb, male   DOB: 1946-06-18, 74 y.o.   MRN: [id number]           Reason for Appointment: Follow-up for Type 2 Diabetes and related problems    Referring physician: Dr. Moreen Fowler  History of Present Illness:          Date of diagnosis of type 2 diabetes mellitus: 2007       Background history:   He is unclear when he was diagnosed to have diabetes but was not very symptomatic He had been on metformin alone for several years with reportedly good control Also had previously been very consistent with exercise and diet His blood sugars probably started going up 3-4 years ago and he was given Lantus insulin in addition He was seen in consultation in 4/17 with an A1c of 8.4 and he was started Trulicity in addition to his Lantus and metformin This was stopped in 7/17 because of high out-of-pocket expense  The V-go pump was stopped because he found it too uncomfortable and not sticking well  Recent history:   INSULIN regimen is: [place] 42 units in p.m., Novolin R 8 at breakfast and at lunchtime--12 units at supper  Non-insulin hypoglycemic drugs the patient is taking are: metformin 1 g twice a day, Trulicity 1.5 mg weekly   Current blood sugar patterns and problems identified:   His A1c in July was higher than usual at 7.7, previously was at 6.6 Fructosamine is improved at 248   He did bring his monitor for download of monitor did not have readings on the last visit and he has difficulty remembering his numbers  Since he had gained weight and had higher readings his Trulicity was increased  Although his weight has stayed about the same his blood sugars appear to be better  On his own he has increased all his insulin doses by 2 units and not clear why  Has checked blood sugars only on 5 days in the last month  He may tend to have higher readings after lunch  This may be from either taking his insulin late or getting more carbohydrates  He is trying to  exercise in the mornings or midday and going 4 to 5 days a week now  His FASTING blood sugars are looking fairly good but only 2 readings recently  Has not checked any readings after dinner  Side effects from medications have been: None Exercise: Walking 15 to 20 minutes usually  Compliance with the medical regimen: fair   Hypoglycemia: none    Glucose monitoring:  done less than 1 times a day         Glucometer:  Accu-Chek Blood Glucose readings by recall:   PRE-MEAL Fasting Lunch Dinner Bedtime Overall  Glucose range:  108-131  113, 130     Mean/median:  118     144   POST-MEAL PC Breakfast PC Lunch PC Dinner  Glucose range:   177-230   Mean/median:        Self-care:  Usually tries to limit High-fat foods and portions                 Dietician visit, most recent:6/17                Weight history:  Wt Readings from Last 3 Encounters:  08/11/20 189 lb (85.7 kg)  07/10/20 187 lb (84.8 kg)  06/12/20 190 lb (86.2 kg)    Glycemic control:   Lab Results  Component Value Date   HGBA1C 7.7 (H) 05/23/2020   HGBA1C 6.6 (A) 04/07/2020   HGBA1C 6.6 (H) 10/26/2019   Lab Results  Component Value Date   MICROALBUR 58.1 (H) 05/23/2020   LDLCALC 97 02/02/2016   CREATININE 1.22 05/23/2020   Lab Results  Component Value Date   MICRALBCREAT 69.6 (H) 05/23/2020    Lab Results  Component Value Date   FRUCTOSAMINE 248 08/08/2020   FRUCTOSAMINE 266 06/02/2017   FRUCTOSAMINE 233 02/04/2017     Other problems addressed today: See review of systems   Lab on 08/08/2020  Component Date Value Ref Range Status  . Fructosamine 08/08/2020 248  0 - 285 umol/L Final   Comment: Published reference interval for apparently healthy subjects between age 43 and 73 is 60 - 285 umol/L and in a poorly controlled diabetic population is 228 - 563 umol/L with a mean of 396 umol/L.   Marland Kitchen Glucose, Bld 08/08/2020 152* 70 - 99 mg/dL Final      Allergies as of 08/11/2020   No Known  Allergies     Medication List       Accurate as of August 11, 2020  3:23 PM. If you have any questions, ask your nurse or doctor.        Accu-Chek SmartView test strip Generic drug: glucose blood Use to check blood sugar 1 time per day.   amLODipine 5 MG tablet Commonly known as: NORVASC   atorvastatin 80 MG tablet Commonly known as: LIPITOR Take 80 mg by mouth daily.   BD Insulin Syringe U/F 31G X 5/16" 1 ML Misc Generic drug: Insulin Syringe-Needle U-100 USE TO GIVE 2 INSULIN INJECTIONS DAILY   DULoxetine 60 MG capsule Commonly known as: CYMBALTA 60 mg daily.   finasteride 5 MG tablet Commonly known as: PROSCAR   Fish Oil 1000 MG Caps Take by mouth.   insulin regular 100 units/mL injection Commonly known as: NovoLIN R ReliOn Inject 0.08 mLs (8 Units total) into the skin 2 (two) times daily before a meal. Inject 10 units under the skin once daily at supper What changed: additional instructions   losartan-hydrochlorothiazide 100-12.5 MG tablet Commonly known as: HYZAAR Take 1 tablet by mouth daily.   metFORMIN 1000 MG tablet Commonly known as: GLUCOPHAGE TAKE ONE TABLET BY MOUTH TWICE A DAY WITH MEALS   multivitamin with minerals Tabs tablet Take 1 tablet by mouth daily.   omeprazole 20 MG capsule Commonly known as: PRILOSEC Take 20 mg by mouth every other day. PRN   oxybutynin 5 MG 24 hr tablet Commonly known as: DITROPAN-XL 06/12/20 not started   Pen Needles 31G X 6 MM Misc Use to inject insulin daily.   tamsulosin 0.4 MG Caps capsule Commonly known as: FLOMAX Take 0.4 mg by mouth. Reported on 05/29/2016   Tyler Aas FlexTouch 200 UNIT/ML FlexTouch Pen Generic drug: insulin degludec INJECT 40 UNITS UNDER THE SKIN DAILY   Trulicity 1.5 WE/3.1VQ Sopn Generic drug: Dulaglutide Inject weekly       Allergies: No Known Allergies  Past Medical History:  Diagnosis Date  . CKD (chronic kidney disease), stage III   . Depression   . Diabetes  mellitus without complication ([place])   . ED (erectile dysfunction)   . GERD (gastroesophageal reflux disease)   . Hyperlipidemia   . Hypertension   . Plantar fasciitis   . Prostatitis     Past Surgical History:  Procedure Laterality Date  . CIRCUMCISION    . COLONOSCOPY    .  MOUTH SURGERY  01/2020    Family History  Problem Relation Age of Onset  . Hepatitis C Mother   . Mesothelioma Father   . Diabetes Father   . Heart disease Father     Social History:  reports that he quit smoking about 11 years ago. He has never used smokeless tobacco. He reports current alcohol use. He reports that he does not use drugs.    Review of Systems     RENAL dysfunction: Creatinine is stable, previously higher  Lab Results  Component Value Date   CREATININE 1.22 05/23/2020   CREATININE 1.19 04/07/2020   CREATININE 1.18 10/26/2019    Lipid history: Has been on Lipitor for cholesterol management  Labs show LDL 96 in 12/20 Now 84 but triglycerides are higher  Also tends to have higher triglycerides   Lab Results  Component Value Date   CHOL 129 12/24/2016   HDL 23.70 (L) 12/24/2016   LDLCALC 97 02/02/2016   LDLDIRECT 84.0 04/07/2020   TRIG 210.0 (H) 12/24/2016   CHOLHDL 5 12/24/2016           Hypertension:Long-standing, treated with Hyzaar, amlodipine 5 mg daily, followed by PCP   Does monitor periodically at home  Also has microalbuminuria  BP Readings from Last 3 Encounters:  08/11/20 126/84  06/12/20 (!) 149/83  05/25/20 128/82    Review of Systems   Most recent eye exam was in 6/20  Most recent foot exam: 05/2020    Physical Examination:  BP 126/84   Pulse 76   Ht 5\' 9"  (1.753 m)   Wt 189 lb (85.7 kg)   SpO2 98%   BMI 27.91 kg/m     ASSESSMENT:  Diabetes type 2, on insulin  See history of present illness for  discussion of current diabetes management, blood sugar patterns and problems identified  His A1c was last 7.7 Now fructosamine is  248  With increasing Trulicity in addition to his basal bolus insulin regimen his blood sugars are looking improved Also has not gained any more weight As before difficult to get a good history from him He has done well with regular exercise also   PLAN:     He does need to check blood sugar more consistently after meals especially at night after dinner  Try to take his insulin 15 to 30 minutes before meals for his Novolin regular insulin  If he is playing golf in the afternoon he should reduce his regular insulin to 6 units instead of 8  Follow-up in 4 months  Patient Instructions  Check blood sugars on waking up 2-3 days a week  Also check blood sugars about 2 hours after meals and do this after different meals by rotation  Recommended blood sugar levels on waking up are 90-130 and about 2 hours after meal is 130-160  Please bring your blood sugar monitor to each visit, thank you  Reduce [place] to 40  if am sugars stay <90  Take 6 R insulin at lunch when out for golf    Elayne Snare 08/11/2020, 3:23 PM   Note: This office note was prepared with Dragon voice recognition system technology. Any transcriptional errors that result from this process are unintentional.

## 2020-08-15 DIAGNOSIS — F341 Dysthymic disorder: Secondary | ICD-10-CM | POA: Diagnosis not present

## 2020-08-22 DIAGNOSIS — F341 Dysthymic disorder: Secondary | ICD-10-CM | POA: Diagnosis not present

## 2020-08-24 ENCOUNTER — Other Ambulatory Visit: Payer: Self-pay | Admitting: Endocrinology

## 2020-08-25 ENCOUNTER — Ambulatory Visit: Payer: Medicare HMO | Attending: Family Medicine | Admitting: Physical Therapy

## 2020-08-25 ENCOUNTER — Other Ambulatory Visit: Payer: Self-pay

## 2020-08-25 ENCOUNTER — Encounter: Payer: Self-pay | Admitting: Physical Therapy

## 2020-08-25 DIAGNOSIS — R293 Abnormal posture: Secondary | ICD-10-CM | POA: Diagnosis not present

## 2020-08-25 DIAGNOSIS — R2681 Unsteadiness on feet: Secondary | ICD-10-CM | POA: Insufficient documentation

## 2020-08-25 DIAGNOSIS — M6281 Muscle weakness (generalized): Secondary | ICD-10-CM | POA: Diagnosis not present

## 2020-08-25 NOTE — Therapy (Signed)
[facility] [address] [place], [place], [address] Phone: [phone]   Fax:  [phone]  Physical Therapy Evaluation  Patient Details  Name: Joel Webb MRN: [id number] Date of Birth: [birth year] Referring Provider (PT): Antony Contras, MD   Encounter Date: 08/25/2020   PT End of Session - 08/25/20 1223    Visit Number 1    Number of Visits 6    Date for PT Re-Evaluation 10/06/20    Authorization Type [employer] -- auth requested 10/8, needs 10th visit progress note    Progress Note Due on Visit 10    PT Start Time 1228    PT Stop Time 1310    PT Time Calculation (min) 42 min    Activity Tolerance Patient tolerated treatment well    Behavior During Therapy [facility] for tasks assessed/performed           Past Medical History:  Diagnosis Date  . CKD (chronic kidney disease), stage III ([place])   . Depression   . Diabetes mellitus without complication ([facility])   . ED (erectile dysfunction)   . GERD (gastroesophageal reflux disease)   . Hyperlipidemia   . Hypertension   . Plantar fasciitis   . Prostatitis     Past Surgical History:  Procedure Laterality Date  . CIRCUMCISION    . COLONOSCOPY    . MOUTH SURGERY  01/2020    There were no vitals filed for this visit.    Subjective Assessment - 08/25/20 1228    Subjective Pt reports he had noticed during the summer more LOBs when he started wearing his sandals. Pt states he's been bumping into things in the home (L arm more bruised than R arm). Pt notes he's been able to do everything he's needed to but just needs to be more careful.    Pertinent History COVID Jan 2021    How long can you sit comfortably? n/a    How long can you stand comfortably? n/a    How long can you walk comfortably? n/a    Diagnostic tests MRI brain (with and without) demonstrating:- Mild atrophy and mild chronic small vessel ischemic disease. - No acute findings.    Patient Stated  Goals Improve balance and not hurt himself    Currently in Pain? No/denies              [facility] PT Assessment - 08/25/20 1223      Assessment   Medical Diagnosis gait/imbalance    Referring Provider (PT) Antony Contras, MD    Onset Date/Surgical Date --   April 2021   Hand Dominance Right    Prior Therapy None      Precautions   Precautions Fall      Restrictions   Weight Bearing Restrictions No      Balance Screen   Has the patient fallen in the past 6 months Yes    How many times? 1   pt thinks he stumbled on his feet on steps   Has the patient had a decrease in activity level because of a fear of falling?  No    Is the patient reluctant to leave their home because of a fear of falling?  No      Home Environment   Living Environment Private residence    Living Arrangements Spouse/significant other    Available Help at Discharge Family    Type of [place] to enter  Entrance Stairs-Number of Steps 5    Entrance Stairs-Rails Cannot reach both;Right;Left    Home Layout Two level    Alternate Level Stairs-Number of Steps 12    Alternate Level Stairs-Rails Left   when going up   Home Equipment None    Additional Comments Goes down to basement level to let dogs out      Prior Function   Level of Independence Independent    Vocation Retired    Leisure Active outdoors, golfing, tennis, fishing      Observation/Other Assessments   Focus on Therapeutic Outcomes (FOTO)  n/a      Sensation   Light Touch Appears Intact      Functional Tests   Functional tests Squat;Single leg stance;Sit to Stand      Sit to Stand   Comments 5x STS 24.47 sec      Posture/Postural Control   Posture/Postural Control Postural limitations    Postural Limitations Rounded Shoulders;Forward head;Increased thoracic kyphosis      Strength   Overall Strength Comments Grossly 5/5 in bilat hip and knee    Right/Left Ankle Right;Left    Right Ankle Dorsiflexion 4/5     Right Ankle Plantar Flexion 4+/5    Right Ankle Inversion 5/5    Right Ankle Eversion 4/5    Left Ankle Dorsiflexion 4/5    Left Ankle Plantar Flexion 4+/5    Left Ankle Inversion 5/5    Left Ankle Eversion 4/5      Transfers   Sit to Stand 5: Supervision;Without upper extremity assist    Sit to Stand Details Tactile cues for weight shifting   weightshifts towards R to stand   Five time sit to stand comments  24.47 sec    Stand to Sit 7: Independent;Without upper extremity assist      Ambulation/Gait   Ambulation/Gait Yes    Ambulation Distance (Feet) 115 Feet    Gait Pattern Step-through pattern;Trunk flexed   Fast gait   Gait velocity 8.53 sec = 3.85 feet/sec    Stairs Yes    Stairs Assistance 5: Supervision    Stair Management Technique Alternating pattern;Other (comment)   slow with descent; increased R lateral lean   Number of Stairs 4    Height of Stairs 6      Standardized Balance Assessment   Standardized Balance Assessment Berg Balance Test;Dynamic Gait Index;Timed Up and Go Test    10 Meter Walk --      Berg Balance Test   Sit to Stand Able to stand without using hands and stabilize independently    Standing Unsupported Able to stand safely 2 minutes    Sitting with Back Unsupported but Feet Supported on Floor or Stool Able to sit safely and securely 2 minutes    Stand to Sit Controls descent by using hands    Transfers Able to transfer safely, definite need of hands    Standing Unsupported with Eyes Closed Able to stand 10 seconds with supervision    Standing Unsupported with Feet Together Able to place feet together independently and stand for 1 minute with supervision    From Standing, Reach Forward with Outstretched Arm Reaches forward but needs supervision   Reaches forward 1"   From Standing Position, Pick up Object from Floor Able to pick up shoe safely and easily    From Standing Position, Turn to Look Behind Over each Shoulder Looks behind from both sides and  weight shifts well    Turn 360  Degrees Able to turn 360 degrees safely one side only in 4 seconds or less   LOB going to left   Standing Unsupported, Alternately Place Feet on Step/Stool Able to complete >2 steps/needs minimal assist   Able to perform but with 1 minor LOB requiring min A   Standing Unsupported, One Foot in Front Able to plae foot ahead of the other independently and hold 30 seconds   Less sway with R foot back   Standing on One Leg Tries to lift leg/unable to hold 3 seconds but remains standing independently    Total Score 41      Dynamic Gait Index   Level Surface Normal    Change in Gait Speed Mild Impairment    Gait with Horizontal Head Turns Normal    Gait with Vertical Head Turns Normal    Gait and Pivot Turn Normal    Step Over Obstacle Normal    Step Around Obstacles Mild Impairment    Steps Mild Impairment   Slow, used rail on ascent; slow on descent   Total Score 21    DGI comment: 21/24      Timed Up and Go Test   Normal TUG (seconds) 9.87      High Level Balance   High Level Balance Comments On foam feet apart EO: 30 sec, on foam feet apart EC: 26 sec                      Objective measurements completed on examination: See above findings.               PT Education - 08/25/20 1520    Education Details Exam findings, POC    Person(s) Educated Patient    Methods Explanation    Comprehension Verbalized understanding;Need further instruction            PT Short Term Goals - 08/25/20 1630      PT SHORT TERM GOAL #1   Title STG = LTG             PT Long Term Goals - 08/25/20 1630      PT LONG TERM GOAL #1   Title Pt will be independent with advanced HEP to improve balance at home    Baseline Not provided    Time 6    Period Weeks    Status New    Target Date 10/06/20      PT LONG TERM GOAL #2   Title Pt will improve Berg Balance Score to at least 45/56 for reduced fall risk    Baseline 41/56 on 10/8    Time 6     Period Weeks    Status New    Target Date 10/06/20      PT LONG TERM GOAL #3   Title Pt will be able to perform SLS for 5 sec to demonstrate improved functional ankle and hip stability    Baseline 1 sec bilat    Time 6    Period Weeks    Status New    Target Date 10/06/20      PT LONG TERM GOAL #4   Title Pt will be able to perform 5x STS in <12 sec to indicate reduced fall risk and improved functional hip strength    Baseline 24.47 sec on 10/8    Time 6    Period Weeks    Status New    Target Date 10/06/20  PT LONG TERM GOAL #5   Title Pt will improve DGI to at least 22/24 for reduced risk of fall    Baseline 21/24 on 10/8    Time 6    Period Weeks    Status New    Target Date 10/06/20                  Plan - 08/25/20 1522    Clinical Impression Statement Mr. Blaisdell is a 74 y/o M presenting to OPPT due to c/o slow onset of progressively worsening balance since around April 2021. Pt notes 1 instance of fall going up the stairs but mostly complains of multiple mild LOBs at home resulting in bumps and bruises along his arm. Pt demos decreased ankle strength and stability likely contributing to his increased LOBs. Pt's Berg Balance Score and 5 Times Sit to Stand place him at a high fall risk. Pt would benefit from PT to address these issues to optimize his safety at home and in the community.    Personal Factors and Comorbidities Age;Time since onset of injury/illness/exacerbation    Examination-Activity Limitations Locomotion Level;Stairs;Squat    Examination-Participation Restrictions Community Activity;Occupation   Part time job as Scientist, clinical (histocompatibility and immunogenetics) at [employer] Stable/Uncomplicated    Clinical Decision Making Low    Rehab Potential Good    PT Frequency 1x / week    PT Duration 6 weeks    PT Treatment/Interventions ADLs/Self Care Home Management;Cryotherapy;Electrical Stimulation;Iontophoresis 4mg /ml Dexamethasone;Moist Heat;Gait  training;Stair training;Functional mobility training;Therapeutic activities;Therapeutic exercise;Balance training;Neuromuscular re-education;Patient/family education;Manual techniques;Passive range of motion;Vestibular    PT Next Visit Plan Provide initial HEP. Consider ankle strengthening/stability exercises (i.e. foam, rockerboard), dynamic balance such as challenge weight shifting/limits of stability (especially anterior/posterior), and static balance (i.e. tandem stance).    PT Home Exercise Plan Will provide next session    Consulted and Agree with Plan of Care Patient           Patient will benefit from skilled therapeutic intervention in order to improve the following deficits and impairments:  Difficulty walking, Decreased balance, Improper body mechanics, Postural dysfunction, Decreased strength, Decreased mobility  Visit Diagnosis: Unsteadiness on feet  Abnormal posture  Muscle weakness (generalized)     Problem List Patient Active Problem List   Diagnosis Date Noted  . Microalbuminuria due to type 2 diabetes mellitus ([place]) 05/25/2020  . Essential hypertension 06/05/2017  . Uncontrolled type 2 diabetes mellitus with hyperglycemia, with long-term current use of insulin ([place]) 02/28/2016  . Mixed hyperlipidemia 02/28/2016    Chyrel Taha April Ma L Jadien Lehigh PT, DPT 08/25/2020, 4:43 PM  [facility] [address] [place], [place], [address] Phone: [phone]   Fax:  [phone]  Name: JSHAUN ABERNATHY MRN: [id number] Date of Birth: 07-05-1946

## 2020-08-30 ENCOUNTER — Ambulatory Visit: Payer: Medicare HMO | Admitting: Physical Therapy

## 2020-08-30 ENCOUNTER — Other Ambulatory Visit: Payer: Self-pay

## 2020-08-30 DIAGNOSIS — R2681 Unsteadiness on feet: Secondary | ICD-10-CM | POA: Diagnosis not present

## 2020-08-30 DIAGNOSIS — M6281 Muscle weakness (generalized): Secondary | ICD-10-CM | POA: Diagnosis not present

## 2020-08-30 DIAGNOSIS — R293 Abnormal posture: Secondary | ICD-10-CM | POA: Diagnosis not present

## 2020-08-30 NOTE — Patient Instructions (Signed)
Access Code: [email or web address] URL: [email or web address] Date: 08/30/2020 Prepared by: Estill Bamberg April Thurnell Garbe  Exercises Tandem Stance on Foam Pad with Eyes Open - 1 x daily - 7 x weekly - 3 sets - 30 sec hold Tandem Stance with Eyes Closed in Corner - 1 x daily - 7 x weekly - 3 sets - 30 sec hold Heel rises with counter support - 1 x daily - 7 x weekly - 3 sets - 10 reps Heel Toe Raises with Counter Support - 1 x daily - 7 x weekly - 3 sets - 10 reps

## 2020-08-30 NOTE — Therapy (Signed)
[facility] [address] [place], [place], [address] Phone: [phone]   Fax:  [phone]  Physical Therapy Treatment  Patient Details  Name: MONTEZ CUDA MRN: [id number] Date of Birth: 1946-03-22 Referring Provider (PT): Antony Contras, MD   Encounter Date: 08/30/2020   PT End of Session - 08/30/20 1321    Visit Number 2    Number of Visits 6    Date for PT Re-Evaluation 10/06/20    Authorization Type [employer] MCR -- auth requested 10/8, needs 10th visit progress note    Progress Note Due on Visit 10    PT Start Time 1315    PT Stop Time 1400    PT Time Calculation (min) 45 min    Activity Tolerance Patient tolerated treatment well    Behavior During Therapy [facility] for tasks assessed/performed           Past Medical History:  Diagnosis Date  . CKD (chronic kidney disease), stage III ([place])   . Depression   . Diabetes mellitus without complication ([place])   . ED (erectile dysfunction)   . GERD (gastroesophageal reflux disease)   . Hyperlipidemia   . Hypertension   . Plantar fasciitis   . Prostatitis     Past Surgical History:  Procedure Laterality Date  . CIRCUMCISION    . COLONOSCOPY    . MOUTH SURGERY  01/2020    There were no vitals filed for this visit.   Subjective Assessment - 08/30/20 1318    Subjective Pt reports falling out of bed the other day when his feet got tangled uner the covers. Pt reports L rib pain. No other falls noted.    Pertinent History COVID Jan 2021    How long can you sit comfortably? n/a    How long can you stand comfortably? n/a    How long can you walk comfortably? n/a    Diagnostic tests MRI brain (with and without) demonstrating:- Mild atrophy and mild chronic small vessel ischemic disease. - No acute findings.    Patient Stated Goals Improve balance and not hurt himself    Currently in Pain? Yes    Pain Score 1     Pain Location Rib cage    Pain Orientation Left                              [facility] Adult PT Treatment/Exercise - 08/30/20 0001      Exercises   Exercises Knee/Hip      Knee/Hip Exercises: Stretches   Gastroc Stretch Both;30 seconds    Soleus Stretch Both;30 seconds      Knee/Hip Exercises: Aerobic   Nustep L6 x 5 min UE & LE      Knee/Hip Exercises: Standing   Heel Raises Both;20 reps    Rocker Board 1 minute   1 min foward/backward, 1 min lateral shifting   Rocker Board Limitations cues to shift weight over toes    Other Standing Knee Exercises Toe raise x20 bilat    Other Standing Knee Exercises On compliant surface EO: feet apart with head nods x1 min, head turns x 1 min; EC: head nods x30 sec, head turns x 30 sec           In parallel bars:  Toe raises bilat x20  Tandem stance x2 with 30 sec hold each foot on firm surface  Tandem stance x10 with 10 sec holds alternating  feet on foam  Tandem walking x4 across bars on foam  Side stepping x4 across bars on foam  Forward reach 4" x 10 L and R UE alternating on foam          PT Short Term Goals - 08/25/20 1630      PT SHORT TERM GOAL #1   Title STG = LTG             PT Long Term Goals - 08/25/20 1630      PT LONG TERM GOAL #1   Title Pt will be independent with advanced HEP to improve balance at home    Baseline Not provided    Time 6    Period Weeks    Status New    Target Date 10/06/20      PT LONG TERM GOAL #2   Title Pt will improve Berg Balance Score to at least 45/56 for reduced fall risk    Baseline 41/56 on 10/8    Time 6    Period Weeks    Status New    Target Date 10/06/20      PT LONG TERM GOAL #3   Title Pt will be able to perform SLS for 5 sec to demonstrate improved functional ankle and hip stability    Baseline 1 sec bilat    Time 6    Period Weeks    Status New    Target Date 10/06/20      PT LONG TERM GOAL #4   Title Pt will be able to perform 5x STS in <12 sec to indicate reduced fall risk and improved  functional hip strength    Baseline 24.47 sec on 10/8    Time 6    Period Weeks    Status New    Target Date 10/06/20      PT LONG TERM GOAL #5   Title Pt will improve DGI to at least 22/24 for reduced risk of fall    Baseline 21/24 on 10/8    Time 6    Period Weeks    Status New    Target Date 10/06/20                 Plan - 08/30/20 1408    Clinical Impression Statement Treatment focused on establishing balance HEP for pt. Pt demonstrates difficulty with forward weight shift and weightshifting to R LE. Pt highly challenged with foam exercises and narrow BOS.    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 Continue ankle strengthening/challenges on foam, dynamic gait & balance, consider addition of general LE strengthening (i.e. squats & sit to stands). Progress pt to work on SLS as able.    PT Home Exercise Plan Access Code: [email or web address]    Consulted and Agree with Plan of Care Patient           Patient will benefit from skilled therapeutic intervention in order to improve the following deficits and impairments:  Difficulty  walking, Decreased balance, Improper body mechanics, Postural dysfunction, Decreased strength, Decreased  mobility  Visit Diagnosis: Unsteadiness on feet  Abnormal posture  Muscle weakness (generalized)     Problem List Patient Active Problem List   Diagnosis Date Noted  . Microalbuminuria due to type 2 diabetes mellitus ([place]) 05/25/2020  . Essential hypertension 06/05/2017  . Uncontrolled type 2 diabetes mellitus with hyperglycemia, with long-term current use of insulin ([place]) 02/28/2016  . Mixed hyperlipidemia 02/28/2016    Alexande Sheerin April Ma L Zephaniah Enyeart PT, DPT 08/30/2020, 2:16 PM  [facility] [address] [place], [place], [address] Phone: [phone]   Fax:  [phone]  Name: VELTON ROSELLE MRN: [id number] Date of Birth: [birth year]

## 2020-09-04 DIAGNOSIS — S301XXA Contusion of abdominal wall, initial encounter: Secondary | ICD-10-CM | POA: Diagnosis not present

## 2020-09-05 DIAGNOSIS — F341 Dysthymic disorder: Secondary | ICD-10-CM | POA: Diagnosis not present

## 2020-09-06 ENCOUNTER — Other Ambulatory Visit: Payer: Self-pay

## 2020-09-06 ENCOUNTER — Ambulatory Visit: Payer: Medicare HMO | Admitting: Physical Therapy

## 2020-09-06 ENCOUNTER — Encounter: Payer: Self-pay | Admitting: Physical Therapy

## 2020-09-06 DIAGNOSIS — M6281 Muscle weakness (generalized): Secondary | ICD-10-CM | POA: Diagnosis not present

## 2020-09-06 DIAGNOSIS — R293 Abnormal posture: Secondary | ICD-10-CM | POA: Diagnosis not present

## 2020-09-06 DIAGNOSIS — R2681 Unsteadiness on feet: Secondary | ICD-10-CM

## 2020-09-06 NOTE — Therapy (Signed)
[facility] [address] [place], [place], [address] Phone: [phone]   Fax:  [phone]  Physical Therapy Treatment  Patient Details  Name: Joel Webb MRN: [id number] Date of Birth: [birth year] Referring Provider (PT): Antony Contras, MD   Encounter Date: 09/06/2020   PT End of Session - 09/06/20 1401    Visit Number 3    Number of Visits 6    Date for PT Re-Evaluation 10/06/20    Authorization Type [employer] MCR -- auth requested 10/8, needs 10th visit progress note    Progress Note Due on Visit 10    PT Start Time 1315    PT Stop Time 1400    PT Time Calculation (min) 45 min    Equipment Utilized During Treatment Gait belt    Activity Tolerance Patient tolerated treatment well    Behavior During Therapy [facility] for tasks assessed/performed           Past Medical History:  Diagnosis Date  . CKD (chronic kidney disease), stage III ([place])   . Depression   . Diabetes mellitus without complication ([place])   . ED (erectile dysfunction)   . GERD (gastroesophageal reflux disease)   . Hyperlipidemia   . Hypertension   . Plantar fasciitis   . Prostatitis     Past Surgical History:  Procedure Laterality Date  . CIRCUMCISION    . COLONOSCOPY    . MOUTH SURGERY  01/2020    There were no vitals filed for this visit.   Subjective Assessment - 09/06/20 1322    Subjective Pt states he had no problems with his ankle strengthening exercises. He notes he has been able to stay in tandem stance with eyes open for 10 sec and eyes closed only for 5 sec.    Pertinent History COVID Jan 2021    How long can you sit comfortably? n/a    How long can you stand comfortably? n/a    How long can you walk comfortably? n/a    Diagnostic tests MRI brain (with and without) demonstrating:- Mild atrophy and mild chronic small vessel ischemic disease. - No acute findings.    Patient Stated Goals Improve balance and not hurt himself      Currently in Pain? No/denies                             [facility] Adult PT Treatment/Exercise - 09/06/20 0001      Knee/Hip Exercises: Standing   Heel Raises Right;Left;2 sets;10 reps    Other Standing Knee Exercises Toe raise SLS R & L 2x10                Balance Exercises - 09/06/20 0001      Balance Exercises: Standing   Standing Eyes Opened Narrow base of support (BOS);Foam/compliant surface;30 secs   Performed with feet together and then with feet in partial t   Standing Eyes Closed Narrow base of support (BOS);Foam/compliant surface;30 secs   Performed with feet together and then in partial tandem   Tandem Stance Eyes open;Foam/compliant surface;Eyes closed;2 reps;30 secs   EO on foam; EC on solid ground; reach forward x10 bilat   SLS with Vectors Solid surface;Upper extremity assist 1   x10 tapping forward, x10 tapping to side   Standing, One Foot on a Step Eyes open;4 inch;30 secs;Eyes closed;Head turns   R foot on step  PT Short Term Goals - 08/25/20 1630      PT SHORT TERM GOAL #1   Title STG = LTG             PT Long Term Goals - 08/25/20 1630      PT LONG TERM GOAL #1   Title Pt will be independent with advanced HEP to improve balance at home    Baseline Not provided    Time 6    Period Weeks    Status New    Target Date 10/06/20      PT LONG TERM GOAL #2   Title Pt will improve Berg Balance Score to at least 45/56 for reduced fall risk    Baseline 41/56 on 10/8    Time 6    Period Weeks    Status New    Target Date 10/06/20      PT LONG TERM GOAL #3   Title Pt will be able to perform SLS for 5 sec to demonstrate improved functional ankle and hip stability    Baseline 1 sec bilat    Time 6    Period Weeks    Status New    Target Date 10/06/20      PT LONG TERM GOAL #4   Title Pt will be able to perform 5x STS in <12 sec to indicate reduced fall risk and improved functional hip strength    Baseline 24.47  sec on 10/8    Time 6    Period Weeks    Status New    Target Date 10/06/20      PT LONG TERM GOAL #5   Title Pt will improve DGI to at least 22/24 for reduced risk of fall    Baseline 21/24 on 10/8    Time 6    Period Weeks    Status New    Target Date 10/06/20                 Plan - 09/06/20 1410    Clinical Impression Statement Treatment session continues to focus on improving pt's ankle strategy/stability on foam, weight shifting, and progressing pt to maintain improved SLS stance. Pt highly challenged when most of weight is placed on L LE this session with increased LOBs. Pt is progressing towards PT goals.    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 Continue ankle strengthening/challenges on foam, dynamic gait & balance. Add general LE strengthening (i.e. squats or sit to stands) into HEP. Progress pt to work on SLS as able.    PT Home Exercise Plan Access Code: [email or web address]    Consulted and Agree with Plan of Care Patient           Patient will benefit from skilled therapeutic intervention in order to improve the following deficits and impairments:  Difficulty walking, Decreased balance, Improper body mechanics, Postural  dysfunction, Decreased strength, Decreased mobility  Visit Diagnosis: Unsteadiness on feet  Abnormal posture  Muscle weakness (  generalized)     Problem List Patient Active Problem List   Diagnosis Date Noted  . Microalbuminuria due to type 2 diabetes mellitus ([place]) 05/25/2020  . Essential hypertension 06/05/2017  . Uncontrolled type 2 diabetes mellitus with hyperglycemia, with long-term current use of insulin ([place]) 02/28/2016  . Mixed hyperlipidemia 02/28/2016    Vidhi Delellis April Ma L Ulys Favia PT, DPT 09/06/2020, 2:13 PM  [facility] [address] [place], [place], [address] Phone: [phone]   Fax:  [phone]  Name: BRAIDON CHERMAK MRN: [id number] Date of Birth: 07-Aug-1946

## 2020-09-11 ENCOUNTER — Other Ambulatory Visit: Payer: Self-pay | Admitting: *Deleted

## 2020-09-11 MED ORDER — PEN NEEDLES 31G X 6 MM MISC: 2 refills | Status: DC

## 2020-09-13 ENCOUNTER — Encounter: Payer: Self-pay | Admitting: Physical Therapy

## 2020-09-13 ENCOUNTER — Other Ambulatory Visit: Payer: Self-pay

## 2020-09-13 ENCOUNTER — Ambulatory Visit: Payer: Medicare HMO | Admitting: Physical Therapy

## 2020-09-13 DIAGNOSIS — M6281 Muscle weakness (generalized): Secondary | ICD-10-CM | POA: Diagnosis not present

## 2020-09-13 DIAGNOSIS — R293 Abnormal posture: Secondary | ICD-10-CM | POA: Diagnosis not present

## 2020-09-13 DIAGNOSIS — R2681 Unsteadiness on feet: Secondary | ICD-10-CM

## 2020-09-13 NOTE — Patient Instructions (Signed)
Access Code: [email or web address] URL: [email or web address] Date: 09/13/2020 Prepared by: Janann August  Exercises Tandem Stance on Foam Pad with Eyes Open - 1 x daily - 7 x weekly - 3 sets - 30 sec hold Tandem Stance with Eyes Closed in Corner - 1 x daily - 7 x weekly - 3 sets - 30 sec hold Heel rises with counter support - 1 x daily - 7 x weekly - 3 sets - 10 reps Heel Toe Raises with Counter Support - 1 x daily - 7 x weekly - 3 sets - 10 reps Sit to Stand - 1 x daily - 5 x weekly - 2 sets - 10 reps

## 2020-09-13 NOTE — Therapy (Signed)
[facility] [address] [place], [place], [address] Phone: [phone]   Fax:  [phone]  Physical Therapy Treatment  Patient Details  Name: Joel Webb MRN: [id number] Date of Birth: 01-31-1946 Referring Provider (PT): Antony Contras, MD   Encounter Date: 09/13/2020   PT End of Session - 09/13/20 1403    Visit Number 4    Number of Visits 6    Date for PT Re-Evaluation 10/06/20    Authorization Type [employer] MCR -- auth requested 10/8, needs 10th visit progress note    Progress Note Due on Visit 10    PT Start Time 1317    PT Stop Time 1358    PT Time Calculation (min) 41 min    Equipment Utilized During Treatment Gait belt    Activity Tolerance Patient tolerated treatment well    Behavior During Therapy [facility] for tasks assessed/performed           Past Medical History:  Diagnosis Date  . CKD (chronic kidney disease), stage III ([place])   . Depression   . Diabetes mellitus without complication ([place])   . ED (erectile dysfunction)   . GERD (gastroesophageal reflux disease)   . Hyperlipidemia   . Hypertension   . Plantar fasciitis   . Prostatitis     Past Surgical History:  Procedure Laterality Date  . CIRCUMCISION    . COLONOSCOPY    . MOUTH SURGERY  01/2020    There were no vitals filed for this visit.   Subjective Assessment - 09/13/20 1319    Subjective No falls. Still having incr trouble with eyes closed.    Pertinent History COVID Jan 2021    How long can you sit comfortably? n/a    How long can you stand comfortably? n/a    How long can you walk comfortably? n/a    Diagnostic tests MRI brain (with and without) demonstrating:- Mild atrophy and mild chronic small vessel ischemic disease. - No acute findings.    Patient Stated Goals Improve balance and not hurt himself    Currently in Pain? No/denies                                  Balance Exercises - 09/13/20  0001      Balance Exercises: Standing   Standing Eyes Closed Narrow base of support (BOS);Foam/compliant surface;3 reps;30 secs   on blue air ex   Stepping Strategy Anterior;Foam/compliant surface;10 reps;Limitations    Stepping Strategy Limitations on red balance beam beginning with UE support and progressing to none, min guard/min A for balance    Rockerboard Anterior/posterior;EO;Limitations    Rockerboard Limitations weight shifting onto heels and then onto toes x12 reps, pt with incr difficulty coming forwards onto toes    Tandem Gait Limitations    Tandem Gait Limitations on red/blue mats with intermittent UE support, x3 reps down and back   Marching Foam/compliant surface;Forwards;Limitations    Marching Limitations on red/blue mats x4 reps down and back, cues for slowed and controlled, intermittent min guard for balance    Sit to Stand Without upper extremity support;Foam/compliant surface;Limitations    Sit to Stand Limitations on air ex x10 reps - cues for forward weight shift and nose over toes to decr BLE bracing against mat table    Other Standing Exercises on air ex: feet hip width distance eyes closed 2 x 10 reps head turns, 2  x 10 reps head nods    Other Standing Exercises Comments on red balance beam with feet apart and eyes open: x10 reps head turns and x10 reps  head nods             PT Education - 09/13/20 1357    Education Details added sit <> stands to [employer]) Educated Patient    Methods Explanation;Handout    Comprehension Verbalized understanding;Returned demonstration            PT Short Term Goals - 08/25/20 1630      PT SHORT TERM GOAL #1   Title STG = LTG             PT Long Term Goals - 08/25/20 1630      PT LONG TERM GOAL #1   Title Pt will be independent with advanced HEP to improve balance at home    Baseline Not provided    Time 6    Period Weeks    Status New    Target Date 10/06/20      PT LONG TERM GOAL #2   Title Pt  will improve Berg Balance Score to at least 45/56 for reduced fall risk    Baseline 41/56 on 10/8    Time 6    Period Weeks    Status New    Target Date 10/06/20      PT LONG TERM GOAL #3   Title Pt will be able to perform SLS for 5 sec to demonstrate improved functional ankle and hip stability    Baseline 1 sec bilat    Time 6    Period Weeks    Status New    Target Date 10/06/20      PT LONG TERM GOAL #4   Title Pt will be able to perform 5x STS in <12 sec to indicate reduced fall risk and improved functional hip strength    Baseline 24.47 sec on 10/8    Time 6    Period Weeks    Status New    Target Date 10/06/20      PT LONG TERM GOAL #5   Title Pt will improve DGI to at least 22/24 for reduced risk of fall    Baseline 21/24 on 10/8    Time 6    Period Weeks    Status New    Target Date 10/06/20                 Plan - 09/13/20 1528    Clinical Impression Statement Today's skilled session continued to focus on balance strategies on compliant surfaces with vision removed, narrow BOS, and SLS. Pt continues to have incr difficulty with more SLS activities on LLE. Also challenged by shifting weight onto toes on rockerboard. Will continue to progress towards LTGs.    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 Add any more strengthening into HEP. Progress  pt to work on SLS as able.    PT Home Exercise Plan Access Code: [email or web address]    Consulted and Agree with Plan of Care Patient           Patient will benefit from skilled therapeutic intervention in order to improve the following deficits and impairments:  Difficulty walking, Decreased balance, Improper body mechanics, Postural dysfunction, Decreased strength, Decreased mobility  Visit Diagnosis: Muscle weakness (generalized)  Unsteadiness on feet  Abnormal posture     Problem List Patient Active Problem List   Diagnosis Date Noted  . Microalbuminuria due to type 2 diabetes mellitus ([place]) 05/25/2020  . Essential hypertension 06/05/2017  . Uncontrolled type 2 diabetes mellitus with hyperglycemia, with long-term current use of insulin ([place]) 02/28/2016  . Mixed hyperlipidemia 02/28/2016    Arliss Journey, PT, DPT  09/13/2020, 3:29 PM  [facility] [address] [place], [place], [address] Phone: [phone]   Fax:  [phone]  Name: JOSETH WEIGEL MRN: [id number] Date of Birth: 10-26-1946

## 2020-09-20 ENCOUNTER — Ambulatory Visit: Payer: Medicare HMO | Attending: Family Medicine | Admitting: Physical Therapy

## 2020-09-20 ENCOUNTER — Other Ambulatory Visit: Payer: Self-pay

## 2020-09-20 ENCOUNTER — Telehealth: Payer: Self-pay | Admitting: Diagnostic Neuroimaging

## 2020-09-20 DIAGNOSIS — M6281 Muscle weakness (generalized): Secondary | ICD-10-CM | POA: Diagnosis not present

## 2020-09-20 DIAGNOSIS — R293 Abnormal posture: Secondary | ICD-10-CM | POA: Diagnosis not present

## 2020-09-20 DIAGNOSIS — R2681 Unsteadiness on feet: Secondary | ICD-10-CM

## 2020-09-20 NOTE — Therapy (Signed)
[facility] [address] [place], [place], [address] Phone: [phone]   Fax:  [phone]  Physical Therapy Treatment  Patient Details  Name: Joel Webb MRN: [id number] Date of Birth: 1946-08-25 Referring Provider (PT): Antony Contras, MD   Encounter Date: 09/20/2020   PT End of Session - 09/20/20 1339    Visit Number 5    Number of Visits 6    Date for PT Re-Evaluation 10/06/20    Authorization Type [employer] MCR -- auth requested 10/8, needs 10th visit progress note    Progress Note Due on Visit 10    PT Start Time 1330    PT Stop Time 1408    PT Time Calculation (min) 38 min    Equipment Utilized During Treatment Gait belt    Activity Tolerance Patient tolerated treatment well    Behavior During Therapy [facility] for tasks assessed/performed           Past Medical History:  Diagnosis Date  . CKD (chronic kidney disease), stage III ([place])   . Depression   . Diabetes mellitus without complication ([place])   . ED (erectile dysfunction)   . GERD (gastroesophageal reflux disease)   . Hyperlipidemia   . Hypertension   . Plantar fasciitis   . Prostatitis     Past Surgical History:  Procedure Laterality Date  . CIRCUMCISION    . COLONOSCOPY    . MOUTH SURGERY  01/2020    There were no vitals filed for this visit.   Subjective Assessment - 09/20/20 1336    Subjective Pt states he still has trouble with eyes closed. no falls noted    Pertinent History COVID Jan 2021    How long can you sit comfortably? n/a    How long can you stand comfortably? n/a    How long can you walk comfortably? n/a    Diagnostic tests MRI brain (with and without) demonstrating:- Mild atrophy and mild chronic small vessel ischemic disease. - No acute findings.    Patient Stated Goals Improve balance and not hurt himself    Currently in Pain? No/denies                             [facility] Adult PT Treatment/Exercise  - 09/20/20 0001      Knee/Hip Exercises: Standing   Heel Raises Right;Left;2 sets;10 reps      Knee/Hip Exercises: Seated   Sit to Sand 10 reps   L foot back, R foot forward              Balance Exercises - 09/20/20 0001      Balance Exercises: Standing   Standing Eyes Closed Narrow base of support (BOS);Foam/compliant surface;2 reps;Time   60 sec   Tandem Stance Eyes open;Foam/compliant surface;Eyes closed;2 reps;Time;Cognitive challenge;Intermittent upper extremity support   partial tandem 60 sec on foam; full tandem 60 sec floor   Rockerboard Anterior/posterior;EO;Limitations   2x60 sec   Rockerboard Limitations weight shifting    Step Ups Forward;2 inch   on to foam x10 bilat   Other Standing Exercises standing on black foam bar: tap foot down 2x10; EO head nods & head turns 2x10               PT Short Term Goals - 08/25/20 1630      PT SHORT TERM GOAL #1   Title STG = LTG  PT Long Term Goals - 08/25/20 1630      PT LONG TERM GOAL #1   Title Pt will be independent with advanced HEP to improve balance at home    Baseline Not provided    Time 6    Period Weeks    Status New    Target Date 10/06/20      PT LONG TERM GOAL #2   Title Pt will improve Berg Balance Score to at least 45/56 for reduced fall risk    Baseline 41/56 on 10/8    Time 6    Period Weeks    Status New    Target Date 10/06/20      PT LONG TERM GOAL #3   Title Pt will be able to perform SLS for 5 sec to demonstrate improved functional ankle and hip stability    Baseline 1 sec bilat    Time 6    Period Weeks    Status New    Target Date 10/06/20      PT LONG TERM GOAL #4   Title Pt will be able to perform 5x STS in <12 sec to indicate reduced fall risk and improved functional hip strength    Baseline 24.47 sec on 10/8    Time 6    Period Weeks    Status New    Target Date 10/06/20      PT LONG TERM GOAL #5   Title Pt will improve DGI to at least 22/24 for reduced  risk of fall    Baseline 21/24 on 10/8    Time 6    Period Weeks    Status New    Target Date 10/06/20                 Plan - 09/20/20 1415    Clinical Impression Statement Treatment continues to progress pt's balance strategies, stepping, and stability on compliant surfaces with vision removed and narrow BOS. Pt demonstrates decreased ability to stabilize on L LE vs R LE and requires cues to shift weight forward onto toes. Pt continues to progress towards LTGs.    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 Add any more strengthening into HEP. Progress pt to work on SLS as able.    PT Home Exercise Plan Access Code: [email or web address]    Consulted and Agree with Plan of Care Patient           Patient will benefit from skilled therapeutic intervention in order to improve the following deficits and impairments:  Difficulty walking, Decreased balance, Improper body mechanics, Postural dysfunction, Decreased strength, Decreased mobility  Visit Diagnosis: Unsteadiness on feet  Muscle weakness (generalized)  Abnormal posture     Problem List Patient Active Problem  List   Diagnosis Date Noted  . Microalbuminuria due to type 2 diabetes mellitus ([place]) 05/25/2020  . Essential hypertension 06/05/2017  . Uncontrolled type 2 diabetes mellitus with hyperglycemia,  with long-term current use of insulin ([place]) 02/28/2016  . Mixed hyperlipidemia 02/28/2016    Chariti Havel April Ma L Acire Tang PT, DPT 09/20/2020, 2:18 PM  [facility] [address] [place], [place], [address] Phone: [phone]   Fax:  [phone]  Name: JAMIESON HETLAND MRN: [id number] Date of Birth: [birth year]
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
## 2020-09-25 ENCOUNTER — Other Ambulatory Visit: Payer: Self-pay | Admitting: *Deleted

## 2020-09-25 MED ORDER — TRESIBA FLEXTOUCH 200 UNIT/ML ~~LOC~~ SOPN: PEN_INJECTOR | SUBCUTANEOUS | 1 refills | Status: DC

## 2020-09-26 DIAGNOSIS — E119 Type 2 diabetes mellitus without complications: Secondary | ICD-10-CM | POA: Diagnosis not present

## 2020-09-26 DIAGNOSIS — H2513 Age-related nuclear cataract, bilateral: Secondary | ICD-10-CM | POA: Diagnosis not present

## 2020-09-26 DIAGNOSIS — H5203 Hypermetropia, bilateral: Secondary | ICD-10-CM | POA: Diagnosis not present

## 2020-09-27 ENCOUNTER — Ambulatory Visit: Payer: Medicare HMO | Admitting: Physical Therapy

## 2020-09-27 ENCOUNTER — Other Ambulatory Visit: Payer: Self-pay

## 2020-09-27 DIAGNOSIS — M6281 Muscle weakness (generalized): Secondary | ICD-10-CM

## 2020-09-27 DIAGNOSIS — R2681 Unsteadiness on feet: Secondary | ICD-10-CM | POA: Diagnosis not present

## 2020-09-27 DIAGNOSIS — R293 Abnormal posture: Secondary | ICD-10-CM

## 2020-09-27 NOTE — Therapy (Signed)
[facility] [address] [place], [place], [address] Phone: [phone]   Fax:  [phone]  Physical Therapy Treatment  Patient Details  Name: Joel Webb MRN: [id number] Date of Birth: 12-19-1945 Referring Provider (PT): Joel Contras, MD   Encounter Date: 09/27/2020   PT End of Session - 09/27/20 1424    Visit Number 6    Number of Visits 7    Date for PT Re-Evaluation 10/06/20    Authorization Type [employer] MCR -- auth requested 10/8, needs 10th visit progress note    Progress Note Due on Visit 10    PT Start Time 1315    PT Stop Time 1403    PT Time Calculation (min) 48 min    Equipment Utilized During Treatment Gait belt    Activity Tolerance Patient tolerated treatment well    Behavior During Therapy [facility] for tasks assessed/performed           Past Medical History:  Diagnosis Date  . CKD (chronic kidney disease), stage III ([place])   . Depression   . Diabetes mellitus without complication ([place])   . ED (erectile dysfunction)   . GERD (gastroesophageal reflux disease)   . Hyperlipidemia   . Hypertension   . Plantar fasciitis   . Prostatitis     Past Surgical History:  Procedure Laterality Date  . CIRCUMCISION    . COLONOSCOPY    . MOUTH SURGERY  01/2020    There were no vitals filed for this visit.                      [facility] Adult PT Treatment/Exercise - 09/27/20 0001      Knee/Hip Exercises: Seated   Sit to Sand 10 reps;without UE support               Balance Exercises - 09/27/20 0001      Balance Exercises: Standing   Standing Eyes Opened Narrow base of support (BOS);Foam/compliant surface;30 secs;Head turns;2 reps    Standing Eyes Closed Narrow base of support (BOS);Foam/compliant surface;Time;Wide (BOA);3 reps   30 sec   SLS Eyes open;Solid surface;3 reps;30 secs;Intermittent upper extremity support   only able to hold 5 sec without support   Tandem Gait  Forward;Foam/compliant surface;4 reps   on blue foam   Retro Gait 4 reps   x50'   Other Standing Exercises On airex: lean forward 4 cones    Other Standing Exercises Comments Stepping forward over 4 orange hurdles x 4 reps; stepping side ways over 4 orange hurdle x4 reps               PT Short Term Goals - 08/25/20 1630      PT SHORT TERM GOAL #1   Title STG = LTG             PT Long Term Goals - 08/25/20 1630      PT LONG TERM GOAL #1   Title Pt will be independent with advanced HEP to improve balance at home    Baseline Not provided    Time 6    Period Weeks    Status New    Target Date 10/06/20      PT LONG TERM GOAL #2   Title Pt will improve Berg Balance Score to at least 45/56 for reduced fall risk    Baseline 41/56 on 10/8    Time 6    Period Weeks    Status  New    Target Date 10/06/20      PT LONG TERM GOAL #3   Title Pt will be able to perform SLS for 5 sec to demonstrate improved functional ankle and hip stability    Baseline 1 sec bilat    Time 6    Period Weeks    Status New    Target Date 10/06/20      PT LONG TERM GOAL #4   Title Pt will be able to perform 5x STS in <12 sec to indicate reduced fall risk and improved functional hip strength    Baseline 24.47 sec on 10/8    Time 6    Period Weeks    Status New    Target Date 10/06/20      PT LONG TERM GOAL #5   Title Pt will improve DGI to at least 22/24 for reduced risk of fall    Baseline 21/24 on 10/8    Time 6    Period Weeks    Status New    Target Date 10/06/20                 Plan - 09/27/20 1419    Clinical Impression Statement Treatment focused on obstacle negotiation, stepping/weight shifting, and balance on compliant surface. Pt continues to be the most challenged with narrow BOS, compliant surface, vision removed. Challenged pt with retro gait; pt has difficulty performing reciprocal pattern. Pt is demonstrating improvement with his SLS this session. Pt continues to  progress towards goals.    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 Continue ankle strengthening/challenges on foam with vision removed, dynamic gait & balance. Add any more strengthening into HEP. Progress pt to work on SLS as able.    PT Home Exercise Plan Access Code: [email or web address]; added retro gait    Consulted and Agree with Plan of Care Patient           Patient will benefit from skilled therapeutic intervention in order to improve the following deficits and impairments:  Difficulty walking, Decreased balance, Improper body mechanics, Postural dysfunction, Decreased strength, Decreased mobility  Visit Diagnosis: Unsteadiness on feet  Muscle weakness (generalized)  Abnormal posture     Problem List Patient Active Problem List   Diagnosis Date Noted  . Microalbuminuria due to type 2 diabetes mellitus ([place]) 05/25/2020  . Essential hypertension 06/05/2017  . Uncontrolled type 2 diabetes mellitus with hyperglycemia, with long-term current use of insulin ([place]) 02/28/2016  . Mixed hyperlipidemia 02/28/2016    Joel Webb Joel Webb PT, DPT 09/27/2020, 2:31 PM  [facility] [address] [place], [place], [address] Phone: [phone]   Fax:  [phone]  Name: Joel Webb MRN: [id number] Date of Birth: 1946-05-12

## 2020-10-04 ENCOUNTER — Other Ambulatory Visit: Payer: Self-pay

## 2020-10-04 ENCOUNTER — Ambulatory Visit: Payer: Medicare HMO | Admitting: Physical Therapy

## 2020-10-04 DIAGNOSIS — R293 Abnormal posture: Secondary | ICD-10-CM | POA: Diagnosis not present

## 2020-10-04 DIAGNOSIS — M6281 Muscle weakness (generalized): Secondary | ICD-10-CM | POA: Diagnosis not present

## 2020-10-04 DIAGNOSIS — R2681 Unsteadiness on feet: Secondary | ICD-10-CM | POA: Diagnosis not present

## 2020-10-04 NOTE — Therapy (Signed)
[facility] [address] [place], [place], [address] Phone: [phone]   Fax:  [phone]  Physical Therapy Treatment and Re-Certification  Patient Details  Name: Joel Webb MRN: [id number] Date of Birth: [birth year] Referring Provider (PT): Antony Contras, MD   Encounter Date: 10/04/2020   PT End of Session - 10/04/20 1422    Visit Number 7    Number of Visits 11    Date for PT Re-Evaluation 10/06/20    Authorization Type [employer] MCR -- auth requested 10/8, needs 10th visit progress note    Progress Note Due on Visit 10    PT Start Time 1315    PT Stop Time 1400    PT Time Calculation (min) 45 min    Equipment Utilized During Treatment Gait belt    Activity Tolerance Patient tolerated treatment well    Behavior During Therapy [facility] for tasks assessed/performed           Past Medical History:  Diagnosis Date  . CKD (chronic kidney disease), stage III ([place])   . Depression   . Diabetes mellitus without complication ([place])   . ED (erectile dysfunction)   . GERD (gastroesophageal reflux disease)   . Hyperlipidemia   . Hypertension   . Plantar fasciitis   . Prostatitis     Past Surgical History:  Procedure Laterality Date  . CIRCUMCISION    . COLONOSCOPY    . MOUTH SURGERY  01/2020    There were no vitals filed for this visit.   Subjective Assessment - 10/04/20 1319    Subjective Pt states that he does feel he has gotten better at some things.    Pertinent History COVID Jan 2021    How long can you sit comfortably? n/a    How long can you stand comfortably? n/a    How long can you walk comfortably? n/a    Diagnostic tests MRI brain (with and without) demonstrating:- Mild atrophy and mild chronic small vessel ischemic disease. - No acute findings.    Patient Stated Goals Improve balance and not hurt himself    Currently in Pain? No/denies              [facility] PT Assessment - 10/04/20 0001        Ambulation/Gait   Stairs Assistance 5: Supervision      Standardized Balance Assessment   Standardized Balance Assessment Five Times Sit to Stand    Five times sit to stand comments  15.94 sec      Berg Balance Test   Sit to Stand Able to stand without using hands and stabilize independently    Standing Unsupported Able to stand safely 2 minutes    Sitting with Back Unsupported but Feet Supported on Floor or Stool Able to sit safely and securely 2 minutes    Stand to Sit Sits safely with minimal use of hands    Transfers Able to transfer safely, minor use of hands    Standing Unsupported with Eyes Closed Able to stand 10 seconds safely    Standing Unsupported with Feet Together Able to place feet together independently and stand 1 minute safely    From Standing, Reach Forward with Outstretched Arm Can reach forward >12 cm safely (5")    From Standing Position, Pick up Object from Floor Able to pick up shoe safely and easily    From Standing Position, Turn to Look Behind Over each Shoulder Looks behind from both sides and weight  shifts well    Turn 360 Degrees Able to turn 360 degrees safely in 4 seconds or less    Standing Unsupported, Alternately Place Feet on Step/Stool Able to stand independently and complete 8 steps >20 seconds    Standing Unsupported, One Foot in Front Able to plae foot ahead of the other independently and hold 30 seconds    Standing on One Leg Tries to lift leg/unable to hold 3 seconds but remains standing independently    Total Score 50      Dynamic Gait Index   Level Surface Normal    Change in Gait Speed Normal    Gait with Horizontal Head Turns Normal    Gait with Vertical Head Turns Normal    Gait and Pivot Turn Normal    Step Over Obstacle Normal    Step Around Obstacles Normal    Steps Normal    Total Score 24    DGI comment: 24/24      Timed Up and Go Test   Normal TUG (seconds) 10.75                         [facility] Adult PT  Treatment/Exercise - 10/04/20 0001      Ambulation/Gait   Ambulation Distance (Feet) 330 Feet    Assistive device None    Gait Pattern Step-through pattern;Trunk flexed    Ambulation Surface Level;Indoor    Gait velocity 8.15 sec = 4.02 ft/sec    Stair Management Technique Alternating pattern;Other (comment);Step to pattern;No rails   step to pattern with ascent   Number of Stairs 4    Height of Stairs 6      Knee/Hip Exercises: Standing   Heel Raises Right;Left;2 sets;10 reps    Heel Raises Limitations On step      Ankle Exercises: Seated   Other Seated Ankle Exercises green tband 4-way ankle 2x10 bilat                    PT Short Term Goals - 08/25/20 1630      PT SHORT TERM GOAL #1   Title STG = LTG             PT Long Term Goals - 10/04/20 1428      PT LONG TERM GOAL #1   Title Pt will be independent with advanced HEP to improve balance at home    Baseline Not provided    Time 6    Period Weeks    Status Achieved      PT LONG TERM GOAL #2   Title Pt will improve Berg Balance Score to at least 45/56 for reduced fall risk    Baseline 41/56 on 10/8; 50/56 on 10/04/20    Time 6    Period Weeks    Status Achieved      PT LONG TERM GOAL #3   Title Pt will be able to perform SLS for 5 sec to demonstrate improved functional ankle and hip stability (TARGET date 11/01/20)    Baseline 1 sec bilat    Time 4    Period Weeks    Status Revised    Target Date 11/01/20      PT LONG TERM GOAL #4   Title Pt will be able to perform 5x STS in <12 sec to indicate reduced fall risk and improved functional hip strength (TARGET DATE: 11/01/20)    Baseline 24.47 sec on 10/8; 15.94 sec on 10/04/20  Time 4    Period Weeks    Status Revised    Target Date 11/01/20      PT LONG TERM GOAL #5   Title Pt will improve DGI to at least 22/24 for reduced risk of fall    Baseline 21/24 on 10/8    Time 6    Period Weeks    Status Achieved                 Plan -  10/04/20 1352    Clinical Impression Statement Treatment focused on reassessing pt's goals. Pt with improved DGI, Berg, and overall balance; however, pt remains highly challenged with SLS and tandem stance. This appears mostly likely due to decreased ankle strength/movement. At this time, Pt would benefit from a few more visits of PT to meet LTGs for safer balance and overall strength and mobility in the community and at home.    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 Add any more strengthening into HEP. Progress pt to work on SLS as able.    PT Home Exercise Plan Access Code: [email or web address]; added retro gait and 4-way ankle strengthening with green tband    Consulted and Agree with Plan of Care Patient           Patient will benefit from skilled therapeutic intervention in order to improve the following deficits and impairments:  Difficulty walking, Decreased balance, Improper body mechanics, Postural dysfunction, Decreased strength, Decreased mobility  Visit Diagnosis: Unsteadiness on feet - Plan: PT plan of care cert/re-cert  Muscle weakness (generalized) - Plan: PT plan of  care cert/re-cert  Abnormal posture - Plan: PT plan of care cert/re-cert     Problem List Patient Active Problem List   Diagnosis Date Noted  . Microalbuminuria due to type 2 diabetes mellitus ([place]) 05/25/2020  . Essential hypertension 06/05/2017  . Uncontrolled type 2 diabetes mellitus with hyperglycemia, with long-term current use of insulin ([place]) 02/28/2016  . Mixed hyperlipidemia 02/28/2016    Xitlalic Maslin April Ma L Savvy Peeters PT, DPT 10/04/2020, 2:36 PM  [facility] [address] [place], [place], [address] Phone: [phone]   Fax:  [phone]  Name: Joel Webb MRN: [id number] Date of Birth: October 19, 1946

## 2020-10-09 ENCOUNTER — Ambulatory Visit: Payer: Medicare HMO | Admitting: Dietician

## 2020-10-11 ENCOUNTER — Ambulatory Visit: Payer: Medicare HMO | Admitting: Physical Therapy

## 2020-10-11 ENCOUNTER — Other Ambulatory Visit: Payer: Self-pay

## 2020-10-11 DIAGNOSIS — R293 Abnormal posture: Secondary | ICD-10-CM | POA: Diagnosis not present

## 2020-10-11 DIAGNOSIS — M6281 Muscle weakness (generalized): Secondary | ICD-10-CM | POA: Diagnosis not present

## 2020-10-11 DIAGNOSIS — R2681 Unsteadiness on feet: Secondary | ICD-10-CM

## 2020-10-11 NOTE — Therapy (Signed)
[facility] [address] [place], [place], [address] Phone: [phone]   Fax:  [phone]  Physical Therapy Treatment  Patient Details  Name: Joel Webb MRN: [id number] Date of Birth: 10-15-1946 Referring Provider (PT): Antony Contras, MD   Encounter Date: 10/11/2020   PT End of Session - 10/11/20 1024    Visit Number 8    Number of Visits 11    Date for PT Re-Evaluation 10/06/20    Authorization Type [employer] M[employer] -- auth requested 10/8, needs 10th visit progress note    Progress Note Due on Visit 10    PT Start Time 1017    PT Stop Time 1100    PT Time Calculation (min) 43 min    Equipment Utilized During Treatment Gait belt    Activity Tolerance Patient tolerated treatment well    Behavior During Therapy [facility] for tasks assessed/performed           Past Medical History:  Diagnosis Date  . CKD (chronic kidney disease), stage III ([place])   . Depression   . Diabetes mellitus without complication ([place])   . ED (erectile dysfunction)   . GERD (gastroesophageal reflux disease)   . Hyperlipidemia   . Hypertension   . Plantar fasciitis   . Prostatitis     Past Surgical History:  Procedure Laterality Date  . CIRCUMCISION    . COLONOSCOPY    . MOUTH SURGERY  01/2020    There were no vitals filed for this visit.   Subjective Assessment - 10/11/20 1018    Subjective Pt states he concentrated on strengthening his L ankle more; the exercises are feeling good. He reports nothing else new to report.    Pertinent History COVID Jan 2021    How long can you sit comfortably? n/a    How long can you stand comfortably? n/a    How long can you walk comfortably? n/a    Diagnostic tests MRI brain (with and without) demonstrating:- Mild atrophy and mild chronic small vessel ischemic disease. - No acute findings.    Patient Stated Goals Improve balance and not hurt himself    Currently in Pain? No/denies                              [facility] Adult PT Treatment/Exercise - 10/11/20 0001      Knee/Hip Exercises: Standing   Hip Flexion Stengthening;Both;2 sets;10 reps;Knee straight    Hip Abduction Stengthening;Both;2 sets;10 reps;Knee straight    Abduction Limitations red tband    Hip Extension Stengthening;Both;2 sets;10 reps;Knee straight    Extension Limitations red tband    Wall Squat 2 sets;10 reps               Balance Exercises - 10/11/20 0001      Balance Exercises: Standing   Standing Eyes Opened Narrow base of support (BOS);Foam/compliant surface   2x10 turning to look over shoulder; 2x10 bowing   Tandem Stance Eyes open;Intermittent upper extremity support;30 secs;2 reps;Foam/compliant surface   30 sec on solid ground; 30 sec on foam   SLS with Vectors Foam/compliant surface;Intermittent upper extremity assist   tapping on targets forward & to the side x10 each   Retro Gait 5 reps   placing feet on widely spaced target              PT Short Term Goals - 08/25/20 1630      PT  SHORT TERM GOAL #1   Title STG = LTG             PT Long Term Goals - 10/04/20 1428      PT LONG TERM GOAL #1   Title Pt will be independent with advanced HEP to improve balance at home    Baseline Not provided    Time 6    Period Weeks    Status Achieved      PT LONG TERM GOAL #2   Title Pt will improve Berg Balance Score to at least 45/56 for reduced fall risk    Baseline 41/56 on 10/8; 50/56 on 10/04/20    Time 6    Period Weeks    Status Achieved      PT LONG TERM GOAL #3   Title Pt will be able to perform SLS for 5 sec to demonstrate improved functional ankle and hip stability (TARGET date 11/01/20)    Baseline 1 sec bilat    Time 4    Period Weeks    Status Revised    Target Date 11/01/20      PT LONG TERM GOAL #4   Title Pt will be able to perform 5x STS in <12 sec to indicate reduced fall risk and improved functional hip strength (TARGET DATE: 11/01/20)     Baseline 24.47 sec on 10/8; 15.94 sec on 10/04/20    Time 4    Period Weeks    Status Revised    Target Date 11/01/20      PT LONG TERM GOAL #5   Title Pt will improve DGI to at least 22/24 for reduced risk of fall    Baseline 21/24 on 10/8    Time 6    Period Weeks    Status Achieved                 Plan - 10/11/20 1023    Clinical Impression Statement Treatment focused on progressing pt's hip and LE strengthening exercises. PT continuing to challenge pt to improve SLS and tandem stance. Pt is progressing well toward PT goals.    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 Add any more strengthening into HEP. Progress pt to work on SLS as able.    PT Home Exercise Plan Access Code: [email or web address]; added retro gait and 4-way ankle strengthening with green tband    Consulted and Agree with Plan of Care Patient           Patient will benefit from skilled therapeutic intervention in order to improve the following deficits and impairments:  Difficulty walking, Decreased balance, Improper body mechanics, Postural dysfunction, Decreased strength, Decreased  mobility  Visit Diagnosis: Unsteadiness on feet  Muscle weakness (generalized)  Abnormal posture     Problem List Patient Active Problem List   Diagnosis Date Noted  . Microalbuminuria due to type 2 diabetes mellitus ([place]) 05/25/2020  . Essential hypertension 06/05/2017  .  Uncontrolled type 2 diabetes mellitus with hyperglycemia, with long-term current use of insulin ([place]) 02/28/2016  . Mixed hyperlipidemia 02/28/2016    Areg Bialas April Ma L Sherrey North PT, DPT 10/11/2020, 10:58 AM  [place] [facility] [address] [place], [place], [address] Phone: [phone]   Fax:  [phone]  Name: Joel Webb MRN: [id number] Date of Birth: 01/28/1946

## 2020-10-16 ENCOUNTER — Ambulatory Visit: Payer: Medicare HMO | Admitting: Diagnostic Neuroimaging

## 2020-10-16 ENCOUNTER — Encounter: Payer: Self-pay | Admitting: Diagnostic Neuroimaging

## 2020-10-16 VITALS — BP 145/86 | HR 92 | Ht 69.0 in | Wt 187.0 lb

## 2020-10-16 DIAGNOSIS — R4689 Other symptoms and signs involving appearance and behavior: Secondary | ICD-10-CM | POA: Diagnosis not present

## 2020-10-16 DIAGNOSIS — R413 Other amnesia: Secondary | ICD-10-CM | POA: Diagnosis not present

## 2020-10-16 DIAGNOSIS — R269 Unspecified abnormalities of gait and mobility: Secondary | ICD-10-CM

## 2020-10-16 DIAGNOSIS — R4189 Other symptoms and signs involving cognitive functions and awareness: Secondary | ICD-10-CM | POA: Diagnosis not present

## 2020-10-16 DIAGNOSIS — F329 Major depressive disorder, single episode, unspecified: Secondary | ICD-10-CM | POA: Diagnosis not present

## 2020-10-16 DIAGNOSIS — F32A Depression, unspecified: Secondary | ICD-10-CM

## 2020-10-16 NOTE — Patient Instructions (Signed)
  MEMORY LOSS / BEHAVIOR / PERSONALITY changes (could be related to depression, relational issues; onset of dementia also possible) - continue depression treatments per PCP; continue marital counseling - refer to neuropsychology testing

## 2020-10-16 NOTE — Progress Notes (Signed)
[facility]  PATIENT: Joel Webb DOB: Feb 14, 1946  REFERRING CLINICIAN: Antony Contras, MD HISTORY FROM: patient  REASON FOR VISIT: follow up   HISTORICAL  CHIEF COMPLAINT:  Chief Complaint  Patient presents with  . Gait Problem    rm 7 wife- Joel Webb "falls, balance issues"   . Memory Loss    MMSE 26    HISTORY OF PRESENT ILLNESS:   UPDATE (10/16/20, VRP): Since last visit, patient feels stable.  He feels like his memory balance difficulties are stable.  Patient's wife is very concerned about progressive attention and focus difficulty, leaving the stove on, bumping into the wall on the left side, bruising his left arm, zoning out, not paying attention to her conversations.  Continues to have issues with marital discord.  UPDATE (06/12/20, VRP): Since last visit, doing well. Symptoms are improved. Severity is mild. No alleviating or aggravating factors. Tolerating meds. Memory and gait improved. Now exercising at [facility] 4x per week and feeling stronger.   UPDATE (02/28/20, Dr. Nicole Kindred): "I met with Joel Webb to review the findings resulting from his neuropsychological evaluation. Since the last appointment, he has been about the same. Time was spent reviewing the impressions and recommendations that are detailed in the evaluation report. I explained that int he present case, we do not have enough for a diagnosis of frank cognitive impairment and that I think depression, relational issues, and other more common reversible factors are the likely causes of Joel Webb difficulties. I counseled them about things to watch out for in FTD and was candid about the fact that the condition is often misdiagnosed as depression or relationship distress. We now have a strong baseline against which to compare Joel Webb future performance. Interventions provided during this encounter included psychoeducation, and other topics as reflected in the patient instructions. I took  time to explain the findings and answer all the patient's questions. I encouraged Joel Webb to contact me should he have any further questions or if further follow up is desired."  UPDATE (01/26/20, VRP): Since last visit, doing about the same. Symptoms are persistent. Severity is moderate per patient. No alleviating or aggravating factors. Wife still notes memory and personality changes.  PRIOR HPI (12/22/19): 74 year old male here for evaluation of gait and balance difficulty.  History of diabetes, hypercholesterolemia, high blood pressure.  For past 6 to 8 months patient has had sensation of stumbling, tripping, numbness in feet, left foot incoordination.  Symptoms worsening over time.  He has more difficulty with steps and uneven surfaces.  He notices problem in his left foot.  Has had some back pain in the past.  Wife also notes at least 1 year of memory loss, personality and mood changes, irritable, paranoid, forgetting anniversary dates, forgetting medications and appointments, leaving stove on.  Patient and wife had COVID in Jan 2021.    REVIEW OF SYSTEMS: Full 14 system review of systems performed and negative with exception of: As per HPI.  ALLERGIES: No Known Allergies  HOME MEDICATIONS: Outpatient Medications Prior to Visit  Medication Sig Dispense Refill  . amLODipine (NORVASC) 5 MG tablet     . atorvastatin (LIPITOR) 80 MG tablet Take 80 mg by mouth daily.    . BD INSULIN SYRINGE U/F 31G X 5/16" 1 ML MISC USE TO GIVE 2 INSULIN INJECTIONS DAILY 200 each 1  . Dulaglutide (TRULICITY) 1.5 ZL/9.3TT SOPN INJECT 1.5 MG UNDER THE SKIN ONCE WEEKLY 2 mL 3  . DULoxetine (CYMBALTA) 60 MG  capsule 60 mg daily.    . finasteride (PROSCAR) 5 MG tablet     . glucose blood (ACCU-CHEK SMARTVIEW) test strip Use to check blood sugar 1 time per day. 50 each 3  . insulin degludec (TRESIBA FLEXTOUCH) 200 UNIT/ML FlexTouch Pen INJECT 40 UNITS UNDER THE SKIN DAILY 15 mL 1  . Insulin Pen Needle (PEN  NEEDLES) 31G X 6 MM MISC Use to inject insulin daily. 100 each 2  . insulin regular (NOVOLIN R RELION) 100 units/mL injection Inject 0.08 mLs (8 Units total) into the skin 2 (two) times daily before a meal. Inject 10 units under the skin once daily at supper (Patient taking differently: Inject 8 Units into the skin 2 (two) times daily before a meal. Patient injecting 8 units before lunch and 10 units before supper) 10 mL 3  . losartan-hydrochlorothiazide (HYZAAR) 100-12.5 MG per tablet Take 1 tablet by mouth daily.    . metFORMIN (GLUCOPHAGE) 1000 MG tablet TAKE ONE TABLET BY MOUTH TWICE A DAY WITH MEALS 180 tablet 0  . Multiple Vitamin (MULTIVITAMIN WITH MINERALS) TABS tablet Take 1 tablet by mouth daily.    . Omega-3 Fatty Acids (FISH OIL) 1000 MG CAPS Take by mouth. 2x/day    . omeprazole (PRILOSEC) 20 MG capsule Take 20 mg by mouth every other day. PRN    . oxybutynin (DITROPAN-XL) 5 MG 24 hr tablet 06/12/20 not started    . tamsulosin (FLOMAX) 0.4 MG CAPS capsule Take 0.4 mg by mouth. Reported on 05/29/2016    . UNABLE TO FIND Med Name: Prevagen     No facility-administered medications prior to visit.    PAST MEDICAL HISTORY: Past Medical History:  Diagnosis Date  . CKD (chronic kidney disease), stage III ([place])   . Depression   . Diabetes mellitus without complication ([place])   . ED (erectile dysfunction)   . GERD (gastroesophageal reflux disease)   . Hyperlipidemia   . Hypertension   . Plantar fasciitis   . Prostatitis     PAST SURGICAL HISTORY: Past Surgical History:  Procedure Laterality Date  . CIRCUMCISION    . COLONOSCOPY    . MOUTH SURGERY  01/2020    FAMILY HISTORY: Family History  Problem Relation Age of Onset  . Hepatitis C Mother   . Mesothelioma Father   . Diabetes Father   . Heart disease Father     SOCIAL HISTORY: Social History   Socioeconomic History  . Marital status: Married    Spouse name: Joel Webb  . Number of children: 1  . Years of education: Not  on file  . Highest education level: Bachelor's degree (e.g., BA, AB, BS)  Occupational History    Comment: sales rep, retired  Tobacco Use  . Smoking status: Former Smoker    Quit date: 12/20/2008    Years since quitting: 11.8  . Smokeless tobacco: Never Used  Substance and Sexual Activity  . Alcohol use: Yes    Comment: rarely  . Drug use: No  . Sexual activity: Not on file  Other Topics Concern  . Not on file  Social History Narrative   01/26/20 Lives with wife   Caffeine, none   Social Determinants of Health   Financial Resource Strain:   . Difficulty of Paying Living Expenses: Not on file  Food Insecurity:   . Worried About Charity fundraiser in the Last Year: Not on file  . Ran Out of Food in the Last Year: Not on file  Transportation  Needs:   . Lack of Transportation (Medical): Not on file  . Lack of Transportation (Non-Medical): Not on file  Physical Activity:   . Days of Exercise per Week: Not on file  . Minutes of Exercise per Session: Not on file  Stress:   . Feeling of Stress : Not on file  Social Connections:   . Frequency of Communication with Friends and Family: Not on file  . Frequency of Social Gatherings with Friends and Family: Not on file  . Attends Religious Services: Not on file  . Active Member of Clubs or Organizations: Not on file  . Attends Archivist Meetings: Not on file  . Marital Status: Not on file  Intimate Partner Violence:   . Fear of Current or Ex-Partner: Not on file  . Emotionally Abused: Not on file  . Physically Abused: Not on file  . Sexually Abused: Not on file     PHYSICAL EXAM  GENERAL EXAM/CONSTITUTIONAL: Vitals:  Vitals:   10/16/20 1616  BP: (!) 145/86  Pulse: 92  Weight: 187 lb (84.8 kg)  Height: _0  (1.753 m)   Body mass index is 27.62 kg/m. Wt Readings from Last 3 Encounters:  10/16/20 187 lb (84.8 kg)  08/11/20 189 lb (85.7 kg)  07/10/20 187 lb (84.8 kg)    Patient is in no distress; well  developed, nourished and groomed; neck is supple  CARDIOVASCULAR:  Examination of carotid arteries is normal; no carotid bruits  Regular rate and rhythm, no murmurs  Examination of peripheral vascular system by observation and palpation is normal  EYES:  Ophthalmoscopic exam of optic discs and posterior segments is normal; no papilledema or hemorrhages No exam data present  MUSCULOSKELETAL:  Gait, strength, tone, movements noted in Neurologic exam below  NEUROLOGIC: MENTAL STATUS:  MMSE - [place] Exam 10/16/2020 01/26/2020  Orientation to time 4 4  Orientation to Place 4 4  Registration 2 3  Attention/ Calculation 5 5  Recall 3 3  Language- name 2 objects 2 2  Language- repeat 0 1  Language- follow 3 step command 3 3  Language- read & follow direction 1 1  Write a sentence 1 1  Copy design 1 1  Total score 26 28    awake, alert, oriented to person, place and time  recent and remote memory intact  normal attention and concentration  language fluent, comprehension intact, naming intact  fund of knowledge appropriate  CRANIAL NERVE:   2nd - no papilledema on fundoscopic exam  2nd, 3rd, 4th, 6th - pupils equal and reactive to light, visual fields full to confrontation, extraocular muscles intact, no nystagmus  5th - facial sensation symmetric  7th - facial strength symmetric  8th - hearing intact  9th - palate elevates symmetrically, uvula midline  11th - shoulder shrug symmetric  12th - tongue protrusion midline  MOTOR:   normal bulk and tone, full strength in the BUE, BLE  SENSORY:   normal and symmetric to light touch, temperature, vibration  COORDINATION:   finger-nose-finger, fine finger movements NORMAL  REFLEXES:   deep tendon reflexes --> BUE 2, KNEES 2, ANKLES 1  GAIT/STATION:   narrow based gait     DIAGNOSTIC DATA (LABS, IMAGING, TESTING) - I reviewed patient records, labs, notes, testing and imaging myself where  available.  Lab Results  Component Value Date   WBC 6.8 02/02/2016      Component Value Date/Time   NA 139 05/23/2020 0802   NA  137 02/02/2016 0000   K 4.5 05/23/2020 0802   CL 104 05/23/2020 0802   CO2 27 05/23/2020 0802   GLUCOSE 152 (H) 08/08/2020 0854   BUN 25 (H) 05/23/2020 0802   BUN 33 (A) 02/02/2016 0000   CREATININE 1.22 05/23/2020 0802   CALCIUM 9.2 05/23/2020 0802   PROT 6.3 05/23/2020 0802   ALBUMIN 3.9 05/23/2020 0802   AST 23 05/23/2020 0802   ALT 27 05/23/2020 0802   ALKPHOS 85 05/23/2020 0802   BILITOT 0.4 05/23/2020 0802   Lab Results  Component Value Date   CHOL 129 12/24/2016   HDL 23.70 (L) 12/24/2016   LDLCALC 97 02/02/2016   LDLDIRECT 84.0 04/07/2020   TRIG 210.0 (H) 12/24/2016   CHOLHDL 5 12/24/2016   Lab Results  Component Value Date   HGBA1C 7.7 (H) 05/23/2020   Lab Results  Component Value Date   VITAMINB12 541 12/22/2019   Lab Results  Component Value Date   TSH 1.71 02/02/2016    01/12/20 MRI brain [I reviewed images myself and agree with interpretation. -VRP]  - Mild atrophy and mild chronic small vessel ischemic disease. - No acute findings.  01/12/20 MRI cervical spine [I reviewed images myself and agree with interpretation. -VRP]  - At C3-4: disc bulging and facet hypertrophy with mild spinal stenosis and severe biforaminal stenosis. - At C2-3: disc bulging and facet hypertrophy with moderate biforaminal stenosis.      ASSESSMENT AND PLAN  74 y.o. year old male here with mild gait and balance difficulty, incoordination, memory loss, personality mood changes.  Doing better with counseling and exercises.  Dx:  1. Memory loss   2. Gait difficulty   3. Cognitive and behavioral changes   4. Depressive disorder      PLAN:  MEMORY LOSS / BEHAVIOR / PERSONALITY changes (could be related to depression, relational issues; onset of FTD also possible) - continue depression treatments per PCP; continue marital counseling -  follow up to neuropsychology testing (last testing April 2021; plan to repeat in April 2022)  MILD GAIT DIFFICULTY (some mild diabetic neuropathy) - improving; doing well with exercises at the [facility]  Orders Placed This Encounter  Procedures  . Ambulatory referral to Neuropsychology   Return for pending if symptoms worsen or fail to improve.    Penni Bombard, MD 32/20/2542, 7:06 PM Certified in Neurology, Neurophysiology and Neuroimaging  [facility] [address], [place], [place] [address] [phone]

## 2020-10-17 DIAGNOSIS — F329 Major depressive disorder, single episode, unspecified: Secondary | ICD-10-CM | POA: Diagnosis not present

## 2020-10-17 DIAGNOSIS — E119 Type 2 diabetes mellitus without complications: Secondary | ICD-10-CM | POA: Diagnosis not present

## 2020-10-17 DIAGNOSIS — I10 Essential (primary) hypertension: Secondary | ICD-10-CM | POA: Diagnosis not present

## 2020-10-17 DIAGNOSIS — N402 Nodular prostate without lower urinary tract symptoms: Secondary | ICD-10-CM | POA: Diagnosis not present

## 2020-10-17 DIAGNOSIS — E1122 Type 2 diabetes mellitus with diabetic chronic kidney disease: Secondary | ICD-10-CM | POA: Diagnosis not present

## 2020-10-17 DIAGNOSIS — N400 Enlarged prostate without lower urinary tract symptoms: Secondary | ICD-10-CM | POA: Diagnosis not present

## 2020-10-17 DIAGNOSIS — K219 Gastro-esophageal reflux disease without esophagitis: Secondary | ICD-10-CM | POA: Diagnosis not present

## 2020-10-17 DIAGNOSIS — E782 Mixed hyperlipidemia: Secondary | ICD-10-CM | POA: Diagnosis not present

## 2020-10-17 DIAGNOSIS — F339 Major depressive disorder, recurrent, unspecified: Secondary | ICD-10-CM | POA: Diagnosis not present

## 2020-10-18 ENCOUNTER — Ambulatory Visit: Payer: Medicare HMO | Admitting: Physical Therapy

## 2020-10-19 ENCOUNTER — Ambulatory Visit: Payer: Medicare HMO | Attending: Family Medicine | Admitting: Physical Therapy

## 2020-10-19 ENCOUNTER — Other Ambulatory Visit: Payer: Self-pay

## 2020-10-19 DIAGNOSIS — R293 Abnormal posture: Secondary | ICD-10-CM | POA: Diagnosis not present

## 2020-10-19 DIAGNOSIS — M6281 Muscle weakness (generalized): Secondary | ICD-10-CM | POA: Insufficient documentation

## 2020-10-19 DIAGNOSIS — R2681 Unsteadiness on feet: Secondary | ICD-10-CM | POA: Diagnosis not present

## 2020-10-19 NOTE — Therapy (Signed)
[facility] [address] [place], [place], [address] Phone: [phone]   Fax:  [phone]  Physical Therapy Treatment  Patient Details  Name: Joel Webb MRN: [id number] Date of Birth: May 10, 1946 Referring Provider (PT): Antony Contras, MD   Encounter Date: 10/19/2020   PT End of Session - 10/19/20 1757    Visit Number 9    Number of Visits 11    Date for PT Re-Evaluation 11/01/20    Authorization Type [employer] M[employer] -- auth requested 10/8, needs 10th visit progress note    Progress Note Due on Visit 10    PT Start Time 1445    PT Stop Time 1530    PT Time Calculation (min) 45 min    Equipment Utilized During Treatment Gait belt    Activity Tolerance Patient tolerated treatment well    Behavior During Therapy [facility] for tasks assessed/performed           Past Medical History:  Diagnosis Date  . CKD (chronic kidney disease), stage III ([place])   . Depression   . Diabetes mellitus without complication ([place])   . ED (erectile dysfunction)   . GERD (gastroesophageal reflux disease)   . Hyperlipidemia   . Hypertension   . Plantar fasciitis   . Prostatitis     Past Surgical History:  Procedure Laterality Date  . CIRCUMCISION    . COLONOSCOPY    . MOUTH SURGERY  01/2020    There were no vitals filed for this visit.   Subjective Assessment - 10/19/20 1451    Subjective Pt states [place] since the last time he saw PT. Pt reports he's been sore but has not noticed any LOB. He reports increased tightness from standing all day.    Pertinent History COVID Jan 2021    How long can you sit comfortably? n/a    How long can you stand comfortably? n/a    How long can you walk comfortably? n/a    Diagnostic tests MRI brain (with and without) demonstrating:- Mild atrophy and mild chronic small vessel ischemic disease. - No acute findings.    Patient Stated Goals Improve balance and  not hurt himself    Currently in Pain? No/denies                             [facility] Adult PT Treatment/Exercise - 10/19/20 0001      Knee/Hip Exercises: Stretches   Passive Hamstring Stretch Both;30 seconds    Quad Stretch Both;30 seconds    Hip Flexor Stretch Both;30 seconds    Piriformis Stretch 30 seconds;Both    Gastroc Stretch Both;30 seconds               Balance Exercises - 10/19/20 0001      Balance Exercises: Standing   SLS Eyes open;Solid surface;3 reps;30 secs;Intermittent upper extremity support   10 sec on R LE; 6 sec on L LE max; improved stability after tactile cues for weight shift   SLS with Vectors Solid surface;Foam/compliant surface;Intermittent upper extremity assist   tapping forward on cone & to the side on cone 2x10 each; tapping on 2 cones forward x10 bilat   Retro Gait --   10'x 6 cues for equal step length and weight shift   Other Standing Exercises [place] at counter 4x25'  PT Short Term Goals - 08/25/20 1630      PT SHORT TERM GOAL #1   Title STG = LTG             PT Long Term Goals - 10/04/20 1428      PT LONG TERM GOAL #1   Title Pt will be independent with advanced HEP to improve balance at home    Baseline Not provided    Time 6    Period Weeks    Status Achieved      PT LONG TERM GOAL #2   Title Pt will improve Berg Balance Score to at least 45/56 for reduced fall risk    Baseline 41/56 on 10/8; 50/56 on 10/04/20    Time 6    Period Weeks    Status Achieved      PT LONG TERM GOAL #3   Title Pt will be able to perform SLS for 5 sec to demonstrate improved functional ankle and hip stability (TARGET date 11/01/20)    Baseline 1 sec bilat    Time 4    Period Weeks    Status Revised    Target Date 11/01/20      PT LONG TERM GOAL #4   Title Pt will be able to perform 5x STS in <12 sec to indicate reduced fall risk and improved functional hip strength (TARGET DATE: 11/01/20)    Baseline  24.47 sec on 10/8; 15.94 sec on 10/04/20    Time 4    Period Weeks    Status Revised    Target Date 11/01/20      PT LONG TERM GOAL #5   Title Pt will improve DGI to at least 22/24 for reduced risk of fall    Baseline 21/24 on 10/8    Time 6    Period Weeks    Status Achieved                 Plan - 10/19/20 1758    Clinical Impression Statement Treatment focused on improving pt's SLS, weightshifting, balance, and bilat hip strength. Pt with LE tightness from working -- provided pt with general LE stretches. Pt is able to demonstrate 5-10 sec of SLS although still inconsistent. He is able to improve his weight shifting and maintaining weight shift for improving dynamic motion. Pt is progressing well towards PT goals.    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 Add any more strengthening into HEP. Progress pt to work on SLS as able.    PT Home Exercise Plan Access Code: [email or web address]; added retro gait and 4-way ankle strengthening with green tband    Consulted and Agree with Plan of Care Patient           Patient will benefit from skilled therapeutic intervention  in order to improve the following deficits and impairments:  Difficulty walking, Decreased balance, Improper body mechanics, Postural dysfunction, Decreased strength, Decreased  mobility  Visit Diagnosis: Unsteadiness on feet  Muscle weakness (generalized)  Abnormal posture     Problem List Patient Active Problem List   Diagnosis Date Noted  . Microalbuminuria due to type 2 diabetes mellitus ([place]) 05/25/2020  . Essential hypertension 06/05/2017  . Uncontrolled type 2 diabetes mellitus with hyperglycemia, with long-term current use of insulin ([place]) 02/28/2016  . Mixed hyperlipidemia 02/28/2016    [facility] April Ma L Jaianna Nicoll PT, DPT 10/19/2020, 6:02 PM  [facility] [address] [place], [place], [address] Phone: [phone]   Fax:  [phone]  Name: Joel Webb MRN: [id number] Date of Birth: 17-Aug-1946

## 2020-10-24 ENCOUNTER — Encounter: Payer: Self-pay | Admitting: Neurology

## 2020-10-25 ENCOUNTER — Ambulatory Visit: Payer: Medicare HMO | Admitting: Physical Therapy

## 2020-10-26 DIAGNOSIS — R059 Cough, unspecified: Secondary | ICD-10-CM | POA: Diagnosis not present

## 2020-10-26 DIAGNOSIS — J018 Other acute sinusitis: Secondary | ICD-10-CM | POA: Diagnosis not present

## 2020-10-26 DIAGNOSIS — Z03818 Encounter for observation for suspected exposure to other biological agents ruled out: Secondary | ICD-10-CM | POA: Diagnosis not present

## 2020-10-26 DIAGNOSIS — B349 Viral infection, unspecified: Secondary | ICD-10-CM | POA: Diagnosis not present

## 2020-10-27 ENCOUNTER — Ambulatory Visit: Payer: Medicare HMO | Admitting: Physical Therapy

## 2020-10-27 ENCOUNTER — Other Ambulatory Visit: Payer: Self-pay | Admitting: Endocrinology

## 2020-10-30 ENCOUNTER — Other Ambulatory Visit: Payer: Self-pay | Admitting: *Deleted

## 2020-10-30 MED ORDER — TRULICITY 1.5 MG/0.5ML ~~LOC~~ SOAJ
SUBCUTANEOUS | 1 refills | Status: DC
Start: 2020-10-30 — End: 2021-03-29

## 2020-11-01 ENCOUNTER — Ambulatory Visit: Payer: Medicare HMO | Admitting: Physical Therapy

## 2020-11-02 ENCOUNTER — Other Ambulatory Visit: Payer: Self-pay

## 2020-11-02 ENCOUNTER — Ambulatory Visit: Payer: Medicare HMO | Admitting: Physical Therapy

## 2020-11-02 DIAGNOSIS — E1122 Type 2 diabetes mellitus with diabetic chronic kidney disease: Secondary | ICD-10-CM | POA: Diagnosis not present

## 2020-11-02 DIAGNOSIS — R293 Abnormal posture: Secondary | ICD-10-CM | POA: Diagnosis not present

## 2020-11-02 DIAGNOSIS — N400 Enlarged prostate without lower urinary tract symptoms: Secondary | ICD-10-CM | POA: Diagnosis not present

## 2020-11-02 DIAGNOSIS — R2681 Unsteadiness on feet: Secondary | ICD-10-CM | POA: Diagnosis not present

## 2020-11-02 DIAGNOSIS — N1831 Chronic kidney disease, stage 3a: Secondary | ICD-10-CM | POA: Diagnosis not present

## 2020-11-02 DIAGNOSIS — I10 Essential (primary) hypertension: Secondary | ICD-10-CM | POA: Diagnosis not present

## 2020-11-02 DIAGNOSIS — D692 Other nonthrombocytopenic purpura: Secondary | ICD-10-CM | POA: Diagnosis not present

## 2020-11-02 DIAGNOSIS — K219 Gastro-esophageal reflux disease without esophagitis: Secondary | ICD-10-CM | POA: Diagnosis not present

## 2020-11-02 DIAGNOSIS — F329 Major depressive disorder, single episode, unspecified: Secondary | ICD-10-CM | POA: Diagnosis not present

## 2020-11-02 DIAGNOSIS — Z794 Long term (current) use of insulin: Secondary | ICD-10-CM | POA: Diagnosis not present

## 2020-11-02 DIAGNOSIS — M6281 Muscle weakness (generalized): Secondary | ICD-10-CM

## 2020-11-02 DIAGNOSIS — G319 Degenerative disease of nervous system, unspecified: Secondary | ICD-10-CM | POA: Diagnosis not present

## 2020-11-02 DIAGNOSIS — N402 Nodular prostate without lower urinary tract symptoms: Secondary | ICD-10-CM | POA: Diagnosis not present

## 2020-11-02 DIAGNOSIS — F339 Major depressive disorder, recurrent, unspecified: Secondary | ICD-10-CM | POA: Diagnosis not present

## 2020-11-02 DIAGNOSIS — E782 Mixed hyperlipidemia: Secondary | ICD-10-CM | POA: Diagnosis not present

## 2020-11-02 DIAGNOSIS — E119 Type 2 diabetes mellitus without complications: Secondary | ICD-10-CM | POA: Diagnosis not present

## 2020-11-02 NOTE — Therapy (Addendum)
[facility] [address] [place], [place], [address] Phone: [phone]   Fax:  [phone]  Physical Therapy Treatment, Progress Note and Discharge  Patient Details  Name: Joel Webb MRN: [id number] Date of Birth: [birth year] Referring Provider (PT): Antony Contras, MD  Progress Note Reporting Period 08/25/2020 to 11/02/2020  See note below for Objective Data and Assessment of Progress/Goals.       Encounter Date: 11/02/2020   PT End of Session - 11/02/20 1411    Visit Number 10    Number of Visits 11    Date for PT Re-Evaluation 11/01/20    Authorization Type [employer] M[employer] -- auth requested 10/8, needs 10th visit progress note    Progress Note Due on Visit 10    PT Start Time 1320    PT Stop Time 1400    PT Time Calculation (min) 40 min    Equipment Utilized During Treatment Gait belt    Activity Tolerance Patient tolerated treatment well    Behavior During Therapy WFL for tasks assessed/performed           Past Medical History:  Diagnosis Date  . CKD (chronic kidney disease), stage III ([place])   . Depression   . Diabetes mellitus without complication ([place])   . ED (erectile dysfunction)   . GERD (gastroesophageal reflux disease)   . Hyperlipidemia   . Hypertension   . Plantar fasciitis   . Prostatitis     Past Surgical History:  Procedure Laterality Date  . CIRCUMCISION    . COLONOSCOPY    . MOUTH SURGERY  01/2020    There were no vitals filed for this visit.   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 Pt states he's been doing okay. Pt reports no falls or LOBs. Pt states he's not able to do the exercises as much as he would like due to his work schedule.    Pertinent History COVID Jan 2021    How long can you sit comfortably? n/a    How long can you stand comfortably? n/a    How long can you walk comfortably? n/a     Diagnostic tests MRI brain (with and without) demonstrating:- Mild atrophy and mild chronic small vessel ischemic disease. - No acute findings.    Patient Stated Goals Improve balance and not hurt himself    Currently in Pain? No/denies              [facility] PT Assessment - 11/02/20 0001      Single Leg Stance   Comments 12.26 sec on R; 9.48 sec on L      Dynamic Gait Index   Level Surface Normal    Change in Gait Speed Normal    Gait with Horizontal Head Turns Normal    Gait with Vertical Head Turns Normal    Gait and Pivot Turn Normal    Step Over Obstacle Normal    Step Around Obstacles Normal    Steps Normal    Total Score 24    DGI comment: 24/24                         [facility] Adult PT Treatment/Exercise - 11/02/20 0001      Ambulation/Gait   Ambulation Distance (Feet) 330 Feet    Assistive device None    Gait Pattern Step-through pattern  Ambulation Surface Level;Indoor    Gait velocity 6.46 sec = 5 ft/sec    Stairs Yes    Stairs Assistance 7: Independent    Stair Management Technique No rails;Alternating pattern    Number of Stairs 4    Height of Stairs 6      Knee/Hip Exercises: Stretches   Passive Hamstring Stretch Both;30 seconds    Quad Stretch Both;30 seconds    Hip Flexor Stretch Both;30 seconds    Piriformis Stretch 30 seconds;Both    Gastroc Stretch Both;30 seconds      Knee/Hip Exercises: Standing   Wall Squat 2 sets;10 reps    SLS 2x20 sec on L    Other Standing Knee Exercises SL mini squat at counter 3x10    Other Standing Knee Exercises [place] at counter 2x10 bilat               Balance Exercises - 11/02/20 0001      Balance Exercises: Standing   Tandem Gait Forward;4 reps;Intermittent upper extremity support   decreased L LE coordination              PT Short Term Goals - 08/25/20 1630      PT SHORT TERM GOAL #1   Title STG = LTG             PT Long Term Goals - 11/02/20 1322      PT LONG TERM  GOAL #1   Title Pt will be independent with advanced HEP to improve balance at home    Baseline Not provided    Time 6    Period Weeks    Status Achieved      PT LONG TERM GOAL #2   Title Pt will improve Berg Balance Score to at least 45/56 for reduced fall risk    Baseline 41/56 on 10/8; 50/56 on 10/04/20    Time 6    Period Weeks    Status Achieved      PT LONG TERM GOAL #3   Title Pt will be able to perform SLS for 5 sec to demonstrate improved functional ankle and hip stability (TARGET date 11/01/20)    Baseline 1 sec bilat; 10 sec bilat 11/02/20    Time 4    Period Weeks    Status Achieved      PT LONG TERM GOAL #4   Title Pt will be able to perform 5x STS in <12 sec to indicate reduced fall risk and improved functional hip strength (TARGET DATE: 11/01/20)    Baseline 24.47 sec on 10/8; 15.94 sec on 10/04/20; 14.28 sec on 11/02/2020    Time 4    Period Weeks    Status Partially Met      PT LONG TERM GOAL #5   Title Pt will improve DGI to at least 22/24 for reduced risk of fall    Baseline 21/24 on 10/8; 24/24 on 11/02/20    Time 6    Period Weeks    Status Achieved                 Plan - 11/02/20 1412    Clinical Impression Statement Treatment focused on reassessing pt's goals. Pt has met LTGs #1, #2, #3, and #5. He has partially met LTG #4. Pt able to maintain SLS on L LE >5 sec although inconsistent. R LE is consistently >10 sec. Pt demos improved stability overall but still has deficits with L LE proprioception and decreased coordination. Pt given advanced  HEP to continue to work and progress this at home. Reviewed pt's stretching as he has been getting increased tightness Pt is ready for PT d/c.    Personal Factors and Comorbidities Age;Time since onset of injury/illness/exacerbation    Examination-Activity Limitations Locomotion Level;Stairs;Squat    Examination-Participation Restrictions Community Activity;Occupation    Stability/Clinical Decision Making  Stable/Uncomplicated    Rehab Potential Good    PT Frequency 1x / week    PT Duration 4 weeks    PT Treatment/Interventions ADLs/Self Care Home Management;Cryotherapy;Electrical Stimulation;Iontophoresis 32m/ml Dexamethasone;Moist Heat;Gait training;Stair training;Functional mobility training;Therapeutic activities;Therapeutic exercise;Balance training;Neuromuscular re-education;Patient/family education;Manual techniques;Passive range of motion;Vestibular    PT Next Visit Plan Continue ankle strengthening/challenges on foam with vision removed, dynamic gait & balance. Add any more strengthening into HEP. Progress pt to work on SLS as able.    PT Home Exercise Plan Access Code: [email or web address] his strengthening program; Access Code [email or web address] for his stretching program           Patient will benefit from skilled therapeutic intervention in order to improve the following deficits and impairments:  Difficulty walking,Decreased balance,Improper body mechanics,Postural dysfunction,Decreased strength,Decreased mobility  Visit Diagnosis: Unsteadiness on feet  Muscle weakness (generalized)  Abnormal posture     Problem List Patient Active Problem List   Diagnosis Date Noted  . Microalbuminuria due to type 2 diabetes mellitus ([place] 05/25/2020  . Essential hypertension 06/05/2017  . Uncontrolled type 2 diabetes mellitus with hyperglycemia, with long-term current use of insulin ([place] 02/28/2016  . Mixed hyperlipidemia 02/28/2016     PHYSICAL THERAPY DISCHARGE SUMMARY  Visits from Start of Care: 10  Current functional level related to goals / functional outcomes: See above   Remaining deficits: Decreased L LE coordination and proprioception affecting L LE SLS.    Education / Equipment: Discussed his continued deficits and provided advanced HEP  Plan: Patient agrees to discharge.  Patient goals were met. Patient is being discharged due to meeting the stated rehab goals.  ?????          Lyberti Thrush [address]Constantinos Krempasky PT, DPT 11/02/2020, 2:33 PM   [id number] [address]Phone: [phone]  Fax:  [phone] Name: Joel VANDERHOEFMRN: [id number] of Birth: 116-Nov-1947

## 2020-11-07 ENCOUNTER — Other Ambulatory Visit (INDEPENDENT_AMBULATORY_CARE_PROVIDER_SITE_OTHER): Payer: Medicare HMO

## 2020-11-07 ENCOUNTER — Other Ambulatory Visit: Payer: Self-pay

## 2020-11-07 DIAGNOSIS — Z794 Long term (current) use of insulin: Secondary | ICD-10-CM | POA: Diagnosis not present

## 2020-11-07 DIAGNOSIS — E1165 Type 2 diabetes mellitus with hyperglycemia: Secondary | ICD-10-CM | POA: Diagnosis not present

## 2020-11-07 LAB — COMPREHENSIVE METABOLIC PANEL
ALT: 23 U/L (ref 0–53)
AST: 22 U/L (ref 0–37)
Albumin: 4 g/dL (ref 3.5–5.2)
Alkaline Phosphatase: 94 U/L (ref 39–117)
BUN: 28 mg/dL — ABNORMAL HIGH (ref 6–23)
CO2: 28 mEq/L (ref 19–32)
Calcium: 9.1 mg/dL (ref 8.4–10.5)
Chloride: 106 mEq/L (ref 96–112)
Creatinine, Ser: 1.37 mg/dL (ref 0.40–1.50)
GFR: 50.97 mL/min — ABNORMAL LOW (ref >60.00)
Glucose, Bld: 147 mg/dL — ABNORMAL HIGH (ref 70–99)
Potassium: 4.8 mEq/L (ref 3.5–5.1)
Sodium: 139 mEq/L (ref 135–145)
Total Bilirubin: 0.4 mg/dL (ref 0.2–1.2)
Total Protein: 6.8 g/dL (ref 6.0–8.3)

## 2020-11-07 LAB — HEMOGLOBIN A1C: Hgb A1c MFr Bld: 6.7 % — ABNORMAL HIGH (ref 4.6–6.5)

## 2020-11-13 ENCOUNTER — Ambulatory Visit: Payer: Self-pay | Admitting: Endocrinology

## 2020-11-15 DIAGNOSIS — Z20822 Contact with and (suspected) exposure to covid-19: Secondary | ICD-10-CM | POA: Diagnosis not present

## 2020-11-26 ENCOUNTER — Other Ambulatory Visit: Payer: Self-pay | Admitting: Endocrinology

## 2020-11-26 DIAGNOSIS — E1165 Type 2 diabetes mellitus with hyperglycemia: Secondary | ICD-10-CM

## 2020-11-28 ENCOUNTER — Other Ambulatory Visit: Payer: Self-pay

## 2020-11-28 ENCOUNTER — Other Ambulatory Visit (INDEPENDENT_AMBULATORY_CARE_PROVIDER_SITE_OTHER): Payer: Medicare HMO

## 2020-11-28 DIAGNOSIS — Z794 Long term (current) use of insulin: Secondary | ICD-10-CM | POA: Diagnosis not present

## 2020-11-28 DIAGNOSIS — E1165 Type 2 diabetes mellitus with hyperglycemia: Secondary | ICD-10-CM | POA: Diagnosis not present

## 2020-11-28 LAB — BASIC METABOLIC PANEL
BUN: 31 mg/dL — ABNORMAL HIGH (ref 6–23)
CO2: 27 mEq/L (ref 19–32)
Calcium: 10 mg/dL (ref 8.4–10.5)
Chloride: 102 mEq/L (ref 96–112)
Creatinine, Ser: 1.4 mg/dL (ref 0.40–1.50)
GFR: 49.65 mL/min — ABNORMAL LOW (ref >60.00)
Glucose, Bld: 127 mg/dL — ABNORMAL HIGH (ref 70–99)
Potassium: 4.8 mEq/L (ref 3.5–5.1)
Sodium: 137 mEq/L (ref 135–145)

## 2020-11-28 LAB — MICROALBUMIN / CREATININE URINE RATIO
Creatinine,U: 90.1 mg/dL
Microalb Creat Ratio: 33.3 mg/g — ABNORMAL HIGH (ref 0.0–30.0)
Microalb, Ur: 30 mg/dL — ABNORMAL HIGH (ref 0.0–1.9)

## 2020-11-30 ENCOUNTER — Telehealth: Payer: Self-pay | Admitting: Endocrinology

## 2020-11-30 ENCOUNTER — Other Ambulatory Visit: Payer: Self-pay

## 2020-11-30 ENCOUNTER — Ambulatory Visit: Payer: Medicare HMO | Admitting: Endocrinology

## 2020-11-30 ENCOUNTER — Encounter: Payer: Self-pay | Admitting: Endocrinology

## 2020-11-30 VITALS — BP 132/78 | HR 98 | Wt 187.6 lb

## 2020-11-30 DIAGNOSIS — E1129 Type 2 diabetes mellitus with other diabetic kidney complication: Secondary | ICD-10-CM

## 2020-11-30 DIAGNOSIS — Z794 Long term (current) use of insulin: Secondary | ICD-10-CM

## 2020-11-30 DIAGNOSIS — I10 Essential (primary) hypertension: Secondary | ICD-10-CM | POA: Diagnosis not present

## 2020-11-30 DIAGNOSIS — R809 Proteinuria, unspecified: Secondary | ICD-10-CM | POA: Diagnosis not present

## 2020-11-30 DIAGNOSIS — E1165 Type 2 diabetes mellitus with hyperglycemia: Secondary | ICD-10-CM | POA: Diagnosis not present

## 2020-11-30 NOTE — Patient Instructions (Addendum)
Call back to give sugar results on phone   Reduce lunch dose to 5-6 units not 8 when working in afternoon  Check blood sugars on waking up days a week  Also check blood sugars about 2 hours after meals and do this after different meals by rotation  Recommended blood sugar levels on waking up are 90-130 and about 2 hours after meal is 130-180  Please bring your blood sugar monitor to each visit, thank you

## 2020-11-30 NOTE — Telephone Encounter (Signed)
Noted and entered in patient record

## 2020-11-30 NOTE — Telephone Encounter (Signed)
Blood sugar readings below.

## 2020-11-30 NOTE — Progress Notes (Signed)
Patient ID: Joel Webb, male   DOB: January 20, 1946, 75 y.o.   MRN: [id number]           Reason for Appointment: Follow-up for Type 2 Diabetes and related problems    Referring physician: Dr. Moreen Fowler  History of Present Illness:          Date of diagnosis of type 2 diabetes mellitus: 2007       Background history:   He is unclear when he was diagnosed to have diabetes but was not very symptomatic He had been on metformin alone for several years with reportedly good control Also had previously been very consistent with exercise and diet His blood sugars probably started going up 3-4 years ago and he was given Lantus insulin in addition He was seen in consultation in 4/17 with an A1c of 8.4 and he was started Trulicity in addition to his Lantus and metformin This was stopped in 7/17 because of high out-of-pocket expense  The V-go pump was stopped because he found it too uncomfortable and not sticking well  Recent history:   INSULIN regimen is: Tresiba 40 units in p.m., Novolin R 8 at breakfast and lunchtime; 12 units at supper  Non-insulin hypoglycemic drugs the patient is taking are: metformin 1 g twice a day, Trulicity 1.5 mg weekly   Current blood sugar patterns and problems identified:   His A1c is much better at 6.7 compared to 7.7   He again did bring his glucose monitor  He does feel a little 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 but not in the mornings  However does not check his sugar when he feels hypoglycemic  He thinks he has not had any overnight hypoglycemia  Tyler Aas has been reduced to 40 units instead of 42  He is still trying to be regular with taking insulin with all his meals  His weight is about the same recently  Review of blood sugars from his meter after the visit indicates fairly stable blood sugars at different times of the day with only one high reading of 225 probably right after lunch  Try to check sugars at  different times  He thinks he is regular with his Trulicity and no nausea with this  He is mostly active with doing work at the golf course but also periodically going to the gym in the mornings    Glucose monitoring:  done less than 1 times a day         Glucometer:  Accu-Chek Blood Glucose readings by patient review of monitor from home:  11/01/20 902AM 124   11/01/20 707PM 123   11/02/20 1000AM 117   11/03/20 1226PM 225   11/03/20 136PM 145   11/04/20 102PM 127   11/19/20 837AM 127   11/20/20 935PM 154   11/27/20 1000PM 143   11/29/20 900AM 118    Previously:  PRE-MEAL Fasting Lunch Dinner Bedtime Overall  Glucose range:  108-131  113, 130     Mean/median:  118     144   POST-MEAL PC Breakfast PC Lunch PC Dinner  Glucose range:   177-230   Mean/median:        Self-care:  Usually tries to limit High-fat foods and portions                 Dietician visit, most recent:6/17                Weight history:  Wt  Readings from Last 3 Encounters:  11/30/20 187 lb 9.6 oz (85.1 kg)  10/16/20 187 lb (84.8 kg)  08/11/20 189 lb (85.7 kg)    Glycemic control:   Lab Results  Component Value Date   HGBA1C 6.7 (H) 11/07/2020   HGBA1C 7.7 (H) 05/23/2020   HGBA1C 6.6 (A) 04/07/2020   Lab Results  Component Value Date   MICROALBUR 30.0 (H) 11/28/2020   LDLCALC 97 02/02/2016   CREATININE 1.40 11/28/2020   Lab Results  Component Value Date   MICRALBCREAT 33.3 (H) 11/28/2020    Lab Results  Component Value Date   FRUCTOSAMINE 248 08/08/2020   FRUCTOSAMINE 266 06/02/2017   FRUCTOSAMINE 233 02/04/2017     Other problems addressed today: See review of systems   Lab on 11/28/2020  Component Date Value Ref Range Status  . Microalb, Ur 11/28/2020 30.0* 0.0 - 1.9 mg/dL Final  . Creatinine,U 11/28/2020 90.1  mg/dL Final  . Microalb Creat Ratio 11/28/2020 33.3* 0.0 - 30.0 mg/g Final  . Sodium 11/28/2020 137  135 - 145 mEq/L Final  . Potassium 11/28/2020 4.8   3.5 - 5.1 mEq/L Final  . Chloride 11/28/2020 102  96 - 112 mEq/L Final  . CO2 11/28/2020 27  19 - 32 mEq/L Final  . Glucose, Bld 11/28/2020 127* 70 - 99 mg/dL Final  . BUN 11/28/2020 31* 6 - 23 mg/dL Final  . Creatinine, Ser 11/28/2020 1.40  0.40 - 1.50 mg/dL Final  . GFR 11/28/2020 49.65* >60.00 mL/min Final   Calculated using the CKD-EPI Creatinine Equation (2021)  . Calcium 11/28/2020 10.0  8.4 - 10.5 mg/dL Final      Allergies as of 11/30/2020   No Known Allergies     Medication List       Accurate as of November 30, 2020  3:28 PM. If you have any questions, ask your nurse or doctor.        Accu-Chek SmartView test strip Generic drug: glucose blood Use to check blood sugar 1 time per day.   amLODipine 5 MG tablet Commonly known as: NORVASC   atorvastatin 80 MG tablet Commonly known as: LIPITOR Take 80 mg by mouth daily.   BD Insulin Syringe U/F 31G X 5/16" 1 ML Misc Generic drug: Insulin Syringe-Needle U-100 USE TO GIVE 2 INSULIN INJECTIONS DAILY   DULoxetine 60 MG capsule Commonly known as: CYMBALTA 60 mg daily.   finasteride 5 MG tablet Commonly known as: PROSCAR   Fish Oil 1000 MG Caps Take by mouth. 2x/day   insulin regular 100 units/mL injection Commonly known as: NovoLIN R ReliOn Inject 0.08 mLs (8 Units total) into the skin 2 (two) times daily before a meal. Inject 10 units under the skin once daily at supper What changed: additional instructions   losartan-hydrochlorothiazide 100-12.5 MG tablet Commonly known as: HYZAAR Take 1 tablet by mouth daily.   metFORMIN 1000 MG tablet Commonly known as: GLUCOPHAGE TAKE ONE TABLET BY MOUTH TWICE A DAY WITH MEALS   multivitamin with minerals Tabs tablet Take 1 tablet by mouth daily.   omeprazole 20 MG capsule Commonly known as: PRILOSEC Take 20 mg by mouth every other day. PRN   oxybutynin 5 MG 24 hr tablet Commonly known as: DITROPAN-XL 06/12/20 not started   Pen Needles 31G X 6 MM Misc Use to  inject insulin daily.   tamsulosin 0.4 MG Caps capsule Commonly known as: FLOMAX Take 0.4 mg by mouth. Reported on 05/29/2016   Tyler Aas FlexTouch 200 UNIT/ML FlexTouch Pen  Generic drug: insulin degludec INJECT 40 UNITS UNDER THE SKIN DAILY   Trulicity 1.5 0000000 Sopn Generic drug: Dulaglutide Inject once weekly   UNABLE TO FIND Med Name: Prevagen       Allergies: No Known Allergies  Past Medical History:  Diagnosis Date  . CKD (chronic kidney disease), stage III ([place])   . Depression   . Diabetes mellitus without complication ([place])   . ED (erectile dysfunction)   . GERD (gastroesophageal reflux disease)   . Hyperlipidemia   . Hypertension   . Plantar fasciitis   . Prostatitis     Past Surgical History:  Procedure Laterality Date  . CIRCUMCISION    . COLONOSCOPY    . MOUTH SURGERY  01/2020    Family History  Problem Relation Age of Onset  . Hepatitis C Mother   . Mesothelioma Father   . Diabetes Father   . Heart disease Father     Social History:  reports that he quit smoking about 11 years ago. He has never used smokeless tobacco. He reports current alcohol use. He reports that he does not use drugs.    Review of Systems     RENAL dysfunction: Creatinine is somewhat variable  Lab Results  Component Value Date   CREATININE 1.40 11/28/2020   CREATININE 1.37 11/07/2020   CREATININE 1.22 05/23/2020    Lipid history: Has been on Lipitor for cholesterol control from his PCP  Labs show LDL 89 and 12/21 with triglycerides 156    Lab Results  Component Value Date   CHOL 129 12/24/2016   HDL 23.70 (L) 12/24/2016   LDLCALC 97 02/02/2016   LDLDIRECT 84.0 04/07/2020   TRIG 210.0 (H) 12/24/2016   CHOLHDL 5 12/24/2016           Hypertension:Long-standing, treated with Benicar HCT 40/12.5, amlodipine 5 mg daily, followed by PCP   Does monitor periodically at home but does not member his readings  Previously on losartan with high normal blood  pressure readings and his PCP was recommended switching losartan to Benicar With this his urine microalbumin is only 33 compared to about 70   BP Readings from Last 3 Encounters:  11/30/20 132/78  10/16/20 (!) 145/86  08/11/20 126/84    Review of Systems   Most recent eye exam was in 09/2020, no retinopathy  Most recent foot exam: 05/2020    Physical Examination:  BP 132/78   Pulse 98   Wt 187 lb 9.6 oz (85.1 kg)   SpO2 98%   BMI 27.70 kg/m     ASSESSMENT:  Diabetes type 2, on insulin  See history of present illness for  discussion of current diabetes management, blood sugar patterns and problems identified  His A1c is down 1% from his previous level and now 6.7  A1c has improved with the higher dose of Trulicity 1.5 mg He is also quite consistent with taking his insulin, exercising regularly and generally watching his diet Review of his blood sugars from his meter show excellent levels without hypoglycemia Only once has had a reading over 200 probably right after lunch when he had more carbohydrate  Hypertension with microalbuminuria: Improved control with Benicar compared to losartan Also his microalbumin appears to be much better already with starting Benicar about a month ago   PLAN:     Reminded him to take his Novolin regular insulin 30-minute before eating if possible  Continue checking blood sugars at various times and remember to bring monitor below download on  each visit  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 he should reduce his regular insulin at lunchtime to 5-6 units instead of 8  Advised him to review his instructions when he goes home  No change in [place], Trulicity or metformin  May benefit from using freestyle [place] and will discuss on next visit, currently concerned about cost of various medications and supplies  Follow-up in 4 months  Patient Instructions  Call back to give sugar results on phone   Reduce lunch dose to  5-6 units not 8 when working in afternoon      Elayne Snare 11/30/2020, 3:28 PM   Note: This office note was prepared with Dragon voice recognition system technology. Any transcriptional errors that result from this process are unintentional.

## 2020-11-30 NOTE — Telephone Encounter (Signed)
  Date Time Reading Notes       11/01/20 902AM 124   11/01/20 707PM 123   11/02/20 1000AM 117   11/03/20 1226PM 225   11/03/20 136PM 145   11/04/20 102PM 127   11/19/20 837AM 127   11/20/20 935PM 154   11/27/20 1000PM 143   11/29/20 900AM 118

## 2020-12-13 DIAGNOSIS — L814 Other melanin hyperpigmentation: Secondary | ICD-10-CM | POA: Diagnosis not present

## 2020-12-13 DIAGNOSIS — D225 Melanocytic nevi of trunk: Secondary | ICD-10-CM | POA: Diagnosis not present

## 2020-12-13 DIAGNOSIS — D2262 Melanocytic nevi of left upper limb, including shoulder: Secondary | ICD-10-CM | POA: Diagnosis not present

## 2020-12-13 DIAGNOSIS — L570 Actinic keratosis: Secondary | ICD-10-CM | POA: Diagnosis not present

## 2020-12-13 DIAGNOSIS — Z85828 Personal history of other malignant neoplasm of skin: Secondary | ICD-10-CM | POA: Diagnosis not present

## 2020-12-13 DIAGNOSIS — D692 Other nonthrombocytopenic purpura: Secondary | ICD-10-CM | POA: Diagnosis not present

## 2020-12-13 DIAGNOSIS — L821 Other seborrheic keratosis: Secondary | ICD-10-CM | POA: Diagnosis not present

## 2020-12-26 ENCOUNTER — Other Ambulatory Visit: Payer: Self-pay | Admitting: *Deleted

## 2020-12-26 MED ORDER — PEN NEEDLES 31G X 6 MM MISC: 2 refills | Status: DC

## 2021-01-01 ENCOUNTER — Other Ambulatory Visit: Payer: Self-pay | Admitting: *Deleted

## 2021-01-01 MED ORDER — "BD INSULIN SYRINGE U/F 31G X 5/16"" 1 ML MISC": 5 refills | Status: DC

## 2021-01-02 DIAGNOSIS — E782 Mixed hyperlipidemia: Secondary | ICD-10-CM | POA: Diagnosis not present

## 2021-01-02 DIAGNOSIS — E119 Type 2 diabetes mellitus without complications: Secondary | ICD-10-CM | POA: Diagnosis not present

## 2021-01-02 DIAGNOSIS — F329 Major depressive disorder, single episode, unspecified: Secondary | ICD-10-CM | POA: Diagnosis not present

## 2021-01-02 DIAGNOSIS — I10 Essential (primary) hypertension: Secondary | ICD-10-CM | POA: Diagnosis not present

## 2021-01-02 DIAGNOSIS — K219 Gastro-esophageal reflux disease without esophagitis: Secondary | ICD-10-CM | POA: Diagnosis not present

## 2021-01-02 DIAGNOSIS — N400 Enlarged prostate without lower urinary tract symptoms: Secondary | ICD-10-CM | POA: Diagnosis not present

## 2021-01-02 DIAGNOSIS — E1122 Type 2 diabetes mellitus with diabetic chronic kidney disease: Secondary | ICD-10-CM | POA: Diagnosis not present

## 2021-01-02 DIAGNOSIS — N402 Nodular prostate without lower urinary tract symptoms: Secondary | ICD-10-CM | POA: Diagnosis not present

## 2021-01-02 DIAGNOSIS — N183 Chronic kidney disease, stage 3 unspecified: Secondary | ICD-10-CM | POA: Diagnosis not present

## 2021-01-11 DIAGNOSIS — K219 Gastro-esophageal reflux disease without esophagitis: Secondary | ICD-10-CM | POA: Insufficient documentation

## 2021-01-11 DIAGNOSIS — E7800 Pure hypercholesterolemia, unspecified: Secondary | ICD-10-CM | POA: Insufficient documentation

## 2021-01-11 DIAGNOSIS — N183 Chronic kidney disease, stage 3 unspecified: Secondary | ICD-10-CM | POA: Insufficient documentation

## 2021-01-11 HISTORY — DX: Chronic kidney disease, stage 3 unspecified: N18.30

## 2021-01-11 HISTORY — DX: Pure hypercholesterolemia, unspecified: E78.00

## 2021-01-11 HISTORY — DX: Gastro-esophageal reflux disease without esophagitis: K21.9

## 2021-01-17 ENCOUNTER — Ambulatory Visit: Payer: Medicare HMO | Admitting: Neurology

## 2021-01-19 DIAGNOSIS — H02834 Dermatochalasis of left upper eyelid: Secondary | ICD-10-CM | POA: Diagnosis not present

## 2021-01-19 DIAGNOSIS — H57813 Brow ptosis, bilateral: Secondary | ICD-10-CM | POA: Diagnosis not present

## 2021-01-19 DIAGNOSIS — H02413 Mechanical ptosis of bilateral eyelids: Secondary | ICD-10-CM | POA: Diagnosis not present

## 2021-01-19 DIAGNOSIS — D485 Neoplasm of uncertain behavior of skin: Secondary | ICD-10-CM | POA: Diagnosis not present

## 2021-01-19 DIAGNOSIS — H02831 Dermatochalasis of right upper eyelid: Secondary | ICD-10-CM | POA: Diagnosis not present

## 2021-01-19 DIAGNOSIS — D23122 Other benign neoplasm of skin of left lower eyelid, including canthus: Secondary | ICD-10-CM | POA: Diagnosis not present

## 2021-01-26 NOTE — Telephone Encounter (Signed)
Patient called to follow up on request for [place].  He is out and requesting a refill be sent to his pharmacy.  [facility] back number for patient is  [phone]

## 2021-01-30 ENCOUNTER — Other Ambulatory Visit: Payer: Self-pay | Admitting: *Deleted

## 2021-01-30 DIAGNOSIS — E1165 Type 2 diabetes mellitus with hyperglycemia: Secondary | ICD-10-CM

## 2021-01-30 MED ORDER — TRESIBA FLEXTOUCH 200 UNIT/ML ~~LOC~~ SOPN: PEN_INJECTOR | SUBCUTANEOUS | 1 refills | Status: DC

## 2021-02-26 ENCOUNTER — Other Ambulatory Visit: Payer: Self-pay

## 2021-02-26 ENCOUNTER — Ambulatory Visit (INDEPENDENT_AMBULATORY_CARE_PROVIDER_SITE_OTHER): Payer: Medicare HMO | Admitting: Counselor

## 2021-02-26 ENCOUNTER — Encounter: Payer: Self-pay | Admitting: Counselor

## 2021-02-26 ENCOUNTER — Ambulatory Visit: Payer: Medicare HMO | Admitting: Psychology

## 2021-02-26 DIAGNOSIS — R4189 Other symptoms and signs involving cognitive functions and awareness: Secondary | ICD-10-CM | POA: Diagnosis not present

## 2021-02-26 DIAGNOSIS — R454 Irritability and anger: Secondary | ICD-10-CM

## 2021-02-26 DIAGNOSIS — R4689 Other symptoms and signs involving appearance and behavior: Secondary | ICD-10-CM | POA: Diagnosis not present

## 2021-02-26 DIAGNOSIS — R413 Other amnesia: Secondary | ICD-10-CM | POA: Diagnosis not present

## 2021-02-26 DIAGNOSIS — F09 Unspecified mental disorder due to known physiological condition: Secondary | ICD-10-CM

## 2021-02-26 NOTE — Progress Notes (Signed)
   Psychometrist Note   Cognitive testing was administered to [name] by Milana Kidney, B.S. (Technician) under the supervision of Alphonzo Severance, Psy.D., ABN. [name] was able to tolerate all test procedures. Dr. Nicole Kindred met with the patient as needed to manage any emotional reactions to the testing procedures. Rest breaks were offered.    The battery of tests administered was selected by Dr. Nicole Kindred with consideration to the patient's current level of functioning, the nature of his symptoms, emotional and behavioral responses during the interview, level of literacy, observed level of motivation/effort, and the nature of the referral question. This battery was communicated to the psychometrist. Communication between Dr. Nicole Kindred and the psychometrist was ongoing throughout the evaluation and Dr. Nicole Kindred was immediately accessible at all times. Dr. Nicole Kindred provided supervision to the technician on the date of this service, to the extent necessary to assure the quality of all services provided.    [name] will return in approximately one week for an interactive feedback session with Dr. Nicole Kindred, at which time test performance, clinical impressions, and treatment recommendations will be reviewed in detail. The patient understands he can contact our office should he require our assistance before this time.   A total of 80 minutes of billable time were spent with [name] by the technician, including test administration and scoring time. Billing for these services is reflected in Dr. Les Pou note.   This note reflects time spent with the psychometrician and does not include test scores, clinical history, or any interpretations made by Dr. Nicole Kindred. The full report will follow in a separate note.

## 2021-02-26 NOTE — Progress Notes (Signed)
[facility]  Patient Name: Joel Webb MRN: [id number] Date of Birth: 06-06-1946 Age: 75 y.o. Education: 16 years  Referral Circumstances and Background Information  Joel Webb is a 75 y.o., right-hand dominant, married man with a history of hyperlipidemia, diabetes, high blood pressure, gait and balance difficulties. He is known to me having been previously evaluated due to concerns about FTD (04/05/221), although he generally did fairly well on neuropsychological testing and I was more concerned about depression. He has a history of being somewhat irritable, isolated, and impatient and it was felt that those tendencies have become exacerbated in his old age. He returns for reevaluation.   On interview, the patient has no particular questions or concerns. His wife, however, reported that "nothing has gotten better" and I have the sense there is still significant tension in their relationship. She is also concerned because the patient has started falling and stumbling more frequently. He has had 4 falls since Thanksgiving. He is also bumping into things on the left hand side. Dr. Leta Baptist thought that this might be related to neuropathy although I see that he had a normal sensory exam at his last appointment. He has gotten physical therapy but doesn't think it helps. With regards to the patient's behavioral and cognitive changes, his wife thinks they are worse. She says that he does better if things are routine but when anything changes, "all hell breaks loose." He gets "very frustrated, very irritated." The patient himself is aware of that. "I'm used to doing things in a certain manner, I get upset when they go wrong." They provided several examples that sounded as though they represented understandable irritation. His wife said that his reactions, however, are out of sync with the situation. She provided an example where her 51 year old mother was  with them because she didn't have heat, she was asking about the TV, and he told her she needed to go home. He is aware that this was the wrong thing to say, felt bad about it, and he did apologize. There is no physical aggressiveness or violence, there is yelling, however. His hearing issues also don't help, his wife gets upset when he doesn't hear her but he thinks she isn't loud enough and speaks at him from the other room. His wife also thinks that he doesn't notice things as well as in the past. He seems aloof, he will ignore her friends when they come over, although he has done that for some time. There are no clear FTD type behaviors such as compulsiveness or hoarding, flagrant loss of social graces (though he is rude with her friends), there is no profound apathy. There is no clear hyperorality but he does eat too many sweets and carbohydrates, that has always been his tendency. He is not going up to people inappropriately in public.   With respect to mood, the patient reported that he is doing well and feels fine. He denied feeling irritable and seemed to have excuses/reasons for the behaviors his wife mentioned. He is on Cymbalta and they think when he started that, it did help his irritability. He is sleeping well, usually 8-8.5 hours. His wife says that he sleeps a lot during the day, she is not sure exactly how much, maybe a couple of hours. His appetite is adequate and his weight is stable. Nevertheless, his wife says he eats a lot of sweets and carbs and does not listen to his endocrinologist.  With respect to functioning, the patient's wife took them over recently, because he had made some errors. He paid things to the wrong people and they had to get refunds. He was, however, able to do their taxes this year. They have been accepted although of course that is no guarantee they are accurate. His wife doesn't know about taxes so she couldn't check them. He is still driving and his wife assumes it  is going ok, she drives with him very infrequently (it makes her nervous). He stated that it has always made her nervous but she says it is worse. 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 This is 15-20 hours or so per week. He is able to cook to some extent and he has stopped leaving the burner on, which was a big issue for his wife in the past. He says he pays more attention to what he is doing. His wife thinks he has difficulties with judgment and problem solving, although she had a hard time providing examples. He is managing his own medications. He started an antidepressant, which has helped his irritability as per his wife, although he still has issues.    Past Medical History and Review of Relevant Studies   Patient Active Problem List   Diagnosis Date Noted  . Microalbuminuria due to type 2 diabetes mellitus ([place]) 05/25/2020  . Essential hypertension 06/05/2017  . Uncontrolled type 2 diabetes mellitus with hyperglycemia, with long-term current use of insulin ([place]) 02/28/2016  . Mixed hyperlipidemia 02/28/2016    Review of Neuroimaging and Relevant Medical History: From my previous review of Joel Webb:  Joel Webb has an MRI of the brain from 01/12/2020 that reveals mild to moderate generalized volume loss, mainly in the parietal and frontal lobes with relative temporal sparing. There is a mild burden of leukoaraiosis in the periventricular and subcortical cerebral white matter, not enough to cause a dementia level of function in my opinion and borderline as a significant contributor.   The patient denied any significant head injury of strokes, seizures, or significant head injuries.   Current Outpatient Medications  Medication Sig Dispense Refill  . amLODipine (NORVASC) 5 MG tablet     . atorvastatin (LIPITOR) 80 MG tablet Take 80 mg by mouth daily.    . BD INSULIN SYRINGE U/F 31G X 5/16" 1 ML MISC USE  TO GIVE 2 INSULIN INJECTIONS DAILY 200 each 5  . Dulaglutide (TRULICITY) 1.5 HX/5.0VW SOPN Inject once weekly 6 mL 1  . DULoxetine (CYMBALTA) 60 MG capsule 60 mg daily.    . finasteride (PROSCAR) 5 MG tablet     . glucose blood (ACCU-CHEK SMARTVIEW) test strip Use to check blood sugar 1 time per day. 50 each 3  . insulin degludec (TRESIBA FLEXTOUCH) 200 UNIT/ML FlexTouch Pen INJECT 40 UNITS UNDER THE SKIN DAILY 15 mL 1  . Insulin Pen Needle (PEN NEEDLES) 31G X 6 MM MISC Use to inject insulin daily. 100 each 2  . insulin regular (NOVOLIN R RELION) 100 units/mL injection Inject 0.08 mLs (8 Units total) into the skin 2 (two) times daily before a meal. Inject 10 units under the skin once daily at supper (Patient taking differently: Inject 8 Units into the skin 2 (two) times daily before a meal. Patient injecting 6 units in the morning 8 units before lunch and 10 units before supper) 10 mL 3  . metFORMIN (  GLUCOPHAGE) 1000 MG tablet TAKE ONE TABLET BY MOUTH TWICE A DAY WITH MEALS 180 tablet 0  . Multiple Vitamin (MULTIVITAMIN WITH MINERALS) TABS tablet Take 1 tablet by mouth daily.    Marland Kitchen olmesartan-hydrochlorothiazide (BENICAR HCT) 40-12.5 MG tablet     . Omega-3 Fatty Acids (FISH OIL) 1000 MG CAPS Take by mouth. 2x/day    . omeprazole (PRILOSEC) 20 MG capsule Take 20 mg by mouth every other day. PRN    . oxybutynin (DITROPAN-XL) 5 MG 24 hr tablet 06/12/20 not started    . tamsulosin (FLOMAX) 0.4 MG CAPS capsule Take 0.4 mg by mouth. Reported on 05/29/2016    . UNABLE TO FIND Med Name: Prevagen     No current facility-administered medications for this visit.    Family History  Problem Relation Age of Onset  . Hepatitis C Mother   . Mesothelioma Father   . Diabetes Father   . Heart disease Father    There is no  family history of dementia. There is no  family history of psychiatric illness, although he wonders if his mother had OCD.  Psychosocial History  As per my previous note:  Developmental,  Educational and Employment History: The patient stated that he was a good student who was never held back and didn't have any learning difficulties. He earned adequate grades and then went on to [employer] at [employer] and got a degree in business. 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 That business dried up and he retired a while after that and eventually retired around 10 years ago.   Psychiatric History: The patient denied any history of psychiatric illness prior to the events that brought him here. He recently started taking Cymbalta, he hasn't really noticed any changes. He is involved in counseling and has been doing that for approximately a year, he sees his practitioner once every couple of weeks. They are mainly working on communication.   Substance Use History: The patient doesn't currently consume alcohol, he stopped drinking when he quit smoking about 13 years ago. He smoked since he was in college.   Relationship History and Living Cimcumstances: The patient and his wife have been married for approximately 32 years. They have one daughter (it is the patient's step daughter but he raised her from a young child).   Mental Status and Behavioral Observations  Sensorium/Arousal: The patient's level of arousal was awake and alert. Hearing and vision were adequate with correction for testing purposes. Orientation: The patient was oriented to person, place, time, and situation. Aware of the current and past [place] presidents.  Appearance: The patient was dressed in appropriate, casual clothing with reasonable grooming and hygiene.  Behavior: Pleasant, appropriate, did not present as disinhibited or overtly "frontal" in his behaviors Speech/language: Speech was normal in rate, rhythm, and volume and clearly articulated Gait/Posture: Not formally examined, normal with Dr. Leta Baptist Movement: Normal exam with Dr. Leta Baptist Social  Comportment: Appropriate within social norms Mood: "Good" Affect: Neutral Thought process/content: Thought process was logical and goal oriented, no loosening of associations. Content appropriate to the evaluative context.  Safety: No safety concerns identified in this euthymic patient.  [facility] Cognitive Assessment  02/26/2021 02/21/2020  Visuospatial/ Executive (0/5) 3 3  Naming (0/3) 3 3  Attention: Read list of digits (0/2) 2 1  Attention: Read list of letters (0/1) 1 1  Attention: Serial 7 subtraction starting at 100 (0/3) 3 3  Language: Repeat phrase (0/2)  0 2  Language : Fluency (0/1) 0 1  Abstraction (0/2) 2 1  Delayed Recall (0/5) 2 4  Orientation (0/6) 6 6  Total 22 25  Adjusted Score (based on education) 22 25   Test Procedures  Wide Range Achievement Test - 4             Word Reading Neuropsychological Assessment Battery  List Learning  Story Learning  Daily Living Memory  Naming  Digit Span Repeatable Battery for the Assessment of Neuropsychological Status (Form A)  Figure Copy  Judgment of Line Orientation  Coding  Figure Recall The Dot Counting Test A Random Letter Test Controlled Oral Word Association (F-A-S) Semantic Fluency (Animals) Trail Making Test A & B Complex Ideational Material Modified [place] Card Sorting Test Geriatric Depression Scale - Short Form Quick Dementia Rating System (completed by wife [name])  Plan  Joel Webb was seen for a psychiatric diagnostic evaluation and neuropsychological testing. He and his wife report similar difficulties, in terms of ongoing irritability, resistance to changes in his routine. Nevertheless, he has no clear florid behavioral changes and it is challenging to tell if this is simply the result of premorbid personality factors in the context of age vs. Bona-fide behavioral changes concerning for an underlying condition. He is falling more but he does have diabetes and it was felt his  stumbling/falls could be on the basis of neuropathy. He has no new Webb or other pertinent studies. He is scoring a bit lower on the MoCA than in the past, with declines on generation of words in response to the letter F and delayed recall among other areas. Full and complete note with impressions, recommendations, and interpretation of test data to follow.   Viviano Simas Nicole Kindred, PsyD, [place] Clinical Neuropsychologist  Informed Consent  Risks and benefits of the evaluation were discussed with the patient prior to all testing procedures. I conducted a clinical interview and neuropsychological testing (at least two tests) with [name] and Milana Kidney, B.S. (Technician) administered additional test procedures. The patient was able to tolerate the testing procedures and the patient (and/or family if applicable) is likely to benefit from further follow up to receive the diagnosis and treatment recommendations, which will be rendered at the next encounter.

## 2021-02-27 NOTE — Progress Notes (Signed)
[facility]  Patient Name: Joel Webb MRN: [id number] Date of Birth: [birth year] Age: 75 y.o. Education: 16 years  Measurement properties of test scores: IQ, Index, and Standard Scores (SS): Mean = 100; Standard Deviation = 15 Scaled Scores (Ss): Mean = 10; Standard Deviation = 3 Z scores (Z): Mean = 0; Standard Deviation = 1 T scores (T); Mean = 50; Standard Deviation = 10  TEST SCORES:    Note: This summary of test scores accompanies the interpretive report and should not be interpreted by unqualified individuals or in isolation without reference to the report. Test scores are relative to age, gender, and educational history as available and appropriate.   Performance Validity        "A" Random Letter Test Raw  Descriptor      Errors 4 Below Expectation  The Dot Counting Test: 17 Within Expectation  NAB Effort Index 0 Within Expectation      Mental Status Screening     Total Score Descriptor  MoCA 22 MCI      Expected Functioning        Wide Range Achievement Test: Standard/Scaled Score Percentile      Word Reading 97 42      Attention/Processing Speed        Neuropsychological Assessment Battery (Attention Module, Form 1): T-score Percentile      Digits Forward 49 46      Digits Backwards 40 16      Repeatable Battery for the Assessment of Neuropsychological Status (Form A): Scaled Score Percentile      Coding 6 9      Language        Neuropsychological Assessment Battery (Language Module, Form 1): T-score Percentile      Naming   (31) 57 75      Verbal Fluency: T-score Percentile      Controlled Oral Word Association (F-A-S) 34 5      Semantic Fluency (Animals) 30 2      Memory:        Neuropsychological Assessment Battery (Memory Module, Form 1): T-score Percentile      List Learning           List A Immediate Recall   (4, 5, 6) 35 7         List B Immediate Recall   (2) 34 5         List A Short Delayed  Recall   (4) 34 5         List A Long Delayed Recall   (6) 45 31         List A Percent Retention   (150 %) --- 96         List A Long Delayed Yes/No Recognition Hits   (8) --- <1         List A Long Delayed Yes/No Recognition False Alarms   (3) --- 62         List A Recognition Discriminability Index --- 31      Story Learning           Immediate Recall   (15, 30) 35 7         Delayed Recall   (24) 37 9         Percent Retention   (80 %) --- 31      Daily Living Memory            Immediate Recall   (  25, 24) 69 97          Delayed Recall   (8, 8) 62 88          Percent Retention (100 %) --- 84          Recognition Hits    (10) --- 86      Repeatable Battery for the Assessment of Neuropsychological Status (Form A): Scaled Score Percentile         Figure Recall   (17) 13 84      Visuospatial/Constructional Functioning        Repeatable Battery for the Assessment of Neuropsychological Status (Form A): Standard/Scaled Score Percentile     Visuospatial/Constructional Index 102 55         Figure Copy   (20) 14 91         Judgment of Line Orientation   (13) --- 10-16      Executive Functioning        Modified [place] Card Sorting Test (MWCST): Standard/T-Score Percentile      Number of Categories Correct 42 21      Number of Perseverative Errors 66 95      Number of Total Errors 60 84      Percent Perseverative Errors 65 93  Executive Function Composite 107 68      Trail Making Test: T-Score Percentile      Part A 30 2      Part B 42 21      [place] Diagnostic Aphasia Exam: Raw Score Scaled Score      Complex Ideational Material 12 12      Clock Drawing Raw Score Descriptor      Command 10 WNL      Rating Scales        Quick Dementia Rating System Raw Score Descriptor      Sum of Boxes 6 Mild Dementia      Total Score 12.5 Moderate Dementia  Geriatric Depression Scale - Short Form 2 Negative    Maureena Dabbs V. Nicole Kindred PsyD, [place] Clinical Neuropsychologist

## 2021-03-01 NOTE — Progress Notes (Signed)
[facility]  Patient Name: Joel Webb MRN: [id number] Date of Birth: 1946-09-29 Age: 75 y.o. Education: 75 years  Clinical Impressions  Joel Webb is a 75 y.o., right-hand dominant, married man with a history of cognitive and behavioral changes mainly reported by his wife, leading to concern about FTD. He is following with Dr. Leta Baptist who referred him for evaluation. The patient is known to me having been previously evaluated 02/24/2020, with some minor difficulties on measures of attention and working memory, executive function, and on word list memory encoding although there were no concerns about frank impairment. I was more concerned about marital distress than I was about dementia but could not entirely rule the possibility of neurodegeneration out. He returns for reevaluation and things are the same to somewhat worse as per his wife. The same difficulties continue in terms of the patient still being very irritable when his routine changes and being unkind/aloof to friends and family. The patient himself has no complaints. He continues to function adequately in most areas but his wife did take over finances because he was missing some bills.   On neuropsychological assessment, there is suggestion of changes with respect to memory, which is now falling just within the abnormal range for an individual of average overall ability. This finding is rendered more significant by the fact that practice effects are expected but instead, he is doing worse, largely related to lower scores on story learning. He also had marginally lower scores on measures of verbal fluency and processing speed yet these may be within the realm of measurement error. He did better on some tasks of executive function, attention/working memory, and on measures of visuospatial/constructional abilities, likely representing practice effects. His wife now rates him as functioning at  a mild to moderate dementia level, reporting nearly double the symptom elevations she did at the previous assessment, which may be due to their level of distress/conflict. He screened negative for the presence of depression.   Joel Webb memory test data is now abnormal, although he is still not manifesting a clear storage problem and changes as compared to his previous test data are fairly mild. Nevertheless, with abnormal test data and report of changes, a diagnosis of mild cognitive impairment seems warranted. Given the presence of some mild decline and the level of distress the question of dementia seems to cause the couple, a metabolic PET scan could be considered. Neuropsychological testing demonstrates low sensitivity for behavioral variant FTD and his clinical signs/symptoms are not sufficient for diagnosis at the present time. Watching and waiting would also be reasonable.    Diagnostic Impressions: Mild cognitive impairment Adjustment disorder, unspecified  Recommendations to be discussed with patient  Your performance and presentation was fairly similar to the last assessment with some areas of minor change. You are now demonstrating mildly abnormal memory performance and possible declines in other areas. On the other hand, you did better in some domains, such as on select measures of executive function and also on measures of visuospatial abilities. Given these findings and the fact that there have been some minor changes in functional status (e.g., difficulty doing finances), I do think a diagnosis of mild cognitive impairment is appropriate.   The major difference between mild cognitive impairment (MCI) and dementia is in severity and potential prognosis. Once someone reaches a level of severity adequate to be diagnosed with a dementia, there is usually progression over time, though this may be years. On the other hand,  mild cognitive impairment, while a significant risk for dementia in  future, does not always progress to dementia, and in some instances stays the same or can even revert to normal. It is important to realize that if MCI is due to underlying Alzheimer's disease, it will most likely progress to dementia eventually. The rate of conversion to Alzheimer's dementia from amnestic MCI is about 15% per year versus the general population risk of conversion of 2% per year.   I remain unconvinced that your mild cognitive issues are due to a prodromal degenerative condition, although it is of course possible. As you are aware, the primary concern was FTD, but you have only very vague signs and symptoms associated with that condition. Individuals with bvFTD by definition show profound behavior changes including inappropriate, apathetic, and disinhibited behaviors. These behaviors must represent a definite and significant change for the individual and be impairing to their day-to-day functioning. In your case, you continue to have a preference for a routine oriented life, eat too many sweets, and to have a low activity level although those are also common in the normal healthy population and are not necessarily a sign that you have a brain disease.   If diagnostic clarity is needed, I would recommend that you pursue further evaluation. The study of choice would likely be a metabolic PET scan, which is a special type of image that allows [place] to see functional changes in your brains energy uses. Sometimes, this study can change prior to actual structural changes that are visible on MRI. Neuropsychological testing has low sensitivity for behavioral variant FTD and your clinical signs and symptoms are not sufficient for diagnosis at this point in time.   The mainstay for treatment of mild cognitive impairment is lifestyle changes. Some studies have shown that adhering to the MIND diet, in particular, can result in differences in cognitive decline equivalent to being 7.5 years younger in age.    There is now good quality evidence from at least one large scale study that a modified mediterranean diet may help slow cognitive decline. This is known as the "MIND" diet. The Mind diet is not so much a specific diet as it is a set of recommendations for things that you should and should not eat.   Foods that are ENCOURAGED on the MIND Diet:  Green, leafy vegetables: Aim for six or more servings per week. This includes kale, spinach, cooked greens and salads.  All other vegetables: Try to eat another vegetable in addition to the green leafy vegetables at least once a day. It is best to choose non-starchy vegetables because they have a lot of nutrients with a low number of calories.  Berries: Eat berries at least twice a week. There is a plethora of research on strawberries, and other berries such as blueberries, raspberries and blackberries have also been found to have antioxidant and brain health benefits.  Nuts: Try to get five servings of nuts or more each week. The creators of the [place] don't specify what kind of nuts to consume, but it is probably best to vary the type of nuts you eat to obtain a variety of nutrients. Peanuts are a legume and do not fall into this category.  Olive oil: Use olive oil as your main cooking oil. There may be other heart-healthy alternatives such as algae oil, though there is not yet sufficient research upon which to base a formal recommendation.  Whole grains: Aim for at least three servings daily. Choose  minimally processed grains like oatmeal, quinoa, brown rice, whole-wheat pasta and 100% whole-wheat bread.  Fish: Eat fish at least once a week. It is best to choose fatty fish like salmon, sardines, trout, tuna and mackerel for their high amounts of omega-3 fatty acids.  Beans: Include beans in at least four meals every week. This includes all beans, lentils and soybeans.  Poultry: Try to eat chicken or [place] at least twice a week. Note that fried chicken is  not encouraged on the MIND diet.  Wine: Aim for no more than one glass of alcohol daily. Both red and white wine may benefit the brain. However, much research has focused on the red wine compound resveratrol, which may help protect against Alzheimer's disease.  Foods that are DISCOURAGED on the MIND Diet: Butter and margarine: Try to eat less than 1 tablespoon (about 14 grams) daily. Instead, try using olive oil as your primary cooking fat, and dipping your bread in olive oil with herbs.  Cheese: The MIND diet recommends limiting your cheese consumption to less than once per week.  Red meat: Aim for no more than three servings each week. This includes all beef, pork, lamb and products made from these meats.  [place] food: The MIND diet highly discourages fried food, especially the kind from fast-food restaurants. Limit your consumption to less than once per week.  Pastries and sweets: This includes most of the processed junk food and desserts you can think of. Ice cream, cookies, brownies, snack cakes, donuts, candy and more. Try to limit these to no more than four times a week.  Exercise is one of the best medicines for promoting health and maintaining cognitive fitness at all stages in life. Exercise probably has the largest documented effect on brain health and performance of any lifestyle intervention. Studies have shown that even previously sedentary individuals who start exercising as late as age 16 show a significant survival benefit as compared to their non-exercising peers. In the [place], the current guidelines are for 30 minutes of moderate exercise per day, but increasing your activity level less than that may also be helpful. You do not have to get your 30 minutes of exercise in one shot and exercising for short periods of time spread throughout the day can be helpful. Go for several walks, learn to dance, or do something else you enjoy that gets your body moving. Of course, if you have an  underlying medical condition or there is any question about whether it is safe for you to exercise, you should consult a medical treatment provider prior to beginning exercise.   Staying active mentally is crucially important. This can include formal types of brain training, such as cognitive rehabilitation, sudoku, crossword puzzles and the like, although the best thing is to stay active and engaged in your life. This includes having meaningful hobbies and interests that you pursue and especially cultivating satisfying social relationships, which are a form of cognitive stimulation and have been shown to be protective in and of themselves.    Test Findings  Test scores are summarized in additional documentation associated with this encounter. Test scores are relative to age, gender, and educational history as available and appropriate. There were no concerns about performance validity as all findings fell within normal expectations.   General Intellectual Functioning/Achievement:  Performance on single word reading at the previous assessment was average, which was used in conjunction with the patients cognitive test data as a basis of comparison for his cognitive  test scores.   Attention and Processing Efficiency: Indicators of attention and processing efficiency generated an average score for digits forward and a low average score for digits backward, which are better than his previous scores by one qualitative classification level. Timed number symbol coding on the other hand was unusually low, which is a bit weaker (but still potentially within measurement error) as compared to his previous performance.   Language: Language findings show normal visual object confrontation naming, similar to the previous assessment. Generation of words, however, was unusually low in response to letter prompts and the category "animals," which is lower than his previous scores.   Visuospatial Function: Performance  on visuospatial and constructional indicators was better than at the previous assessment with an average score at an index level. Figure copy was errorless and his judgment of angular line orientations was low average.   Learning and Memory: Performance on indicators of memory and learning was marginally below expectations for an individual of average overall ability. He had difficulties primarily with encoding in the setting of adequate retention of information across time. This is somewhat weaker than his previous performance considering memory as a whole.   Performance on word list learning was unusually low with 4, 5, and 6 words of a 12-item word list across three learning trials. Following a standard delay, he retained 4 words, which is unusually low but he recalled 6 words at long delayed recall, which is average. Yes/no discriminability for the words from the list versus foils was average. Memory for a short story was unusually low on immediate recall with low average delayed recall. Retention across time was average. Memory for daily living information was superior, although that is still a bit lower than his previous score, which was very superior. His delayed recall was high average. Recognition for the information was average.   Delayed recall of a modestly complex figure was very good and in the high average range, potentially representing practice effect.   Executive Functions: Performance on executive indicators was similar to if not a bit better than his previous performance. His Executive Function Composite on the [employer] was toward the upper end of the average range, whereas previously he achieved a low average score. His performance was better for both categories completed and perseverative errors. Alternating sequencing of numbers and letters of the alphabet was low average, exactly the same as his previous score. He performed errorlessly when reasoning with  complex verbal information. Clock drawing was normal whereas his previous score was mildly impaired.   Rating Scale(s): Joel Webb screened negative for the presence of depression. His wife is rating his function as equivalent to that of a mild to moderate dementia, which is almost twice as high as her previous rating  Joel Webb. Nicole Kindred, PsyD, ABN Clinical Neuropsychologist  Coding and Compliance  Billing below reflects technician time, my direct face-to-face time with the patient, time spent in test administration, and time spent in professional activities including but not limited to: neuropsychological test interpretation, integration of neuropsychological test data with clinical history, report preparation, treatment planning, care coordination, and review of diagnostically pertinent medical history or studies.   Services associated with this encounter: Clinical Interview [phone]) plus 160 minutes ([id number]; Neuropsychological Evaluation by Professional)  80 minutes ([id number]; Neuropsychological Testing by Technician)

## 2021-03-05 ENCOUNTER — Ambulatory Visit (INDEPENDENT_AMBULATORY_CARE_PROVIDER_SITE_OTHER): Payer: Medicare HMO | Admitting: Counselor

## 2021-03-05 ENCOUNTER — Other Ambulatory Visit: Payer: Self-pay

## 2021-03-05 ENCOUNTER — Encounter: Payer: Self-pay | Admitting: Counselor

## 2021-03-05 DIAGNOSIS — G3184 Mild cognitive impairment, so stated: Secondary | ICD-10-CM | POA: Diagnosis not present

## 2021-03-05 DIAGNOSIS — F4323 Adjustment disorder with mixed anxiety and depressed mood: Secondary | ICD-10-CM | POA: Diagnosis not present

## 2021-03-05 NOTE — Patient Instructions (Signed)
Your performance and presentation was fairly similar to the last assessment with some areas of minor change. You are now demonstrating mildly abnormal memory performance and possible declines in other areas. On the other hand, you did better in some domains, such as on select measures of executive function and also on measures of visuospatial abilities. Given these findings and the fact that there have been some minor changes in functional status (e.g., difficulty doing finances), I do think a diagnosis of mild cognitive impairment is appropriate.   The major difference between mild cognitive impairment (MCI) and dementia is in severity and potential prognosis. Once someone reaches a level of severity adequate to be diagnosed with a dementia, there is usually progression over time, though this may be years. On the other hand, mild cognitive impairment, while a significant risk for dementia in future, does not always progress to dementia, and in some instances stays the same or can even revert to normal.It is important to realize that if MCI is due to underlying Alzheimer's disease, it will most likely progress to dementia eventually. The rate of conversion to Alzheimer's dementiafrom amnestic MCI is about 15% per year versus the general population risk of conversion of 2% per year.  I remain unconvinced that your mild cognitive issues are due to a prodromal degenerative condition, although it is of course possible. As you are aware, the primary concern was FTD, but you have only very vague signs and symptoms associated with that condition. Individuals with bvFTD by definition show profound behavior changes including inappropriate, apathetic, and disinhibited behaviors. These behaviors must represent a definite and significant change for the individual and be impairing to their day-to-day functioning. In your case, you continue to have a preference for a routine oriented life, eat too many sweets, and to  have a low activity level although those are also common in the normal healthy population and are not necessarily a sign that you have a brain disease.   If diagnostic clarity is needed, I would recommend that you pursue further evaluation. The study of choice would likely be a metabolic PET scan, which is a special type of image that allows [place] to see functional changes in your brains energy uses. Sometimes, this study can change prior to actual structural changes that are visible on MRI. Neuropsychological testing has low sensitivity for behavioral variant FTD and your clinical signs and symptoms are not sufficient for diagnosis at this point in time.   The mainstay for treatment of mild cognitive impairment is lifestyle changes. Some studies have shown that adhering to the MIND diet, in particular, can result in differences in cognitive decline equivalent to being 7.5 years younger in age.   There is now good quality evidence from at least one large scale study that a modified mediterranean diet may help slow cognitive decline. This is known as the "MIND" diet. The Mind diet is not so much a specific diet as it is a set of recommendations for things that you should and should not eat.   Foods that are ENCOURAGED on the MIND Diet:  Green, leafy vegetables: Aim for six or more servings per week. This includes kale, spinach, cooked greens and salads.  All other vegetables: Try to eat another vegetable in addition to the green leafy vegetables at least once a day. It is best to choose non-starchy vegetables because they have a lot of nutrients with a low number of calories.  Berries: Eat berries at least twice a week. There  is a plethora of research on strawberries, and other berries such as blueberries, raspberries and blackberries have also been found to have antioxidant and brain health benefits.  Nuts: Try to get five servings of nuts or more each week. The creators of the [place] don't specify  what kind of nuts to consume, but it is probably best to vary the type of nuts you eat to obtain a variety of nutrients. Peanuts are a legume and do not fall into this category.  Olive oil: Use olive oil as your main cooking oil. There may be other heart-healthy alternatives such as algae oil, though there is not yet sufficient research upon which to base a formal recommendation.  Whole grains: Aim for at least three servings daily. Choose minimally processed grains like oatmeal, quinoa, brown rice, whole-wheat pasta and 100% whole-wheat bread.  Fish: Eat fish at least once a week. It is best to choose fatty fish like salmon, sardines, trout, tuna and mackerel for their high amounts of omega-3 fatty acids.  Beans: Include beans in at least four meals every week. This includes all beans, lentils and soybeans.  Poultry: Try to eat chicken or [place] at least twice a week. Note that fried chicken is not encouraged on the MIND diet.  Wine: Aim for no more than one glass of alcohol daily. Both red and white wine may benefit the brain. However, much research has focused on the red wine compound resveratrol, which may help protect against Alzheimer's disease.  Foods that are DISCOURAGED on the MIND Diet: Butter and margarine: Try to eat less than 1 tablespoon (about 14 grams) daily. Instead, try using olive oil as your primary cooking fat, and dipping your bread in olive oil with herbs.  Cheese: The MIND diet recommends limiting your cheese consumption to less than once per week.  Red meat: Aim for no more than three servings each week. This includes all beef, pork, lamb and products made from these meats.  [place] food: The MIND diet highly discourages fried food, especially the kind from fast-food restaurants. Limit your consumption to less than once per week.  Pastries and sweets: This includes most of the processed junk food and desserts you can think of. Ice cream, cookies, brownies, snack cakes, donuts,  candy and more. Try to limit these to no more than four times a week.  Exercise is one of the best medicines for promoting health and maintaining cognitive fitness at all stages in life. Exercise probably has the largest documented effect on brain health and performance of any lifestyle intervention. Studies have shown that even previously sedentary individuals who start exercising as late as age 55 show a significant survival benefit as compared to their non-exercising peers. In the [place], the current guidelines are for 30 minutes of moderate exercise per day, but increasing your activity level less than that may also be helpful. You do not have to get your 30 minutes of exercise in one shot and exercising for short periods of time spread throughout the day can be helpful. Go for several walks, learn to dance, or do something else you enjoy that gets your body moving. Of course, if you have an underlying medical condition or there is any question about whether it is safe for you to exercise, you should consult a medical treatment provider prior to beginning exercise.   Staying active mentally is crucially important. This can include formal types of brain training, such as cognitive rehabilitation, sudoku, crossword puzzles and  the like, although the best thing is to stay active and engaged in your life. This includes having meaningful hobbies and interests that you pursue and especially cultivating satisfying social relationships, which are a form of cognitive stimulation and have been shown to be protective in and of themselves.

## 2021-03-05 NOTE — Progress Notes (Signed)
NEUROPSYCHOLOGY FEEDBACK NOTE [facility]  Feedback Note: I met with Joel Webb to review the findings resulting from his neuropsychological evaluation. Since the last appointment, he has been about the same. Time was spent reviewing the impressions and recommendations that are detailed in the evaluation report. We discussed impression of mild cognitive impairment, although similar to the last time the pattern is not impressive and does not look like what I would expect in AD or necessarily FTD. I was candid with them that I think it will be challenging to answer their question on clinical grounds, as the patient does not have florid FTD type behaviors nor a clearly convincing cognitive profile. We discussed the role of additional diagnostic studies, such as a metabolic PET, which they could also discuss with Dr. Leta Baptist. Another very reasonable option would be treating their situation systematically, with further couples counseling, which I recommended. I took time to explain the findings and answer all the patient's questions. I encouraged Joel Webb to contact me should he have any further questions or if further follow up is desired.   Current Medications and Medical History   Current Outpatient Medications  Medication Sig Dispense Refill  . amLODipine (NORVASC) 5 MG tablet     . atorvastatin (LIPITOR) 80 MG tablet Take 80 mg by mouth daily.    . BD INSULIN SYRINGE U/F 31G X 5/16" 1 ML MISC USE TO GIVE 2 INSULIN INJECTIONS DAILY 200 each 5  . Dulaglutide (TRULICITY) 1.5 MC/9.4BS SOPN Inject once weekly 6 mL 1  . DULoxetine (CYMBALTA) 60 MG capsule 60 mg daily.    . finasteride (PROSCAR) 5 MG tablet     . glucose blood (ACCU-CHEK SMARTVIEW) test strip Use to check blood sugar 1 time per day. 50 each 3  . insulin degludec (TRESIBA FLEXTOUCH) 200 UNIT/ML FlexTouch Pen INJECT 40 UNITS UNDER THE SKIN DAILY 15 mL 1  . Insulin Pen Needle (PEN NEEDLES) 31G X 6 MM MISC Use to inject  insulin daily. 100 each 2  . insulin regular (NOVOLIN R RELION) 100 units/mL injection Inject 0.08 mLs (8 Units total) into the skin 2 (two) times daily before a meal. Inject 10 units under the skin once daily at supper (Patient taking differently: Inject 8 Units into the skin 2 (two) times daily before a meal. Patient injecting 6 units in the morning 8 units before lunch and 10 units before supper) 10 mL 3  . metFORMIN (GLUCOPHAGE) 1000 MG tablet TAKE ONE TABLET BY MOUTH TWICE A DAY WITH MEALS 180 tablet 0  . Multiple Vitamin (MULTIVITAMIN WITH MINERALS) TABS tablet Take 1 tablet by mouth daily.    Marland Kitchen olmesartan-hydrochlorothiazide (BENICAR HCT) 40-12.5 MG tablet     . Omega-3 Fatty Acids (FISH OIL) 1000 MG CAPS Take by mouth. 2x/day    . omeprazole (PRILOSEC) 20 MG capsule Take 20 mg by mouth every other day. PRN    . oxybutynin (DITROPAN-XL) 5 MG 24 hr tablet 06/12/20 not started    . tamsulosin (FLOMAX) 0.4 MG CAPS capsule Take 0.4 mg by mouth. Reported on 05/29/2016    . UNABLE TO FIND Med Name: Prevagen     No current facility-administered medications for this visit.    Patient Active Problem List   Diagnosis Date Noted  . Microalbuminuria due to type 2 diabetes mellitus ([place]) 05/25/2020  . Essential hypertension 06/05/2017  . Uncontrolled type 2 diabetes mellitus with hyperglycemia, with long-term current use of insulin ([place]) 02/28/2016  . Mixed hyperlipidemia  02/28/2016    Mental Status and Behavioral Observations  Joel Webb presented on time to the present encounter and was alert and generally oriented. Speech was normal in rate, rhythm, volume, and prosody. Self-reported mood was "fine" and affect was neutral. Thought process was logical and goal orietned and thought content was appropriate to the topics discussed. There were no safety concerns identified at today's encounter, such as thoughts of harming self or others.   Plan  Feedback provided regarding the patient's  neuropsychological evaluation. I was candid with them that it may be challenging to find a definitive diagnosis on clinical grounds in his case, although he is welcome to return for repeat evaluation in 1 to 2 year as needed. Joel Webb was encouraged to contact me if any questions arise or if further follow up is desired.   Joel Webb Joel Kindred, PsyD, ABN Clinical Neuropsychologist  Service(s) Provided at This Encounter: 55 minutes [phone]; Individual therapy with patient)

## 2021-03-26 ENCOUNTER — Other Ambulatory Visit (INDEPENDENT_AMBULATORY_CARE_PROVIDER_SITE_OTHER): Payer: Medicare HMO

## 2021-03-26 ENCOUNTER — Other Ambulatory Visit: Payer: Self-pay

## 2021-03-26 DIAGNOSIS — E1165 Type 2 diabetes mellitus with hyperglycemia: Secondary | ICD-10-CM | POA: Diagnosis not present

## 2021-03-26 DIAGNOSIS — Z794 Long term (current) use of insulin: Secondary | ICD-10-CM

## 2021-03-26 LAB — BASIC METABOLIC PANEL
BUN: 35 mg/dL — ABNORMAL HIGH (ref 6–23)
CO2: 27 mEq/L (ref 19–32)
Calcium: 9.4 mg/dL (ref 8.4–10.5)
Chloride: 106 mEq/L (ref 96–112)
Creatinine, Ser: 1.62 mg/dL — ABNORMAL HIGH (ref 0.40–1.50)
GFR: 41.58 mL/min — ABNORMAL LOW (ref >60.00)
Glucose, Bld: 121 mg/dL — ABNORMAL HIGH (ref 70–99)
Potassium: 4.7 mEq/L (ref 3.5–5.1)
Sodium: 140 mEq/L (ref 135–145)

## 2021-03-26 LAB — HEMOGLOBIN A1C: Hgb A1c MFr Bld: 7.1 % — ABNORMAL HIGH (ref 4.6–6.5)

## 2021-03-27 DIAGNOSIS — H02831 Dermatochalasis of right upper eyelid: Secondary | ICD-10-CM | POA: Diagnosis not present

## 2021-03-27 DIAGNOSIS — H02413 Mechanical ptosis of bilateral eyelids: Secondary | ICD-10-CM | POA: Diagnosis not present

## 2021-03-27 DIAGNOSIS — D2310 Other benign neoplasm of skin of unspecified eyelid, including canthus: Secondary | ICD-10-CM | POA: Diagnosis not present

## 2021-03-27 DIAGNOSIS — D485 Neoplasm of uncertain behavior of skin: Secondary | ICD-10-CM | POA: Diagnosis not present

## 2021-03-27 DIAGNOSIS — D23122 Other benign neoplasm of skin of left lower eyelid, including canthus: Secondary | ICD-10-CM | POA: Diagnosis not present

## 2021-03-27 DIAGNOSIS — H57813 Brow ptosis, bilateral: Secondary | ICD-10-CM | POA: Diagnosis not present

## 2021-03-27 DIAGNOSIS — H02834 Dermatochalasis of left upper eyelid: Secondary | ICD-10-CM | POA: Diagnosis not present

## 2021-03-29 ENCOUNTER — Other Ambulatory Visit: Payer: Self-pay

## 2021-03-29 ENCOUNTER — Ambulatory Visit: Payer: Medicare HMO | Admitting: Endocrinology

## 2021-03-29 ENCOUNTER — Other Ambulatory Visit: Payer: Self-pay | Admitting: *Deleted

## 2021-03-29 ENCOUNTER — Encounter: Payer: Self-pay | Admitting: Endocrinology

## 2021-03-29 VITALS — BP 128/84 | HR 58 | Ht 69.0 in | Wt 182.2 lb

## 2021-03-29 DIAGNOSIS — Z794 Long term (current) use of insulin: Secondary | ICD-10-CM | POA: Diagnosis not present

## 2021-03-29 DIAGNOSIS — N183 Chronic kidney disease, stage 3 unspecified: Secondary | ICD-10-CM | POA: Diagnosis not present

## 2021-03-29 DIAGNOSIS — E1129 Type 2 diabetes mellitus with other diabetic kidney complication: Secondary | ICD-10-CM

## 2021-03-29 DIAGNOSIS — E1165 Type 2 diabetes mellitus with hyperglycemia: Secondary | ICD-10-CM | POA: Diagnosis not present

## 2021-03-29 DIAGNOSIS — R809 Proteinuria, unspecified: Secondary | ICD-10-CM | POA: Diagnosis not present

## 2021-03-29 MED ORDER — TRESIBA FLEXTOUCH 200 UNIT/ML ~~LOC~~ SOPN: PEN_INJECTOR | SUBCUTANEOUS | 1 refills | Status: DC

## 2021-03-29 MED ORDER — ACCU-CHEK GUIDE W/DEVICE KIT: PACK | 0 refills | Status: DC

## 2021-03-29 MED ORDER — ACCU-CHEK GUIDE VI STRP: ORAL_STRIP | 1 refills | Status: DC

## 2021-03-29 MED ORDER — METFORMIN HCL 1000 MG PO TABS: 1.0000 | ORAL_TABLET | Freq: Two times a day (BID) | ORAL | 0 refills | Status: DC

## 2021-03-29 MED ORDER — INSULIN REGULAR HUMAN 100 UNIT/ML IJ SOLN: 8.0000 [IU] | Freq: Two times a day (BID) | INTRAMUSCULAR | 3 refills | Status: DC

## 2021-03-29 MED ORDER — TRULICITY 1.5 MG/0.5ML ~~LOC~~ SOAJ: SUBCUTANEOUS | 1 refills | Status: DC

## 2021-03-29 NOTE — Progress Notes (Signed)
Patient ID: Joel Webb, male   DOB: 1946/03/12, 75 y.o.   MRN: [id number]           Reason for Appointment: Follow-up for Type 2 Diabetes and related problems    Referring physician: Dr. Moreen Fowler  History of Present Illness:          Date of diagnosis of type 2 diabetes mellitus: 2007       Background history:   He is unclear when he was diagnosed to have diabetes but was not very symptomatic He had been on metformin alone for several years with reportedly good control Also had previously been very consistent with exercise and diet His blood sugars probably started going up 3-4 years ago and he was given Lantus insulin in addition He was seen in consultation in 4/17 with an A1c of 8.4 and he was started Trulicity in addition to his Lantus and metformin This was stopped in 7/17 because of high out-of-pocket expense  The V-go pump was stopped because he found it too uncomfortable and not sticking well  Recent history:   INSULIN regimen is: Tresiba 40 units in p.m., Novolin R 6 at breakfast and 8  lunchtime; 10 units at supper  Non-insulin hypoglycemic drugs the patient is taking are: metformin 1 g twice a day, Trulicity 1.5 mg weekly   Current management, blood sugar patterns and problems identified:   His A1c is much better at 6.7 compared to 7.7   He again did bring his glucose monitor and states that he is getting error messages now  His wife is here with him today and she is concerned that he is not taking his insulin on time and likely forgets to check his sugars  He thinks his blood sugars are about 127 in the morning and about 154 later in the day but not clear how often he is checking, usually not checking after dinner  He is not doing as much exercise, is relatively 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  His wife is asking about his insulin regimen and reason for taking multiple injections, Trulicity and metformin  Not clear  if he has had any hypoglycemia, may have had an episode of confusion once  Tyler Aas has been taken fairly consistently in the evenings, tries to take it with his dinner meal  Glucose monitoring:  done less than 1 times a day         Glucometer:  Accu-Chek  Blood Glucose readings not available, previously readings from patient review of monitor from home:  11/01/20 902AM 124   11/01/20 707PM 123   11/02/20 1000AM 117   11/03/20 1226PM 225   11/03/20 136PM 145   11/04/20 102PM 127   11/19/20 837AM 127   11/20/20 935PM 154   11/27/20 1000PM 143   11/29/20 900AM 118      Self-care:  Usually tries to limit High-fat foods and portions                 Dietician visit, most recent:6/17                Weight history:  Wt Readings from Last 3 Encounters:  03/29/21 182 lb 3.2 oz (82.6 kg)  11/30/20 187 lb 9.6 oz (85.1 kg)  10/16/20 187 lb (84.8 kg)    Glycemic control:   Lab Results  Component Value Date   HGBA1C 7.1 (H) 03/26/2021   HGBA1C 6.7 (H) 11/07/2020   HGBA1C  7.7 (H) 05/23/2020   Lab Results  Component Value Date   MICROALBUR 30.0 (H) 11/28/2020   LDLCALC 97 02/02/2016   CREATININE 1.62 (H) 03/26/2021   Lab Results  Component Value Date   MICRALBCREAT 33.3 (H) 11/28/2020    Lab Results  Component Value Date   FRUCTOSAMINE 248 08/08/2020   FRUCTOSAMINE 266 06/02/2017   FRUCTOSAMINE 233 02/04/2017     Other problems addressed today: See review of systems   Lab on 03/26/2021  Component Date Value Ref Range Status  . Sodium 03/26/2021 140  135 - 145 mEq/L Final  . Potassium 03/26/2021 4.7  3.5 - 5.1 mEq/L Final  . Chloride 03/26/2021 106  96 - 112 mEq/L Final  . CO2 03/26/2021 27  19 - 32 mEq/L Final  . Glucose, Bld 03/26/2021 121* 70 - 99 mg/dL Final  . BUN 03/26/2021 35* 6 - 23 mg/dL Final  . Creatinine, Ser 03/26/2021 1.62* 0.40 - 1.50 mg/dL Final  . GFR 03/26/2021 41.58* >60.00 mL/min Final   Calculated using the CKD-EPI Creatinine Equation  (2021)  . Calcium 03/26/2021 9.4  8.4 - 10.5 mg/dL Final  . Hgb A1c MFr Bld 03/26/2021 7.1* 4.6 - 6.5 % Final   Glycemic Control Guidelines for People with Diabetes:Non Diabetic:  <6%Goal of Therapy: <7%Additional Action Suggested:  >8%       Allergies as of 03/29/2021   No Known Allergies     Medication List       Accurate as of Mar 29, 2021  9:25 AM. If you have any questions, ask your nurse or doctor.        Accu-Chek SmartView test strip Generic drug: glucose blood Use to check blood sugar 1 time per day.   amLODipine 5 MG tablet Commonly known as: NORVASC   atorvastatin 80 MG tablet Commonly known as: LIPITOR Take 80 mg by mouth daily.   BD Insulin Syringe U/F 31G X 5/16" 1 ML Misc Generic drug: Insulin Syringe-Needle U-100 USE TO GIVE 2 INSULIN INJECTIONS DAILY   DULoxetine 60 MG capsule Commonly known as: CYMBALTA 60 mg daily.   finasteride 5 MG tablet Commonly known as: PROSCAR   Fish Oil 1000 MG Caps Take by mouth. 2x/day   insulin regular 100 units/mL injection Commonly known as: NovoLIN R ReliOn Inject 0.08 mLs (8 Units total) into the skin 2 (two) times daily before a meal. Inject 10 units under the skin once daily at supper What changed: additional instructions   metFORMIN 1000 MG tablet Commonly known as: GLUCOPHAGE TAKE ONE TABLET BY MOUTH TWICE A DAY WITH MEALS   multivitamin with minerals Tabs tablet Take 1 tablet by mouth daily.   olmesartan-hydrochlorothiazide 40-12.5 MG tablet Commonly known as: BENICAR HCT   omeprazole 20 MG capsule Commonly known as: PRILOSEC Take 20 mg by mouth every other day. PRN   oxybutynin 5 MG 24 hr tablet Commonly known as: DITROPAN-XL 06/12/20 not started   Pen Needles 31G X 6 MM Misc Use to inject insulin daily.   tamsulosin 0.4 MG Caps capsule Commonly known as: FLOMAX Take 0.4 mg by mouth. Reported on 05/29/2016   Tyler Aas FlexTouch 200 UNIT/ML FlexTouch Pen Generic drug: insulin degludec INJECT  40 UNITS UNDER THE SKIN DAILY   Trulicity 1.5 IR/6.7EL Sopn Generic drug: Dulaglutide Inject once weekly   UNABLE TO FIND Med Name: Prevagen       Allergies: No Known Allergies  Past Medical History:  Diagnosis Date  . CKD (chronic kidney disease), stage III (  [place])   . Depression   . Diabetes mellitus without complication ([place])   . ED (erectile dysfunction)   . GERD (gastroesophageal reflux disease)   . Hyperlipidemia   . Hypertension   . Plantar fasciitis   . Prostatitis     Past Surgical History:  Procedure Laterality Date  . CIRCUMCISION    . COLONOSCOPY    . MOUTH SURGERY  01/2020    Family History  Problem Relation Age of Onset  . Hepatitis C Mother   . Mesothelioma Father   . Diabetes Father   . Heart disease Father     Social History:  reports that he quit smoking about 12 years ago. He has never used smokeless tobacco. He reports current alcohol use. He reports that he does not use drugs.    Review of Systems     RENAL dysfunction: Creatinine is somewhat variable and not clear why it is higher now  Lab Results  Component Value Date   CREATININE 1.62 (H) 03/26/2021   CREATININE 1.40 11/28/2020   CREATININE 1.37 11/07/2020    Lipid history: Has been on Lipitor 80 mg for cholesterol control from his PCP  Labs show LDL 89 and 12/21 with triglycerides 156    Lab Results  Component Value Date   CHOL 129 12/24/2016   HDL 23.70 (L) 12/24/2016   LDLCALC 97 02/02/2016   LDLDIRECT 84.0 04/07/2020   TRIG 210.0 (H) 12/24/2016   CHOLHDL 5 12/24/2016           Hypertension:Long-standing, treated with Benicar HCT 40/12.5, amlodipine 5 mg daily, followed by PCP   Does monitor periodically at home but does not member his readings  Previously on losartan with high normal blood pressure readings and his PCP was recommended switching losartan to Benicar With this his urine microalbumin is only 33 compared to about 70   BP Readings from Last 3  Encounters:  03/29/21 128/84  11/30/20 132/78  10/16/20 (!) 145/86    Review of Systems   Most recent eye exam was in 09/2020, no retinopathy  Most recent foot exam: 05/2020    Physical Examination:  BP 128/84   Pulse (!) 58   Ht 5\' 9"  (1.753 m)   Wt 182 lb 3.2 oz (82.6 kg)   SpO2 99%   BMI 26.91 kg/m     ASSESSMENT:  Diabetes type 2, on insulin  See history of present illness for  discussion of current diabetes management, blood sugar patterns and problems identified  His A1c is still at a good level at 7.1  A1c previously had improved with the higher dose of Trulicity 1.5 mg His wife is now reporting that he is inconsistent with his insulin and because of his memory issues may not be taking his regular insulin on time also Unable to review his blood sugar meter again May have had hypoglycemia once with taking his insulin late Also is somewhat more active on the days he is working in the afternoons  Hypertension with microalbuminuria: Controlled, high normal diastolic today  Microalbumin had improved with switching to Benicar  Renal dysfunction: Creatinine is relatively higher and will forward results to PCP   PLAN:     Reminded him to take his Novolin regular insulin 30 minutes before eating or at least 15 minutes  His wife was explained the difference between basal and bolus insulin, and control of postprandial spikes and the proper timing of insulin  Also discussed how Trulicity and metformin improved blood sugars  No change in [place], Trulicity doses  Metformin will be reduced to 500 mg in the evening until renal function improved  May benefit from using freestyle Russell or other sensor but doubt if he will remember to check it frequently, may be also concerned about cost  Consultation with diabetes educator for detailed diabetes education especially with informing his wife about general diabetes information, insulin and monitoring   Follow-up in 3  months  There are no Patient Instructions on file for this visit.   Elayne Snare 03/29/2021, 9:25 AM   Note: This office note was prepared with Dragon voice recognition system technology. Any transcriptional errors that result from this process are unintentional. Total Time including counseling = 30 minutes

## 2021-03-29 NOTE — Patient Instructions (Addendum)
Metformin 1/2 in pm and 1 in am  Check blood sugars on waking up 3 days a week  Also check blood sugars about 2 hours after meals and do this after different meals by rotation  Recommended blood sugar levels on waking up are 90-130 and about 2 hours after meal is 130-180  Please bring your blood sugar monitor to each visit, thank you  R insulin 15-30 min before meals  Take 12 units before supper  On work days take 5 units at lunch

## 2021-04-02 ENCOUNTER — Other Ambulatory Visit: Payer: Self-pay | Admitting: *Deleted

## 2021-04-14 DIAGNOSIS — R300 Dysuria: Secondary | ICD-10-CM | POA: Diagnosis not present

## 2021-04-14 DIAGNOSIS — N410 Acute prostatitis: Secondary | ICD-10-CM | POA: Diagnosis not present

## 2021-04-20 ENCOUNTER — Other Ambulatory Visit: Payer: Self-pay

## 2021-04-20 MED ORDER — NOVOLOG FLEXPEN 100 UNIT/ML ~~LOC~~ SOPN: 8.0000 [IU] | PEN_INJECTOR | Freq: Two times a day (BID) | SUBCUTANEOUS | 3 refills | Status: DC

## 2021-04-24 NOTE — Telephone Encounter (Signed)
I am scheduled to see him tomorrow

## 2021-04-25 ENCOUNTER — Other Ambulatory Visit: Payer: Self-pay

## 2021-04-25 ENCOUNTER — Encounter: Payer: Medicare HMO | Attending: Endocrinology | Admitting: Nutrition

## 2021-04-25 DIAGNOSIS — E1165 Type 2 diabetes mellitus with hyperglycemia: Secondary | ICD-10-CM | POA: Diagnosis not present

## 2021-04-25 DIAGNOSIS — Z794 Long term (current) use of insulin: Secondary | ICD-10-CM | POA: Diagnosis not present

## 2021-04-25 NOTE — Progress Notes (Signed)
Wife is here with her husband to learn about the insulin he takes, how to give the insulin, test his blood sugar and treat lows.  This was all discussed with her.  She also had questions about the Trulicity, what it is, and how to inject and when to take this Written instructions given for all of his insulins and medication taken, both the names, dosages, and times to take this.   We also discussed low blood sugars, symptoms and treatments.  They both reported good understanding of this. Diet:  Typical day: 7:30 bfast:  Cookies 2-3, water to drink, or cold cereal and milk 10 AM snacks on cracker, or "whatever he can find" 1PM: lunch of sandwich, or soup, or leftovers from supper the night before  3PM: snacks on nuts, crackers and cheese 6-7: supper, 6-7 ounces protein 2 servings of carbs, and one non starchy veg. 9PM snacks occasionally. 10-11 bed  SBGM.  Wife was shown how to test his blood sugars at her request. Blood sugar was 189 2hr. pc cookies.  Per Dr. Ronnie Derby voice order, patient was told to not take R insulin before breakfast.  Asked about using the [facility].  Patient's phone is not capable of downloading the [place] app.  She was told to go on line with the website and get a free reader and sensor.  She agreed to do this and call me when she gets this for training. Plan: 1.  No cold cereal, eat 2 pieces of toast with peanut butter or cheese 2.  Wait 30-45 min. After taking insulin before eating meals 3.  Take no R insulin before breakfast

## 2021-04-25 NOTE — Patient Instructions (Signed)
1, No cold cereal, eat 2 pieces of toast with peanut butter or cheese 2.  Wait 30-45 min. After taking insulin before eating meals 3.  Take no R insulin before breakfast 4.  Call when you get your reader and libre sensor.

## 2021-04-27 ENCOUNTER — Other Ambulatory Visit: Payer: Self-pay

## 2021-04-27 DIAGNOSIS — Z794 Long term (current) use of insulin: Secondary | ICD-10-CM

## 2021-04-27 MED ORDER — FREESTYLE LIBRE 2 READER DEVI: 0 refills | Status: DC

## 2021-04-27 MED ORDER — FREESTYLE LIBRE 2 SENSOR MISC: 3 refills | Status: DC

## 2021-04-30 ENCOUNTER — Telehealth: Payer: Self-pay | Admitting: Endocrinology

## 2021-04-30 NOTE — Telephone Encounter (Signed)
Pt came in 04/30/2021 at 12:07 to bring in patient assistance for [employer]. Put in Dr.Kumar's tray at front desk

## 2021-05-09 DIAGNOSIS — E1122 Type 2 diabetes mellitus with diabetic chronic kidney disease: Secondary | ICD-10-CM | POA: Diagnosis not present

## 2021-05-09 DIAGNOSIS — E119 Type 2 diabetes mellitus without complications: Secondary | ICD-10-CM | POA: Diagnosis not present

## 2021-05-09 DIAGNOSIS — K219 Gastro-esophageal reflux disease without esophagitis: Secondary | ICD-10-CM | POA: Diagnosis not present

## 2021-05-09 DIAGNOSIS — I10 Essential (primary) hypertension: Secondary | ICD-10-CM | POA: Diagnosis not present

## 2021-05-09 DIAGNOSIS — E782 Mixed hyperlipidemia: Secondary | ICD-10-CM | POA: Diagnosis not present

## 2021-05-09 DIAGNOSIS — N400 Enlarged prostate without lower urinary tract symptoms: Secondary | ICD-10-CM | POA: Diagnosis not present

## 2021-05-09 DIAGNOSIS — F339 Major depressive disorder, recurrent, unspecified: Secondary | ICD-10-CM | POA: Diagnosis not present

## 2021-05-09 DIAGNOSIS — N1831 Chronic kidney disease, stage 3a: Secondary | ICD-10-CM | POA: Diagnosis not present

## 2021-05-11 NOTE — Telephone Encounter (Signed)
Form faxed

## 2021-06-07 ENCOUNTER — Other Ambulatory Visit: Payer: Self-pay | Admitting: Endocrinology

## 2021-06-07 DIAGNOSIS — N400 Enlarged prostate without lower urinary tract symptoms: Secondary | ICD-10-CM | POA: Diagnosis not present

## 2021-06-07 DIAGNOSIS — E782 Mixed hyperlipidemia: Secondary | ICD-10-CM | POA: Diagnosis not present

## 2021-06-07 DIAGNOSIS — K219 Gastro-esophageal reflux disease without esophagitis: Secondary | ICD-10-CM | POA: Diagnosis not present

## 2021-06-07 DIAGNOSIS — N1831 Chronic kidney disease, stage 3a: Secondary | ICD-10-CM | POA: Diagnosis not present

## 2021-06-07 DIAGNOSIS — F339 Major depressive disorder, recurrent, unspecified: Secondary | ICD-10-CM | POA: Diagnosis not present

## 2021-06-07 DIAGNOSIS — D692 Other nonthrombocytopenic purpura: Secondary | ICD-10-CM | POA: Diagnosis not present

## 2021-06-07 DIAGNOSIS — I10 Essential (primary) hypertension: Secondary | ICD-10-CM | POA: Diagnosis not present

## 2021-06-07 DIAGNOSIS — E1122 Type 2 diabetes mellitus with diabetic chronic kidney disease: Secondary | ICD-10-CM | POA: Diagnosis not present

## 2021-06-07 DIAGNOSIS — G319 Degenerative disease of nervous system, unspecified: Secondary | ICD-10-CM | POA: Diagnosis not present

## 2021-06-25 DIAGNOSIS — E1165 Type 2 diabetes mellitus with hyperglycemia: Secondary | ICD-10-CM

## 2021-06-25 MED ORDER — METFORMIN HCL 1000 MG PO TABS: 1000.0000 mg | ORAL_TABLET | Freq: Two times a day (BID) | ORAL | 2 refills | Status: DC

## 2021-07-02 ENCOUNTER — Other Ambulatory Visit: Payer: Self-pay

## 2021-07-02 ENCOUNTER — Other Ambulatory Visit (INDEPENDENT_AMBULATORY_CARE_PROVIDER_SITE_OTHER): Payer: Medicare HMO

## 2021-07-02 DIAGNOSIS — E1165 Type 2 diabetes mellitus with hyperglycemia: Secondary | ICD-10-CM | POA: Diagnosis not present

## 2021-07-02 DIAGNOSIS — Z794 Long term (current) use of insulin: Secondary | ICD-10-CM | POA: Diagnosis not present

## 2021-07-02 DIAGNOSIS — E1129 Type 2 diabetes mellitus with other diabetic kidney complication: Secondary | ICD-10-CM

## 2021-07-02 LAB — BASIC METABOLIC PANEL
BUN: 32 mg/dL — ABNORMAL HIGH (ref 6–23)
CO2: 27 mEq/L (ref 19–32)
Calcium: 9.3 mg/dL (ref 8.4–10.5)
Chloride: 106 mEq/L (ref 96–112)
Creatinine, Ser: 1.48 mg/dL (ref 0.40–1.50)
GFR: 46.25 mL/min — ABNORMAL LOW (ref >60.00)
Glucose, Bld: 102 mg/dL — ABNORMAL HIGH (ref 70–99)
Potassium: 5.2 mEq/L — ABNORMAL HIGH (ref 3.5–5.1)
Sodium: 140 mEq/L (ref 135–145)

## 2021-07-02 LAB — HEMOGLOBIN A1C: Hgb A1c MFr Bld: 7.4 % — ABNORMAL HIGH (ref 4.6–6.5)

## 2021-07-05 ENCOUNTER — Ambulatory Visit (INDEPENDENT_AMBULATORY_CARE_PROVIDER_SITE_OTHER): Payer: Medicare HMO | Admitting: Endocrinology

## 2021-07-05 ENCOUNTER — Other Ambulatory Visit: Payer: Self-pay

## 2021-07-05 ENCOUNTER — Encounter: Payer: Self-pay | Admitting: Endocrinology

## 2021-07-05 VITALS — BP 106/64 | HR 60 | Ht 69.5 in | Wt 181.0 lb

## 2021-07-05 DIAGNOSIS — Z794 Long term (current) use of insulin: Secondary | ICD-10-CM

## 2021-07-05 DIAGNOSIS — E1165 Type 2 diabetes mellitus with hyperglycemia: Secondary | ICD-10-CM

## 2021-07-05 DIAGNOSIS — I952 Hypotension due to drugs: Secondary | ICD-10-CM

## 2021-07-05 DIAGNOSIS — E875 Hyperkalemia: Secondary | ICD-10-CM

## 2021-07-05 MED ORDER — INSULIN PEN NEEDLE 32G X 4 MM MISC: 1 refills | Status: DC

## 2021-07-05 MED ORDER — OLMESARTAN MEDOXOMIL 20 MG PO TABS: 20.0000 mg | ORAL_TABLET | Freq: Every day | ORAL | 2 refills | Status: DC

## 2021-07-05 NOTE — Patient Instructions (Addendum)
New dose of Olmesartan  Check BP daily  Take syringe with needle with you for lunch  Have banana 3/7 days  Check blood sugars on waking up 2-3 days a week  Also check blood sugars about 2 hours after meals and do this after different meals by rotation  Recommended blood sugar levels on waking up are 90-130 and about 2 hours after meal is 130-180  Please bring your blood sugar monitor to each visit, thank you

## 2021-07-05 NOTE — Progress Notes (Signed)
Patient ID: Joel Webb, male   DOB: [birth year], 75 y.o.   MRN: [id number]           Reason for Appointment: Follow-up for Type 2 Diabetes and related problems    Referring physician: Dr. Moreen Fowler  History of Present Illness:          Date of diagnosis of type 2 diabetes mellitus: 2007       Background history:   He is unclear when he was diagnosed to have diabetes but was not very symptomatic He had been on metformin alone for several years with reportedly good control Also had previously been very consistent with exercise and diet His blood sugars probably started going up 3-4 years ago and he was given Lantus insulin in addition He was seen in consultation in 4/17 with an A1c of 8.4 and he was started Trulicity in addition to his Lantus and metformin This was stopped in 7/17 because of high out-of-pocket expense  The V-go pump was stopped because he found it too uncomfortable and not sticking well  Recent history:   INSULIN regimen is: Tresiba 40 units in p.m., Novolin R 6 at breakfast and 8  lunchtime; 10 units at supper  Non-insulin hypoglycemic drugs the patient is taking are: metformin 1 g twice a day, Trulicity 1.5 mg weekly   Current management, blood sugar patterns and problems identified:   His A1c is gradually increasing and now 7.4 compared to 7.1  He has checked his blood sugar only 3 times in the last month and he was not aware of this  Previously was referred to the nurse educator and his wife is also educated about his management and insulin  Not clear why his A1c is higher but may be partly related to his not taking any insulin coverage for his meal at work which is usually a sandwich or other carbohydrate  Unable to verify whether his blood sugars are higher after meals because of lack of monitoring  His weight has been about the same recently  Hypoglycemia symptoms reported He is  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  Mean/median:       Self-care:  Usually tries to limit High-fat foods and portions                 Dietician visit, most recent:6/17                Weight history:  Wt Readings from Last 3 Encounters:  07/05/21 181 lb (82.1 kg)  03/29/21 182 lb 3.2 oz (82.6 kg)  11/30/20 187 lb 9.6 oz (85.1 kg)    Glycemic control:   Lab Results  Component Value Date   HGBA1C 7.4 (H) 07/02/2021   HGBA1C 7.1 (H) 03/26/2021   HGBA1C 6.7 (H) 11/07/2020   Lab Results  Component Value Date   MICROALBUR 30.0 (H) 11/28/2020   [place]  97 02/02/2016   CREATININE 1.48 07/02/2021   Lab Results  Component Value Date   MICRALBCREAT 33.3 (H) 11/28/2020    Lab Results  Component Value Date   FRUCTOSAMINE 248 08/08/2020   FRUCTOSAMINE 266 06/02/2017   FRUCTOSAMINE 233 02/04/2017     Other problems addressed today: See review of systems   Lab on 07/02/2021  Component Date Value Ref Range Status   Sodium 07/02/2021 140  135 - 145 mEq/L Final   Potassium 07/02/2021 5.2 No hemolysis seen (A) 3.5 - 5.1 mEq/L Final   Chloride 07/02/2021 106  96 - 112 mEq/L Final   CO2 07/02/2021 27  19 - 32 mEq/L Final   Glucose, Bld 07/02/2021 102 (A) 70 - 99 mg/dL Final   BUN 07/02/2021 32 (A) 6 - 23 mg/dL Final   Creatinine, Ser 07/02/2021 1.48  0.40 - 1.50 mg/dL Final   GFR 07/02/2021 46.25 (A) >60.00 mL/min Final   Calculated using the CKD-EPI Creatinine Equation (2021)   Calcium 07/02/2021 9.3  8.4 - 10.5 mg/dL Final   Hgb A1c MFr Bld 07/02/2021 7.4  (A) 4.6 - 6.5 % Final   Glycemic Control Guidelines for People with Diabetes:Non Diabetic:  <6%Goal of Therapy: <7%Additional Action Suggested:  >8%       Allergies as of 07/05/2021   No Known Allergies      Medication List        Accurate as of July 05, 2021  9:46 AM. If you have any questions, ask your nurse or doctor.          Accu-Chek Guide test strip Generic drug: glucose blood Use as instructed to check blood sugar once a day dx code E11.65   Accu-Chek Guide w/Device Kit Use to check blood sugar once a day   amLODipine 5 MG tablet Commonly known as: NORVASC   atorvastatin 80 MG tablet Commonly known as: LIPITOR Take 80 mg by mouth daily.   BD Insulin Syringe U/F 31G X 5/16" 1 ML Misc Generic drug: Insulin Syringe-Needle U-100 USE TO GIVE 2 INSULIN INJECTIONS DAILY   DULoxetine 60 MG capsule Commonly known as: CYMBALTA 60 mg daily.   finasteride 5 MG tablet Commonly known as: PROSCAR   Fish Oil 1000 MG Caps Take by mouth. 2x/day   FreeStyle Libre 2 Reader Kerrin Mo Use as instructed to check blood sugar 4 times daily   FreeStyle Libre 2 Sensor Misc Use as instructed to check blood sugar change every 14 days   Insulin Pen Needle 32G X 4 MM Misc Use on insulin pen What changed:  medication strength additional instructions Changed by: Elayne Snare, MD   metFORMIN 1000 MG tablet Commonly known as: GLUCOPHAGE Take 1 tablet (1,000 mg total) by mouth 2 (two) times daily with a meal.   multivitamin with minerals Tabs tablet Take 1 tablet by mouth daily.   NovoLIN R 100 units/mL injection Generic drug: insulin regular INJECT 0.08 MLS(8 UNITS TOTAL) INTO THE SKIN TWICE DAILY BEFORE A MEAL; ALSO INJECT 10 UNITS UNDER THE SKIN ONCE DAILY AT SUPPER   NovoLOG FlexPen 100 UNIT/ML FlexPen Generic drug: insulin aspart Inject 8 Units into the skin 2 (two) times daily with a meal.   olmesartan-hydrochlorothiazide 40-12.5 MG tablet Commonly known as: BENICAR  HCT   omeprazole 20 MG capsule Commonly known as: PRILOSEC Take 20 mg by mouth every other day. PRN   oxybutynin 5 MG 24 hr tablet Commonly known as: DITROPAN-XL 06/12/20 not started   tamsulosin 0.4 MG Caps  capsule Commonly known as: FLOMAX Take 0.4 mg by mouth. Reported on 05/29/2016   Tyler Aas FlexTouch 200 UNIT/ML FlexTouch Pen Generic drug: insulin degludec INJECT 40 UNITS UNDER THE SKIN DAILY   Trulicity 1.5 WJ/1.9JY Sopn Generic drug: Dulaglutide Inject once weekly   UNABLE TO FIND Med Name: Prevagen        Allergies: No Known Allergies  Past Medical History:  Diagnosis Date   CKD (chronic kidney disease), stage III ([place])    Depression    Diabetes mellitus without complication (HCC)    ED (erectile dysfunction)    GERD (gastroesophageal reflux disease)    Hyperlipidemia    Hypertension    Plantar fasciitis    Prostatitis     Past Surgical History:  Procedure Laterality Date   CIRCUMCISION     COLONOSCOPY     MOUTH SURGERY  01/2020    Family History  Problem Relation Age of Onset   Hepatitis C Mother    Mesothelioma Father    Diabetes Father    Heart disease Father     Social History:  reports that he quit smoking about 12 years ago. His smoking use included cigarettes. He has never used smokeless tobacco. He reports current alcohol use. He reports that he does not use drugs.    Review of Systems     RENAL dysfunction: Creatinine is somewhat variable   Lab Results  Component Value Date   CREATININE 1.48 07/02/2021   CREATININE 1.62 (H) 03/26/2021   CREATININE 1.40 11/28/2020    Lipid history: Has been on Lipitor 80 mg for cholesterol control from his PCP  Labs show LDL 96 followed by PCP, still has high triglycerides     Lab Results  Component Value Date   CHOL 129 12/24/2016   HDL 23.70 (L) 12/24/2016   LDLCALC 97 02/02/2016   LDLDIRECT 84.0 04/07/2020   TRIG 210.0 (H) 12/24/2016   CHOLHDL 5 12/24/2016            Hypertension:Long-standing, treated with Benicar HCT 40/12.5, amlodipine 5 mg daily, followed by PCP   Does monitor periodically at home but not recently  He says he sometimes feels lightheaded but he felt it was from the heat  With Benicar instead of losartan his urine microalbumin is only 33 compared to about 70   BP Readings from Last 3 Encounters:  07/05/21 106/64  03/29/21 128/84  11/30/20 132/78   Hyperkalemia:  This is new, he also is eating a banana every day and he thinks he is trying to prevent cramps with extra potassium  Lab Results  Component Value Date   K 5.2 No hemolysis seen (H) 07/02/2021    Review of Systems   Most recent eye exam was in 09/2020, no retinopathy  Most recent foot exam: 05/2020    Physical Examination:  BP 106/64   Pulse 60   Ht 5' 9.5" (1.765 m)   Wt 181 lb (82.1 kg)   SpO2 98%   BMI 26.35 kg/m   No ankle edema present  ASSESSMENT:  Diabetes type 2, on insulin  See history of present illness for  discussion of current diabetes management, blood sugar patterns and problems identified  His A1c is relatively higher at 7.4  He is not monitoring much at home Also not taking insulin at lunch when eating out at his work and not aware that he can take his insulin with him in the lungs:  May have had hypoglycemia once with taking his insulin late  Also is somewhat more active on the days he is working in the afternoons  Hypertension with microalbuminuria: Blood pressure is unusually low  Microalbumin had improved with switching to Benicar  Renal dysfunction: Creatinine is relatively better but his potassium is higher now  History of high triglycerides: Followed by PCP   PLAN:    He will try to keep his insulin with him for lunch coverage and he can draw it up at home He can take this right before his lunch if he does not have time to take it to have time No change in Tresiba Start checking blood sugars twice a day on a  regular basis especially after meals to help adjust his regular insulin 4 mm pen needles have been sent  Start checking blood pressure regularly at home, to call if it is starting to go higher or continues to stay low Avoid high potassium foods on a regular basis Stop Benicar HCTZ and only use Benicar 20 mg daily Follow-up in 4 weeks for blood pressure  Patient Instructions  New dose of Olmesartan  Check BP daily  Take syringe with needle with you for lunch  Have banana 3/7 days  Check blood sugars on waking up 2-3 days a week  Also check blood sugars about 2 hours after meals and do this after different meals by rotation  Recommended blood sugar levels on waking up are 90-130 and about 2 hours after meal is 130-180  Please bring your blood sugar monitor to each visit, thank you   Elayne Snare 07/05/2021, 9:46 AM   Note: This office note was prepared with Dragon voice recognition system technology. Any transcriptional errors that result from this process are unintentional.

## 2021-07-17 DIAGNOSIS — N403 Nodular prostate with lower urinary tract symptoms: Secondary | ICD-10-CM | POA: Diagnosis not present

## 2021-07-17 DIAGNOSIS — R3912 Poor urinary stream: Secondary | ICD-10-CM | POA: Diagnosis not present

## 2021-08-07 ENCOUNTER — Ambulatory Visit (INDEPENDENT_AMBULATORY_CARE_PROVIDER_SITE_OTHER): Payer: Medicare HMO | Admitting: Endocrinology

## 2021-08-07 ENCOUNTER — Encounter: Payer: Self-pay | Admitting: Endocrinology

## 2021-08-07 ENCOUNTER — Other Ambulatory Visit: Payer: Self-pay

## 2021-08-07 VITALS — BP 145/85 | HR 62 | Wt 185.6 lb

## 2021-08-07 DIAGNOSIS — E1165 Type 2 diabetes mellitus with hyperglycemia: Secondary | ICD-10-CM

## 2021-08-07 DIAGNOSIS — I10 Essential (primary) hypertension: Secondary | ICD-10-CM | POA: Diagnosis not present

## 2021-08-07 DIAGNOSIS — E875 Hyperkalemia: Secondary | ICD-10-CM | POA: Diagnosis not present

## 2021-08-07 DIAGNOSIS — Z794 Long term (current) use of insulin: Secondary | ICD-10-CM | POA: Diagnosis not present

## 2021-08-07 LAB — BASIC METABOLIC PANEL
BUN: 23 mg/dL (ref 6–23)
CO2: 26 mEq/L (ref 19–32)
Calcium: 8.9 mg/dL (ref 8.4–10.5)
Chloride: 107 mEq/L (ref 96–112)
Creatinine, Ser: 1.32 mg/dL (ref 0.40–1.50)
GFR: 53.02 mL/min — ABNORMAL LOW (ref >60.00)
Glucose, Bld: 184 mg/dL — ABNORMAL HIGH (ref 70–99)
Potassium: 4.5 mEq/L (ref 3.5–5.1)
Sodium: 140 mEq/L (ref 135–145)

## 2021-08-07 NOTE — Patient Instructions (Addendum)
Get [employer] BP meter  Check blood sugars on waking up 3  days a week  Also check blood sugars about 2 hours after meals and do this after different meals by rotation  Recommended blood sugar levels on waking up are 90-130 and about 2 hours after meal is 130-160  Please bring your blood sugar monitor to each visit, thank you  Will likely start HCTZ for high BP

## 2021-08-07 NOTE — Progress Notes (Signed)
Patient ID: Joel Webb, male   DOB: 08-18-1946, 75 y.o.   MRN: [id number]           Reason for Appointment: Follow-up for Type 2 Diabetes and related problems    Referring physician: Dr. Azucena Cecil  History of Present Illness:          Date of diagnosis of type 2 diabetes mellitus: 2007       Background history:   He is unclear when he was diagnosed to have diabetes but was not very symptomatic He had been on metformin alone for several years with reportedly good control Also had previously been very consistent with exercise and diet His blood sugars probably started going up 3-4 years ago and he was given Lantus insulin in addition He was seen in consultation in 4/17 with an A1c of 8.4 and he was started Trulicity in addition to his Lantus and metformin This was stopped in 7/17 because of high out-of-pocket expense  The V-go pump was stopped because he found it too uncomfortable and not sticking well  Recent history:   INSULIN regimen is: [place] 40 units in p.m., Novolin R 6 at breakfast and 8  lunchtime; 12 units at supper  Non-insulin hypoglycemic drugs the patient is taking are: metformin 1 g twice a day, Trulicity 1.5 mg weekly   Current management, blood sugar patterns and problems identified:   His A1c is last 7.4 compared to 7.1  He has checked his blood sugar only rarely again  He was told to take his regular insulin with him when going to work to cover his lunch and he thinks he is doing this most of the time Currently has only 5 readings in the last month at different times and blood sugars are looking fairly good Appears to have gained a little weight He takes his [place] regularly in the evenings He thinks he may feel weak or lightheaded if his blood sugars are below 100 Does not think he has had any hypoglycemia  Glucose monitoring:  done less than 1 times a day         Glucometer:  Accu-Chek  Blood Glucose readings from download:  RANGE 99-161 with  median 118  Previously:   PRE-MEAL Fasting Lunch Dinner Bedtime Overall  Glucose range: 84  110, 152    Mean/median:        POST-MEAL PC Breakfast PC Lunch PC Dinner  Glucose range:   ?  Mean/median:       Self-care:  Usually tries to limit High-fat foods and portions                 Dietician visit, most recent:6/17                Weight history:  Wt Readings from Last 3 Encounters:  08/07/21 185 lb 9.6 oz (84.2 kg)  07/05/21 181 lb (82.1 kg)  03/29/21 182 lb 3.2 oz (82.6 kg)    Glycemic control:   Lab Results  Component Value Date   HGBA1C 7.4 (H) 07/02/2021   HGBA1C 7.1 (H) 03/26/2021   HGBA1C 6.7 (H) 11/07/2020   Lab Results  Component Value Date   MICROALBUR 30.0 (H) 11/28/2020   LDLCALC 97 02/02/2016   CREATININE 1.48 07/02/2021   Lab Results  Component Value Date   MICRALBCREAT 33.3 (H) 11/28/2020    Lab Results  Component Value Date   FRUCTOSAMINE 248 08/08/2020   FRUCTOSAMINE 266 06/02/2017   FRUCTOSAMINE 233 02/04/2017  Other problems addressed today: See review of systems   No visits with results within 1 Week(s) from this visit.  Latest known visit with results is:  Lab on 07/02/2021  Component Date Value Ref Range Status   Sodium 07/02/2021 140  135 - 145 mEq/L Final   Potassium 07/02/2021 5.2 No hemolysis seen (A) 3.5 - 5.1 mEq/L Final   Chloride 07/02/2021 106  96 - 112 mEq/L Final   CO2 07/02/2021 27  19 - 32 mEq/L Final   Glucose, Bld 07/02/2021 102 (A) 70 - 99 mg/dL Final   BUN 07/02/2021 32 (A) 6 - 23 mg/dL Final   Creatinine, Ser 07/02/2021 1.48  0.40 - 1.50 mg/dL Final   GFR 07/02/2021 46.25 (A) >60.00 mL/min Final   Calculated using the CKD-EPI Creatinine Equation (2021)   Calcium 07/02/2021 9.3  8.4 - 10.5 mg/dL Final   Hgb A1c MFr Bld 07/02/2021 7.4 (A) 4.6 - 6.5 % Final   Glycemic Control Guidelines for People with Diabetes:Non Diabetic:  <6%Goal of Therapy: <7%Additional Action Suggested:  >8%       Allergies  as of 08/07/2021   No Known Allergies      Medication List        Accurate as of August 07, 2021  9:46 AM. If you have any questions, ask your nurse or doctor.          Accu-Chek Guide test strip Generic drug: glucose blood Use as instructed to check blood sugar once a day dx code E11.65   Accu-Chek Guide w/Device Kit Use to check blood sugar once a day   amLODipine 5 MG tablet Commonly known as: NORVASC   atorvastatin 80 MG tablet Commonly known as: LIPITOR Take 80 mg by mouth daily.   BD Insulin Syringe U/F 31G X 5/16" 1 ML Misc Generic drug: Insulin Syringe-Needle U-100 USE TO GIVE 2 INSULIN INJECTIONS DAILY   DULoxetine 60 MG capsule Commonly known as: CYMBALTA 60 mg daily.   finasteride 5 MG tablet Commonly known as: PROSCAR   Fish Oil 1000 MG Caps Take by mouth. 2x/day   FreeStyle Libre 2 Reader [facility] Use as instructed to check blood sugar 4 times daily   Insulin Pen Needle 32G X 4 MM Misc Use on insulin pen   metFORMIN 1000 MG tablet Commonly known as: GLUCOPHAGE Take 1 tablet (1,000 mg total) by mouth 2 (two) times daily with a meal.   multivitamin with minerals Tabs tablet Take 1 tablet by mouth daily.   NovoLIN R 100 units/mL injection Generic drug: insulin regular INJECT 0.08 MLS(8 UNITS TOTAL) INTO THE SKIN TWICE DAILY BEFORE A MEAL; ALSO INJECT 10 UNITS UNDER THE SKIN ONCE DAILY AT SUPPER   olmesartan 20 MG tablet Commonly known as: BENICAR Take 1 tablet (20 mg total) by mouth daily.   omeprazole 20 MG capsule Commonly known as: PRILOSEC Take 20 mg by mouth every other day. PRN   oxybutynin 5 MG 24 hr tablet Commonly known as: DITROPAN-XL 06/12/20 not started   tamsulosin 0.4 MG Caps capsule Commonly known as: FLOMAX Take 0.4 mg by mouth. Reported on 05/29/2016   Tyler Aas FlexTouch 200 UNIT/ML FlexTouch Pen Generic drug: insulin degludec INJECT 40 UNITS UNDER THE SKIN DAILY   Trulicity 1.5 OQ/9.4TM Sopn Generic drug:  Dulaglutide Inject once weekly   UNABLE TO FIND Med Name: Prevagen        Allergies: No Known Allergies  Past Medical History:  Diagnosis Date   CKD (chronic kidney disease), stage  III ([place])    Depression    Diabetes mellitus without complication ([place])    ED (erectile dysfunction)    GERD (gastroesophageal reflux disease)    Hyperlipidemia    Hypertension    Plantar fasciitis    Prostatitis     Past Surgical History:  Procedure Laterality Date   CIRCUMCISION     COLONOSCOPY     MOUTH SURGERY  01/2020    Family History  Problem Relation Age of Onset   Hepatitis C Mother    Mesothelioma Father    Diabetes Father    Heart disease Father     Social History:  reports that he quit smoking about 12 years ago. His smoking use included cigarettes. He has never used smokeless tobacco. He reports current alcohol use. He reports that he does not use drugs.    Review of Systems     RENAL dysfunction: Creatinine is as follows  Lab Results  Component Value Date   CREATININE 1.48 07/02/2021   CREATININE 1.62 (H) 03/26/2021   CREATININE 1.40 11/28/2020    Lipid history: Has been on Lipitor 80 mg for cholesterol control from his PCP  Labs show LDL 96 followed by PCP, still has high triglycerides     Lab Results  Component Value Date   CHOL 129 12/24/2016   HDL 23.70 (L) 12/24/2016   LDLCALC 97 02/02/2016   LDLDIRECT 84.0 04/07/2020   TRIG 210.0 (H) 12/24/2016   CHOLHDL 5 12/24/2016           Hypertension:Long-standing, currently treated with Benicar 20, amlodipine 5 mg daily, followed by PCP Blood pressure was relatively low on the last visit with Benicar HCTZ 40/12.5   Recently has not had a blood pressure monitor at home and has not checked it  With Benicar instead of losartan his urine microalbumin is only 33 compared to about 70   BP Readings from Last 3 Encounters:  08/07/21 138/84  07/05/21 106/64  03/29/21 128/84   Hyperkalemia:  This is new,  he also is eating a banana every day and he thinks he is trying to prevent cramps with extra potassium  Lab Results  Component Value Date   K 5.2 No hemolysis seen (H) 07/02/2021    Review of Systems   Most recent eye exam was in 09/2020, no retinopathy  Most recent foot exam: 05/2020    Physical Examination:  BP 138/84   Pulse 62   Wt 185 lb 9.6 oz (84.2 kg)   SpO2 99%   BMI 27.02 kg/m   Repeat blood pressure still high  ASSESSMENT:  Diabetes type 2, on insulin  See history of present illness for up-to-date information  His A1c is relatively higher at 7.4 last month  With taking his insulin with him at work for covering his lunch his blood sugars are better in the afternoon Highest blood sugar 161 after dinner but has only 5 readings overall in the last month   Hypertension with microalbuminuria: Blood pressure is now relatively high  Renal dysfunction: Creatinine needs to be rechecked along with potassium   PLAN:    He will likely need to go back to HCTZ for better blood pressure control but will first check his potassium and renal function Continue potassium restriction with avoiding bananas on a regular basis More blood sugar monitoring at different times Currently no need of changing insulin Follow-up in 2 months  There are no Patient Instructions on file for this visit.   Elayne Snare  08/07/2021, 9:46 AM   Note: This office note was prepared with Estate agent. Any transcriptional errors that result from this process are unintentional.

## 2021-08-17 ENCOUNTER — Other Ambulatory Visit: Payer: Self-pay | Admitting: Endocrinology

## 2021-08-17 DIAGNOSIS — Z794 Long term (current) use of insulin: Secondary | ICD-10-CM

## 2021-08-20 DIAGNOSIS — N1831 Chronic kidney disease, stage 3a: Secondary | ICD-10-CM | POA: Diagnosis not present

## 2021-08-20 DIAGNOSIS — E119 Type 2 diabetes mellitus without complications: Secondary | ICD-10-CM | POA: Diagnosis not present

## 2021-08-20 DIAGNOSIS — N400 Enlarged prostate without lower urinary tract symptoms: Secondary | ICD-10-CM | POA: Diagnosis not present

## 2021-08-20 DIAGNOSIS — K219 Gastro-esophageal reflux disease without esophagitis: Secondary | ICD-10-CM | POA: Diagnosis not present

## 2021-08-20 DIAGNOSIS — E1122 Type 2 diabetes mellitus with diabetic chronic kidney disease: Secondary | ICD-10-CM | POA: Diagnosis not present

## 2021-08-20 DIAGNOSIS — F329 Major depressive disorder, single episode, unspecified: Secondary | ICD-10-CM | POA: Diagnosis not present

## 2021-08-20 DIAGNOSIS — I10 Essential (primary) hypertension: Secondary | ICD-10-CM | POA: Diagnosis not present

## 2021-08-20 DIAGNOSIS — E782 Mixed hyperlipidemia: Secondary | ICD-10-CM | POA: Diagnosis not present

## 2021-09-03 DIAGNOSIS — S51811A Laceration without foreign body of right forearm, initial encounter: Secondary | ICD-10-CM | POA: Diagnosis not present

## 2021-09-03 DIAGNOSIS — S41112A Laceration without foreign body of left upper arm, initial encounter: Secondary | ICD-10-CM | POA: Diagnosis not present

## 2021-09-03 DIAGNOSIS — Z23 Encounter for immunization: Secondary | ICD-10-CM | POA: Diagnosis not present

## 2021-09-05 DIAGNOSIS — S81812A Laceration without foreign body, left lower leg, initial encounter: Secondary | ICD-10-CM | POA: Diagnosis not present

## 2021-09-05 DIAGNOSIS — S41112D Laceration without foreign body of left upper arm, subsequent encounter: Secondary | ICD-10-CM | POA: Diagnosis not present

## 2021-09-05 DIAGNOSIS — S51811D Laceration without foreign body of right forearm, subsequent encounter: Secondary | ICD-10-CM | POA: Diagnosis not present

## 2021-09-08 DIAGNOSIS — I129 Hypertensive chronic kidney disease with stage 1 through stage 4 chronic kidney disease, or unspecified chronic kidney disease: Secondary | ICD-10-CM | POA: Diagnosis not present

## 2021-09-08 DIAGNOSIS — F419 Anxiety disorder, unspecified: Secondary | ICD-10-CM | POA: Diagnosis not present

## 2021-09-08 DIAGNOSIS — S41112D Laceration without foreign body of left upper arm, subsequent encounter: Secondary | ICD-10-CM | POA: Diagnosis not present

## 2021-09-08 DIAGNOSIS — N1831 Chronic kidney disease, stage 3a: Secondary | ICD-10-CM | POA: Diagnosis not present

## 2021-09-08 DIAGNOSIS — S51811D Laceration without foreign body of right forearm, subsequent encounter: Secondary | ICD-10-CM | POA: Diagnosis not present

## 2021-09-08 DIAGNOSIS — F339 Major depressive disorder, recurrent, unspecified: Secondary | ICD-10-CM | POA: Diagnosis not present

## 2021-09-08 DIAGNOSIS — N400 Enlarged prostate without lower urinary tract symptoms: Secondary | ICD-10-CM | POA: Diagnosis not present

## 2021-09-08 DIAGNOSIS — G319 Degenerative disease of nervous system, unspecified: Secondary | ICD-10-CM | POA: Diagnosis not present

## 2021-09-08 DIAGNOSIS — E1122 Type 2 diabetes mellitus with diabetic chronic kidney disease: Secondary | ICD-10-CM | POA: Diagnosis not present

## 2021-09-10 DIAGNOSIS — S80812D Abrasion, left lower leg, subsequent encounter: Secondary | ICD-10-CM | POA: Diagnosis not present

## 2021-09-10 DIAGNOSIS — S41112D Laceration without foreign body of left upper arm, subsequent encounter: Secondary | ICD-10-CM | POA: Diagnosis not present

## 2021-09-10 DIAGNOSIS — S70312D Abrasion, left thigh, subsequent encounter: Secondary | ICD-10-CM | POA: Diagnosis not present

## 2021-09-11 DIAGNOSIS — N400 Enlarged prostate without lower urinary tract symptoms: Secondary | ICD-10-CM | POA: Diagnosis not present

## 2021-09-11 DIAGNOSIS — S41112D Laceration without foreign body of left upper arm, subsequent encounter: Secondary | ICD-10-CM | POA: Diagnosis not present

## 2021-09-11 DIAGNOSIS — F339 Major depressive disorder, recurrent, unspecified: Secondary | ICD-10-CM | POA: Diagnosis not present

## 2021-09-11 DIAGNOSIS — N1831 Chronic kidney disease, stage 3a: Secondary | ICD-10-CM | POA: Diagnosis not present

## 2021-09-11 DIAGNOSIS — I129 Hypertensive chronic kidney disease with stage 1 through stage 4 chronic kidney disease, or unspecified chronic kidney disease: Secondary | ICD-10-CM | POA: Diagnosis not present

## 2021-09-11 DIAGNOSIS — E1122 Type 2 diabetes mellitus with diabetic chronic kidney disease: Secondary | ICD-10-CM | POA: Diagnosis not present

## 2021-09-11 DIAGNOSIS — F419 Anxiety disorder, unspecified: Secondary | ICD-10-CM | POA: Diagnosis not present

## 2021-09-11 DIAGNOSIS — G319 Degenerative disease of nervous system, unspecified: Secondary | ICD-10-CM | POA: Diagnosis not present

## 2021-09-11 DIAGNOSIS — S51811D Laceration without foreign body of right forearm, subsequent encounter: Secondary | ICD-10-CM | POA: Diagnosis not present

## 2021-09-13 DIAGNOSIS — F419 Anxiety disorder, unspecified: Secondary | ICD-10-CM | POA: Diagnosis not present

## 2021-09-13 DIAGNOSIS — E1122 Type 2 diabetes mellitus with diabetic chronic kidney disease: Secondary | ICD-10-CM | POA: Diagnosis not present

## 2021-09-13 DIAGNOSIS — G319 Degenerative disease of nervous system, unspecified: Secondary | ICD-10-CM | POA: Diagnosis not present

## 2021-09-13 DIAGNOSIS — N1831 Chronic kidney disease, stage 3a: Secondary | ICD-10-CM | POA: Diagnosis not present

## 2021-09-13 DIAGNOSIS — N400 Enlarged prostate without lower urinary tract symptoms: Secondary | ICD-10-CM | POA: Diagnosis not present

## 2021-09-13 DIAGNOSIS — S51811D Laceration without foreign body of right forearm, subsequent encounter: Secondary | ICD-10-CM | POA: Diagnosis not present

## 2021-09-13 DIAGNOSIS — F339 Major depressive disorder, recurrent, unspecified: Secondary | ICD-10-CM | POA: Diagnosis not present

## 2021-09-13 DIAGNOSIS — S41112D Laceration without foreign body of left upper arm, subsequent encounter: Secondary | ICD-10-CM | POA: Diagnosis not present

## 2021-09-13 DIAGNOSIS — I129 Hypertensive chronic kidney disease with stage 1 through stage 4 chronic kidney disease, or unspecified chronic kidney disease: Secondary | ICD-10-CM | POA: Diagnosis not present

## 2021-09-14 DIAGNOSIS — G319 Degenerative disease of nervous system, unspecified: Secondary | ICD-10-CM | POA: Diagnosis not present

## 2021-09-14 DIAGNOSIS — E1122 Type 2 diabetes mellitus with diabetic chronic kidney disease: Secondary | ICD-10-CM | POA: Diagnosis not present

## 2021-09-14 DIAGNOSIS — F419 Anxiety disorder, unspecified: Secondary | ICD-10-CM | POA: Diagnosis not present

## 2021-09-14 DIAGNOSIS — F339 Major depressive disorder, recurrent, unspecified: Secondary | ICD-10-CM | POA: Diagnosis not present

## 2021-09-14 DIAGNOSIS — N400 Enlarged prostate without lower urinary tract symptoms: Secondary | ICD-10-CM | POA: Diagnosis not present

## 2021-09-14 DIAGNOSIS — S41112D Laceration without foreign body of left upper arm, subsequent encounter: Secondary | ICD-10-CM | POA: Diagnosis not present

## 2021-09-14 DIAGNOSIS — I129 Hypertensive chronic kidney disease with stage 1 through stage 4 chronic kidney disease, or unspecified chronic kidney disease: Secondary | ICD-10-CM | POA: Diagnosis not present

## 2021-09-14 DIAGNOSIS — S51811D Laceration without foreign body of right forearm, subsequent encounter: Secondary | ICD-10-CM | POA: Diagnosis not present

## 2021-09-14 DIAGNOSIS — N1831 Chronic kidney disease, stage 3a: Secondary | ICD-10-CM | POA: Diagnosis not present

## 2021-09-18 DIAGNOSIS — S51811D Laceration without foreign body of right forearm, subsequent encounter: Secondary | ICD-10-CM | POA: Diagnosis not present

## 2021-09-18 DIAGNOSIS — I129 Hypertensive chronic kidney disease with stage 1 through stage 4 chronic kidney disease, or unspecified chronic kidney disease: Secondary | ICD-10-CM | POA: Diagnosis not present

## 2021-09-18 DIAGNOSIS — G319 Degenerative disease of nervous system, unspecified: Secondary | ICD-10-CM | POA: Diagnosis not present

## 2021-09-18 DIAGNOSIS — S41112D Laceration without foreign body of left upper arm, subsequent encounter: Secondary | ICD-10-CM | POA: Diagnosis not present

## 2021-09-18 DIAGNOSIS — E1122 Type 2 diabetes mellitus with diabetic chronic kidney disease: Secondary | ICD-10-CM | POA: Diagnosis not present

## 2021-09-18 DIAGNOSIS — N1831 Chronic kidney disease, stage 3a: Secondary | ICD-10-CM | POA: Diagnosis not present

## 2021-09-18 DIAGNOSIS — F419 Anxiety disorder, unspecified: Secondary | ICD-10-CM | POA: Diagnosis not present

## 2021-09-18 DIAGNOSIS — F339 Major depressive disorder, recurrent, unspecified: Secondary | ICD-10-CM | POA: Diagnosis not present

## 2021-09-18 DIAGNOSIS — N400 Enlarged prostate without lower urinary tract symptoms: Secondary | ICD-10-CM | POA: Diagnosis not present

## 2021-09-19 DIAGNOSIS — S41112D Laceration without foreign body of left upper arm, subsequent encounter: Secondary | ICD-10-CM | POA: Diagnosis not present

## 2021-09-19 DIAGNOSIS — E1122 Type 2 diabetes mellitus with diabetic chronic kidney disease: Secondary | ICD-10-CM | POA: Diagnosis not present

## 2021-09-19 DIAGNOSIS — F339 Major depressive disorder, recurrent, unspecified: Secondary | ICD-10-CM | POA: Diagnosis not present

## 2021-09-19 DIAGNOSIS — N400 Enlarged prostate without lower urinary tract symptoms: Secondary | ICD-10-CM | POA: Diagnosis not present

## 2021-09-19 DIAGNOSIS — I129 Hypertensive chronic kidney disease with stage 1 through stage 4 chronic kidney disease, or unspecified chronic kidney disease: Secondary | ICD-10-CM | POA: Diagnosis not present

## 2021-09-19 DIAGNOSIS — G319 Degenerative disease of nervous system, unspecified: Secondary | ICD-10-CM | POA: Diagnosis not present

## 2021-09-19 DIAGNOSIS — S51811D Laceration without foreign body of right forearm, subsequent encounter: Secondary | ICD-10-CM | POA: Diagnosis not present

## 2021-09-19 DIAGNOSIS — N1831 Chronic kidney disease, stage 3a: Secondary | ICD-10-CM | POA: Diagnosis not present

## 2021-09-19 DIAGNOSIS — F419 Anxiety disorder, unspecified: Secondary | ICD-10-CM | POA: Diagnosis not present

## 2021-09-25 DIAGNOSIS — N1831 Chronic kidney disease, stage 3a: Secondary | ICD-10-CM | POA: Diagnosis not present

## 2021-09-25 DIAGNOSIS — S41112D Laceration without foreign body of left upper arm, subsequent encounter: Secondary | ICD-10-CM | POA: Diagnosis not present

## 2021-09-25 DIAGNOSIS — I129 Hypertensive chronic kidney disease with stage 1 through stage 4 chronic kidney disease, or unspecified chronic kidney disease: Secondary | ICD-10-CM | POA: Diagnosis not present

## 2021-09-25 DIAGNOSIS — F339 Major depressive disorder, recurrent, unspecified: Secondary | ICD-10-CM | POA: Diagnosis not present

## 2021-09-25 DIAGNOSIS — G319 Degenerative disease of nervous system, unspecified: Secondary | ICD-10-CM | POA: Diagnosis not present

## 2021-09-25 DIAGNOSIS — F419 Anxiety disorder, unspecified: Secondary | ICD-10-CM | POA: Diagnosis not present

## 2021-09-25 DIAGNOSIS — N400 Enlarged prostate without lower urinary tract symptoms: Secondary | ICD-10-CM | POA: Diagnosis not present

## 2021-09-25 DIAGNOSIS — S51811D Laceration without foreign body of right forearm, subsequent encounter: Secondary | ICD-10-CM | POA: Diagnosis not present

## 2021-09-25 DIAGNOSIS — E1122 Type 2 diabetes mellitus with diabetic chronic kidney disease: Secondary | ICD-10-CM | POA: Diagnosis not present

## 2021-09-26 DIAGNOSIS — S41112D Laceration without foreign body of left upper arm, subsequent encounter: Secondary | ICD-10-CM | POA: Diagnosis not present

## 2021-09-26 DIAGNOSIS — F339 Major depressive disorder, recurrent, unspecified: Secondary | ICD-10-CM | POA: Diagnosis not present

## 2021-09-26 DIAGNOSIS — N400 Enlarged prostate without lower urinary tract symptoms: Secondary | ICD-10-CM | POA: Diagnosis not present

## 2021-09-26 DIAGNOSIS — I129 Hypertensive chronic kidney disease with stage 1 through stage 4 chronic kidney disease, or unspecified chronic kidney disease: Secondary | ICD-10-CM | POA: Diagnosis not present

## 2021-09-26 DIAGNOSIS — S51811D Laceration without foreign body of right forearm, subsequent encounter: Secondary | ICD-10-CM | POA: Diagnosis not present

## 2021-09-26 DIAGNOSIS — G319 Degenerative disease of nervous system, unspecified: Secondary | ICD-10-CM | POA: Diagnosis not present

## 2021-09-26 DIAGNOSIS — E1122 Type 2 diabetes mellitus with diabetic chronic kidney disease: Secondary | ICD-10-CM | POA: Diagnosis not present

## 2021-09-26 DIAGNOSIS — F419 Anxiety disorder, unspecified: Secondary | ICD-10-CM | POA: Diagnosis not present

## 2021-09-26 DIAGNOSIS — N1831 Chronic kidney disease, stage 3a: Secondary | ICD-10-CM | POA: Diagnosis not present

## 2021-09-27 DIAGNOSIS — E119 Type 2 diabetes mellitus without complications: Secondary | ICD-10-CM | POA: Diagnosis not present

## 2021-09-27 DIAGNOSIS — H5213 Myopia, bilateral: Secondary | ICD-10-CM | POA: Diagnosis not present

## 2021-09-27 LAB — HM DIABETES EYE EXAM

## 2021-10-01 ENCOUNTER — Other Ambulatory Visit: Payer: Medicare HMO

## 2021-10-02 ENCOUNTER — Other Ambulatory Visit (INDEPENDENT_AMBULATORY_CARE_PROVIDER_SITE_OTHER): Payer: Medicare HMO

## 2021-10-02 ENCOUNTER — Other Ambulatory Visit: Payer: Self-pay

## 2021-10-02 ENCOUNTER — Other Ambulatory Visit: Payer: Self-pay | Admitting: Endocrinology

## 2021-10-02 DIAGNOSIS — E1165 Type 2 diabetes mellitus with hyperglycemia: Secondary | ICD-10-CM

## 2021-10-02 DIAGNOSIS — N1831 Chronic kidney disease, stage 3a: Secondary | ICD-10-CM | POA: Diagnosis not present

## 2021-10-02 DIAGNOSIS — I129 Hypertensive chronic kidney disease with stage 1 through stage 4 chronic kidney disease, or unspecified chronic kidney disease: Secondary | ICD-10-CM | POA: Diagnosis not present

## 2021-10-02 DIAGNOSIS — F419 Anxiety disorder, unspecified: Secondary | ICD-10-CM | POA: Diagnosis not present

## 2021-10-02 DIAGNOSIS — G319 Degenerative disease of nervous system, unspecified: Secondary | ICD-10-CM | POA: Diagnosis not present

## 2021-10-02 DIAGNOSIS — E1122 Type 2 diabetes mellitus with diabetic chronic kidney disease: Secondary | ICD-10-CM | POA: Diagnosis not present

## 2021-10-02 DIAGNOSIS — F339 Major depressive disorder, recurrent, unspecified: Secondary | ICD-10-CM | POA: Diagnosis not present

## 2021-10-02 DIAGNOSIS — Z794 Long term (current) use of insulin: Secondary | ICD-10-CM | POA: Diagnosis not present

## 2021-10-02 DIAGNOSIS — S41112D Laceration without foreign body of left upper arm, subsequent encounter: Secondary | ICD-10-CM | POA: Diagnosis not present

## 2021-10-02 DIAGNOSIS — N400 Enlarged prostate without lower urinary tract symptoms: Secondary | ICD-10-CM | POA: Diagnosis not present

## 2021-10-02 DIAGNOSIS — S51811D Laceration without foreign body of right forearm, subsequent encounter: Secondary | ICD-10-CM | POA: Diagnosis not present

## 2021-10-02 LAB — BASIC METABOLIC PANEL
BUN: 25 mg/dL — ABNORMAL HIGH (ref 6–23)
CO2: 28 mEq/L (ref 19–32)
Calcium: 9.1 mg/dL (ref 8.4–10.5)
Chloride: 105 mEq/L (ref 96–112)
Creatinine, Ser: 1.36 mg/dL (ref 0.40–1.50)
GFR: 51.1 mL/min — ABNORMAL LOW (ref >60.00)
Glucose, Bld: 131 mg/dL — ABNORMAL HIGH (ref 70–99)
Potassium: 4.8 mEq/L (ref 3.5–5.1)
Sodium: 139 mEq/L (ref 135–145)

## 2021-10-02 LAB — HEMOGLOBIN A1C: Hgb A1c MFr Bld: 6.9 % — ABNORMAL HIGH (ref 4.6–6.5)

## 2021-10-03 DIAGNOSIS — S51811D Laceration without foreign body of right forearm, subsequent encounter: Secondary | ICD-10-CM | POA: Diagnosis not present

## 2021-10-03 DIAGNOSIS — F339 Major depressive disorder, recurrent, unspecified: Secondary | ICD-10-CM | POA: Diagnosis not present

## 2021-10-03 DIAGNOSIS — S41112D Laceration without foreign body of left upper arm, subsequent encounter: Secondary | ICD-10-CM | POA: Diagnosis not present

## 2021-10-03 DIAGNOSIS — G319 Degenerative disease of nervous system, unspecified: Secondary | ICD-10-CM | POA: Diagnosis not present

## 2021-10-03 DIAGNOSIS — I129 Hypertensive chronic kidney disease with stage 1 through stage 4 chronic kidney disease, or unspecified chronic kidney disease: Secondary | ICD-10-CM | POA: Diagnosis not present

## 2021-10-03 DIAGNOSIS — N1831 Chronic kidney disease, stage 3a: Secondary | ICD-10-CM | POA: Diagnosis not present

## 2021-10-03 DIAGNOSIS — N400 Enlarged prostate without lower urinary tract symptoms: Secondary | ICD-10-CM | POA: Diagnosis not present

## 2021-10-03 DIAGNOSIS — F419 Anxiety disorder, unspecified: Secondary | ICD-10-CM | POA: Diagnosis not present

## 2021-10-03 DIAGNOSIS — E1122 Type 2 diabetes mellitus with diabetic chronic kidney disease: Secondary | ICD-10-CM | POA: Diagnosis not present

## 2021-10-05 ENCOUNTER — Encounter: Payer: Self-pay | Admitting: Endocrinology

## 2021-10-05 ENCOUNTER — Ambulatory Visit (INDEPENDENT_AMBULATORY_CARE_PROVIDER_SITE_OTHER): Payer: Medicare HMO | Admitting: Endocrinology

## 2021-10-05 ENCOUNTER — Other Ambulatory Visit: Payer: Self-pay

## 2021-10-05 VITALS — BP 160/95 | HR 64 | Ht 69.0 in | Wt 189.2 lb

## 2021-10-05 DIAGNOSIS — I10 Essential (primary) hypertension: Secondary | ICD-10-CM

## 2021-10-05 DIAGNOSIS — E1129 Type 2 diabetes mellitus with other diabetic kidney complication: Secondary | ICD-10-CM

## 2021-10-05 DIAGNOSIS — E1165 Type 2 diabetes mellitus with hyperglycemia: Secondary | ICD-10-CM

## 2021-10-05 DIAGNOSIS — E782 Mixed hyperlipidemia: Secondary | ICD-10-CM | POA: Diagnosis not present

## 2021-10-05 DIAGNOSIS — Z794 Long term (current) use of insulin: Secondary | ICD-10-CM | POA: Diagnosis not present

## 2021-10-05 DIAGNOSIS — R809 Proteinuria, unspecified: Secondary | ICD-10-CM

## 2021-10-05 MED ORDER — HYDROCHLOROTHIAZIDE 12.5 MG PO CAPS: 12.5000 mg | ORAL_CAPSULE | Freq: Every day | ORAL | 1 refills | Status: DC

## 2021-10-05 NOTE — Patient Instructions (Addendum)
Call back re: home sugars  Restart exercise at Y  Check blood sugars on waking up 3 days a week  Also check blood sugars about 2 hours after meals and do this after different meals by rotation  Recommended blood sugar levels on waking up are 90-130 and about 2 hours after meal is 130-160  Please bring your blood sugar monitor to each visit, thank you  Increase Olmesartan to 2 of 20mg  daily Stay on amlodipine  BP target 130/80

## 2021-10-05 NOTE — Progress Notes (Signed)
Patient ID: Joel Webb, male   DOB: [birth year], 75 y.o.   MRN: [id number]           Reason for Appointment: Follow-up for Type 2 Diabetes and related problems    Referring physician: Dr. Moreen Fowler  History of Present Illness:          Date of diagnosis of type 2 diabetes mellitus: 2007       Background history:   He is unclear when he was diagnosed to have diabetes but was not very symptomatic He had been on metformin alone for several years with reportedly good control Also had previously been very consistent with exercise and diet His blood sugars probably started going up 3-4 years ago and he was given Lantus insulin in addition He was seen in consultation in 4/17 with an A1c of 8.4 and he was started Trulicity in addition to his Lantus and metformin This was stopped in 7/17 because of high out-of-pocket expense  The V-go pump was stopped because he found it too uncomfortable and not sticking well  Recent history:   INSULIN regimen is: [place] 40 units in p.m., Novolin R 6 at breakfast and 8  lunchtime; 12 units at supper  Non-insulin hypoglycemic drugs the patient is taking are: metformin 1 g twice a day, Trulicity 1.5 mg weekly   Current management, blood sugar patterns and problems identified:   His A1c is 6.9, was 7.4 compared to 7.1  He has not brought his blood sugar meter for download, previously had been checking his blood sugar only rarely   He thinks his blood sugars have been high after dinner but cannot confirm how often, may be getting some readings in the 190s However he is asking about foods that are raising his blood sugars such as rice and grapes Also unclear whether his fasting readings are in a good range, was 131 in the lab He has gained weight which he thinks is from lack of exercise He has not been working because of recent fall and 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 Has not gone back to the gym No reported hypoglycemic symptoms  either He takes his Trulicity consistently every Thursday  Glucose monitoring:  done less than 1 times a day         Glucometer:  Accu-Chek  Blood Glucose readings not available   Self-care:  Usually tries to limit High-fat foods and portions                 Dietician visit, most recent:6/17                Weight history:  Wt Readings from Last 3 Encounters:  10/05/21 189 lb 3.2 oz (85.8 kg)  08/07/21 185 lb 9.6 oz (84.2 kg)  07/05/21 181 lb (82.1 kg)    Glycemic control:   Lab Results  Component Value Date   HGBA1C 6.9 (H) 10/02/2021   HGBA1C 7.4 (H) 07/02/2021   HGBA1C 7.1 (H) 03/26/2021   Lab Results  Component Value Date   MICROALBUR 30.0 (H) 11/28/2020   LDLCALC 97 02/02/2016   CREATININE 1.36 10/02/2021   Lab Results  Component Value Date   MICRALBCREAT 33.3 (H) 11/28/2020    Lab Results  Component Value Date   FRUCTOSAMINE 248 08/08/2020   FRUCTOSAMINE 266 06/02/2017   FRUCTOSAMINE 233 02/04/2017     Other problems addressed today: See review of systems   Lab on 10/02/2021  Component Date Value Ref Range Status  Sodium 10/02/2021 139  135 - 145 mEq/L Final   Potassium 10/02/2021 4.8  3.5 - 5.1 mEq/L Final   Chloride 10/02/2021 105  96 - 112 mEq/L Final   CO2 10/02/2021 28  19 - 32 mEq/L Final   Glucose, Bld 10/02/2021 131 (H)  70 - 99 mg/dL Final   BUN 10/02/2021 25 (H)  6 - 23 mg/dL Final   Creatinine, Ser 10/02/2021 1.36  0.40 - 1.50 mg/dL Final   GFR 10/02/2021 51.10 (L)  >60.00 mL/min Final   Calculated using the CKD-EPI Creatinine Equation (2021)   Calcium 10/02/2021 9.1  8.4 - 10.5 mg/dL Final   Hgb A1c MFr Bld 10/02/2021 6.9 (H)  4.6 - 6.5 % Final   Glycemic Control Guidelines for People with Diabetes:Non Diabetic:  <6%Goal of Therapy: <7%Additional Action Suggested:  >8%       Allergies as of 10/05/2021   No Known Allergies      Medication List        Accurate as of October 05, 2021  8:32 AM. If you have any questions,  ask your nurse or doctor.          Accu-Chek Guide test strip Generic drug: glucose blood Use as instructed to check blood sugar once a day dx code E11.65   Accu-Chek Guide w/Device Kit Use to check blood sugar once a day   amLODipine 5 MG tablet Commonly known as: NORVASC   atorvastatin 80 MG tablet Commonly known as: LIPITOR Take 80 mg by mouth daily.   BD Insulin Syringe U/F 31G X 5/16" 1 ML Misc Generic drug: Insulin Syringe-Needle U-100 USE TO GIVE 2 INSULIN INJECTIONS DAILY   DULoxetine 60 MG capsule Commonly known as: CYMBALTA 60 mg daily.   finasteride 5 MG tablet Commonly known as: PROSCAR   Fish Oil 1000 MG Caps Take by mouth. 2x/day   Insulin Pen Needle 32G X 4 MM Misc Use on insulin pen   metFORMIN 1000 MG tablet Commonly known as: GLUCOPHAGE Take 1 tablet (1,000 mg total) by mouth 2 (two) times daily with a meal.   multivitamin with minerals Tabs tablet Take 1 tablet by mouth daily.   NovoLIN R 100 units/mL injection Generic drug: insulin regular INJECT 0.08 MLS(8 UNITS TOTAL) INTO THE SKIN TWICE DAILY BEFORE A MEAL; ALSO INJECT 10 UNITS UNDER THE SKIN ONCE DAILY AT SUPPER   olmesartan 20 MG tablet Commonly known as: BENICAR TAKE ONE TABLET BY MOUTH DAILY; (REPLACING BENICAR HCT-DOCTOR [place])   omeprazole 20 MG capsule Commonly known as: PRILOSEC Take 20 mg by mouth every other day. PRN   oxybutynin 5 MG 24 hr tablet Commonly known as: DITROPAN-XL 06/12/20 not started   tamsulosin 0.4 MG Caps capsule Commonly known as: FLOMAX Take 0.4 mg by mouth. Reported on 05/29/2016   Tyler Aas FlexTouch 200 UNIT/ML FlexTouch Pen Generic drug: insulin degludec INJECT 40 UNITS UNDER THE SKIN DAILY   Trulicity 1.5 MB/8.6LJ Sopn Generic drug: Dulaglutide Inject once weekly   UNABLE TO FIND Med Name: Prevagen        Allergies: No Known Allergies  Past Medical History:  Diagnosis Date   CKD (chronic kidney disease), stage III ([place])     Depression    Diabetes mellitus without complication ([place])    ED (erectile dysfunction)    GERD (gastroesophageal reflux disease)    Hyperlipidemia    Hypertension    Plantar fasciitis    Prostatitis     Past Surgical History:  Procedure Laterality Date  CIRCUMCISION     COLONOSCOPY     MOUTH SURGERY  01/2020    Family History  Problem Relation Age of Onset   Hepatitis C Mother    Mesothelioma Father    Diabetes Father    Heart disease Father     Social History:  reports that he quit smoking about 12 years ago. His smoking use included cigarettes. He has never used smokeless tobacco. He reports current alcohol use. He reports that he does not use drugs.    Review of Systems     RENAL dysfunction: Creatinine is as follows  Lab Results  Component Value Date   CREATININE 1.36 10/02/2021   CREATININE 1.32 08/07/2021   CREATININE 1.48 07/02/2021    Lipid history: Has been on Lipitor 80 mg for cholesterol control from his PCP  Labs show LDL 96 followed by PCP, still has high triglycerides     Lab Results  Component Value Date   CHOL 129 12/24/2016   HDL 23.70 (L) 12/24/2016   LDLCALC 97 02/02/2016   LDLDIRECT 84.0 04/07/2020   TRIG 210.0 (H) 12/24/2016   CHOLHDL 5 12/24/2016           Hypertension:Long-standing, currently treated with Benicar 20, amlodipine 5 mg daily, followed by PCP Blood pressure was relatively low previously with Benicar HCTZ 40/12.5   Recently has not had home blood pressure monitoring done He says the home nurse recently found his blood pressure to be about 160/80  With Benicar instead of losartan his urine microalbumin was last 33 compared to about 70   BP Readings from Last 3 Encounters:  10/05/21 (!) 142/98  08/07/21 (!) 145/85  07/05/21 106/64   He has been avoiding high potassium foods like bananas because of history of hyperkalemia  Lab Results  Component Value Date   K 4.8 10/02/2021    Review of Systems   Most  recent eye exam was in 09/2020, no retinopathy  Most recent foot exam: 05/2020    Physical Examination:  BP (!) 142/98   Pulse 64   Ht $R'5\' 9"'LN$  (1.753 m)   Wt 189 lb 3.2 oz (85.8 kg)   SpO2 98%   BMI 27.94 kg/m   Diabetic Foot Exam - Simple   Simple Foot Form Diabetic Foot exam was performed with the following findings: Yes   Visual Inspection No deformities, no ulcerations, no other skin breakdown bilaterally: Yes Sensation Testing Intact to touch and monofilament testing bilaterally: Yes Pulse Check Posterior Tibialis and Dorsalis pulse intact bilaterally: Yes Comments      ASSESSMENT:  Diabetes type 2, on insulin See history of present illness for detailed discussion of current diabetes management, blood sugar patterns and problems identified   His A1c is improved at 6.9  He is not checking his blood sugars consistently and does not remember recent readings Fasting lab glucose 131 Discussed that we are unable to adjust his insulin without adequate glucose monitoring and he will bring his meter for download  He may be getting higher readings when he goes off his diet with reading of carbohydrates or fruits Gaining weight without exercise  Hypertension with microalbuminuria: Blood pressure is now relatively high  Renal dysfunction: Stable with creatinine 1.36  Foot exam normal today  PLAN:    He will call back to let us know what his blood sugars are at home Make sure he takes his mealtime insulin consistently before eating No change in [place]  Restart 12.5 mg HCTZ for better blood  pressure control  Also since blood pressure is significantly high he will go up to Benicar twice a day  Reminded him to start checking his blood pressure regularly at home and call if consistently staying over 130/80 With instructions given Recheck microalbumin on the next visit He will follow-up with his PCP for his blood pressure management Encouraged him to start going to the  gym for exercise to reduce the continued weight gain Encouraged him to check his feet regularly  There are no Patient Instructions on file for this visit.   Elayne Snare 10/05/2021, 8:32 AM   Note: This office note was prepared with Dragon voice recognition system technology. Any transcriptional errors that result from this process are unintentional.

## 2021-10-23 DIAGNOSIS — R269 Unspecified abnormalities of gait and mobility: Secondary | ICD-10-CM | POA: Diagnosis not present

## 2021-10-23 DIAGNOSIS — Z9181 History of falling: Secondary | ICD-10-CM | POA: Diagnosis not present

## 2021-10-23 DIAGNOSIS — M6281 Muscle weakness (generalized): Secondary | ICD-10-CM | POA: Diagnosis not present

## 2021-10-26 DIAGNOSIS — J069 Acute upper respiratory infection, unspecified: Secondary | ICD-10-CM | POA: Diagnosis not present

## 2021-10-26 DIAGNOSIS — I10 Essential (primary) hypertension: Secondary | ICD-10-CM | POA: Diagnosis not present

## 2021-10-29 DIAGNOSIS — M6281 Muscle weakness (generalized): Secondary | ICD-10-CM | POA: Diagnosis not present

## 2021-10-29 DIAGNOSIS — Z9181 History of falling: Secondary | ICD-10-CM | POA: Diagnosis not present

## 2021-10-29 DIAGNOSIS — R269 Unspecified abnormalities of gait and mobility: Secondary | ICD-10-CM | POA: Diagnosis not present

## 2021-10-31 DIAGNOSIS — Z9181 History of falling: Secondary | ICD-10-CM | POA: Diagnosis not present

## 2021-10-31 DIAGNOSIS — M6281 Muscle weakness (generalized): Secondary | ICD-10-CM | POA: Diagnosis not present

## 2021-10-31 DIAGNOSIS — R269 Unspecified abnormalities of gait and mobility: Secondary | ICD-10-CM | POA: Diagnosis not present

## 2021-11-02 ENCOUNTER — Emergency Department (HOSPITAL_COMMUNITY)
Admission: EM | Admit: 2021-11-02 | Discharge: 2021-11-02 | Disposition: A | Discharge status: Home / Self Care | Payer: Medicare HMO | Attending: Emergency Medicine | Admitting: Emergency Medicine

## 2021-11-02 ENCOUNTER — Encounter (HOSPITAL_COMMUNITY): Payer: Self-pay | Admitting: Emergency Medicine

## 2021-11-02 ENCOUNTER — Other Ambulatory Visit: Payer: Self-pay

## 2021-11-02 DIAGNOSIS — R531 Weakness: Secondary | ICD-10-CM | POA: Diagnosis not present

## 2021-11-02 DIAGNOSIS — Z7984 Long term (current) use of oral hypoglycemic drugs: Secondary | ICD-10-CM | POA: Diagnosis not present

## 2021-11-02 DIAGNOSIS — Z79899 Other long term (current) drug therapy: Secondary | ICD-10-CM | POA: Insufficient documentation

## 2021-11-02 DIAGNOSIS — I493 Ventricular premature depolarization: Secondary | ICD-10-CM | POA: Diagnosis not present

## 2021-11-02 DIAGNOSIS — R55 Syncope and collapse: Secondary | ICD-10-CM | POA: Insufficient documentation

## 2021-11-02 DIAGNOSIS — E1122 Type 2 diabetes mellitus with diabetic chronic kidney disease: Secondary | ICD-10-CM | POA: Diagnosis not present

## 2021-11-02 DIAGNOSIS — R0902 Hypoxemia: Secondary | ICD-10-CM | POA: Diagnosis not present

## 2021-11-02 DIAGNOSIS — N183 Chronic kidney disease, stage 3 unspecified: Secondary | ICD-10-CM | POA: Diagnosis not present

## 2021-11-02 DIAGNOSIS — Z87891 Personal history of nicotine dependence: Secondary | ICD-10-CM | POA: Insufficient documentation

## 2021-11-02 DIAGNOSIS — I129 Hypertensive chronic kidney disease with stage 1 through stage 4 chronic kidney disease, or unspecified chronic kidney disease: Secondary | ICD-10-CM | POA: Diagnosis not present

## 2021-11-02 DIAGNOSIS — R42 Dizziness and giddiness: Secondary | ICD-10-CM | POA: Diagnosis present

## 2021-11-02 DIAGNOSIS — Z794 Long term (current) use of insulin: Secondary | ICD-10-CM | POA: Insufficient documentation

## 2021-11-02 DIAGNOSIS — I959 Hypotension, unspecified: Secondary | ICD-10-CM | POA: Diagnosis not present

## 2021-11-02 LAB — BASIC METABOLIC PANEL
Anion gap: 8 (ref 5–15)
BUN: 25 mg/dL — ABNORMAL HIGH (ref 8–23)
CO2: 21 mmol/L — ABNORMAL LOW (ref 22–32)
Calcium: 8.8 mg/dL — ABNORMAL LOW (ref 8.9–10.3)
Chloride: 106 mmol/L (ref 98–111)
Creatinine, Ser: 1.43 mg/dL — ABNORMAL HIGH (ref 0.61–1.24)
GFR, Estimated: 51 mL/min — ABNORMAL LOW (ref >60)
Glucose, Bld: 182 mg/dL — ABNORMAL HIGH (ref 70–99)
Potassium: 4.1 mmol/L (ref 3.5–5.1)
Sodium: 135 mmol/L (ref 135–145)

## 2021-11-02 LAB — CBC
HCT: 41.8 % (ref 39.0–52.0)
Hemoglobin: 14.2 g/dL (ref 13.0–17.0)
MCH: 29.7 pg (ref 26.0–34.0)
MCHC: 34 g/dL (ref 30.0–36.0)
MCV: 87.4 fL (ref 80.0–100.0)
Platelets: 162 10*3/uL (ref 150–400)
RBC: 4.78 MIL/uL (ref 4.22–5.81)
RDW: 13.2 % (ref 11.5–15.5)
WBC: 10 10*3/uL (ref 4.0–10.5)
nRBC: 0 % (ref 0.0–0.2)

## 2021-11-02 LAB — TROPONIN I (HIGH SENSITIVITY)
Troponin I (High Sensitivity): 12 ng/L (ref <18)
Troponin I (High Sensitivity): 13 ng/L (ref <18)

## 2021-11-02 MED ORDER — SODIUM CHLORIDE 0.9 % IV BOLUS
500.0000 mL | Freq: Once | INTRAVENOUS | Status: AC
  Administered 2021-11-02: 500 mL via INTRAVENOUS

## 2021-11-02 MED ORDER — SODIUM CHLORIDE 0.9 % IV BOLUS
1000.0000 mL | Freq: Once | INTRAVENOUS | Status: AC
  Administered 2021-11-02: 1000 mL via INTRAVENOUS

## 2021-11-02 NOTE — ED Provider Notes (Signed)
[facility] DEPT Provider Note   CSN: [id number] Arrival date & time: 11/02/21  1751     History Chief Complaint  Patient presents with   Hypotension    Joel Webb is a 75 y.o. male.  HPI Patient presents with near syncopal episode.  Was out walking his dog.  Began to feel lightheaded.  States he was generally weak.  Not lateralizing.  Not having chest pain.  Feeling better now.  Had hypotension for EMS with pressure in the 70s.  Received 400 cc of fluid by EMS and is feeling better.  Both on Friday and around a week and a half ago had increase in his blood pressure medicines for hypertension.  States he has not been feeling more lightheaded overall.  No orthostatic type changes before today.  No blood in the stool or black stools    Past Medical History:  Diagnosis Date   CKD (chronic kidney disease), stage III (HCC)    Depression    Diabetes mellitus without complication ([place])    ED (erectile dysfunction)    GERD (gastroesophageal reflux disease)    Hyperlipidemia    Hypertension    Plantar fasciitis    Prostatitis     Patient Active Problem List   Diagnosis Date Noted   Microalbuminuria due to type 2 diabetes mellitus ([place]) 05/25/2020   Essential hypertension 06/05/2017   Uncontrolled type 2 diabetes mellitus with hyperglycemia, with long-term current use of insulin ([place]) 02/28/2016   Mixed hyperlipidemia 02/28/2016    Past Surgical History:  Procedure Laterality Date   CIRCUMCISION     COLONOSCOPY     MOUTH SURGERY  01/2020       Family History  Problem Relation Age of Onset   Hepatitis C Mother    Mesothelioma Father    Diabetes Father    Heart disease Father     Social History   Tobacco Use   Smoking status: Former    Types: Cigarettes    Quit date: 12/20/2008    Years since quitting: 12.8   Smokeless tobacco: Never  Substance Use Topics   Alcohol use: Yes    Comment: rarely   Drug use: No    Home  Medications Prior to Admission medications   Medication Sig Start Date End Date Taking? Authorizing Provider  amLODipine (NORVASC) 5 MG tablet 1 tablet    [provider]  atorvastatin (LIPITOR) 80 MG tablet Take 80 mg by mouth daily.    [provider]  BD INSULIN SYRINGE U/F 31G X 5/16" 1 ML MISC USE TO GIVE 2 INSULIN INJECTIONS DAILY 01/01/21   Elayne Snare, MD  Blood Glucose Monitoring Suppl (ACCU-CHEK GUIDE) w/Device KIT Use to check blood sugar once a day 03/29/21   Elayne Snare, MD  Dulaglutide (TRULICITY) 1.5 RC/7.8LF SOPN Inject once weekly 03/29/21   Elayne Snare, MD  DULoxetine (CYMBALTA) 60 MG capsule 60 mg daily. 05/30/20   [provider]  finasteride (PROSCAR) 5 MG tablet  01/02/18   [provider]  glucose blood (ACCU-CHEK GUIDE) test strip Use as instructed to check blood sugar once a day dx code E11.65 03/29/21   Elayne Snare, MD  hydrochlorothiazide (MICROZIDE) 12.5 MG capsule Take 1 capsule (12.5 mg total) by mouth daily. 10/05/21   Elayne Snare, MD  Insulin Pen Needle 32G X 4 MM MISC Use on insulin pen 07/05/21   Elayne Snare, MD  metFORMIN (GLUCOPHAGE) 1000 MG tablet Take 1 tablet (1,000 mg total) by  mouth 2 (two) times daily with a meal. 06/25/21   Elayne Snare, MD  Multiple Vitamin (MULTIVITAMIN WITH MINERALS) TABS tablet Take 1 tablet by mouth daily.    [provider]  NOVOLIN R 100 UNIT/ML injection INJECT 0.08 MLS(8 UNITS TOTAL) INTO THE SKIN TWICE DAILY BEFORE A MEAL; ALSO INJECT 10 UNITS UNDER THE SKIN ONCE DAILY AT SUPPER 06/07/21   Elayne Snare, MD  olmesartan (BENICAR) 20 MG tablet TAKE ONE TABLET BY MOUTH DAILY; (REPLACING BENICAR HCT-DOCTOR KUMAR) 10/02/21   Elayne Snare, MD  Omega-3 Fatty Acids (FISH OIL) 1000 MG CAPS Take by mouth. 2x/day    [provider]  omeprazole (PRILOSEC) 20 MG capsule Take 20 mg by mouth every other day. PRN    [provider]  oxybutynin (DITROPAN-XL) 5 MG 24 hr tablet 06/12/20 not started  03/24/20   [provider]  tamsulosin (FLOMAX) 0.4 MG CAPS capsule Take 0.4 mg by mouth. Reported on 05/29/2016    [provider]  TRESIBA FLEXTOUCH 200 UNIT/ML FlexTouch Pen INJECT 40 UNITS UNDER THE SKIN DAILY 08/17/21   Elayne Snare, MD    Allergies    Patient has no known allergies.  Review of Systems   Review of Systems  Constitutional:  Negative for appetite change.  HENT:  Negative for congestion.   Respiratory:  Negative for shortness of breath.   Cardiovascular:  Negative for chest pain.  Gastrointestinal:  Negative for abdominal pain.  Genitourinary:  Negative for flank pain.  Neurological:  Positive for light-headedness.  Psychiatric/Behavioral:  Negative for confusion.    Physical Exam Updated Vital Signs BP 132/73    Pulse 96    Temp 98.4 F (36.9 C) (Oral)    Resp 19    Ht $R'5\' 9"'vZ$  (1.753 m)    Wt 83 kg    SpO2 98%    BMI 27.02 kg/m   Physical Exam Vitals and nursing note reviewed.  HENT:     Head: Normocephalic.  Eyes:     Extraocular Movements: Extraocular movements intact.  Cardiovascular:     Rate and Rhythm: Regular rhythm.  Pulmonary:     Breath sounds: No wheezing or rhonchi.  Abdominal:     Tenderness: There is no abdominal tenderness.  Musculoskeletal:        General: No tenderness.     Cervical back: Neck supple.  Skin:    General: Skin is warm.     Capillary Refill: Capillary refill takes less than 2 seconds.  Neurological:     Mental Status: He is alert and oriented to person, place, and time.    ED Results / Procedures / Treatments   Labs (all labs ordered are listed, but only abnormal results are displayed) Labs Reviewed  BASIC METABOLIC PANEL - Abnormal; Notable for the following components:      Result Value   CO2 21 (*)    Glucose, Bld 182 (*)    BUN 25 (*)    Creatinine, Ser 1.43 (*)    Calcium 8.8 (*)    GFR, Estimated 51 (*)    All other components within normal limits  CBC  TROPONIN I (HIGH SENSITIVITY)   TROPONIN I (HIGH SENSITIVITY)    EKG EKG Interpretation  Date/Time:  Friday November 02 2021 19:14:37 EST Ventricular Rate:  90 PR Interval:  187 QRS Duration: 100 QT Interval:  356 QTC Calculation: 436 R Axis:   21 Text Interpretation: Sinus rhythm pvcs Confirmed by Davonna Belling [phone]) on 11/02/2021 8:07:17 PM  Radiology No results found.  Procedures Procedures   Medications Ordered in ED Medications  sodium chloride 0.9 % bolus 500 mL (0 mLs Intravenous Stopped 11/02/21 2004)  sodium chloride 0.9 % bolus 1,000 mL (0 mLs Intravenous Stopped 11/02/21 2128)    ED Course  I have reviewed the triage vital signs and the nursing notes.  Pertinent labs & imaging results that were available during my care of the patient were reviewed by me and considered in my medical decision making (see chart for details).    MDM Rules/Calculators/A&P                         Patient with near syncopal episode.  Felt lightheaded.  Had been out walking and began felt as if he could pass out.  Initially hypotensive at home for wife and for EMS.  Recently his blood pressure increased and had his blood pressure medicines increased both 2 weeks ago and a few days ago.  Had both his olmesartan and his amlodipine increased.  Has not eaten much food today.  Had only some cookies and some pickles.  Creatinine just barely above his baseline.  Troponin normal.  EKG reassuring but did have PVCs.  Reviewing the monitor he did have few runs where he would have 3 or 4 PVCs in a row but nothing sustained.  Discussed with Dr. Rudi Rummage from cardiology.  He feels as if the patient would not need admission at this time.  Can follow-up as an outpatient for likely echo and monitoring.  Sounds as if it was more dehydration or medication related.  We will decrease his amlodipine back to what it was before.  Follow-up with cardiology and ambulatory referral placed    Final Clinical Impression(s) / ED  Diagnoses Final diagnoses:  Near syncope  PVC's (premature ventricular contractions)    Rx / DC Orders ED Discharge Orders          Ordered    Ambulatory referral to Cardiology        11/02/21 2140             Davonna Belling, MD 11/02/21 2320

## 2021-11-02 NOTE — Discharge Instructions (Addendum)
You are having frequent PVCs.  Follow-up with cardiology.  If you are starting to feel more lightheaded or dizzy or pass out return to the ER.  Go back to your previous dose of the amlodipine which appears to be 5 mg.  Try and keep yourself hydrated also.

## 2021-11-02 NOTE — ED Triage Notes (Signed)
PT to ER via EMS with reports of sudden onset weakness.  Pt had been out for a walk and upon returning home had noted weakness.  Wife used automated home BP machine with BP noted at 70s/40s.  EMS reports their initial BP was consistent with same.  Report of 110/80 lying 80/50 sitting.  Pt received 400cc fluid, states he is feeling better at this time.  Pt had changes to BP meds last Friday.

## 2021-11-06 DIAGNOSIS — M6281 Muscle weakness (generalized): Secondary | ICD-10-CM | POA: Diagnosis not present

## 2021-11-06 DIAGNOSIS — R269 Unspecified abnormalities of gait and mobility: Secondary | ICD-10-CM | POA: Diagnosis not present

## 2021-11-06 DIAGNOSIS — Z9181 History of falling: Secondary | ICD-10-CM | POA: Diagnosis not present

## 2021-11-08 DIAGNOSIS — Z9181 History of falling: Secondary | ICD-10-CM | POA: Diagnosis not present

## 2021-11-08 DIAGNOSIS — M6281 Muscle weakness (generalized): Secondary | ICD-10-CM | POA: Diagnosis not present

## 2021-11-08 DIAGNOSIS — R269 Unspecified abnormalities of gait and mobility: Secondary | ICD-10-CM | POA: Diagnosis not present

## 2021-11-16 DIAGNOSIS — E119 Type 2 diabetes mellitus without complications: Secondary | ICD-10-CM | POA: Diagnosis not present

## 2021-11-16 DIAGNOSIS — N400 Enlarged prostate without lower urinary tract symptoms: Secondary | ICD-10-CM | POA: Diagnosis not present

## 2021-11-16 DIAGNOSIS — E1122 Type 2 diabetes mellitus with diabetic chronic kidney disease: Secondary | ICD-10-CM | POA: Diagnosis not present

## 2021-11-16 DIAGNOSIS — E782 Mixed hyperlipidemia: Secondary | ICD-10-CM | POA: Diagnosis not present

## 2021-11-16 DIAGNOSIS — N1831 Chronic kidney disease, stage 3a: Secondary | ICD-10-CM | POA: Diagnosis not present

## 2021-11-16 DIAGNOSIS — K219 Gastro-esophageal reflux disease without esophagitis: Secondary | ICD-10-CM | POA: Diagnosis not present

## 2021-11-16 DIAGNOSIS — I10 Essential (primary) hypertension: Secondary | ICD-10-CM | POA: Diagnosis not present

## 2021-11-20 ENCOUNTER — Other Ambulatory Visit: Payer: Self-pay | Admitting: Endocrinology

## 2021-11-21 ENCOUNTER — Telehealth: Payer: Self-pay

## 2021-11-21 ENCOUNTER — Other Ambulatory Visit (HOSPITAL_COMMUNITY): Payer: Self-pay

## 2021-11-21 NOTE — Telephone Encounter (Signed)
Patient Advocate Encounter   RTC for Trulicity 3/64 days supply.  Copay is $42 for 1st fill (Transitional).  PA must be submitted after 30 days.

## 2021-11-28 DIAGNOSIS — R269 Unspecified abnormalities of gait and mobility: Secondary | ICD-10-CM | POA: Diagnosis not present

## 2021-11-28 DIAGNOSIS — Z9181 History of falling: Secondary | ICD-10-CM | POA: Diagnosis not present

## 2021-11-28 DIAGNOSIS — M6281 Muscle weakness (generalized): Secondary | ICD-10-CM | POA: Diagnosis not present

## 2021-12-03 DIAGNOSIS — Z9181 History of falling: Secondary | ICD-10-CM | POA: Diagnosis not present

## 2021-12-03 DIAGNOSIS — M6281 Muscle weakness (generalized): Secondary | ICD-10-CM | POA: Diagnosis not present

## 2021-12-03 DIAGNOSIS — R269 Unspecified abnormalities of gait and mobility: Secondary | ICD-10-CM | POA: Diagnosis not present

## 2021-12-05 DIAGNOSIS — Z9181 History of falling: Secondary | ICD-10-CM | POA: Diagnosis not present

## 2021-12-05 DIAGNOSIS — M6281 Muscle weakness (generalized): Secondary | ICD-10-CM | POA: Diagnosis not present

## 2021-12-05 DIAGNOSIS — R269 Unspecified abnormalities of gait and mobility: Secondary | ICD-10-CM | POA: Diagnosis not present

## 2021-12-11 DIAGNOSIS — R269 Unspecified abnormalities of gait and mobility: Secondary | ICD-10-CM | POA: Diagnosis not present

## 2021-12-11 DIAGNOSIS — Z9181 History of falling: Secondary | ICD-10-CM | POA: Diagnosis not present

## 2021-12-11 DIAGNOSIS — M6281 Muscle weakness (generalized): Secondary | ICD-10-CM | POA: Diagnosis not present

## 2021-12-13 DIAGNOSIS — M6281 Muscle weakness (generalized): Secondary | ICD-10-CM | POA: Diagnosis not present

## 2021-12-13 DIAGNOSIS — Z794 Long term (current) use of insulin: Secondary | ICD-10-CM | POA: Diagnosis not present

## 2021-12-13 DIAGNOSIS — N1831 Chronic kidney disease, stage 3a: Secondary | ICD-10-CM | POA: Diagnosis not present

## 2021-12-13 DIAGNOSIS — G3184 Mild cognitive impairment, so stated: Secondary | ICD-10-CM | POA: Diagnosis not present

## 2021-12-13 DIAGNOSIS — F339 Major depressive disorder, recurrent, unspecified: Secondary | ICD-10-CM | POA: Diagnosis not present

## 2021-12-13 DIAGNOSIS — Z7985 Long-term (current) use of injectable non-insulin antidiabetic drugs: Secondary | ICD-10-CM | POA: Diagnosis not present

## 2021-12-13 DIAGNOSIS — I10 Essential (primary) hypertension: Secondary | ICD-10-CM | POA: Diagnosis not present

## 2021-12-13 DIAGNOSIS — Z9181 History of falling: Secondary | ICD-10-CM | POA: Diagnosis not present

## 2021-12-13 DIAGNOSIS — E782 Mixed hyperlipidemia: Secondary | ICD-10-CM | POA: Diagnosis not present

## 2021-12-13 DIAGNOSIS — Z0000 Encounter for general adult medical examination without abnormal findings: Secondary | ICD-10-CM | POA: Diagnosis not present

## 2021-12-13 DIAGNOSIS — R269 Unspecified abnormalities of gait and mobility: Secondary | ICD-10-CM | POA: Diagnosis not present

## 2021-12-13 DIAGNOSIS — E1122 Type 2 diabetes mellitus with diabetic chronic kidney disease: Secondary | ICD-10-CM | POA: Diagnosis not present

## 2021-12-13 DIAGNOSIS — Z1389 Encounter for screening for other disorder: Secondary | ICD-10-CM | POA: Diagnosis not present

## 2021-12-13 DIAGNOSIS — Z7984 Long term (current) use of oral hypoglycemic drugs: Secondary | ICD-10-CM | POA: Diagnosis not present

## 2021-12-18 DIAGNOSIS — M6281 Muscle weakness (generalized): Secondary | ICD-10-CM | POA: Diagnosis not present

## 2021-12-18 DIAGNOSIS — R269 Unspecified abnormalities of gait and mobility: Secondary | ICD-10-CM | POA: Diagnosis not present

## 2021-12-18 DIAGNOSIS — Z9181 History of falling: Secondary | ICD-10-CM | POA: Diagnosis not present

## 2021-12-20 DIAGNOSIS — Z9181 History of falling: Secondary | ICD-10-CM | POA: Diagnosis not present

## 2021-12-20 DIAGNOSIS — M6281 Muscle weakness (generalized): Secondary | ICD-10-CM | POA: Diagnosis not present

## 2021-12-20 DIAGNOSIS — R269 Unspecified abnormalities of gait and mobility: Secondary | ICD-10-CM | POA: Diagnosis not present

## 2021-12-24 ENCOUNTER — Ambulatory Visit (HOSPITAL_BASED_OUTPATIENT_CLINIC_OR_DEPARTMENT_OTHER): Payer: PPO | Admitting: Cardiovascular Disease

## 2021-12-24 DIAGNOSIS — N1831 Chronic kidney disease, stage 3a: Secondary | ICD-10-CM | POA: Diagnosis not present

## 2021-12-24 DIAGNOSIS — F320 Major depressive disorder, single episode, mild: Secondary | ICD-10-CM | POA: Diagnosis not present

## 2021-12-24 DIAGNOSIS — E782 Mixed hyperlipidemia: Secondary | ICD-10-CM | POA: Diagnosis not present

## 2021-12-24 DIAGNOSIS — I10 Essential (primary) hypertension: Secondary | ICD-10-CM | POA: Diagnosis not present

## 2021-12-24 DIAGNOSIS — E1122 Type 2 diabetes mellitus with diabetic chronic kidney disease: Secondary | ICD-10-CM | POA: Diagnosis not present

## 2021-12-24 NOTE — Progress Notes (Deleted)
Cardiology Office Note   Date:  12/24/2021   ID:  [place] GROSSER, DOB Jan 13, 1946, MRN [id number]  PCP:  Tally Joe, MD  Cardiologist:   Chilton Si, MD   No chief complaint on file.     History of Present Illness: Joel Webb is a 76 y.o. male with diabetes, hypertension, and hyperlipidemia who is being seen today for the evaluation of near syncope and PVCs at the request of Benjiman Core, MD.     Past Medical History:  Diagnosis Date   CKD (chronic kidney disease), stage III (HCC)    Depression    Diabetes mellitus without complication (HCC)    ED (erectile dysfunction)    GERD (gastroesophageal reflux disease)    Hyperlipidemia    Hypertension    Plantar fasciitis    Prostatitis     Past Surgical History:  Procedure Laterality Date   CIRCUMCISION     COLONOSCOPY     MOUTH SURGERY  01/2020     Current Outpatient Medications  Medication Sig Dispense Refill   amLODipine (NORVASC) 5 MG tablet 1 tablet     atorvastatin (LIPITOR) 80 MG tablet Take 80 mg by mouth daily.     BD INSULIN SYRINGE U/F 31G X 5/16" 1 ML MISC USE TO GIVE 2 INSULIN INJECTIONS DAILY 200 each 5   Blood Glucose Monitoring Suppl (ACCU-CHEK GUIDE) w/Device KIT Use to check blood sugar once a day 1 kit 0   Dulaglutide (TRULICITY) 1.5 MG/0.5ML SOPN INJECT 1.5 MG UNDER THE SKIN ONCE WEEKLY 2 mL 2   DULoxetine (CYMBALTA) 60 MG capsule 60 mg daily.     finasteride (PROSCAR) 5 MG tablet      glucose blood (ACCU-CHEK GUIDE) test strip Use as instructed to check blood sugar once a day dx code E11.65 100 each 1   hydrochlorothiazide (MICROZIDE) 12.5 MG capsule Take 1 capsule (12.5 mg total) by mouth daily. 90 capsule 1   Insulin Pen Needle 32G X 4 MM MISC Use on insulin pen 90 each 1   metFORMIN (GLUCOPHAGE) 1000 MG tablet Take 1 tablet (1,000 mg total) by mouth 2 (two) times daily with a meal. 180 tablet 2   Multiple Vitamin (MULTIVITAMIN WITH MINERALS) TABS tablet Take 1 tablet by  mouth daily.     NOVOLIN R 100 UNIT/ML injection INJECT 0.08 MLS(8 UNITS TOTAL) INTO THE SKIN TWICE DAILY BEFORE A MEAL; ALSO INJECT 10 UNITS UNDER THE SKIN ONCE DAILY AT SUPPER 10 mL 3   olmesartan (BENICAR) 20 MG tablet TAKE ONE TABLET BY MOUTH DAILY; (REPLACING BENICAR HCT-DOCTOR [place]UM[place]) 90 tablet 1   Omega-3 Fatty Acids (FISH OIL) 1000 MG CAPS Take by mouth. 2x/day     omeprazole (PRILOSEC) 20 MG capsule Take 20 mg by mouth every other day. PRN     oxybutynin (DITROPAN-XL) 5 MG 24 hr tablet 06/12/20 not started     tamsulosin (FLOMAX) 0.4 MG CAPS capsule Take 0.4 mg by mouth. Reported on 05/29/2016     TRESIBA FLEXTOUCH 200 UNIT/ML FlexTouch Pen INJECT 40 UNITS UNDER THE SKIN DAILY 18 mL 2   No current facility-administered medications for this visit.    Allergies:   Patient has no known allergies.    Social History:  The patient  reports that he quit smoking about 13 years ago. His smoking use included cigarettes. He has never used smokeless tobacco. He reports current alcohol use. He reports that he does not use drugs.   Family History:  The  patient's ***family history includes Diabetes in his father; Heart disease in his father; Hepatitis C in his mother; Mesothelioma in his father.    ROS:  Please see the history of present illness.   Otherwise, review of systems are positive for {NONE DEFAULTED:18576}.   All other systems are reviewed and negative.    PHYSICAL EXAM: VS:  There were no vitals taken for this visit. , BMI There is no height or weight on file to calculate BMI. GENERAL:  Well appearing HEENT:  Pupils equal round and reactive, fundi not visualized, oral mucosa unremarkable NECK:  No jugular venous distention, waveform within normal limits, carotid upstroke brisk and symmetric, no bruits, no thyromegaly LYMPHATICS:  No cervical adenopathy LUNGS:  Clear to auscultation bilaterally HEART:  RRR.  PMI not displaced or sustained,S1 and S2 within normal limits, no S3, no S4,  no clicks, no rubs, *** murmurs ABD:  Flat, positive bowel sounds normal in frequency in pitch, no bruits, no rebound, no guarding, no midline pulsatile mass, no hepatomegaly, no splenomegaly EXT:  2 plus pulses throughout, no edema, no cyanosis no clubbing SKIN:  No rashes no nodules NEURO:  Cranial nerves II through XII grossly intact, motor grossly intact throughout PSYCH:  Cognitively intact, oriented to person place and time    EKG:  EKG {ACTION; IS/IS IAX:[id number]} ordered today. The ekg ordered today demonstrates ***   Recent Labs: 11/02/2021: BUN 25; Creatinine, Ser 1.43; Hemoglobin 14.2; Platelets 162; Potassium 4.1; Sodium 135    Lipid Panel    Component Value Date/Time   CHOL 129 12/24/2016 0804   TRIG 210.0 (H) 12/24/2016 0804   HDL 23.70 (L) 12/24/2016 0804   CHOLHDL 5 12/24/2016 0804   VLDL 42.0 (H) 12/24/2016 0804   LDLCALC 97 02/02/2016 0000   LDLDIRECT 84.0 04/07/2020 0945      Wt Readings from Last 3 Encounters:  11/02/21 183 lb (83 kg)  10/05/21 189 lb 3.2 oz (85.8 kg)  08/07/21 185 lb 9.6 oz (84.2 kg)      ASSESSMENT AND PLAN:  ***   Current medicines are reviewed at length with the patient today.  The patient {ACTIONS; HAS/DOES NOT HAVE:19233} concerns regarding medicines.  The following changes have been made:  {PLAN; NO CHANGE:13088:s}  Labs/ tests ordered today include: *** No orders of the defined types were placed in this encounter.    Disposition:   FU with ***     Signed, Doranne Schmutz C. Oval Linsey, MD, [facility]  12/24/2021 10:39 AM    [place]

## 2021-12-25 DIAGNOSIS — Z9181 History of falling: Secondary | ICD-10-CM | POA: Diagnosis not present

## 2021-12-25 DIAGNOSIS — M6281 Muscle weakness (generalized): Secondary | ICD-10-CM | POA: Diagnosis not present

## 2021-12-25 DIAGNOSIS — R269 Unspecified abnormalities of gait and mobility: Secondary | ICD-10-CM | POA: Diagnosis not present

## 2021-12-27 DIAGNOSIS — R269 Unspecified abnormalities of gait and mobility: Secondary | ICD-10-CM | POA: Diagnosis not present

## 2021-12-27 DIAGNOSIS — Z9181 History of falling: Secondary | ICD-10-CM | POA: Diagnosis not present

## 2021-12-27 DIAGNOSIS — M6281 Muscle weakness (generalized): Secondary | ICD-10-CM | POA: Diagnosis not present

## 2022-01-08 ENCOUNTER — Other Ambulatory Visit (INDEPENDENT_AMBULATORY_CARE_PROVIDER_SITE_OTHER): Payer: PPO

## 2022-01-08 ENCOUNTER — Other Ambulatory Visit (HOSPITAL_COMMUNITY): Payer: Self-pay

## 2022-01-08 ENCOUNTER — Other Ambulatory Visit: Payer: Self-pay

## 2022-01-08 DIAGNOSIS — E782 Mixed hyperlipidemia: Secondary | ICD-10-CM | POA: Diagnosis not present

## 2022-01-08 DIAGNOSIS — R809 Proteinuria, unspecified: Secondary | ICD-10-CM | POA: Diagnosis not present

## 2022-01-08 DIAGNOSIS — E1129 Type 2 diabetes mellitus with other diabetic kidney complication: Secondary | ICD-10-CM

## 2022-01-08 DIAGNOSIS — E1165 Type 2 diabetes mellitus with hyperglycemia: Secondary | ICD-10-CM | POA: Diagnosis not present

## 2022-01-08 DIAGNOSIS — Z794 Long term (current) use of insulin: Secondary | ICD-10-CM | POA: Diagnosis not present

## 2022-01-08 LAB — COMPREHENSIVE METABOLIC PANEL
ALT: 18 U/L (ref 0–53)
AST: 18 U/L (ref 0–37)
Albumin: 4.1 g/dL (ref 3.5–5.2)
Alkaline Phosphatase: 97 U/L (ref 39–117)
BUN: 37 mg/dL — ABNORMAL HIGH (ref 6–23)
CO2: 28 mEq/L (ref 19–32)
Calcium: 9.4 mg/dL (ref 8.4–10.5)
Chloride: 106 mEq/L (ref 96–112)
Creatinine, Ser: 1.65 mg/dL — ABNORMAL HIGH (ref 0.40–1.50)
GFR: 40.45 mL/min — ABNORMAL LOW (ref >60.00)
Glucose, Bld: 70 mg/dL (ref 70–99)
Potassium: 4.9 mEq/L (ref 3.5–5.1)
Sodium: 140 mEq/L (ref 135–145)
Total Bilirubin: 0.5 mg/dL (ref 0.2–1.2)
Total Protein: 6.6 g/dL (ref 6.0–8.3)

## 2022-01-08 LAB — MICROALBUMIN / CREATININE URINE RATIO
Creatinine,U: 76.1 mg/dL
Microalb Creat Ratio: 54.3 mg/g — ABNORMAL HIGH (ref 0.0–30.0)
Microalb, Ur: 41.3 mg/dL — ABNORMAL HIGH (ref 0.0–1.9)

## 2022-01-08 LAB — HEMOGLOBIN A1C: Hgb A1c MFr Bld: 7.6 % — ABNORMAL HIGH (ref 4.6–6.5)

## 2022-01-08 LAB — LDL CHOLESTEROL, DIRECT: Direct LDL: 107 mg/dL

## 2022-01-09 ENCOUNTER — Other Ambulatory Visit: Payer: Medicare HMO

## 2022-01-11 ENCOUNTER — Ambulatory Visit: Payer: PPO | Admitting: Endocrinology

## 2022-01-11 ENCOUNTER — Encounter: Payer: Self-pay | Admitting: Endocrinology

## 2022-01-11 ENCOUNTER — Other Ambulatory Visit: Payer: Self-pay

## 2022-01-11 VITALS — BP 108/68 | HR 70 | Ht 69.5 in | Wt 189.2 lb

## 2022-01-11 DIAGNOSIS — I10 Essential (primary) hypertension: Secondary | ICD-10-CM

## 2022-01-11 DIAGNOSIS — I952 Hypotension due to drugs: Secondary | ICD-10-CM

## 2022-01-11 DIAGNOSIS — Z794 Long term (current) use of insulin: Secondary | ICD-10-CM

## 2022-01-11 DIAGNOSIS — N289 Disorder of kidney and ureter, unspecified: Secondary | ICD-10-CM

## 2022-01-11 DIAGNOSIS — E1165 Type 2 diabetes mellitus with hyperglycemia: Secondary | ICD-10-CM

## 2022-01-11 MED ORDER — METFORMIN HCL 1000 MG PO TABS: 1000.0000 mg | ORAL_TABLET | Freq: Two times a day (BID) | ORAL | 2 refills | Status: DC

## 2022-01-11 MED ORDER — ACCU-CHEK GUIDE VI STRP: ORAL_STRIP | 2 refills | Status: DC

## 2022-01-11 NOTE — Patient Instructions (Addendum)
Check blood sugars on waking up 3 days a week  Also check blood sugars about 2 hours after meals and do this after different meals by rotation  Recommended blood sugar levels on waking up are 90-130 and about 2 hours after meal is 130-180  Please bring your blood sugar monitor to each visit, thank you  Take 1/2 of 10mg  amlodipine  Trulicity 3mg  from next Rx

## 2022-01-11 NOTE — Progress Notes (Signed)
Patient ID: Joel Webb, male   DOB: [birth year], 76 y.o.   MRN: [id number]           Reason for Appointment: Follow-up for Type 2 Diabetes and related problems    Referring physician: Dr. Moreen Fowler  History of Present Illness:          Date of diagnosis of type 2 diabetes mellitus: 2007       Background history:   He is unclear when he was diagnosed to have diabetes but was not very symptomatic He had been on metformin alone for several years with reportedly good control Also had previously been very consistent with exercise and diet His blood sugars probably started going up 3-4 years ago and he was given Lantus insulin in addition He was seen in consultation in 4/17 with an A1c of 8.4 and he was started Trulicity in addition to his Lantus and metformin This was stopped in 7/17 because of high out-of-pocket expense  The V-go pump was stopped because he found it too uncomfortable and not sticking well  Recent history:   INSULIN regimen is: [place] 40 units in p.m., Novolin R 6 at breakfast and 8  lunchtime; 12 units at supper  Non-insulin hypoglycemic drugs the patient is taking are: metformin 1 g qd-bid a day, Trulicity 1.5 mg weekly  Current management, blood sugar patterns and problems identified:   His A1c is 6.9, was 7.4 compared to 7.1  He has brought his blood sugar meter for download, previously had not brought it for download and was not taking regularly  He apparently takes his regular insulin after eating instead of before and not clear what the timing of this is Not clear why his fasting blood sugars are variable even though he thinks he is taking his [place] regularly Since he has had some memory issues he may not be always taking his regular insulin with his meals with highest reading 231 around dinnertime Also he had missed refilling his Trulicity last month and only started back about 2 weeks ago which may have caused some higher readings He is doing a  little bit of walking and some exercises at the gym Weight is about the same as on his last visit No hypoglycemia reported He is supposed to be on metformin twice a day but he may sometimes forget the evening dose He is reluctant to use the freestyle [place] system as he does not want to keep any object on his arm  Glucose monitoring:  done less than 1 times a day         Glucometer:  Accu-Chek  Blood Glucose readings from meter review   PRE-MEAL Fasting Lunch Dinner Bedtime Overall  Glucose range: 111-194 215 147-231    Mean/median:     189     Self-care:  Usually tries to limit High-fat foods and portions                 Dietician visit, most recent:6/17                Weight history:  Wt Readings from Last 3 Encounters:  01/11/22 189 lb 3.2 oz (85.8 kg)  11/02/21 183 lb (83 kg)  10/05/21 189 lb 3.2 oz (85.8 kg)    Glycemic control:   Lab Results  Component Value Date   HGBA1C 7.6 (H) 01/08/2022   HGBA1C 6.9 (H) 10/02/2021   HGBA1C 7.4 (H) 07/02/2021   Lab Results  Component Value Date   MICROALBUR  41.3 (H) 01/08/2022   LDLCALC 97 02/02/2016   CREATININE 1.65 (H) 01/08/2022   Lab Results  Component Value Date   MICRALBCREAT 54.3 (H) 01/08/2022    Lab Results  Component Value Date   FRUCTOSAMINE 248 08/08/2020   FRUCTOSAMINE 266 06/02/2017   FRUCTOSAMINE 233 02/04/2017     Other problems addressed today: See review of systems   Lab on 01/08/2022  Component Date Value Ref Range Status   Direct LDL 01/08/2022 107.0  mg/dL Final   Optimal:  <100 mg/dLNear or Above Optimal:  100-129 mg/dLBorderline High:  130-159 mg/dLHigh:  160-189 mg/dLVery High:  >190 mg/dL   Microalb, Ur 01/08/2022 41.3 (H)  0.0 - 1.9 mg/dL Final   Creatinine,U 01/08/2022 76.1  mg/dL Final   Microalb Creat Ratio 01/08/2022 54.3 (H)  0.0 - 30.0 mg/g Final   Sodium 01/08/2022 140  135 - 145 mEq/L Final   Potassium 01/08/2022 4.9  3.5 - 5.1 mEq/L Final   Chloride 01/08/2022 106  96 -  112 mEq/L Final   CO2 01/08/2022 28  19 - 32 mEq/L Final   Glucose, Bld 01/08/2022 70  70 - 99 mg/dL Final   BUN 01/08/2022 37 (H)  6 - 23 mg/dL Final   Creatinine, Ser 01/08/2022 1.65 (H)  0.40 - 1.50 mg/dL Final   Total Bilirubin 01/08/2022 0.5  0.2 - 1.2 mg/dL Final   Alkaline Phosphatase 01/08/2022 97  39 - 117 U/L Final   AST 01/08/2022 18  0 - 37 U/L Final   ALT 01/08/2022 18  0 - 53 U/L Final   Total Protein 01/08/2022 6.6  6.0 - 8.3 g/dL Final   Albumin 01/08/2022 4.1  3.5 - 5.2 g/dL Final   GFR 01/08/2022 40.45 (L)  >60.00 mL/min Final   Calculated using the CKD-EPI Creatinine Equation (2021)   Calcium 01/08/2022 9.4  8.4 - 10.5 mg/dL Final   Hgb A1c MFr Bld 01/08/2022 7.6 (H)  4.6 - 6.5 % Final   Glycemic Control Guidelines for People with Diabetes:Non Diabetic:  <6%Goal of Therapy: <7%Additional Action Suggested:  >8%       Allergies as of 01/11/2022   No Known Allergies      Medication List        Accurate as of January 11, 2022  9:37 AM. If you have any questions, ask your nurse or doctor.          Accu-Chek Guide test strip Generic drug: glucose blood Use as instructed to check blood sugar once a day dx code E11.65   Accu-Chek Guide w/Device Kit Use to check blood sugar once a day   amLODipine 5 MG tablet Commonly known as: NORVASC 10 mg.   atorvastatin 80 MG tablet Commonly known as: LIPITOR Take 80 mg by mouth daily.   BD Insulin Syringe U/F 31G X 5/16" 1 ML Misc Generic drug: Insulin Syringe-Needle U-100 USE TO GIVE 2 INSULIN INJECTIONS DAILY   DULoxetine 60 MG capsule Commonly known as: CYMBALTA 60 mg daily.   finasteride 5 MG tablet Commonly known as: PROSCAR   Fish Oil 1000 MG Caps Take by mouth. 2x/day   hydrochlorothiazide 12.5 MG capsule Commonly known as: MICROZIDE Take 1 capsule (12.5 mg total) by mouth daily.   Insulin Pen Needle 32G X 4 MM Misc Use on insulin pen   metFORMIN 1000 MG tablet Commonly known as:  GLUCOPHAGE Take 1 tablet (1,000 mg total) by mouth 2 (two) times daily with a meal.   multivitamin with minerals  Tabs tablet Take 1 tablet by mouth daily.   NovoLIN R 100 units/mL injection Generic drug: insulin regular INJECT 0.08 MLS(8 UNITS TOTAL) INTO THE SKIN TWICE DAILY BEFORE A MEAL; ALSO INJECT 10 UNITS UNDER THE SKIN ONCE DAILY AT SUPPER What changed: See the new instructions.   olmesartan 20 MG tablet Commonly known as: BENICAR TAKE ONE TABLET BY MOUTH DAILY; (REPLACING BENICAR HCT-DOCTOR Aryahi Denzler)   omeprazole 20 MG capsule Commonly known as: PRILOSEC Take 20 mg by mouth every other day. PRN   oxybutynin 5 MG 24 hr tablet Commonly known as: DITROPAN-XL 06/12/20 not started   tamsulosin 0.4 MG Caps capsule Commonly known as: FLOMAX Take 0.4 mg by mouth. Reported on 05/29/2016   Tyler Aas FlexTouch 200 UNIT/ML FlexTouch Pen Generic drug: insulin degludec INJECT 40 UNITS UNDER THE SKIN DAILY   Trulicity 1.5 PZ/0.2HE Sopn Generic drug: Dulaglutide INJECT 1.5 MG UNDER THE SKIN ONCE WEEKLY        Allergies: No Known Allergies  Past Medical History:  Diagnosis Date   CKD (chronic kidney disease), stage III (HCC)    Depression    Diabetes mellitus without complication ([place])    ED (erectile dysfunction)    GERD (gastroesophageal reflux disease)    Hyperlipidemia    Hypertension    Plantar fasciitis    Prostatitis     Past Surgical History:  Procedure Laterality Date   CIRCUMCISION     COLONOSCOPY     MOUTH SURGERY  01/2020    Family History  Problem Relation Age of Onset   Hepatitis C Mother    Mesothelioma Father    Diabetes Father    Heart disease Father     Social History:  reports that he quit smoking about 13 years ago. His smoking use included cigarettes. He has never used smokeless tobacco. He reports current alcohol use. He reports that he does not use drugs.    Review of Systems     RENAL dysfunction: Creatinine is as follows  Lab  Results  Component Value Date   CREATININE 1.65 (H) 01/08/2022   CREATININE 1.43 (H) 11/02/2021   CREATININE 1.36 10/02/2021    Lipid history: Has been on Lipitor 80 mg for cholesterol control from his PCP  Labs show LDL 96 followed by PCP, still has high triglycerides    Lab Results  Component Value Date   CHOL 129 12/24/2016   HDL 23.70 (L) 12/24/2016   LDLCALC 97 02/02/2016   LDLDIRECT 107.0 01/08/2022   TRIG 210.0 (H) 12/24/2016   CHOLHDL 5 12/24/2016           Hypertension:Long-standing, currently treated with Benicar HCTZ, amlodipine 10 mg daily, followed by PCP When his amlodipine was increased by PCP he apparently had a low blood pressure episode but not clear what regimen he is on now  His emergency room visit information was reviewed in detail   Not clear what his blood pressure readings are at home  With Benicar instead of losartan his urine microalbumin was last 33 compared to about 70   BP Readings from Last 3 Encounters:  01/11/22 118/68  11/02/21 132/73  10/05/21 (!) 160/95   Previous history of hyperkalemia  Lab Results  Component Value Date   K 4.9 01/08/2022    Review of Systems   Most recent eye exam was in 09/2020, no retinopathy  Most recent foot exam: 05/2020    Physical Examination:  BP 118/68    Pulse 70    Ht 5' 9.5" (  1.765 m)    Wt 189 lb 3.2 oz (85.8 kg)    SpO2 99%    BMI 27.54 kg/m     ASSESSMENT:  Diabetes type 2, on insulin See history of present illness for detailed discussion of current diabetes management, blood sugar patterns and problems identified   His A1c is back up to 7.6  He is having issues with dietary compliance, taking his insulin regularly on time and timing of his mealtime insulin Also recently blood sugars were higher when he was off Trulicity as he did not refill it for at least a couple of weeks Most recent lab glucose is 70 fasting May have lower readings overnight if he is taking his regular  insulin late postprandially  He may be getting higher readings when he goes off his diet with reading of carbohydrates or fruits Gaining weight without exercise  Hypertension with microalbuminuria: Blood pressure is now below even though he is asymptomatic Appears to be on 3 drugs and blood pressure tends to fluctuate, followed by PCP  Renal dysfunction: Creatinine is likely higher because of relatively low blood pressure and also being on ARB drug  MICROALBUMINURIA: Still persistent and likely needs to continue maximum dose olmesartan, will also forward results to PCP  PLAN:    His wife is now going to help him take his insulin doses consistently on time because of his difficulties with memory Since she is not on analog insulin will not be a candidate for the [place] Emphasized the need to take regular insulin BEFORE starting to eat up to 30 minutes For now will not change his insulin doses but have him monitor his blood sugars more consistently and review again in 2 months  Also he will need to let us know if he starts getting any low sugars We will increase his Trulicity to 3 mg weekly so that he can do better with carbohydrate cravings and reduce mealtime portions Since renal function is affected he can reduce his METFORMIN to once in the morning only and this will potentially reduce any low sugars overnight Discussed checking blood sugars alternating fasting and after meals Also again discussed use of the freestyle libre or other continuous glucose monitoring system but he is refusing to consider putting any sensor on his arm We will discuss this further on his next visit and see if he will be open to at least trying a sample Encouraged him to be regular with exercise  Recommended keeping a written record of his blood pressure readings to bring to both his PCP and over here For his hypertension we will temporarily reduce his AMLODIPINE to half a tablet and have him discuss further  management and follow-up with renal dysfunction with his PCP  There are no Patient Instructions on file for this visit.  Total visit time including counseling = 40 minutes  Elayne Snare 01/11/2022, 9:37 AM   Note: This office note was prepared with Dragon voice recognition system technology. Any transcriptional errors that result from this process are unintentional.

## 2022-01-14 ENCOUNTER — Encounter: Payer: Self-pay | Admitting: Endocrinology

## 2022-01-15 ENCOUNTER — Other Ambulatory Visit (HOSPITAL_COMMUNITY): Payer: Self-pay

## 2022-01-15 NOTE — Telephone Encounter (Signed)
Received a voicemail from patient that PA was denied because diagnosis code was needed.

## 2022-01-18 ENCOUNTER — Other Ambulatory Visit (HOSPITAL_COMMUNITY): Payer: Self-pay

## 2022-01-18 NOTE — Telephone Encounter (Addendum)
I just resubmitted the PA & this is the rejection. ?

## 2022-01-18 NOTE — Telephone Encounter (Signed)
Please submit appeal. Patient called again and states his insurance company will not move forward with his Trulicity until PA is complete. I know you did a test claim but patient is being told something different and he has used his last pen. ?

## 2022-01-21 ENCOUNTER — Other Ambulatory Visit: Payer: Self-pay | Admitting: Endocrinology

## 2022-01-21 NOTE — Telephone Encounter (Signed)
Patient came by to drop letters off that insurance sent him. Is it ok to fax these over to you? ?

## 2022-01-21 NOTE — Telephone Encounter (Signed)
Called patient to let him know. He will stop by today to bring copy of letter he received from insurance company. ?

## 2022-01-23 MED ORDER — OZEMPIC (0.25 OR 0.5 MG/DOSE) 2 MG/1.5ML ~~LOC~~ SOPN: 0.5000 mg | PEN_INJECTOR | SUBCUTANEOUS | 1 refills | Status: DC

## 2022-01-23 NOTE — Addendum Note (Signed)
Addended by: Cinda Quest on: 01/23/2022 03:03 PM ? ? Modules accepted: Orders ? ?

## 2022-01-28 ENCOUNTER — Ambulatory Visit (INDEPENDENT_AMBULATORY_CARE_PROVIDER_SITE_OTHER): Payer: PPO | Admitting: Cardiovascular Disease

## 2022-01-28 ENCOUNTER — Other Ambulatory Visit: Payer: Self-pay

## 2022-01-28 ENCOUNTER — Ambulatory Visit (INDEPENDENT_AMBULATORY_CARE_PROVIDER_SITE_OTHER): Payer: PPO

## 2022-01-28 ENCOUNTER — Encounter (HOSPITAL_BASED_OUTPATIENT_CLINIC_OR_DEPARTMENT_OTHER): Payer: Self-pay | Admitting: Cardiovascular Disease

## 2022-01-28 DIAGNOSIS — I10 Essential (primary) hypertension: Secondary | ICD-10-CM | POA: Diagnosis not present

## 2022-01-28 DIAGNOSIS — I493 Ventricular premature depolarization: Secondary | ICD-10-CM

## 2022-01-28 HISTORY — DX: Ventricular premature depolarization: I49.3

## 2022-01-28 NOTE — Progress Notes (Signed)
Cardiology Office Note:    Date:  01/28/2022   ID:  Joel Webb, DOB July 26, 1946, MRN [id number]  PCP:  Tally Joe, MD   [facility] Providers Cardiologist:  None     Referring MD: Benjiman Core, MD   No chief complaint on file.   History of Present Illness:    Joel Webb is a 76 y.o. male with a hx of hypertension, hyperlipidemia, CKD III, diabetes mellitus, and GERD, here for post-hospital follow-up of near syncope and PVC's. He presented to the ED 11/02/2021 following a near-syncopal episode with prodrome of lightheadedness and general weakness while walking outside. EMS found him hypotensive in the 70s. He felt better after 400 cc of fluid by EMS. His amlodipine and olmesartan had recently been increased 2 weeks and a few days prior.He also had not been eating well that day. He was noted to have frequent PVCs on telemetry. Outpatient follow-up with cardiology was recommended.   He is accompanied by a family member. Today, he is feeling okay overall. When he was at the ED, he was unable to feel any palpitations that were recorded by telemetry. He confirms olmesartan was continued, amlodipine was reduced to 5 mg, and HCTZ was discontinued. At home he monitors his BP daily, mostly in the mornings. It was 137/80 this morning, and has ranged in the 100s-140s, sometimes as high as 160s. On average his blood pressure is usually in the 120s-130s/70s. He usually takes his antihypertensives between 8 AM and 12 PM. He denies any lightheadedness. However, he does endorse poor balance, previous falls, and neuropathy. For exercise he works out about 1-2 times a week. Normally he uses a treadmill or stationary bike. He also does some weight lifting. Concerning his diet he has a mixture of cooking meals at home and ordering out. He denies any chest pain, shortness of breath, or peripheral edema. No headaches, orthopnea, or PND.   Past Medical History:  Diagnosis Date   CKD (chronic  kidney disease), stage III (HCC)    Depression    Diabetes mellitus without complication (HCC)    ED (erectile dysfunction)    GERD (gastroesophageal reflux disease)    Hyperlipidemia    Hypertension    Plantar fasciitis    Prostatitis    PVC's (premature ventricular contractions) 01/28/2022    Past Surgical History:  Procedure Laterality Date   CIRCUMCISION     COLONOSCOPY     MOUTH SURGERY  01/2020    Current Medications: Current Meds  Medication Sig   amLODipine (NORVASC) 5 MG tablet Take 5 mg by mouth daily.   atorvastatin (LIPITOR) 80 MG tablet Take 80 mg by mouth daily.   BD INSULIN SYRINGE U/F 31G X 5/16" 1 ML MISC USE TO GIVE 2 INSULIN INJECTIONS DAILY AS DIRECTED BY YOUR DOCTOR   Blood Glucose Monitoring Suppl (ACCU-CHEK GUIDE) w/Device KIT Use to check blood sugar once a day   DULoxetine (CYMBALTA) 60 MG capsule 60 mg daily.   finasteride (PROSCAR) 5 MG tablet    glucose blood (ACCU-CHEK GUIDE) test strip Use as instructed to check blood sugar once a day dx code E11.65   Insulin Pen Needle 32G X 4 MM MISC Use on insulin pen   metFORMIN (GLUCOPHAGE) 1000 MG tablet Take 1 tablet (1,000 mg total) by mouth 2 (two) times daily with a meal.   Multiple Vitamin (MULTIVITAMIN WITH MINERALS) TABS tablet Take 1 tablet by mouth daily.   NOVOLIN R 100 UNIT/ML injection INJECT 0.08 MLS(8  UNITS TOTAL) INTO THE SKIN TWICE DAILY BEFORE A MEAL; ALSO INJECT 10 UNITS UNDER THE SKIN ONCE DAILY AT SUPPER (Patient taking differently: 6units am, 8-10units lunch, 12 units at supper)   olmesartan (BENICAR) 20 MG tablet TAKE ONE TABLET BY MOUTH DAILY; (REPLACING BENICAR HCT-DOCTOR KUMAR)   Omega-3 Fatty Acids (FISH OIL) 1000 MG CAPS Take by mouth. 2x/day   omeprazole (PRILOSEC) 20 MG capsule Take 20 mg by mouth every other day. PRN   oxybutynin (DITROPAN-XL) 5 MG 24 hr tablet 06/12/20 not started   Semaglutide,0.25 or 0.5MG /DOS, (OZEMPIC, 0.25 OR 0.5 MG/DOSE,) 2 MG/1.5ML SOPN Inject 0.5 mg into the  skin once a week.   tamsulosin (FLOMAX) 0.4 MG CAPS capsule Take 0.4 mg by mouth. Reported on 05/29/2016   TRESIBA FLEXTOUCH 200 UNIT/ML FlexTouch Pen INJECT 40 UNITS UNDER THE SKIN DAILY     Allergies:   Patient has no known allergies.   Social History   Socioeconomic History   Marital status: Married    Spouse name: Aurea Graff   Number of children: 1   Years of education: Not on file   Highest education level: Bachelor's degree (e.g., BA, AB, BS)  Occupational History    Comment: sales rep, retired  Tobacco Use   Smoking status: Former    Types: Cigarettes    Quit date: 12/20/2008    Years since quitting: 13.1   Smokeless tobacco: Never  Substance and Sexual Activity   Alcohol use: Yes    Comment: rarely   Drug use: No   Sexual activity: Not on file  Other Topics Concern   Not on file  Social History Narrative   01/26/20 Lives with wife   Caffeine, none   Social Determinants of Health   Financial Resource Strain: Not on file  Food Insecurity: Not on file  Transportation Needs: Not on file  Physical Activity: Not on file  Stress: Not on file  Social Connections: Not on file     Family History: The patient's family history includes Diabetes in his father; Heart disease in his father; Hepatitis C in his mother; Mesothelioma in his father.  ROS:   Please see the history of present illness.    (+) Imbalance (+) Remote falls (+) Neuropathy All other systems reviewed and are negative.  EKGs/Labs/Other Studies Reviewed:    The following studies were reviewed today:  No prior cardiovascular studies available.   EKG:   EKG is personally reviewed. 01/28/2022: Sinus rhythm. Rate 62 bpm.  Recent Labs: 11/02/2021: Hemoglobin 14.2; Platelets 162 01/08/2022: ALT 18; BUN 37; Creatinine, Ser 1.65; Potassium 4.9; Sodium 140   Recent Lipid Panel    Component Value Date/Time   CHOL 129 12/24/2016 0804   TRIG 210.0 (H) 12/24/2016 0804   HDL 23.70 (L) 12/24/2016 0804    CHOLHDL 5 12/24/2016 0804   VLDL 42.0 (H) 12/24/2016 0804   LDLCALC 97 02/02/2016 0000   LDLDIRECT 107.0 01/08/2022 0813        Physical Exam:    Wt Readings from Last 3 Encounters:  01/28/22 191 lb 4.8 oz (86.8 kg)  01/11/22 189 lb 3.2 oz (85.8 kg)  11/02/21 183 lb (83 kg)     VS:  BP 124/74 (BP Location: Left Arm, Patient Position: Sitting, Cuff Size: Normal)   Pulse 62   Ht 5' 9.5" (1.765 m)   Wt 191 lb 4.8 oz (86.8 kg)   SpO2 98%   BMI 27.85 kg/m  , BMI Body mass index is 27.85 kg/m. GENERAL:  Well appearing HEENT: Pupils equal round and reactive, fundi not visualized, oral mucosa unremarkable NECK:  No jugular venous distention, waveform within normal limits, carotid upstroke brisk and symmetric, no bruits, no thyromegaly LUNGS:  Clear to auscultation bilaterally HEART:  RRR.  PMI not displaced or sustained,S1 and S2 within normal limits, no S3, no S4, no clicks, no rubs, no murmurs ABD:  Flat, positive bowel sounds normal in frequency in pitch, no bruits, no rebound, no guarding, no midline pulsatile mass, no hepatomegaly, no splenomegaly EXT:  2 plus pulses throughout, no edema, no cyanosis no clubbing SKIN:  No rashes no nodules NEURO:  Cranial nerves II through XII grossly intact, motor grossly intact throughout PSYCH:  Cognitively intact, oriented to person place and time   ASSESSMENT:    1. Essential hypertension   2. PVC's (premature ventricular contractions)    PLAN:    Essential hypertension Blood pressure log from home shows that his pressures have been mostly controlled.  Encouraged him to increase his exercise.  We will refer him to the PR EP program to the [facility].  Work on limiting sodium intake and continue to improve his diet.  He is no longer hypotensive since HCTZ was discontinued and amlodipine was reduced.  Continue current doses of amlodipine and olmesartan.  Mixed hyperlipidemia Keep working on diet and exercise as above.  PVC's (premature  ventricular contractions) He was noted to have frequent PVCs in the emergency department.  He did not have any on his EKG today.  I did hear 1 while examining him.  Labs have been unremarkable, including thyroid function.  We will get a 24-hour monitor to better quantify his PVC burden.  He does not have any signs of heart failure.   Disposition: FU with Aaralynn Shepheard C. Duke Salvia, MD, [facility] in 3 months.  Medication Adjustments/Labs and Tests Ordered: Current medicines are reviewed at length with the patient today.  Concerns regarding medicines are outlined above.   Orders Placed This Encounter  Procedures   Amb Referral To Provider Referral Exercise Program (P.R.E.P)   LONG TERM MONITOR (3-14 DAYS)   EKG 12-Lead   No orders of the defined types were placed in this encounter.  Patient Instructions  Medication Instructions:  Your physician recommends that you continue on your current medications as directed. Please refer to the Current Medication list given to you today.   *If you need a refill on your cardiac medications before your next appointment, please call your pharmacy*  Lab Work: NONE  Testing/Procedures: 3 DAY ZIO  THIS WILL BE MAILED TO YOU   Follow-Up: At [facility] you and your health needs are our priority.  As part of our continuing mission to provide you with exceptional heart care, we have created designated Provider Care Teams.  These Care Teams include your primary Cardiologist (physician) and Advanced Practice Providers (APPs -  Physician Assistants and Nurse Practitioners) who all work together to provide you with the care you need, when you need it.  We recommend signing up for the patient portal called "MyChart".  Sign up information is provided on this After Visit Summary.  MyChart is used to connect with patients for Virtual Visits (Telemedicine).  Patients are able to view lab/test results, encounter notes, upcoming appointments, etc.  Non-urgent messages can  be sent to your provider as well.   To learn more about what you can do with MyChart, go to [email or web address].    Your next appointment:   3 month(s)  The format  for your next appointment:   In Person  Provider:   Chilton Si, MD{  Other Instructions ZIO XT- Long Term Monitor Instructions  Your physician has requested you wear a ZIO patch monitor for 3 days.  This is a single patch monitor. [employer] supplies one patch monitor per enrollment. Additional stickers are not available. Please do not apply patch if you will be having a Nuclear Stress Test,  Echocardiogram, Cardiac CT, MRI, or Chest Xray during the period you would be wearing the  monitor. The patch cannot be worn during these tests. You cannot remove and re-apply the  ZIO XT patch monitor.  Your ZIO patch monitor will be mailed 3 day USPS to your address on file. It may take 3-5 days  to receive your monitor after you have been enrolled.  Once you have received your monitor, please review the enclosed instructions. Your monitor  has already been registered assigning a specific monitor serial # to you.  Billing and Patient Assistance Program Information  We have supplied [employer] with any of your insurance information on file for billing purposes. [employer] offers a sliding scale Patient Assistance Program for patients that do not have  insurance, or whose insurance does not completely cover the cost of the ZIO monitor.  You must apply for the Patient Assistance Program to qualify for this discounted rate.  To apply, please call [employer] at [phone], select option 4, select option 2, ask to apply for  Patient Assistance Program. Meredeth Ide will ask your household income, and how many people  are in your household. They will quote your out-of-pocket cost based on that information.  [employer] will also be able to set up a 57-month, interest-free payment plan if needed.  Applying the monitor   Shave hair from upper  left chest.  Hold abrader disc by orange tab. Rub abrader in 40 strokes over the upper left chest as  indicated in your monitor instructions.  Clean area with 4 enclosed alcohol pads. Let dry.  Apply patch as indicated in monitor instructions. Patch will be placed under collarbone on left  side of chest with arrow pointing upward.  Rub patch adhesive wings for 2 minutes. Remove white label marked "1". Remove the white  label marked "2". Rub patch adhesive wings for 2 additional minutes.  While looking in a mirror, press and release button in center of patch. A small green light will  flash 3-4 times. This will be your only indicator that the monitor has been turned on.  Do not shower for the first 24 hours. You may shower after the first 24 hours.  Press the button if you feel a symptom. You will hear a small click. Record Date, Time and  Symptom in the Patient Logbook.  When you are ready to remove the patch, follow instructions on the last 2 pages of Patient  Logbook. Stick patch monitor onto the last page of Patient Logbook.  Place Patient Logbook in the blue and white box. Use locking tab on box and tape box closed  securely. The blue and white box has prepaid postage on it. Please place it in the mailbox as  soon as possible. Your physician should have your test results approximately 7 days after the  monitor has been mailed back to [facility].  Call [facility] Customer Care at [phone] if you have questions regarding  your ZIO XT patch monitor. Call them immediately if you see an orange light blinking on your  monitor.  If  your monitor falls off in less than 4 days, contact our Monitor department at [phone].  If your monitor becomes loose or falls off after 4 days call [employer] at [phone] for  suggestions on securing your monitor    I,Mathew Stumpf,acting as a scribe for Chilton Si, MD.,have documented all relevant documentation on the behalf of Chilton Si, MD,as directed by  Chilton Si, MD while in the presence of Chilton Si, MD.  I, Rayanna Matusik C. Duke Salvia, MD have reviewed all documentation for this visit.  The documentation of the exam, diagnosis, procedures, and orders on 01/28/2022 are all accurate and complete.   Signed, Chilton Si, MD  01/28/2022 3:55 PM    [facility]

## 2022-01-28 NOTE — Progress Notes (Unsigned)
Enrolled for [employer] to mail a ZIO XT long term holter monitor to the patients address on file.  

## 2022-01-28 NOTE — Patient Instructions (Signed)
Medication Instructions:  ?Your physician recommends that you continue on your current medications as directed. Please refer to the Current Medication list given to you today.  ? ?*If you need a refill on your cardiac medications before your next appointment, please call your pharmacy* ? ?Lab Work: ?NONE ? ?Testing/Procedures: ?3 DAY ZIO  ?THIS WILL BE MAILED TO YOU  ? ?Follow-Up: ?At [facility], you and your health needs are our priority.  As part of our continuing mission to provide you with exceptional heart care, we have created designated Provider Care Teams.  These Care Teams include your primary Cardiologist (physician) and Advanced Practice Providers (APPs -  Physician Assistants and Nurse Practitioners) who all work together to provide you with the care you need, when you need it. ? ?We recommend signing up for the patient portal called "MyChart".  Sign up information is provided on this After Visit Summary.  MyChart is used to connect with patients for Virtual Visits (Telemedicine).  Patients are able to view lab/test results, encounter notes, upcoming appointments, etc.  Non-urgent messages can be sent to your provider as well.   ?To learn more about what you can do with MyChart, go to [email or web address].   ? ?Your next appointment:   ?3 month(s) ? ?The format for your next appointment:   ?In Person ? ?Provider:   ?Joel Latch, MD{ ? ?Other Instructions ?ZIO XT- Long Term Monitor Instructions ? ?Your physician has requested you wear a ZIO patch monitor for 3 days.  ?This is a single patch monitor. [employer] supplies one patch monitor per enrollment. Additional ?stickers are not available. Please do not apply patch if you will be having a Nuclear Stress Test,  ?Echocardiogram, Cardiac CT, MRI, or Chest Xray during the period you would be wearing the  ?monitor. The patch cannot be worn during these tests. You cannot remove and re-apply the  ?ZIO XT patch monitor.  ?Your ZIO patch monitor will be  mailed 3 day USPS to your address on file. It may take 3-5 days  ?to receive your monitor after you have been enrolled.  ?Once you have received your monitor, please review the enclosed instructions. Your monitor  ?has already been registered assigning a specific monitor serial # to you. ? ?Billing and Patient Assistance Program Information ? ?We have supplied [employer] with any of your insurance information on file for billing purposes. ?[employer] offers a sliding scale Patient Assistance Program for patients that do not have  ?insurance, or whose insurance does not completely cover the cost of the ZIO monitor.  ?You must apply for the Patient Assistance Program to qualify for this discounted rate.  ?To apply, please call [employer] at [phone], select option 4, select option 2, ask to apply for  ?Patient Assistance Program. Theodore Demark will ask your household income, and how many people  ?are in your household. They will quote your out-of-pocket cost based on that information.  ?[employer] will also be able to set up a 69-month interest-free payment plan if needed. ? ?Applying the monitor ?  ?Shave hair from upper left chest.  ?Hold abrader disc by orange tab. Rub abrader in 40 strokes over the upper left chest as  ?indicated in your monitor instructions.  ?Clean area with 4 enclosed alcohol pads. Let dry.  ?Apply patch as indicated in monitor instructions. Patch will be placed under collarbone on left  ?side of chest with arrow pointing upward.  ?Rub patch adhesive wings for 2 minutes. Remove white label marked "1". Remove  the white  ?label marked "2". Rub patch adhesive wings for 2 additional minutes.  ?While looking in a mirror, press and release button in center of patch. A small green light will  ?flash 3-4 times. This will be your only indicator that the monitor has been turned on.  ?Do not shower for the first 24 hours. You may shower after the first 24 hours.  ?Press the button if you feel a symptom. You will hear  a small click. Record Date, Time and  ?Symptom in the Patient Logbook.  ?When you are ready to remove the patch, follow instructions on the last 2 pages of Patient  ?Logbook. Stick patch monitor onto the last page of Patient Logbook.  ?Place Patient Logbook in the blue and white box. Use locking tab on box and tape box closed  ?securely. The blue and white box has prepaid postage on it. Please place it in the mailbox as  ?soon as possible. Your physician should have your test results approximately 7 days after the  ?monitor has been mailed back to [facility].  ?Call [facility] at [phone] if you have questions regarding  ?your ZIO XT patch monitor. Call them immediately if you see an orange light blinking on your  ?monitor.  ?If your monitor falls off in less than 4 days, contact our Monitor department at [phone].  ?If your monitor becomes loose or falls off after 4 days call [employer] at [phone] for  ?suggestions on securing your monitor ? ?

## 2022-01-28 NOTE — Assessment & Plan Note (Signed)
He was noted to have frequent PVCs in the emergency department.  He did not have any on his EKG today.  I did hear 1 while examining him.  Labs have been unremarkable, including thyroid function.  We will get a 24-hour monitor to better quantify his PVC burden.  He does not have any signs of heart failure. ?

## 2022-01-28 NOTE — Assessment & Plan Note (Signed)
Blood pressure log from home shows that his pressures have been mostly controlled.  Encouraged him to increase his exercise.  We will refer him to the PR EP program to the [facility].  Work on limiting sodium intake and continue to improve his diet.  He is no longer hypotensive since HCTZ was discontinued and amlodipine was reduced.  Continue current doses of amlodipine and olmesartan. ?

## 2022-01-28 NOTE — Assessment & Plan Note (Signed)
Keep working on diet and exercise as above.  ?

## 2022-02-01 ENCOUNTER — Telehealth: Payer: Self-pay

## 2022-02-01 DIAGNOSIS — I493 Ventricular premature depolarization: Secondary | ICD-10-CM | POA: Diagnosis not present

## 2022-02-01 NOTE — Telephone Encounter (Signed)
LVMT requesting call back to discuss PREP referral ? ?

## 2022-02-04 ENCOUNTER — Other Ambulatory Visit: Payer: Self-pay

## 2022-02-04 DIAGNOSIS — E1165 Type 2 diabetes mellitus with hyperglycemia: Secondary | ICD-10-CM

## 2022-02-04 MED ORDER — INSULIN PEN NEEDLE 32G X 4 MM MISC: 1 refills | Status: DC

## 2022-02-05 ENCOUNTER — Encounter: Payer: Self-pay | Admitting: *Deleted

## 2022-02-11 ENCOUNTER — Encounter: Payer: Self-pay | Admitting: Diagnostic Neuroimaging

## 2022-02-11 ENCOUNTER — Ambulatory Visit: Payer: PPO | Admitting: Diagnostic Neuroimaging

## 2022-02-11 ENCOUNTER — Telehealth: Payer: Self-pay | Admitting: Diagnostic Neuroimaging

## 2022-02-11 VITALS — BP 146/89 | HR 71 | Ht 69.0 in | Wt 190.6 lb

## 2022-02-11 DIAGNOSIS — R413 Other amnesia: Secondary | ICD-10-CM

## 2022-02-11 DIAGNOSIS — R269 Unspecified abnormalities of gait and mobility: Secondary | ICD-10-CM | POA: Diagnosis not present

## 2022-02-11 NOTE — Telephone Encounter (Signed)
HTA order sent to GI for the MRI, they will reach out to the patient to schedule. ? ?PET scan sent to centralized scheduling for [facility], they will reach out to the patient to schedule.  ? ?No auth req for either test.  ?

## 2022-02-11 NOTE — Progress Notes (Signed)
? ?[facility] ? ?PATIENT: Joel Webb ?DOB: 1945-12-23 ? ?REFERRING CLINICIAN: Antony Contras, MD ?HISTORY FROM: patient  ?REASON FOR VISIT: follow up ? ? ?HISTORICAL ? ?CHIEF COMPLAINT:  ?Chief Complaint  ?Patient presents with  ? Memory Loss  ?  Rm 7, est patient,  wife [name]  MMSE 29  ? ? ?HISTORY OF PRESENT ILLNESS:  ? ?UPDATE (02/11/22, VRP): Since last visit, memory and balance issues continue. Neuropsych testing in 2022 showed similar issues; not clearly AD or FTD. Also had hypotension in Dec 2022 related to medication changes. More issues veering to the left (walking and driving). ? ?UPDATE (10/16/20, VRP): Since last visit, patient feels stable.  He feels like his memory balance difficulties are stable.  Patient's wife is very concerned about progressive attention and focus difficulty, leaving the stove on, bumping into the wall on the left side, bruising his left arm, zoning out, not paying attention to her conversations.  Continues to have issues with marital discord. ? ?UPDATE (06/12/20, VRP): Since last visit, doing well. Symptoms are improved. Severity is mild. No alleviating or aggravating factors. Tolerating meds. Memory and gait improved. Now exercising at [facility] 4x per week and feeling stronger.  ? ?UPDATE (02/28/20, Dr. Nicole Kindred): "I met with Joel Webb to review the findings resulting from his neuropsychological evaluation. Since the last appointment, he has been about the same. Time was spent reviewing the impressions and recommendations that are detailed in the evaluation report. I explained that int he present case, we do not have enough for a diagnosis of frank cognitive impairment and that I think depression, relational issues, and other more common reversible factors are the likely causes of Mr. Mulvey difficulties. I counseled them about things to watch out for in FTD and was candid about the fact that the condition is often misdiagnosed as depression or  relationship distress. We now have a strong baseline against which to compare Mr. Dicostanzo future performance. Interventions provided during this encounter included psychoeducation, and other topics as reflected in the patient instructions. I took time to explain the findings and answer all the patient's questions. I encouraged Mr. Hardage to contact me should he have any further questions or if further follow up is desired." ? ?UPDATE (01/26/20, VRP): Since last visit, doing about the same. Symptoms are persistent. Severity is moderate per patient. No alleviating or aggravating factors. Wife still notes memory and personality changes. ? ?PRIOR HPI (12/22/19): 76 year old male here for evaluation of gait and balance difficulty.  History of diabetes, hypercholesterolemia, high blood pressure.  For past 6 to 8 months patient has had sensation of stumbling, tripping, numbness in feet, left foot incoordination.  Symptoms worsening over time.  He has more difficulty with steps and uneven surfaces.  He notices problem in his left foot.  Has had some back pain in the past. ? ?Wife also notes at least 1 year of memory loss, personality and mood changes, irritable, paranoid, forgetting anniversary dates, forgetting medications and appointments, leaving stove on. ? ?Patient and wife had COVID in Jan 2021.  ? ? ?REVIEW OF SYSTEMS: Full 14 system review of systems performed and negative with exception of: As per HPI. ? ?ALLERGIES: ?No Known Allergies ? ?HOME MEDICATIONS: ?Outpatient Medications Prior to Visit  ?Medication Sig Dispense Refill  ? amLODipine (NORVASC) 5 MG tablet Take 5 mg by mouth daily.    ? atorvastatin (LIPITOR) 80 MG tablet Take 80 mg by mouth daily.    ? BD INSULIN SYRINGE  U/F 31G X 5/16" 1 ML MISC USE TO GIVE 2 INSULIN INJECTIONS DAILY AS DIRECTED BY YOUR DOCTOR 200 each 5  ? Blood Glucose Monitoring Suppl (ACCU-CHEK GUIDE) w/Device KIT Use to check blood sugar once a day 1 kit 0  ? DULoxetine (CYMBALTA) 60  MG capsule 60 mg daily.    ? finasteride (PROSCAR) 5 MG tablet     ? glucose blood (ACCU-CHEK GUIDE) test strip Use as instructed to check blood sugar once a day dx code E11.65 100 each 2  ? Insulin Pen Needle 32G X 4 MM MISC Use on insulin pen 90 each 1  ? metFORMIN (GLUCOPHAGE) 1000 MG tablet Take 1 tablet (1,000 mg total) by mouth 2 (two) times daily with a meal. 180 tablet 2  ? Multiple Vitamin (MULTIVITAMIN WITH MINERALS) TABS tablet Take 1 tablet by mouth daily.    ? NOVOLIN R 100 UNIT/ML injection INJECT 0.08 MLS(8 UNITS TOTAL) INTO THE SKIN TWICE DAILY BEFORE A MEAL; ALSO INJECT 10 UNITS UNDER THE SKIN ONCE DAILY AT SUPPER (Patient taking differently: 6units am, 8-10units lunch, 12 units at supper) 10 mL 3  ? olmesartan (BENICAR) 20 MG tablet TAKE ONE TABLET BY MOUTH DAILY; (REPLACING BENICAR HCT-DOCTOR KUMAR) 90 tablet 1  ? Omega-3 Fatty Acids (FISH OIL) 1000 MG CAPS Take by mouth. 2x/day    ? omeprazole (PRILOSEC) 20 MG capsule Take 20 mg by mouth every other day. PRN    ? Semaglutide,0.25 or 0.5MG/DOS, (OZEMPIC, 0.25 OR 0.5 MG/DOSE,) 2 MG/1.5ML SOPN Inject 0.5 mg into the skin once a week. 1.5 mL 1  ? tamsulosin (FLOMAX) 0.4 MG CAPS capsule Take 0.4 mg by mouth. Reported on 05/29/2016    ? TRESIBA FLEXTOUCH 200 UNIT/ML FlexTouch Pen INJECT 40 UNITS UNDER THE SKIN DAILY 18 mL 2  ? oxybutynin (DITROPAN-XL) 5 MG 24 hr tablet 06/12/20 not started (Patient not taking: Reported on 02/11/2022)    ? ?No facility-administered medications prior to visit.  ? ? ?PAST MEDICAL HISTORY: ?Past Medical History:  ?Diagnosis Date  ? CKD (chronic kidney disease), stage III ([place])   ? Depression   ? Diabetes mellitus without complication ([place])   ? ED (erectile dysfunction)   ? GERD (gastroesophageal reflux disease)   ? Hyperlipidemia   ? Hypertension   ? MCI (mild cognitive impairment)   ? Plantar fasciitis   ? Prostatitis   ? PVC's (premature ventricular contractions) 01/28/2022  ? ? ?PAST SURGICAL HISTORY: ?Past Surgical  History:  ?Procedure Laterality Date  ? CIRCUMCISION    ? COLONOSCOPY    ? MOUTH SURGERY  01/2020  ? ? ?FAMILY HISTORY: ?Family History  ?Problem Relation Age of Onset  ? Hepatitis C Mother   ? Mesothelioma Father   ? Diabetes Father   ? Heart disease Father   ? ? ?SOCIAL HISTORY: ?Social History  ? ?Socioeconomic History  ? Marital status: Married  ?  Spouse name: [name]  ? Number of children: 1  ? Years of education: Not on file  ? Highest education level: Bachelor's degree (e.g., BA, AB, BS)  ?Occupational History  ?  Comment: sales rep, retired  ?Tobacco Use  ? Smoking status: Former  ?  Types: Cigarettes  ?  Quit date: 12/20/2008  ?  Years since quitting: 13.1  ? Smokeless tobacco: Never  ?Substance and Sexual Activity  ? Alcohol use: Yes  ?  Comment: rarely  ? Drug use: No  ? Sexual activity: Not on file  ?Other Topics Concern  ?  Not on file  ?Social History Narrative  ? 01/26/20 Lives with wife  ? Caffeine, none  ? ?Social Determinants of Health  ? ?Financial Resource Strain: Not on file  ?Food Insecurity: Not on file  ?Transportation Needs: Not on file  ?Physical Activity: Not on file  ?Stress: Not on file  ?Social Connections: Not on file  ?Intimate Partner Violence: Not on file  ? ? ? ?PHYSICAL EXAM ? ?GENERAL EXAM/CONSTITUTIONAL: ?Vitals:  ?Vitals:  ? 02/11/22 1027  ?BP: (!) 146/89  ?Pulse: 71  ?Weight: 190 lb 9.6 oz (86.5 kg)  ?Height: _0  (1.753 m)  ? ?Body mass index is 28.15 kg/m?. ?Wt Readings from Last 3 Encounters:  ?02/11/22 190 lb 9.6 oz (86.5 kg)  ?01/28/22 191 lb 4.8 oz (86.8 kg)  ?01/11/22 189 lb 3.2 oz (85.8 kg)  ? ?Patient is in no distress; well developed, nourished and groomed; neck is supple ? ?CARDIOVASCULAR: ?Examination of carotid arteries is normal; no carotid bruits ?Regular rate and rhythm, no murmurs ?Examination of peripheral vascular system by observation and palpation is normal ? ?EYES: ?Ophthalmoscopic exam of optic discs and posterior segments is normal; no papilledema or  hemorrhages ?No results found. ? ?MUSCULOSKELETAL: ?Gait, strength, tone, movements noted in Neurologic exam below ? ?NEUROLOGIC: ?MENTAL STATUS:  ? ?  02/11/2022  ? 10:38 AM 10/16/2020  ?  4:21 PM 01/26/2020  ? 12:49 PM  ?

## 2022-02-12 ENCOUNTER — Telehealth: Payer: Self-pay | Admitting: Diagnostic Neuroimaging

## 2022-02-12 NOTE — Telephone Encounter (Signed)
At 4:31 p.m. pt's wife left a vm on yesterday asking to be called re: her being in need of a support group  for family members of those with a diagnosis of Dementia or Alzheimer's, please call.  ?

## 2022-02-12 NOTE — Telephone Encounter (Signed)
Called wife and informed her I will mail her a packet of information with resources she will find helpful. She verbalized understanding, appreciation. ?Dementia packet put in mail.  ?

## 2022-02-15 ENCOUNTER — Telehealth: Payer: Self-pay

## 2022-02-15 DIAGNOSIS — I493 Ventricular premature depolarization: Secondary | ICD-10-CM | POA: Diagnosis not present

## 2022-02-15 NOTE — Telephone Encounter (Signed)
VM Call from pt, wants to start PREP now instead of waiting for dental surgery ?Returned call to patient. Confirmed he would like to start PREP  ?Equidistant between [facility] ?Next [facility] class will be on 4/17 M/W 1pm-215pm x 12 weeks ?Will call him about a week before the start of class to do intake and measurements  ? ?

## 2022-02-19 ENCOUNTER — Ambulatory Visit (HOSPITAL_COMMUNITY): Admission: RE | Admit: 2022-02-19 | Payer: PPO | Source: Ambulatory Visit

## 2022-02-19 ENCOUNTER — Other Ambulatory Visit (HOSPITAL_COMMUNITY): Payer: Self-pay

## 2022-02-20 ENCOUNTER — Other Ambulatory Visit: Payer: Self-pay | Admitting: Endocrinology

## 2022-02-22 ENCOUNTER — Telehealth: Payer: Self-pay

## 2022-02-22 NOTE — Telephone Encounter (Signed)
Call to pt reference scheduling intake for PREP class starting on 4/17 at [facility] ?Scheduled for 1pm on April 12th  ?Will meet pt in lobby  ?

## 2022-02-23 ENCOUNTER — Ambulatory Visit (HOSPITAL_COMMUNITY)
Admission: RE | Admit: 2022-02-23 | Discharge: 2022-02-23 | Disposition: A | Discharge status: Home / Self Care | Payer: PPO | Source: Ambulatory Visit | Attending: Diagnostic Neuroimaging | Admitting: Diagnostic Neuroimaging

## 2022-02-23 DIAGNOSIS — R269 Unspecified abnormalities of gait and mobility: Secondary | ICD-10-CM

## 2022-02-23 DIAGNOSIS — R413 Other amnesia: Secondary | ICD-10-CM | POA: Diagnosis not present

## 2022-02-23 MED ORDER — GADOBUTROL 1 MMOL/ML IV SOLN
9.0000 mL | Freq: Once | INTRAVENOUS | Status: AC | PRN
  Administered 2022-02-23: 9 mL via INTRAVENOUS

## 2022-02-25 ENCOUNTER — Inpatient Hospital Stay (HOSPITAL_COMMUNITY): Admission: RE | Admit: 2022-02-25 | Payer: PPO | Source: Ambulatory Visit

## 2022-02-26 ENCOUNTER — Telehealth: Payer: Self-pay

## 2022-02-26 NOTE — Progress Notes (Signed)
MRI shows stable volume loss of the brain. One part of the brain (midbrain) is smaller than expected which can be seen in certain types of dementia. The scheduled PET scan should help differentiate what type of dementia he may have. ? ?MRI also shows a small stroke which was not present on his MRI in 2021. It does not look recent but must have occurred sometime between 2021 and now. I'll defer any further workup to Dr Leta Baptist when he gets back

## 2022-02-26 NOTE — Telephone Encounter (Signed)
-----   Message from Genia Harold, MD sent at 02/26/2022  9:14 AM EDT ----- ?MRI shows stable volume loss of the brain. One part of the brain (midbrain) is smaller than expected which can be seen in certain types of dementia. The scheduled PET scan should help differentiate what type of dementia he may have. ? ?MRI also shows a small stroke which was not present on his MRI in 2021. It does not look recent but must have occurred sometime between 2021 and now. I'll defer any further workup to Dr Leta Baptist when he gets back ?

## 2022-02-26 NOTE — Telephone Encounter (Signed)
I called pt. No answer, left a message asking pt to call me back.   

## 2022-02-28 ENCOUNTER — Encounter: Payer: Self-pay | Admitting: Diagnostic Neuroimaging

## 2022-03-01 NOTE — Progress Notes (Signed)
[facility] PREP Evaluation ? ?Patient Details  ?Name: Joel Webb ?MRN: [id number] ?Date of Birth: July 06, 1946 ?Age: 76 y.o. ?PCP: Antony Contras, MD ? ?Vitals:  ? 03/01/22 1023  ?BP: 128/80  ?Pulse: (!) 57  ?SpO2: 99%  ?Weight: 189 lb 6.4 oz (85.9 kg)  ? ? ? [facility] Eval - 03/01/22 1000   ? ?  ? [facility]PREP[facility] Location  ? [facility]PREP[facility] Location [place]   ?  ? Referral   ? Referring Provider Oval Linsey   ? Reason for referral Hypertension;Diabetes   ? Program Start Date 03/11/22   M/W 1pm-215pm x 12 wks  ?  ? Measurement  ? Waist Circumference 42 inches   ? Hip Circumference 40 inches   ? Body fat --   will calculate later  ?  ? Information for Trainer  ? Goals get stronger   ? Current Exercise walks for 30 min and uses some strength machines   ? Orthopedic Concerns Right knee pain when squatting and climbing stairs   ? Pertinent Medical History IDDM2, HTN, stroke   ? Current Barriers none   ? Restrictions/Precautions Diabetic snack before exercise   ? Medications that affect exercise Medication causing dizziness/drowsiness   ?  ? Timed Up and Go (TUGS)  ? Timed Up and Go Moderate risk 10-12 seconds   Fell < 6 months ago  ?  ? Mobility and Daily Activities  ? I find it easy to walk up or down two or more flights of stairs. 2   ? I have no trouble taking out the trash. 4   ? I do housework such as vacuuming and dusting on my own without difficulty. 4   ? I can easily lift a gallon of milk (8lbs). 4   ? I can easily walk a mile. 4   ? I have no trouble reaching into high cupboards or reaching down to pick up something from the floor. 4   ? I do not have trouble doing out-door work such as Armed forces logistics/support/administrative officer, raking leaves, or gardening. 2   ?  ? Mobility and Daily Activities  ? I feel younger than my age. 2   ? I feel independent. 4   ? I feel energetic. 2   ? I live an active life.  2   ? I feel strong. 1   ? I feel healthy. 3   ? I feel active as other people my age. 2   ?  ? How fit and strong are you.  ? Fit and Strong  Total Score 40   ? ?  ?  ? ?  ? ?Past Medical History:  ?Diagnosis Date  ? CKD (chronic kidney disease), stage III ([place])   ? Depression   ? Diabetes mellitus without complication ([place])   ? ED (erectile dysfunction)   ? GERD (gastroesophageal reflux disease)   ? Hyperlipidemia   ? Hypertension   ? MCI (mild cognitive impairment)   ? Plantar fasciitis   ? Prostatitis   ? PVC's (premature ventricular contractions) 01/28/2022  ? ?Past Surgical History:  ?Procedure Laterality Date  ? CIRCUMCISION    ? COLONOSCOPY    ? MOUTH SURGERY  01/2020  ? ?Social History  ? ?Tobacco Use  ?Smoking Status Former  ? Types: Cigarettes  ? Quit date: 12/20/2008  ? Years since quitting: 13.2  ?Smokeless Tobacco Never  ? ? ?[name] ?03/01/2022, 10:28 AM ? ? ?

## 2022-03-04 ENCOUNTER — Ambulatory Visit (HOSPITAL_COMMUNITY)
Admission: RE | Admit: 2022-03-04 | Discharge: 2022-03-04 | Disposition: A | Discharge status: Home / Self Care | Payer: PPO | Source: Ambulatory Visit | Attending: Diagnostic Neuroimaging | Admitting: Diagnostic Neuroimaging

## 2022-03-04 DIAGNOSIS — R269 Unspecified abnormalities of gait and mobility: Secondary | ICD-10-CM | POA: Diagnosis not present

## 2022-03-04 DIAGNOSIS — F0390 Unspecified dementia without behavioral disturbance: Secondary | ICD-10-CM | POA: Diagnosis not present

## 2022-03-04 DIAGNOSIS — R413 Other amnesia: Secondary | ICD-10-CM | POA: Insufficient documentation

## 2022-03-04 LAB — GLUCOSE, CAPILLARY: Glucose-Capillary: 81 mg/dL (ref 70–99)

## 2022-03-04 MED ORDER — FLUDEOXYGLUCOSE F - 18 (FDG) INJECTION
10.0000 | Freq: Once | INTRAVENOUS | Status: AC
  Administered 2022-03-04: 9.98 via INTRAVENOUS

## 2022-03-07 ENCOUNTER — Other Ambulatory Visit (INDEPENDENT_AMBULATORY_CARE_PROVIDER_SITE_OTHER): Payer: PPO

## 2022-03-07 DIAGNOSIS — Z794 Long term (current) use of insulin: Secondary | ICD-10-CM

## 2022-03-07 DIAGNOSIS — E1165 Type 2 diabetes mellitus with hyperglycemia: Secondary | ICD-10-CM

## 2022-03-07 LAB — BASIC METABOLIC PANEL
BUN: 31 mg/dL — ABNORMAL HIGH (ref 6–23)
CO2: 27 mEq/L (ref 19–32)
Calcium: 9.1 mg/dL (ref 8.4–10.5)
Chloride: 103 mEq/L (ref 96–112)
Creatinine, Ser: 1.48 mg/dL (ref 0.40–1.50)
GFR: 46.03 mL/min — ABNORMAL LOW (ref >60.00)
Glucose, Bld: 142 mg/dL — ABNORMAL HIGH (ref 70–99)
Potassium: 4.4 mEq/L (ref 3.5–5.1)
Sodium: 139 mEq/L (ref 135–145)

## 2022-03-08 LAB — FRUCTOSAMINE: Fructosamine: 275 umol/L (ref 0–285)

## 2022-03-11 ENCOUNTER — Telehealth: Payer: PPO | Admitting: Diagnostic Neuroimaging

## 2022-03-11 NOTE — Progress Notes (Signed)
[facility] PREP Weekly Session ? ?Patient Details  ?Name: Joel Webb ?MRN: [id number] ?Date of Birth: 1946-08-03 ?Age: 76 y.o. ?PCP: Antony Contras, MD ? ?There were no vitals filed for this visit. ? ? [facility] Weekly seesion - 03/11/22 1600   ? ?  ? [facility] "PREP" Location  ? [facility] "PREP[facility] Location [place]   ?  ? Weekly Session  ? Topic Discussed Goal setting and welcome to the program   scale of perceived exertion and tour of facility  ? Classes attended to date 1   ? ?  ?  ? ?  ?First PREP class today. Asked class to stand after 30 min of sitting and pt rose quickly and lost balance, looked to have landed on right knee then left elbow, landing on left side.  ?Denied injury.  ?Practice rising from seat with intention and not rising quickly.  ?Called and lvmt pt to check on him will await call back. Will try again tomorrow to check for injuries and get status.  ? ?[name] ?03/11/2022, 4:08 PM ? ? ?

## 2022-03-12 ENCOUNTER — Telehealth: Payer: PPO | Admitting: Diagnostic Neuroimaging

## 2022-03-12 ENCOUNTER — Telehealth (INDEPENDENT_AMBULATORY_CARE_PROVIDER_SITE_OTHER): Payer: PPO | Admitting: Diagnostic Neuroimaging

## 2022-03-12 DIAGNOSIS — R269 Unspecified abnormalities of gait and mobility: Secondary | ICD-10-CM

## 2022-03-12 NOTE — Progress Notes (Signed)
? ?[facility] ? ?PATIENT: Joel Webb ?DOB: 1946/08/12 ? ?REFERRING CLINICIAN: Antony Contras, MD ?HISTORY FROM: patient  ?REASON FOR VISIT: follow up ? ? ?HISTORICAL ? ?CHIEF COMPLAINT:  ?Chief Complaint  ?Patient presents with  ? Memory Loss  ? Gait Problem  ? ? ?HISTORY OF PRESENT ILLNESS:  ? ?UPDATE (03/12/22, VRP): Since last visit, doing about the same. Symptoms are mainly balance issues, mood changes, memory changes, mainly noted per wife.  ? ?UPDATE (02/11/22, VRP): Since last visit, memory and balance issues continue. Neuropsych testing in 2022 showed similar issues; not clearly AD or FTD. Also had hypotension in Dec 2022 related to medication changes. More issues veering to the left (walking and driving). ? ?UPDATE (10/16/20, VRP): Since last visit, patient feels stable.  He feels like his memory balance difficulties are stable.  Patient's wife is very concerned about progressive attention and focus difficulty, leaving the stove on, bumping into the wall on the left side, bruising his left arm, zoning out, not paying attention to her conversations.  Continues to have issues with marital discord. ? ?UPDATE (06/12/20, VRP): Since last visit, doing well. Symptoms are improved. Severity is mild. No alleviating or aggravating factors. Tolerating meds. Memory and gait improved. Now exercising at [facility] 4x per week and feeling stronger.  ? ?UPDATE (02/28/20, Dr. Nicole Kindred): "I met with Joel Webb to review the findings resulting from his neuropsychological evaluation. Since the last appointment, he has been about the same. Time was spent reviewing the impressions and recommendations that are detailed in the evaluation report. I explained that int he present case, we do not have enough for a diagnosis of frank cognitive impairment and that I think depression, relational issues, and other more common reversible factors are the likely causes of Mr. Pro difficulties. I counseled them  about things to watch out for in FTD and was candid about the fact that the condition is often misdiagnosed as depression or relationship distress. We now have a strong baseline against which to compare Mr. Radloff future performance. Interventions provided during this encounter included psychoeducation, and other topics as reflected in the patient instructions. I took time to explain the findings and answer all the patient's questions. I encouraged Mr. Coby to contact me should he have any further questions or if further follow up is desired." ? ?UPDATE (01/26/20, VRP): Since last visit, doing about the same. Symptoms are persistent. Severity is moderate per patient. No alleviating or aggravating factors. Wife still notes memory and personality changes. ? ?PRIOR HPI (12/22/19): 76 year old male here for evaluation of gait and balance difficulty.  History of diabetes, hypercholesterolemia, high blood pressure.  For past 6 to 8 months patient has had sensation of stumbling, tripping, numbness in feet, left foot incoordination.  Symptoms worsening over time.  He has more difficulty with steps and uneven surfaces.  He notices problem in his left foot.  Has had some back pain in the past. ? ?Wife also notes at least 1 year of memory loss, personality and mood changes, irritable, paranoid, forgetting anniversary dates, forgetting medications and appointments, leaving stove on. ? ?Patient and wife had COVID in Jan 2021.  ? ? ?REVIEW OF SYSTEMS: Full 14 system review of systems performed and negative with exception of: As per HPI. ? ?ALLERGIES: ?No Known Allergies ? ?HOME MEDICATIONS: ?Outpatient Medications Prior to Visit  ?Medication Sig Dispense Refill  ? amLODipine (NORVASC) 5 MG tablet Take 5 mg by mouth daily.    ? atorvastatin (LIPITOR)  80 MG tablet Take 80 mg by mouth daily.    ? BD INSULIN SYRINGE U/F 31G X 5/16" 1 ML MISC USE TO GIVE 2 INSULIN INJECTIONS DAILY AS DIRECTED BY YOUR DOCTOR 200 each 5  ? Blood  Glucose Monitoring Suppl (ACCU-CHEK GUIDE) w/Device KIT Use to check blood sugar once a day 1 kit 0  ? DULoxetine (CYMBALTA) 60 MG capsule 60 mg daily.    ? finasteride (PROSCAR) 5 MG tablet     ? glucose blood (ACCU-CHEK GUIDE) test strip Use as instructed to check blood sugar once a day dx code E11.65 100 each 2  ? Insulin Pen Needle 32G X 4 MM MISC Use on insulin pen 90 each 1  ? metFORMIN (GLUCOPHAGE) 1000 MG tablet Take 1 tablet (1,000 mg total) by mouth 2 (two) times daily with a meal. 180 tablet 2  ? Multiple Vitamin (MULTIVITAMIN WITH MINERALS) TABS tablet Take 1 tablet by mouth daily.    ? NOVOLIN R 100 UNIT/ML injection INJECT 0.08 MLS(8 UNITS TOTAL) INTO THE SKIN TWICE DAILY BEFORE A MEAL; ALSO INJECT 10 UNITS UNDER THE SKIN ONCE DAILY AT SUPPER (Patient taking differently: 6units am, 8-10units lunch, 12 units at supper) 10 mL 3  ? olmesartan (BENICAR) 20 MG tablet TAKE ONE TABLET BY MOUTH DAILY; (REPLACING BENICAR HCT-DOCTOR KUMAR) 90 tablet 1  ? Omega-3 Fatty Acids (FISH OIL) 1000 MG CAPS Take by mouth. 2x/day    ? omeprazole (PRILOSEC) 20 MG capsule Take 20 mg by mouth every other day. PRN    ? oxybutynin (DITROPAN-XL) 5 MG 24 hr tablet 06/12/20 not started (Patient not taking: Reported on 02/11/2022)    ? OZEMPIC, 0.25 OR 0.5 MG/DOSE, 2 MG/3ML SOPN DIAL AND INJECT UNDER THE SKIN 0.5 MG WEEKLY 3 mL 0  ? tamsulosin (FLOMAX) 0.4 MG CAPS capsule Take 0.4 mg by mouth. Reported on 05/29/2016    ? TRESIBA FLEXTOUCH 200 UNIT/ML FlexTouch Pen INJECT 40 UNITS UNDER THE SKIN DAILY 18 mL 2  ? ?No facility-administered medications prior to visit.  ? ? ?PAST MEDICAL HISTORY: ?Past Medical History:  ?Diagnosis Date  ? CKD (chronic kidney disease), stage III ([place])   ? Depression   ? Diabetes mellitus without complication ([place])   ? ED (erectile dysfunction)   ? GERD (gastroesophageal reflux disease)   ? Hyperlipidemia   ? Hypertension   ? MCI (mild cognitive impairment)   ? Plantar fasciitis   ? Prostatitis   ? PVC's  (premature ventricular contractions) 01/28/2022  ? ? ?PAST SURGICAL HISTORY: ?Past Surgical History:  ?Procedure Laterality Date  ? CIRCUMCISION    ? COLONOSCOPY    ? MOUTH SURGERY  01/2020  ? ? ?FAMILY HISTORY: ?Family History  ?Problem Relation Age of Onset  ? Hepatitis C Mother   ? Mesothelioma Father   ? Diabetes Father   ? Heart disease Father   ? ? ?SOCIAL HISTORY: ?Social History  ? ?Socioeconomic History  ? Marital status: Married  ?  Spouse name: [name]  ? Number of children: 1  ? Years of education: Not on file  ? Highest education level: Bachelor's degree (e.g., BA, AB, BS)  ?Occupational History  ?  Comment: sales rep, retired  ?Tobacco Use  ? Smoking status: Former  ?  Types: Cigarettes  ?  Quit date: 12/20/2008  ?  Years since quitting: 13.2  ? Smokeless tobacco: Never  ?Substance and Sexual Activity  ? Alcohol use: Yes  ?  Comment: rarely  ? Drug use:  No  ? Sexual activity: Not on file  ?Other Topics Concern  ? Not on file  ?Social History Narrative  ? 01/26/20 Lives with wife  ? Caffeine, none  ? ?Social Determinants of Health  ? ?Financial Resource Strain: Not on file  ?Food Insecurity: Not on file  ?Transportation Needs: Not on file  ?Physical Activity: Not on file  ?Stress: Not on file  ?Social Connections: Not on file  ?Intimate Partner Violence: Not on file  ? ? ? ?PHYSICAL EXAM ? ?Video visit ? ? ? ?DIAGNOSTIC DATA (LABS, IMAGING, TESTING) ?- I reviewed patient records, labs, notes, testing and imaging myself where available. ? ?Lab Results  ?Component Value Date  ? WBC 10.0 11/02/2021  ? HGB 14.2 11/02/2021  ? HCT 41.8 11/02/2021  ? MCV 87.4 11/02/2021  ? PLT 162 11/02/2021  ? ?   ?Component Value Date/Time  ? NA 139 03/07/2022 0849  ? NA 137 02/02/2016 0000  ? K 4.4 03/07/2022 0849  ? CL 103 03/07/2022 0849  ? CO2 27 03/07/2022 0849  ? GLUCOSE 142 (H) 03/07/2022 0849  ? BUN 31 (H) 03/07/2022 0849  ? BUN 33 (A) 02/02/2016 0000  ? CREATININE 1.48 03/07/2022 0849  ? CALCIUM 9.1 03/07/2022 0849  ?  PROT 6.6 01/08/2022 0813  ? ALBUMIN 4.1 01/08/2022 0813  ? AST 18 01/08/2022 0813  ? ALT 18 01/08/2022 0813  ? ALKPHOS 97 01/08/2022 0813  ? BILITOT 0.5 01/08/2022 0813  ? GFRNONAA 51 (L) 11/02/2021 1829  ? ?Lab Re

## 2022-03-13 ENCOUNTER — Encounter: Payer: Self-pay | Admitting: Endocrinology

## 2022-03-13 ENCOUNTER — Ambulatory Visit: Payer: PPO | Admitting: Endocrinology

## 2022-03-13 ENCOUNTER — Encounter (HOSPITAL_COMMUNITY): Payer: Self-pay

## 2022-03-13 ENCOUNTER — Other Ambulatory Visit: Payer: Self-pay

## 2022-03-13 ENCOUNTER — Encounter: Payer: Self-pay | Admitting: Diagnostic Neuroimaging

## 2022-03-13 ENCOUNTER — Emergency Department (HOSPITAL_COMMUNITY): Payer: PPO

## 2022-03-13 ENCOUNTER — Emergency Department (HOSPITAL_COMMUNITY)
Admission: EM | Admit: 2022-03-13 | Discharge: 2022-03-13 | Discharge status: Against Medical Advice | Payer: PPO | Attending: Emergency Medicine | Admitting: Emergency Medicine

## 2022-03-13 VITALS — BP 95/68 | HR 82 | Ht 69.0 in | Wt 189.0 lb

## 2022-03-13 DIAGNOSIS — R809 Proteinuria, unspecified: Secondary | ICD-10-CM

## 2022-03-13 DIAGNOSIS — R42 Dizziness and giddiness: Secondary | ICD-10-CM | POA: Diagnosis not present

## 2022-03-13 DIAGNOSIS — R531 Weakness: Secondary | ICD-10-CM | POA: Diagnosis not present

## 2022-03-13 DIAGNOSIS — E1165 Type 2 diabetes mellitus with hyperglycemia: Secondary | ICD-10-CM

## 2022-03-13 DIAGNOSIS — Z794 Long term (current) use of insulin: Secondary | ICD-10-CM | POA: Diagnosis not present

## 2022-03-13 DIAGNOSIS — Z5321 Procedure and treatment not carried out due to patient leaving prior to being seen by health care provider: Secondary | ICD-10-CM | POA: Insufficient documentation

## 2022-03-13 DIAGNOSIS — I951 Orthostatic hypotension: Secondary | ICD-10-CM | POA: Insufficient documentation

## 2022-03-13 DIAGNOSIS — E1129 Type 2 diabetes mellitus with other diabetic kidney complication: Secondary | ICD-10-CM

## 2022-03-13 DIAGNOSIS — I952 Hypotension due to drugs: Secondary | ICD-10-CM

## 2022-03-13 DIAGNOSIS — R29818 Other symptoms and signs involving the nervous system: Secondary | ICD-10-CM | POA: Diagnosis not present

## 2022-03-13 DIAGNOSIS — I6782 Cerebral ischemia: Secondary | ICD-10-CM | POA: Diagnosis not present

## 2022-03-13 DIAGNOSIS — G319 Degenerative disease of nervous system, unspecified: Secondary | ICD-10-CM | POA: Diagnosis not present

## 2022-03-13 LAB — CBC WITH DIFFERENTIAL/PLATELET
Abs Immature Granulocytes: 0.07 10*3/uL (ref 0.00–0.07)
Basophils Absolute: 0 10*3/uL (ref 0.0–0.1)
Basophils Relative: 0 %
Eosinophils Absolute: 0.1 10*3/uL (ref 0.0–0.5)
Eosinophils Relative: 1 %
HCT: 40.5 % (ref 39.0–52.0)
Hemoglobin: 13.6 g/dL (ref 13.0–17.0)
Immature Granulocytes: 1 %
Lymphocytes Relative: 14 %
Lymphs Abs: 1.2 10*3/uL (ref 0.7–4.0)
MCH: 30.4 pg (ref 26.0–34.0)
MCHC: 33.6 g/dL (ref 30.0–36.0)
MCV: 90.4 fL (ref 80.0–100.0)
Monocytes Absolute: 0.7 10*3/uL (ref 0.1–1.0)
Monocytes Relative: 9 %
Neutro Abs: 6.3 10*3/uL (ref 1.7–7.7)
Neutrophils Relative %: 75 %
Platelets: 145 10*3/uL — ABNORMAL LOW (ref 150–400)
RBC: 4.48 MIL/uL (ref 4.22–5.81)
RDW: 13.3 % (ref 11.5–15.5)
WBC: 8.4 10*3/uL (ref 4.0–10.5)
nRBC: 0 % (ref 0.0–0.2)

## 2022-03-13 LAB — COMPREHENSIVE METABOLIC PANEL
ALT: 26 U/L (ref 0–44)
AST: 25 U/L (ref 15–41)
Albumin: 3.7 g/dL (ref 3.5–5.0)
Alkaline Phosphatase: 93 U/L (ref 38–126)
Anion gap: 12 (ref 5–15)
BUN: 37 mg/dL — ABNORMAL HIGH (ref 8–23)
CO2: 20 mmol/L — ABNORMAL LOW (ref 22–32)
Calcium: 8.9 mg/dL (ref 8.9–10.3)
Chloride: 103 mmol/L (ref 98–111)
Creatinine, Ser: 1.82 mg/dL — ABNORMAL HIGH (ref 0.61–1.24)
GFR, Estimated: 38 mL/min — ABNORMAL LOW (ref >60)
Glucose, Bld: 141 mg/dL — ABNORMAL HIGH (ref 70–99)
Potassium: 4.3 mmol/L (ref 3.5–5.1)
Sodium: 135 mmol/L (ref 135–145)
Total Bilirubin: 0.7 mg/dL (ref 0.3–1.2)
Total Protein: 6.7 g/dL (ref 6.5–8.1)

## 2022-03-13 LAB — TROPONIN I (HIGH SENSITIVITY): Troponin I (High Sensitivity): 9 ng/L (ref <18)

## 2022-03-13 NOTE — Progress Notes (Signed)
Time of class: 110pm start end 210pm ?Fit testing: 120pm to 145pm ?Held balance testing  ? ?

## 2022-03-13 NOTE — Progress Notes (Signed)
2nd PREP class today ?Noticed unsteadiness with march test and odd gait, would not always lift left foot when marching. Tends to lean left-see Monday's note reference fall. Did not complete march test.  ?Unable to do sit to stand without loss of balance.  ?Was able to do bilateral bicep curls without issue.  ?Talked with pt/wife about BP-difficult historian reference frequency of meds taken and how often. Sts BP goes up and own. Has been doing this for 4 months.   ?No drooping of face, ?difficulty following directions or requests?  ?Seated 90/69 pulse in the 100's not on a diuretic, not diaphoretic or pale.  ?After recheck 139/79 pulse 54 ?Checked again after a few min 74/54 pulse 83 denied symptoms ?Wife/pt sts BP meds recently changed. Asked that wife manage medications to ensure correct amounts and times of meds. Also recording BP 2 x a day before meds and before bed.  ? Pt agreed.  ?Encouraged getting things checked at ER, wife wanted urgent care.  ? ?While on way to car, wife returned stating he was dizzy and sat him in lobby. She went to go get car and reached to PCP office to see if he could come in.  ?This Probation officer spoke with RN in office, advised ER ? stroke ?Escorted pt to vehicle, recommended [facility] Perez for eval.  ? ?

## 2022-03-13 NOTE — ED Notes (Signed)
Pt left. Pt stated he was feeling better and had an appointment with his PCP. MSE signed.  ?

## 2022-03-13 NOTE — ED Provider Triage Note (Signed)
Emergency Medicine Provider Triage Evaluation Note ? ?Joel Webb , a 76 y.o. male  was evaluated in triage.  Pt complains of orthostatic hypotension and balance issues.  Patient was at a physical therapy program and was noted to have balance issues.  Patient and patient's wife both endorse that patient has had issues with his balance for the last year.  Balance issues did not appear any worse today than previously.  Patient had no facial symmetry, dysarthria, visual disturbance, numbness, weakness. ? ?Patient and patient's wife reports that patient's blood pressure was low at doctor's office and physical therapy office.  There is concern for orthostatic hypotension.  Patient did endorse feeling some lightheadedness while at the physical therapy office earlier today.  No chest pain, shortness of breath, syncope, palpitations. ? ?Review of Systems  ?Positive: See above ?Negative: See above ? ?Physical Exam  ?BP 133/77 (BP Location: Right Arm)   Pulse 73   Temp 97.6 ?F (36.4 ?C) (Oral)   Resp 16   Ht '5\' 9"'$  (1.753 m)   Wt 85.7 kg   SpO2 98%   BMI 27.91 kg/m?  ?Gen:   Awake, no distress   ?Resp:  Normal effort  ?MSK:   Moves extremities without difficulty  ?Other:  CN II through XII intact.  Pronator drift negative.  Normal finger-to-nose and heel-to-shin.  Sensation to light touch grossly intact to bilateral upper and lower extremities. ? ?Medical Decision Making  ?Medically screening exam initiated at 3:32 PM.  Appropriate orders placed.  BANDON SHERWIN was informed that the remainder of the evaluation will be completed by another provider, this initial triage assessment does not replace that evaluation, and the importance of remaining in the ED until their evaluation is complete. ? ?We will obtain basic lab work and noncontrast head CT at this time. ?  ?Loni Beckwith, PA-C ?03/13/22 1534 ? ?

## 2022-03-13 NOTE — Patient Instructions (Addendum)
Stop Amlodipine and if BP goes over 140 then start 2.5 dose of amlodipine ? ?CALL OFFICE to report sugars today ? ?Check blood sugars on waking up 3 days a week ? ?Also check blood sugars about 2 hours after meals and do this after different meals by rotation ? ?Recommended blood sugar levels on waking up are 90-130 and about 2 hours after meal is 130-160 ? ?Please bring your blood sugar monitor to each visit, thank you ? ?Ozempic 0.5 g weekly and next Rx will be '1mg'$  ?

## 2022-03-13 NOTE — Progress Notes (Signed)
Patient ID: Joel Webb, male   DOB: [birth year], 76 y.o.   MRN: [id number] ? ?       ? ? ?Reason for Appointment: Follow-up  ? ? ? ?Referring physician: Dr. Moreen Fowler ? ?History of Present Illness:  ?        ?Date of diagnosis of type 2 diabetes mellitus: 2007      ? ?Background history:  ? ?He is unclear when he was diagnosed to have diabetes but was not very symptomatic ?He had been on metformin alone for several years with reportedly good control ?Also had previously been very consistent with exercise and diet ?His blood sugars probably started going up 3-4 years ago and he was given Lantus insulin in addition ?He was seen in consultation in 4/17 with an A1c of 8.4 and he was started Trulicity in addition to his Lantus and metformin ?This was stopped in 7/17 because of high out-of-pocket expense ? ?The V-go pump was stopped because he found it too uncomfortable and not sticking well ? ?Recent history:  ? ?INSULIN regimen is: Tresiba 40 units in p.m., Novolin R 6 at breakfast and 8  lunchtime; 12 units at supper ? ?Non-insulin hypoglycemic drugs the patient is taking are: metformin 1 g qd-bid a day, Trulicity 1.5 mg weekly ? ?Current management, blood sugar patterns and problems identified: ? ? ?His A1c is 6.9, was 7.4 compared to 7.1 ? ?He did not bring his blood sugar meter for download ?On the last visit he was with his wife and she was told to supervise his insulin injections where he would not take it at the right time and not miss any doses ?He thinks his fasting blood sugars have been as high as 180 but does not recall actual numbers and his lab fasting glucose was only 142  ?He is still watching his diet with getting more carbohydrates and sweets  ?On the previous visit Trulicity was supposed to be increased to 3 mg but this was apparently denied by insurance ?Apparently his insurance is not covering Trulicity and although you supposed to switch to Ozempic and has the 0.25/0.5 pen he has not started  this ?He is doing a little bit of walking but not going to the gym, to be starting in exercise program recommended by cardiologist ?Weight is about the same as on his last visit ?No hypoglycemia by history ?He is supposed to be on metformin twice a day but he may sometimes forget the evening dose ?He is reluctant to use the freestyle libre sensor as he does not want to keep any object on his arm ? ?Glucose monitoring:  done less than 1 times a day         Glucometer:  Accu-Chek ? ?Blood Glucose readings not available ? ? ?Previously: ? ?PRE-MEAL Fasting Lunch Dinner Bedtime Overall  ?Glucose range: 111-194 215 147-231    ?Mean/median:     189  ? ? ? ?Self-care:  ?Usually tries to limit High-fat foods and portions ?                ?Dietician visit, most recent:6/17 ?              ? ?Weight history: ? ?Wt Readings from Last 3 Encounters:  ?03/13/22 189 lb (85.7 kg)  ?03/13/22 189 lb (85.7 kg)  ?03/01/22 189 lb 6.4 oz (85.9 kg)  ? ? ?Glycemic control: ?  ?Lab Results  ?Component Value Date  ? HGBA1C 7.6 (H) 01/08/2022  ?  HGBA1C 6.9 (H) 10/02/2021  ? HGBA1C 7.4 (H) 07/02/2021  ? ?Lab Results  ?Component Value Date  ? MICROALBUR 41.3 (H) 01/08/2022  ? [place] 97 02/02/2016  ? CREATININE 1.48 03/07/2022  ? ?Lab Results  ?Component Value Date  ? MICRALBCREAT 54.3 (H) 01/08/2022  ? ? ?Lab Results  ?Component Value Date  ? FRUCTOSAMINE 275 03/07/2022  ? FRUCTOSAMINE 248 08/08/2020  ? FRUCTOSAMINE 266 06/02/2017  ? ? ? ?Other problems addressed today: See review of systems ? ? ?Lab on 03/07/2022  ?Component Date Value Ref Range Status  ? Fructosamine 03/07/2022 275  0 - 285 umol/L Final  ? Comment: Published reference interval for apparently healthy subjects ?between age 57 and 64 is 89 - 285 umol/L and in a poorly ?controlled diabetic population is 228 - 563 umol/L with a ?mean of 396 umol/L. ?  ? Sodium 03/07/2022 139  135 - 145 mEq/L Final  ? Potassium 03/07/2022 4.4  3.5 - 5.1 mEq/L Final  ? Chloride 03/07/2022 103  96 -  112 mEq/L Final  ? CO2 03/07/2022 27  19 - 32 mEq/L Final  ? Glucose, Bld 03/07/2022 142 (H)  70 - 99 mg/dL Final  ? BUN 03/07/2022 31 (H)  6 - 23 mg/dL Final  ? Creatinine, Ser 03/07/2022 1.48  0.40 - 1.50 mg/dL Final  ? GFR 03/07/2022 46.03 (L)  >60.00 mL/min Final  ? Calculated using the CKD-EPI Creatinine Equation (2021)  ? Calcium 03/07/2022 9.1  8.4 - 10.5 mg/dL Final  ? ? ? ? ?Allergies as of 03/13/2022   ?No Known Allergies ?  ? ?  ?Medication List  ?  ? ?  ? Accurate as of March 13, 2022  3:40 PM. If you have any questions, ask your nurse or doctor.  ?  ?  ? ?  ? ?STOP taking these medications   ? ?Ozempic (0.25 or 0.5 MG/DOSE) 2 MG/3ML Sopn ?Generic drug: Semaglutide(0.25 or 0.5MG/DOS) ?Stopped by: Elayne Snare, MD ?  ? ?  ? ?TAKE these medications   ? ?Accu-Chek Guide test strip ?Generic drug: glucose blood ?Use as instructed to check blood sugar once a day dx code E11.65 ?  ?Accu-Chek Guide w/Device Kit ?Use to check blood sugar once a day ?  ?amLODipine 5 MG tablet ?Commonly known as: NORVASC ?Take 5 mg by mouth daily. ?  ?atorvastatin 80 MG tablet ?Commonly known as: LIPITOR ?Take 80 mg by mouth daily. ?  ?BD Insulin Syringe U/F 31G X 5/16" 1 ML Misc ?Generic drug: Insulin Syringe-Needle U-100 ?USE TO GIVE 2 INSULIN INJECTIONS DAILY AS DIRECTED BY YOUR DOCTOR ?  ?DULoxetine 60 MG capsule ?Commonly known as: CYMBALTA ?60 mg daily. ?  ?finasteride 5 MG tablet ?Commonly known as: PROSCAR ?  ?Fish Oil 1000 MG Caps ?Take by mouth. 2x/day ?  ?Insulin Pen Needle 32G X 4 MM Misc ?Use on insulin pen ?  ?metFORMIN 1000 MG tablet ?Commonly known as: GLUCOPHAGE ?Take 1 tablet (1,000 mg total) by mouth 2 (two) times daily with a meal. ?  ?multivitamin with minerals Tabs tablet ?Take 1 tablet by mouth daily. ?  ?NovoLIN R 100 units/mL injection ?Generic drug: insulin regular ?INJECT 0.08 MLS(8 UNITS TOTAL) INTO THE SKIN TWICE DAILY BEFORE A MEAL; ALSO INJECT 10 UNITS UNDER THE SKIN ONCE DAILY AT SUPPER ?What changed:  See the new instructions. ?  ?olmesartan 20 MG tablet ?Commonly known as: BENICAR ?TAKE ONE TABLET BY MOUTH DAILY; (REPLACING BENICAR HCT-DOCTOR Adyn Serna) ?  ?omeprazole 20 MG  capsule ?Commonly known as: PRILOSEC ?Take 20 mg by mouth every other day. PRN ?  ?oxybutynin 5 MG 24 hr tablet ?Commonly known as: DITROPAN-XL ?06/12/20 not started ?  ?tamsulosin 0.4 MG Caps capsule ?Commonly known as: FLOMAX ?Take 0.4 mg by mouth. Reported on 05/29/2016 ?  ?Tyler Aas FlexTouch 200 UNIT/ML FlexTouch Pen ?Generic drug: insulin degludec ?INJECT 40 UNITS UNDER THE SKIN DAILY ?  ?Trulicity 1.5 IR/5.1OA Sopn ?Generic drug: Dulaglutide ?Inject into the skin. ?  ? ?  ? ? ?Allergies: No Known Allergies ? ?Past Medical History:  ?Diagnosis Date  ? CKD (chronic kidney disease), stage III ([place])   ? Depression   ? Diabetes mellitus without complication ([place])   ? ED (erectile dysfunction)   ? GERD (gastroesophageal reflux disease)   ? Hyperlipidemia   ? Hypertension   ? MCI (mild cognitive impairment)   ? Plantar fasciitis   ? Prostatitis   ? PVC's (premature ventricular contractions) 01/28/2022  ? ? ?Past Surgical History:  ?Procedure Laterality Date  ? CIRCUMCISION    ? COLONOSCOPY    ? MOUTH SURGERY  01/2020  ? ? ?Family History  ?Problem Relation Age of Onset  ? Hepatitis C Mother   ? Mesothelioma Father   ? Diabetes Father   ? Heart disease Father   ? ? ?Social History:  reports that he quit smoking about 13 years ago. His smoking use included cigarettes. He has never used smokeless tobacco. He reports current alcohol use. He reports that he does not use drugs. ? ?  ?Review of Systems  ? ?  ?RENAL dysfunction: Creatinine is as follows ? ? ?Lab Results  ?Component Value Date  ? CREATININE 1.48 03/07/2022  ? CREATININE 1.65 (H) 01/08/2022  ? CREATININE 1.43 (H) 11/02/2021  ? ? ?Lipid history: Has been on Lipitor 80 mg for cholesterol control from his PCP ? ?Labs show LDL 96 followed by PCP, still has high triglycerides ? ?  ?Lab Results   ?Component Value Date  ? CHOL 129 12/24/2016  ? HDL 23.70 (L) 12/24/2016  ? [place] 97 02/02/2016  ? LDLDIRECT 107.0 01/08/2022  ? TRIG 210.0 (H) 12/24/2016  ? CHOLHDL 5 12/24/2016  ?     ?    ?Hypertension:L

## 2022-03-13 NOTE — ED Triage Notes (Signed)
Brought to ER by wife because her and the [employer] instructor thought he was walking funny.  Reports he is always weak on left side due to previous stroke per MRI.  Denies abnormal weakness chest pain sob or pain ?

## 2022-03-14 ENCOUNTER — Telehealth: Payer: Self-pay

## 2022-03-14 NOTE — Telephone Encounter (Signed)
LVMT checking on pt today ?Also to inquire about a walker for better balance. Requested call back to discuss  ?

## 2022-03-14 NOTE — Telephone Encounter (Signed)
Call back from wife. Pt does have cane. We will encourage use until he is reevaled by PT/OT ?He has a referral pending.  ?Have asked Cards MD to consult with PharmD via Md nurse to med rec.  ?Not sure pt is able to manage meds well  ?Will see patient next week in class  ?

## 2022-03-19 ENCOUNTER — Ambulatory Visit: Payer: PPO | Attending: Diagnostic Neuroimaging

## 2022-03-19 DIAGNOSIS — R293 Abnormal posture: Secondary | ICD-10-CM | POA: Insufficient documentation

## 2022-03-19 DIAGNOSIS — R2689 Other abnormalities of gait and mobility: Secondary | ICD-10-CM | POA: Insufficient documentation

## 2022-03-19 DIAGNOSIS — R2681 Unsteadiness on feet: Secondary | ICD-10-CM | POA: Insufficient documentation

## 2022-03-19 DIAGNOSIS — M6281 Muscle weakness (generalized): Secondary | ICD-10-CM | POA: Insufficient documentation

## 2022-03-19 DIAGNOSIS — R269 Unspecified abnormalities of gait and mobility: Secondary | ICD-10-CM | POA: Diagnosis not present

## 2022-03-19 NOTE — Therapy (Signed)
?OUTPATIENT PHYSICAL THERAPY NEURO EVALUATION ? ? ?Patient Name: Joel Webb ?MRN: [id number] ?DOB:07-13-1946, 76 y.o., male ?Today's Date: 03/19/2022 ? ?PCP: Virl Diamond, MD  ?REFERRING PROVIDER: Penni Bombard, MD  ? ? PT End of Session - 03/19/22 1020   ? ? Visit Number 1   ? Number of Visits 6   ? Date for PT Re-Evaluation 04/30/22   ? Authorization Type [employer] 2023   ? Authorization Time Period no visit limit, no auth required   ? PT Start Time 1015   ? PT Stop Time 1100   ? PT Time Calculation (min) 45 min   ? Activity Tolerance Patient tolerated treatment well   ? Behavior During Therapy [facility] for tasks assessed/performed   ? ?  ?  ? ?  ? ? ?Past Medical History:  ?Diagnosis Date  ? CKD (chronic kidney disease), stage III ([place])   ? Depression   ? Diabetes mellitus without complication ([place])   ? ED (erectile dysfunction)   ? GERD (gastroesophageal reflux disease)   ? Hyperlipidemia   ? Hypertension   ? MCI (mild cognitive impairment)   ? Plantar fasciitis   ? Prostatitis   ? PVC's (premature ventricular contractions) 01/28/2022  ? ?Past Surgical History:  ?Procedure Laterality Date  ? CIRCUMCISION    ? COLONOSCOPY    ? MOUTH SURGERY  01/2020  ? ?Patient Active Problem List  ? Diagnosis Date Noted  ? PVC's (premature ventricular contractions) 01/28/2022  ? Microalbuminuria due to type 2 diabetes mellitus ([place]) 05/25/2020  ? Essential hypertension 06/05/2017  ? Uncontrolled type 2 diabetes mellitus with hyperglycemia, with long-term current use of insulin ([place]) 02/28/2016  ? Mixed hyperlipidemia 02/28/2016  ? ? ?ONSET DATE: 02/2020 ? ?REFERRING DIAG: R26.9 (ICD-10-CM) - Gait difficulty  ? ?THERAPY DIAG:  ?Unsteadiness on feet ? ?Other abnormalities of gait and mobility ? ?SUBJECTIVE:  ?                                                                                                                                                                                           ? ?SUBJECTIVE  STATEMENT: ?Pt notes tendency to lean to the left and reports possible hx of stroke which was revealed after MRI and reports possible right thalamus being affected. Pt notes issues of gait disturbance and reports 2 falls in past 6 months. Denies numbness/tingling ?Pt accompanied by: self ? ?PERTINENT HISTORY: CKD, Depression, DM, ED, GERD, HLD, HTN, MCI (mild cognitive impairment), plantar fasciitis, PVC ? ?PAIN:  ?Are you having pain? No ? ?PRECAUTIONS: None ? ?WEIGHT BEARING RESTRICTIONS No ? ?FALLS: Has patient fallen in  last 6 months? Yes. Number of falls 2  ? ?LIVING ENVIRONMENT: ?Lives with: lives with their family and lives with their spouse ?Lives in: House/apartment ?Stairs: Yes: Internal: 12 steps; can reach both and External: 5 steps; can reach both ?Has following equipment at home: Single point cane ? ?PLOF: Independent ? ?PATIENT GOALS Improve balance and be able to walk straight ? ?OBJECTIVE:  ? ? ? [facility] PT Assessment - 03/19/22 0001   ? ?  ? Single Leg Stance  ? Comments 5 sec RLE, 1 sec LLE   ?  ? Strength  ? Overall Strength Comments 4/5 gross strength   ?  ? Ambulation/Gait  ? Ambulation/Gait Yes   ? Ambulation/Gait Assistance 7: Independent   ? Gait velocity 8.97 sec = 3.65 ft/sec   ?  ? Berg Balance Test  ? Sit to Stand Able to stand without using hands and stabilize independently   ? Standing Unsupported Able to stand safely 2 minutes   ? Sitting with Back Unsupported but Feet Supported on Floor or Stool Able to sit safely and securely 2 minutes   ? Stand to Sit Sits safely with minimal use of hands   ? Transfers Able to transfer safely, minor use of hands   ? Standing Unsupported with Eyes Closed Able to stand 10 seconds with supervision   ? Standing Unsupported with Feet Together Able to place feet together independently and stand 1 minute safely   ? From Standing, Reach Forward with Outstretched Arm Can reach forward >12 cm safely (5")   ? From Standing Position, Pick up Object from [employer] to pick up shoe safely and easily   ? From Standing Position, Turn to Look Behind Over each Shoulder Looks behind from both sides and weight shifts well   ? Turn 360 Degrees Able to turn 360 degrees safely one side only in 4 seconds or less   ? Standing Unsupported, Alternately Place Feet on Step/Stool Able to stand independently and safely and complete 8 steps in 20 seconds   ? Standing Unsupported, One Foot in [employer] balance while stepping or standing   ? Standing on One Leg Tries to lift leg/unable to hold 3 seconds but remains standing independently   ? Total Score 46   ?  ? Dynamic Gait Index  ? Level Surface Normal   ? Change in Gait Speed Mild Impairment   ? Gait with Horizontal Head Turns Normal   ? Gait with Vertical Head Turns Normal   ? Gait and Pivot Turn Normal   ? Step Over Obstacle Mild Impairment   ? Step Around Obstacles Normal   ? Steps Normal   ? Total Score 22   ? ?  ?  ? ?  ? ? ?COGNITION: ?Overall cognitive status: Within functional limits for tasks assessed ?  ?SENSATION: ?WFL ? ?COORDINATION: ?Difficulty with rapid, alternating LE movements and difficulty in kinesthetic awareness ? ? ? ? ?POSTURE: No Significant postural limitations ? ?LE ROM:   WFL ? ? ?MMT:   ? ?Gross BLE strength 4/5  ? ?BED MOBILITY:  ?Independent ? ?TRANSFERS: ?Independent ? ? ? ?CURB:  ?Level of Assistance: Complete Independence ?Assistive device utilized: None ?Curb Comments: foot clearance issue, catching toe noted ? ?STAIRS: ? Level of Assistance: Complete Independence ? Stair Negotiation Technique: Alternating Pattern  with No Rails ? Number of Stairs: 6  ? Height of Stairs: 4-6"  ? ? ?GAIT: ?Gait pattern: decreased arm swing- Right, decreased arm  swing- Left, and decreased trunk rotation ?Distance walked: community-level ?Assistive device utilized: None ?Level of assistance: Complete Independence ?Comments:  ? ? ? ? ?TODAY'S TREATMENT:  ?Pt instructed in single leg stance and tandem stance with BUE  support 3x30 sec ? ? ?PATIENT EDUCATION: ?Education details: regarding assessment findings and techniques to address deficits ?Person educated: Patient ?Education method: Explanation ?Education comprehension: verbalized understanding ? ? ?HOME EXERCISE PROGRAM: ?Single leg stance, tandem stance 10-30 sec respectively ? ? ? ?GOALS: ?Goals reviewed with patient? Yes ? ?SHORT TERM GOALS: Target date: 04/09/2022 ? ?Patient will be independent in HEP to improve functional outcomes ?Baseline: ?Goal status: INITIAL ? ?2.  Demonstrate improved static balance per 30 sec tandem stance and 10 sec single leg stance to improve proximal stability ?Baseline: LOB ?Goal status: INITIAL ? ?3. Report improved static balance per ability to load/unload dishwasher without needing UE support. ? Baseline: Dependent on UE support ? Goal status: INITIAL ? ?LONG TERM GOALS: Target date: 04/30/2022 ? ?Independent with advanced home/gym exercise program ?Baseline:  ?Goal status: INITIAL ? ?2.  Patient will demonstrate score 50/56 Berg Balance Test to manifest low risk for falls ?Baseline: 46/56, PLOF was 50/56 ?Goal status: INITIAL ? ?3.  Demo improved safety with gait per 24/24 Dynamic Gait Index ?Baseline:  ?Goal status: INITIAL ? ? ?ASSESSMENT: ? ?CLINICAL IMPRESSION: ?Patient is a 76 y.o. male who was seen today for physical therapy evaluation and treatment for gait difficulty and history of falling. During assessment patient demonstrates static and dynamic balance deficits per score of 46/56 Berg Balance Test and 20/24 Dynamic Gait index indicating decline from his PLOF.  Patient would benefit from PT services to increase safety and independence with ambulation and community-level activities ? ? ?OBJECTIVE IMPAIRMENTS decreased balance, decreased coordination, and difficulty walking.  ? ?ACTIVITY LIMITATIONS cleaning, community activity, and meal prep.  ? ?PERSONAL FACTORS Age, Past/current experiences, and Time since onset of  injury/illness/exacerbation are also affecting patient's functional outcome.  ? ? ?REHAB POTENTIAL: Excellent ? ?CLINICAL DECISION MAKING: Stable/uncomplicated ? ?EVALUATION COMPLEXITY: Low ? ?PLAN: ?PT FREQUENCY: 1x/week ? ?PT DUR

## 2022-03-21 NOTE — Progress Notes (Signed)
[employer] PREP Weekly Session ? ?Patient Details  ?Name: Joel Webb ?MRN: [id number] ?Date of Birth: Aug 22, 1946 ?Age: 76 y.o. ?PCP: Antony Contras, MD ? ?There were no vitals filed for this visit. ? ? [employer] Weekly seesion - 03/21/22 1600   ? ?  ? [facility]PREP" Location  ? [facility] "PREP[facility] Location [place]   ?  ? Weekly Session  ? Topic Discussed Importance of resistance training;Other ways to be active   ? Minutes exercised this week --   none charted  ? Classes attended to date 4   ? ?  ?  ? ?  ? ?Has begun PT. Per wife, PT said no need for cane. Can incorporate PT's recommendations. Advised against treadmill for now until balance improves.  ?Will refer to Care Coordination ?[name] ?03/21/2022, 4:22 PM ? ? ?

## 2022-03-26 ENCOUNTER — Ambulatory Visit: Payer: PPO

## 2022-03-26 DIAGNOSIS — R2681 Unsteadiness on feet: Secondary | ICD-10-CM

## 2022-03-26 DIAGNOSIS — M6281 Muscle weakness (generalized): Secondary | ICD-10-CM

## 2022-03-26 DIAGNOSIS — R2689 Other abnormalities of gait and mobility: Secondary | ICD-10-CM

## 2022-03-26 DIAGNOSIS — R293 Abnormal posture: Secondary | ICD-10-CM

## 2022-03-26 NOTE — Therapy (Signed)
?OUTPATIENT PHYSICAL THERAPY TREATMENT NOTE ? ? ?Patient Name: Joel Webb ?MRN: [id number] ?DOB:04-24-1946, 76 y.o., male ?Today's Date: 03/26/2022 ? ?PCP: Antony Contras, MD ?REFERRING PROVIDER: Malon Kindle, MD  ? ?END OF SESSION:  ? PT End of Session - 03/26/22 1024   ? ? Visit Number 2   ? Number of Visits 6   ? Date for PT Re-Evaluation 04/30/22   ? Authorization Type [employer] 2023   ? Authorization Time Period no visit limit, no auth required   ? Progress Note Due on Visit 10   ? PT Start Time 1015   ? PT Stop Time 1100   ? PT Time Calculation (min) 45 min   ? Activity Tolerance Patient tolerated treatment well   ? Behavior During Therapy [facility] for tasks assessed/performed   ? ?  ?  ? ?  ? ? ?Past Medical History:  ?Diagnosis Date  ? CKD (chronic kidney disease), stage III ([place])   ? Depression   ? Diabetes mellitus without complication ([place])   ? ED (erectile dysfunction)   ? GERD (gastroesophageal reflux disease)   ? Hyperlipidemia   ? Hypertension   ? MCI (mild cognitive impairment)   ? Plantar fasciitis   ? Prostatitis   ? PVC's (premature ventricular contractions) 01/28/2022  ? ?Past Surgical History:  ?Procedure Laterality Date  ? CIRCUMCISION    ? COLONOSCOPY    ? MOUTH SURGERY  01/2020  ? ?Patient Active Problem List  ? Diagnosis Date Noted  ? PVC's (premature ventricular contractions) 01/28/2022  ? Microalbuminuria due to type 2 diabetes mellitus ([place]) 05/25/2020  ? Essential hypertension 06/05/2017  ? Uncontrolled type 2 diabetes mellitus with hyperglycemia, with long-term current use of insulin ([place]) 02/28/2016  ? Mixed hyperlipidemia 02/28/2016  ? ? ?REFERRING DIAG: R26.9R26.9 (ICD-10-CM) - Gait difficulty  ? ?THERAPY DIAG:  ?Unsteadiness on feet ? ?Other abnormalities of gait and mobility ? ?Muscle weakness (generalized) ? ?Abnormal posture ? ?PERTINENT HISTORY:  ? ?PRECAUTIONS:  ? ?SUBJECTIVE: Still can't stand on one leg. Tend to lose balance when bending over ? ?PAIN:  ?Are  you having pain? No ? ? ? ?  ?  ? TODAY'S TREATMENT:  ?Pt instructed in single leg stance 3x15 sec and tandem stance with BUE support 2x30 sec ?Hip hinge with bolster to guide posture 3x10 reps ?Single leg deadlift over mat table for eccentric control ?Static standing balance in corner on airex pad: static x 30 sec, eyes closed x30, head rotation/up-down 5x ?Sit to stand with stride stance 2x5 reps ?  ?  ?PATIENT EDUCATION: ?Education details: regarding assessment findings and techniques to address deficits ?Person educated: Patient ?Education method: Explanation ?Education comprehension: verbalized understanding ?  ?  ?HOME EXERCISE PROGRAM: ?Single leg stance, tandem stance 10-30 sec respectively. Sit-stand with stride stance, standing balance in corner eyes open/closed ?  ?  ?  ?GOALS: ?Goals reviewed with patient? Yes ?  ?SHORT TERM GOALS: Target date: 04/09/2022 ?  ?Patient will be independent in HEP to improve functional outcomes ?Baseline: ?Goal status: INITIAL ?  ?2.  Demonstrate improved static balance per 30 sec tandem stance and 10 sec single leg stance to improve proximal stability ?Baseline: LOB ?Goal status: INITIAL ?  ?3. Report improved static balance per ability to load/unload dishwasher without needing UE support. ?           Baseline: Dependent on UE support ?           Goal status: INITIAL ?  ?  LONG TERM GOALS: Target date: 04/30/2022 ?  ?Independent with advanced home/gym exercise program ?Baseline:  ?Goal status: INITIAL ?  ?2.  Patient will demonstrate score 50/56 Berg Balance Test to manifest low risk for falls ?Baseline: 46/56, PLOF was 50/56 ?Goal status: INITIAL ?  ?3.  Demo improved safety with gait per 24/24 Dynamic Gait Index ?Baseline:  ?Goal status: INITIAL ?  ?  ?ASSESSMENT: ?  ?CLINICAL IMPRESSION: ?Demonstrates difficulty with sequencing for hip hinge and squat form. Improved performance with tactile cues for guiding hip extension for single leg deadlift. Retro-LOB with compliant  surfaces demonstrating reduced distal control vs proximal ?  ?  ?OBJECTIVE IMPAIRMENTS decreased balance, decreased coordination, and difficulty walking.  ?  ?ACTIVITY LIMITATIONS cleaning, community activity, and meal prep.  ?  ?PERSONAL FACTORS Age, Past/current experiences, and Time since onset of injury/illness/exacerbation are also affecting patient's functional outcome.  ?  ?  ?REHAB POTENTIAL: Excellent ?  ?CLINICAL DECISION MAKING: Stable/uncomplicated ?  ?EVALUATION COMPLEXITY: Low ?  ?PLAN: ?PT FREQUENCY: 1x/week ?  ?PT DURATION: 6 weeks ?  ?PLANNED INTERVENTIONS: Therapeutic exercises, Therapeutic activity, Neuromuscular re-education, Balance training, Gait training, Patient/Family education, Joint mobilization, Vestibular training, Spinal manipulation, Spinal mobilization, Taping, and Manual therapy ?  ?PLAN FOR NEXT SESSION: reassess static balance HEP (SLS and tandem), dishwasher simulation? ? ? ? ?Toniann Fail, PT ?03/26/2022, 10:28 AM ? ?   ?

## 2022-03-27 NOTE — Progress Notes (Signed)
[employer] PRE[facility] Weekly Session ? ?Patient Details  ?Name: Joel Webb ?MRN: [id number] ?Date of Birth: Apr 03, 1946 ?Age: 76 y.o. ?PCP: Antony Contras, MD ? ?Vitals:  ? 03/25/22 1300  ?Weight: 191 lb (86.6 kg)  ? ? ? [employer] Weekly seesion - 03/27/22 1200   ? ?  ? [facility]  ? [facility] [place]   ?  ? Weekly Session  ? Topic Discussed Healthy eating tips   ? Minutes exercised this week 75 minutes   ? Classes attended to date 5   ? ?  ?  ? ?  ? ? ?[name] ?03/27/2022, 12:33 PM ? ? ?

## 2022-03-28 DIAGNOSIS — L989 Disorder of the skin and subcutaneous tissue, unspecified: Secondary | ICD-10-CM | POA: Diagnosis not present

## 2022-03-28 DIAGNOSIS — I10 Essential (primary) hypertension: Secondary | ICD-10-CM | POA: Diagnosis not present

## 2022-03-28 DIAGNOSIS — F339 Major depressive disorder, recurrent, unspecified: Secondary | ICD-10-CM | POA: Diagnosis not present

## 2022-03-31 ENCOUNTER — Other Ambulatory Visit: Payer: Self-pay | Admitting: Endocrinology

## 2022-04-01 NOTE — Progress Notes (Signed)
[facility] PREP Weekly Session ? ?Patient Details  ?Name: Joel Webb ?MRN: [id number] ?Date of Birth: 02-03-1946 ?Age: 76 y.o. ?PCP: Antony Contras, MD ? ?Vitals:  ? 04/01/22 1516  ?Weight: 188 lb 9.6 oz (85.5 kg)  ? ? ? [employer] Weekly seesion - 04/01/22 1500   ? ?  ? [facility]  ? [facility] [place]   ?  ? Weekly Session  ? Topic Discussed Health habits   ? Minutes exercised this week 55 minutes   ? Classes attended to date 7   ? ?  ?  ? ?  ? ? ?[name] ?04/01/2022, 3:17 PM ? ? ?

## 2022-04-02 ENCOUNTER — Ambulatory Visit: Payer: PPO

## 2022-04-02 DIAGNOSIS — R2681 Unsteadiness on feet: Secondary | ICD-10-CM

## 2022-04-02 DIAGNOSIS — R2689 Other abnormalities of gait and mobility: Secondary | ICD-10-CM

## 2022-04-02 DIAGNOSIS — M6281 Muscle weakness (generalized): Secondary | ICD-10-CM

## 2022-04-02 DIAGNOSIS — R293 Abnormal posture: Secondary | ICD-10-CM

## 2022-04-02 NOTE — Therapy (Signed)
?OUTPATIENT PHYSICAL THERAPY TREATMENT NOTE ? ? ? ?OUTPATIENT PHYSICAL THERAPY TREATMENT NOTE ? ? ?Patient Name: Joel Webb ?MRN: [id number] ?DOB:01-07-1946, 76 y.o., male ?Today's Date: 04/02/2022 ? ?PCP: Antony Contras, MD ?REFERRING PROVIDER: Penni Bombard, MD  ? ?END OF SESSION:  ? PT End of Session - 04/02/22 0930   ? ? Visit Number 3   ? Number of Visits 6   ? Date for PT Re-Evaluation 04/30/22   ? Authorization Type [employer] 2023   ? Authorization Time Period no visit limit, no auth required   ? Progress Note Due on Visit 10   ? PT Start Time 0930   ? PT Stop Time 1015   ? PT Time Calculation (min) 45 min   ? Activity Tolerance Patient tolerated treatment well   ? Behavior During Therapy [facility] for tasks assessed/performed   ? ?  ?  ? ?  ? ? ?Past Medical History:  ?Diagnosis Date  ? CKD (chronic kidney disease), stage III ([place])   ? Depression   ? Diabetes mellitus without complication ([place])   ? ED (erectile dysfunction)   ? GERD (gastroesophageal reflux disease)   ? Hyperlipidemia   ? Hypertension   ? MCI (mild cognitive impairment)   ? Plantar fasciitis   ? Prostatitis   ? PVC's (premature ventricular contractions) 01/28/2022  ? ?Past Surgical History:  ?Procedure Laterality Date  ? CIRCUMCISION    ? COLONOSCOPY    ? MOUTH SURGERY  01/2020  ? ?Patient Active Problem List  ? Diagnosis Date Noted  ? PVC's (premature ventricular contractions) 01/28/2022  ? Microalbuminuria due to type 2 diabetes mellitus ([place]) 05/25/2020  ? Essential hypertension 06/05/2017  ? Uncontrolled type 2 diabetes mellitus with hyperglycemia, with long-term current use of insulin ([place]) 02/28/2016  ? Mixed hyperlipidemia 02/28/2016  ? ? ?REFERRING DIAG: R26.9R26.9 (ICD-10-CM) - Gait difficulty  ? ?THERAPY DIAG:  ?Unsteadiness on feet ? ?Other abnormalities of gait and mobility ? ?Muscle weakness (generalized) ? ?Abnormal posture ? ?PERTINENT HISTORY:  ? ?PRECAUTIONS:  ? ?SUBJECTIVE: Continues to participate with  healthy lifestyle program for diet and exercise ? ?PAIN:  ?Are you having pain? No ? ? ? ?  ?  ? TODAY'S TREATMENT:  ?Nu-step level 5 x 5 min for dynamic warm-up ?Sit-stand stride stance and wide BOS to bias LLE 3x5 ?Single leg stance with intermittent UE support 4x10 sec left/right ?Tandem Stance 2x30 sec ea. ?Lateral step over bolster: 60 sec start/stop, 60 sec without pause. -- difficulty pushing from left ?Forward/backward step over half bolster x 2 min w/ cognitive dual-tasking ?Static standing on airex pad: 30 sec eyes open/closed; head turns eyes open/closed x 30 sec each. Marching on airex pad x 60 sec ?  ?  ?PATIENT EDUCATION: ?Education details: regarding assessment findings and techniques to address deficits ?Person educated: Patient ?Education method: Explanation ?Education comprehension: verbalized understanding ?  ?  ?HOME EXERCISE PROGRAM: ?Single leg stance, tandem stance 10-30 sec respectively. Sit-stand with stride stance, standing balance in corner eyes open/closed; side-side step over broom handle for speed ?  ?  ?  ?GOALS: ?Goals reviewed with patient? Yes ?  ?SHORT TERM GOALS: Target date: 04/09/2022 ?  ?Patient will be independent in HEP to improve functional outcomes ?Baseline: ?Goal status: INITIAL ?  ?2.  Demonstrate improved static balance per 30 sec tandem stance and 10 sec single leg stance to improve proximal stability ?Baseline: LOB ?Goal status: INITIAL ?  ?3. Report improved static balance per ability  to load/unload dishwasher without needing UE support. ?           Baseline: Dependent on UE support ?           Goal status: INITIAL ?  ?LONG TERM GOALS: Target date: 04/30/2022 ?  ?Independent with advanced home/gym exercise program ?Baseline:  ?Goal status: INITIAL ?  ?2.  Patient will demonstrate score 50/56 Berg Balance Test to manifest low risk for falls ?Baseline: 46/56, PLOF was 50/56 ?Goal status: INITIAL ?  ?3.  Demo improved safety with gait per 24/24 Dynamic Gait  Index ?Baseline:  ?Goal status: INITIAL ?  ?  ?ASSESSMENT: ?  ?CLINICAL IMPRESSION: ?Difficulty with coordinating constant motion requiring increased double limb support especially with cognitive dual-tasking inserted to activity with decline in accuracy of movement noted LLE as well. Improved static balance on airex pad with decrease in postural sway, especially when eyes closed (severe sway vs complete LOB). Continues with difficulty in maintaining tandem stance with frequent LOB ?  ?  ?OBJECTIVE IMPAIRMENTS decreased balance, decreased coordination, and difficulty walking.  ?  ?ACTIVITY LIMITATIONS cleaning, community activity, and meal prep.  ?  ?PERSONAL FACTORS Age, Past/current experiences, and Time since onset of injury/illness/exacerbation are also affecting patient's functional outcome.  ?  ?  ?REHAB POTENTIAL: Excellent ?  ?CLINICAL DECISION MAKING: Stable/uncomplicated ?  ?EVALUATION COMPLEXITY: Low ?  ?PLAN: ?PT FREQUENCY: 1x/week ?  ?PT DURATION: 6 weeks ?  ?PLANNED INTERVENTIONS: Therapeutic exercises, Therapeutic activity, Neuromuscular re-education, Balance training, Gait training, Patient/Family education, Joint mobilization, Vestibular training, Spinal manipulation, Spinal mobilization, Taping, and Manual therapy ?  ?PLAN FOR NEXT SESSION: reassess static balance HEP (SLS and tandem), dishwasher simulation? ? ? ? ?10:29 AM, 04/02/22 ?Celedonio Miyamoto, PT, DPT ?Physical Therapist- [place] ?Office Number: [phone] ? ? ?     ?

## 2022-04-08 ENCOUNTER — Telehealth: Payer: Self-pay

## 2022-04-08 NOTE — Telephone Encounter (Signed)
Patient's wife, [name], per DPR left a voicemail with our office this afternoon asking for a call to discuss how she can legally declare her husband incompetent to handle business affairs.

## 2022-04-09 ENCOUNTER — Ambulatory Visit: Payer: PPO

## 2022-04-09 DIAGNOSIS — R293 Abnormal posture: Secondary | ICD-10-CM

## 2022-04-09 DIAGNOSIS — M6281 Muscle weakness (generalized): Secondary | ICD-10-CM

## 2022-04-09 DIAGNOSIS — R2681 Unsteadiness on feet: Secondary | ICD-10-CM | POA: Diagnosis not present

## 2022-04-09 DIAGNOSIS — R2689 Other abnormalities of gait and mobility: Secondary | ICD-10-CM

## 2022-04-09 NOTE — Therapy (Signed)
OUTPATIENT PHYSICAL THERAPY TREATMENT NOTE    OUTPATIENT PHYSICAL THERAPY TREATMENT NOTE   Patient Name: Joel Webb MRN: [id number] DOB:07-06-1946, 76 y.o., male Today's Date: 04/09/2022  PCP: Antony Contras, MD REFERRING PROVIDER: Penni Bombard, MD   END OF SESSION:   PT End of Session - 04/09/22 0944     Visit Number 4    Number of Visits 6    Date for PT Re-Evaluation 04/30/22    Authorization Type [employer] 2023    Authorization Time Period no visit limit, no auth required    Progress Note Due on Visit 10    PT Start Time [phone]   late arrival   PT Stop Time 1015    PT Time Calculation (min) 33 min    Activity Tolerance Patient tolerated treatment well    Behavior During Therapy WFL for tasks assessed/performed             Past Medical History:  Diagnosis Date   CKD (chronic kidney disease), stage III ([place])    Depression    Diabetes mellitus without complication ([place])    ED (erectile dysfunction)    GERD (gastroesophageal reflux disease)    Hyperlipidemia    Hypertension    MCI (mild cognitive impairment)    Plantar fasciitis    Prostatitis    PVC's (premature ventricular contractions) 01/28/2022   Past Surgical History:  Procedure Laterality Date   CIRCUMCISION     COLONOSCOPY     MOUTH SURGERY  01/2020   Patient Active Problem List   Diagnosis Date Noted   PVC's (premature ventricular contractions) 01/28/2022   Microalbuminuria due to type 2 diabetes mellitus ([place]) 05/25/2020   Essential hypertension 06/05/2017   Uncontrolled type 2 diabetes mellitus with hyperglycemia, with long-term current use of insulin ([place]) 02/28/2016   Mixed hyperlipidemia 02/28/2016    REFERRING DIAG: R26.9R26.9 (ICD-10-CM) - Gait difficulty   THERAPY DIAG:  Unsteadiness on feet  Other abnormalities of gait and mobility  Muscle weakness (generalized)  Abnormal posture  PERTINENT HISTORY:   PRECAUTIONS:   SUBJECTIVE: Continuing to  experience frequent LOB and bumping into things, fall to the left  PAIN:  Are you having pain? No      TODAY'S TREATMENT: 04/09/22 Nu-step level 5 x 5 min for dynamic warm-up Sit-stand stride stance and wide BOS to bias LLE 3x5 Single leg stance with intermittent UE support 4x10 sec left/right--intermittent UE support Tandem Stance 2x30 sec ea.--intermittent UE support Static standing on airex pad: 30 sec eyes open/closed; head turns eyes open/closed x 30 sec each. Marching on airex pad x 60 sec Trunk twists with swiss ball 2x10 reps:leg to wall cue Kicking ball pass x 2 min for single limb support     PATIENT EDUCATION: Education details: regarding assessment findings and techniques to address deficits Person educated: Patient Education method: Explanation Education comprehension: verbalized understanding     HOME EXERCISE PROGRAM: Single leg stance, tandem stance 10-30 sec respectively. Sit-stand with stride stance, standing balance in corner eyes open/closed; side-side step over broom handle for speed       GOALS: Goals reviewed with patient? Yes   SHORT TERM GOALS: Target date: 04/09/2022   Patient will be independent in HEP to improve functional outcomes Baseline: Goal status: INITIAL   2.  Demonstrate improved static balance per 30 sec tandem stance and 10 sec single leg stance to improve proximal stability Baseline: LOB Goal status: INITIAL   3. Report improved static balance per ability to  load/unload dishwasher without needing UE support.            Baseline: Dependent on UE support            Goal status: INITIAL   LONG TERM GOALS: Target date: 04/30/2022   Independent with advanced home/gym exercise program Baseline:  Goal status: INITIAL   2.  Patient will demonstrate score 50/56 Berg Balance Test to manifest low risk for falls Baseline: 46/56, PLOF was 50/56 Goal status: INITIAL   3.  Demo improved safety with gait per 24/24 Dynamic Gait  Index Baseline:  Goal status: INITIAL     ASSESSMENT:   CLINICAL IMPRESSION: Difficulty with narrow BOS and single limb support especially LLE bias. Increased postural sway on compliant surface/eyes closed. LLE coordination deficits with reduced ability to initiate and maintain rapid alternating movements.  Denies any feeling of dizziness that precedes LOB and just notes instability during activity. Continued sessions to progress balance and strength HEP to reduce risk for falls     OBJECTIVE IMPAIRMENTS decreased balance, decreased coordination, and difficulty walking.    ACTIVITY LIMITATIONS cleaning, community activity, and meal prep.    PERSONAL FACTORS Age, Past/current experiences, and Time since onset of injury/illness/exacerbation are also affecting patient's functional outcome.      REHAB POTENTIAL: Excellent   CLINICAL DECISION MAKING: Stable/uncomplicated   EVALUATION COMPLEXITY: Low   PLAN: PT FREQUENCY: 1x/week   PT DURATION: 6 weeks   PLANNED INTERVENTIONS: Therapeutic exercises, Therapeutic activity, Neuromuscular re-education, Balance training, Gait training, Patient/Family education, Joint mobilization, Vestibular training, Spinal manipulation, Spinal mobilization, Taping, and Manual therapy   PLAN FOR NEXT SESSION: reassess static balance HEP (SLS and tandem), dishwasher simulation?    9:46 AM, 04/09/22 M. Sherlyn Lees, PT, DPT Physical Therapist- [place] Office Number: [phone]

## 2022-04-11 NOTE — Progress Notes (Signed)
[facility] PREP Weekly Session  Patient Details  Name: Joel Webb MRN: [id number] Date of Birth: 1946-04-27 Age: 76 y.o. PCP: Antony Contras, MD  Vitals:   04/08/22 1300  Weight: 190 lb 6.4 oz (86.4 kg)     [employer] Weekly seesion - 04/11/22 1600       [facility] "PREP" Location   [employer] [facility] [employer]      Weekly Session   Topic Discussed Restaurant Eating   Salt and sugar demo   Minutes exercised this week 160 minutes    Classes attended to date [place] 04/11/2022, 4:41 PM

## 2022-04-16 ENCOUNTER — Ambulatory Visit: Payer: PPO

## 2022-04-16 DIAGNOSIS — E1165 Type 2 diabetes mellitus with hyperglycemia: Secondary | ICD-10-CM | POA: Diagnosis not present

## 2022-04-16 DIAGNOSIS — I10 Essential (primary) hypertension: Secondary | ICD-10-CM | POA: Diagnosis not present

## 2022-04-16 DIAGNOSIS — R2681 Unsteadiness on feet: Secondary | ICD-10-CM | POA: Diagnosis not present

## 2022-04-16 DIAGNOSIS — M6281 Muscle weakness (generalized): Secondary | ICD-10-CM

## 2022-04-16 DIAGNOSIS — R293 Abnormal posture: Secondary | ICD-10-CM

## 2022-04-16 DIAGNOSIS — E7800 Pure hypercholesterolemia, unspecified: Secondary | ICD-10-CM | POA: Diagnosis not present

## 2022-04-16 DIAGNOSIS — N189 Chronic kidney disease, unspecified: Secondary | ICD-10-CM | POA: Diagnosis not present

## 2022-04-16 DIAGNOSIS — R2689 Other abnormalities of gait and mobility: Secondary | ICD-10-CM

## 2022-04-16 NOTE — Therapy (Signed)
OUTPATIENT PHYSICAL THERAPY TREATMENT NOTE    OUTPATIENT PHYSICAL THERAPY TREATMENT NOTE   Patient Name: Joel Webb MRN: [id number] DOB:1946/03/31, 76 y.o., male Today's Date: 04/16/2022  PCP: Antony Contras, MD REFERRING PROVIDER: Penni Bombard, MD   END OF SESSION:   PT End of Session - 04/16/22 1000     Visit Number 5    Number of Visits 6    Date for PT Re-Evaluation 04/30/22    Authorization Type [employer] 2023    Authorization Time Period no visit limit, no auth required    Progress Note Due on Visit 10    PT Start Time 1000    PT Stop Time 1045    PT Time Calculation (min) 45 min    Activity Tolerance Patient tolerated treatment well    Behavior During Therapy WFL for tasks assessed/performed             Past Medical History:  Diagnosis Date   CKD (chronic kidney disease), stage III ([place])    Depression    Diabetes mellitus without complication ([place])    ED (erectile dysfunction)    GERD (gastroesophageal reflux disease)    Hyperlipidemia    Hypertension    MCI (mild cognitive impairment)    Plantar fasciitis    Prostatitis    PVC's (premature ventricular contractions) 01/28/2022   Past Surgical History:  Procedure Laterality Date   CIRCUMCISION     COLONOSCOPY     MOUTH SURGERY  01/2020   Patient Active Problem List   Diagnosis Date Noted   PVC's (premature ventricular contractions) 01/28/2022   Microalbuminuria due to type 2 diabetes mellitus ([place]) 05/25/2020   Essential hypertension 06/05/2017   Uncontrolled type 2 diabetes mellitus with hyperglycemia, with long-term current use of insulin ([place]) 02/28/2016   Mixed hyperlipidemia 02/28/2016    REFERRING DIAG: R26.9R26.9 (ICD-10-CM) - Gait difficulty   THERAPY DIAG:  Unsteadiness on feet  Other abnormalities of gait and mobility  Muscle weakness (generalized)  Abnormal posture  PERTINENT HISTORY:   PRECAUTIONS:   SUBJECTIVE: [facility] [employer] for [facility] and  exercise classes  PAIN:  Are you having pain? No      TODAY'S TREATMENT: 04/16/22 Sit-stand stride stance and wide BOS to bias LLE 3x5 Sidestep left/right x 10 ft to 5 sec single leg stance Tandem Stance 2x30 sec ea.--intermittent UE support, 30 sec ea position head turns Static standing on airex pad: 30 sec eyes open/closed; head turns eyes open/closed x 30 sec each. Marching on airex pad x 60 sec Retrowalking x 2 min with CGA  Tandem walking x 2 min with CGA 4-square step over 4x clockwise/counter Soccer ball pass for single limb balance x 2 min Walking and dribbling or throw/catch x 2 min to improve coordination and multi-tasking Nu-step level 5 x 5 min for improved activity tolerance     PATIENT EDUCATION: Education details: discussion of performing balance activities in pool Person educated: Patient Education method: Explanation Education comprehension: verbalized understanding     HOME EXERCISE PROGRAM: Single leg stance, tandem stance 10-30 sec respectively. Sit-stand with stride stance, standing balance in corner eyes open/closed; side-side step over broom handle for speed       GOALS: Goals reviewed with patient? Yes   SHORT TERM GOALS: Target date: 04/09/2022   Patient will be independent in HEP to improve functional outcomes Baseline: Goal status: INITIAL   2.  Demonstrate improved static balance per 30 sec tandem stance and 10 sec single leg stance to  improve proximal stability Baseline: LOB Goal status: INITIAL   3. Report improved static balance per ability to load/unload dishwasher without needing UE support.            Baseline: Dependent on UE support            Goal status: INITIAL   LONG TERM GOALS: Target date: 04/30/2022   Independent with advanced home/gym exercise program Baseline:  Goal status: INITIAL   2.  Patient will demonstrate score 50/56 Berg Balance Test to manifest low risk for falls Baseline: 46/56, PLOF was 50/56 Goal status:  INITIAL   3.  Demo improved safety with gait per 24/24 Dynamic Gait Index Baseline:  Goal status: INITIAL     ASSESSMENT:   CLINICAL IMPRESSION: Continued left-bias LOB during static and dynamic balance activities.  Difficulty with multi-tasking activities but no LOB experienced with activities causing reactive balance strategies and able to bend over to pick up items from ground without LOB. Recommend continued practice of balance activities with safety set-up using corner of walls and object in front, or in aquatic environ to improve safety     OBJECTIVE IMPAIRMENTS decreased balance, decreased coordination, and difficulty walking.    ACTIVITY LIMITATIONS cleaning, community activity, and meal prep.    PERSONAL FACTORS Age, Past/current experiences, and Time since onset of injury/illness/exacerbation are also affecting patient's functional outcome.      REHAB POTENTIAL: Excellent   CLINICAL DECISION MAKING: Stable/uncomplicated   EVALUATION COMPLEXITY: Low   PLAN: PT FREQUENCY: 1x/week   PT DURATION: 6 weeks   PLANNED INTERVENTIONS: Therapeutic exercises, Therapeutic activity, Neuromuscular re-education, Balance training, Gait training, Patient/Family education, Joint mobilization, Vestibular training, Spinal manipulation, Spinal mobilization, Taping, and Manual therapy   PLAN FOR NEXT SESSION: D/C summary    10:00 AM, 04/16/22 M. Sherlyn Lees, PT, DPT Physical Therapist- [place] Office Number: [phone]

## 2022-04-19 ENCOUNTER — Telehealth: Payer: Self-pay | Admitting: Pharmacy Technician

## 2022-04-19 ENCOUNTER — Other Ambulatory Visit (HOSPITAL_COMMUNITY): Payer: Self-pay

## 2022-04-19 ENCOUNTER — Other Ambulatory Visit: Payer: Self-pay

## 2022-04-19 MED ORDER — ONETOUCH VERIO VI STRP: ORAL_STRIP | 2 refills | Status: DC

## 2022-04-19 MED ORDER — ONETOUCH DELICA LANCETS 33G MISC: 2 refills | Status: DC

## 2022-04-19 MED ORDER — ONETOUCH VERIO W/DEVICE KIT: PACK | 0 refills | Status: DC

## 2022-04-19 NOTE — Telephone Encounter (Signed)
Received fax from [place] that pt needs a PA for Accu-Chek Guide test strips.  Per test claim, pt's ins, [employer] prefers One touch system.

## 2022-04-23 ENCOUNTER — Ambulatory Visit: Payer: PPO | Attending: Diagnostic Neuroimaging

## 2022-04-23 DIAGNOSIS — R2681 Unsteadiness on feet: Secondary | ICD-10-CM | POA: Diagnosis not present

## 2022-04-23 DIAGNOSIS — R2689 Other abnormalities of gait and mobility: Secondary | ICD-10-CM | POA: Insufficient documentation

## 2022-04-23 DIAGNOSIS — M6281 Muscle weakness (generalized): Secondary | ICD-10-CM | POA: Insufficient documentation

## 2022-04-23 DIAGNOSIS — R293 Abnormal posture: Secondary | ICD-10-CM | POA: Insufficient documentation

## 2022-04-23 NOTE — Progress Notes (Signed)
[facility] PREP Weekly Session  Patient Details  Name: Joel Webb MRN: [id number] Date of Birth: 1946-01-26 Age: 76 y.o. PCP: Antony Contras, MD  Vitals:   04/22/22 1300  Weight: 187 lb 3.2 oz (84.9 kg)     [facility] Weekly seesion - 04/23/22 1700       [facility] [facility]PREP[facility] Location   [employer] [facility] [employer]      Weekly Session   Topic Discussed Stress management and problem solving   meditation, stretching, breathwork   Minutes exercised this week 60 minutes    Classes attended to date 12             [name] 04/23/2022, 5:27 PM

## 2022-04-23 NOTE — Therapy (Signed)
OUTPATIENT PHYSICAL THERAPY TREATMENT NOTE    OUTPATIENT PHYSICAL THERAPY TREATMENT NOTE PHYSICAL THERAPY DISCHARGE SUMMARY  Visits from Start of Care: 6  Current functional level related to goals / functional outcomes: Progress with POC details and demo low risk for falls per Merrilee Jansky Balance test and Dynamic Gait index scores, see below   Remaining deficits: Balance/proprioception deficits present   Education / Equipment: HEP   Patient agrees to discharge. Patient goals were partially met. Patient is being discharged due to meeting the stated rehab goals.    Patient Name: Joel Webb MRN: [id number] DOB:19-May-1946, 76 y.o., male Today's Date: 04/23/2022  PCP: Antony Contras, MD REFERRING PROVIDER: Penni Bombard, MD   END OF SESSION:   PT End of Session - 04/23/22 1011     Visit Number 6    Number of Visits 6    Date for PT Re-Evaluation 04/30/22    Authorization Type [employer] 2023    Authorization Time Period no visit limit, no auth required    Progress Note Due on Visit 10    PT Start Time 1010    PT Stop Time 1100    PT Time Calculation (min) 50 min    Activity Tolerance Patient tolerated treatment well    Behavior During Therapy WFL for tasks assessed/performed             Past Medical History:  Diagnosis Date   CKD (chronic kidney disease), stage III ([place])    Depression    Diabetes mellitus without complication ([place])    ED (erectile dysfunction)    GERD (gastroesophageal reflux disease)    Hyperlipidemia    Hypertension    MCI (mild cognitive impairment)    Plantar fasciitis    Prostatitis    PVC's (premature ventricular contractions) 01/28/2022   Past Surgical History:  Procedure Laterality Date   CIRCUMCISION     COLONOSCOPY     MOUTH SURGERY  01/2020   Patient Active Problem List   Diagnosis Date Noted   PVC's (premature ventricular contractions) 01/28/2022   Microalbuminuria due to type 2 diabetes mellitus ([place])  05/25/2020   Essential hypertension 06/05/2017   Uncontrolled type 2 diabetes mellitus with hyperglycemia, with long-term current use of insulin ([place]) 02/28/2016   Mixed hyperlipidemia 02/28/2016    REFERRING DIAG: R26.9R26.9 (ICD-10-CM) - Gait difficulty   THERAPY DIAG:  Unsteadiness on feet  Other abnormalities of gait and mobility  Muscle weakness (generalized)  Abnormal posture  PERTINENT HISTORY:   PRECAUTIONS:   SUBJECTIVE: Feeling well, no falls, no new issues. Attending [employer] 2-3x/wk  PAIN:  Are you having pain? No      TODAY'S TREATMENT: 04/23/22 Merrilee Jansky Balance Test: 51/56 DGI: 23/24 Dishwasher simulation practice, no LOB with bending over Sit-stand stride stance and wide BOS to bias LLE 2x15 Agility ladder drills: forward/backward, sidestep over, fast/alternating     PATIENT EDUCATION: Education details: discussion of performing balance activities in pool Person educated: Patient Education method: Explanation Education comprehension: verbalized understanding     HOME EXERCISE PROGRAM: Single leg stance, tandem stance 10-30 sec respectively. Sit-stand with stride stance, standing balance in corner eyes open/closed; side-side step over broom handle for speed       GOALS: Goals reviewed with patient? Yes   SHORT TERM GOALS: Target date: 04/09/2022   Patient will be independent in HEP to improve functional outcomes Baseline: Goal status: MET   2.  Demonstrate improved static balance per 30 sec tandem stance and 10 sec single leg  stance to improve proximal stability Baseline: LOB; 15 sec tandem, 3-5 sec SLS Goal status: Not Met   3. Report improved static balance per ability to load/unload dishwasher without needing UE support.            Baseline: No UE support needed, no LOB            Goal status: MET   LONG TERM GOALS: Target date: 04/30/2022   Independent with advanced home/gym exercise program Baseline: attending [employer] 2-3x/wk and participating  with formal program Goal status: MET   2.  Patient will demonstrate score 50/56 Berg Balance Test to manifest low risk for falls Baseline: 46/56, at start of care; 51/56 at present Goal status: MET   3.  Demo improved safety with gait per 24/24 Dynamic Gait Index Baseline: 23/24 at end of care Goal status: MET     ASSESSMENT:   CLINICAL IMPRESSION: Progressed with POC details and demo improved score Berg Balance Test from initial 46/56 to 51/56 and 23/24 DGI. Main difficulty being single limb stance and tandem stance especially when LLE is biased. Difficulty with retrowalking demonstrating step-to pattern with LLE, difficulty in hip extension beyond neutral. Pt reports improved balance as ability to perform dishwasher loading/unloading without LOB. Patient is attending structured [employer] program for diet/exercise instruction and will continue with this community program. D/C to HEP     OBJECTIVE IMPAIRMENTS decreased balance, decreased coordination, and difficulty walking.    ACTIVITY LIMITATIONS cleaning, community activity, and meal prep.    PERSONAL FACTORS Age, Past/current experiences, and Time since onset of injury/illness/exacerbation are also affecting patient's functional outcome.      REHAB POTENTIAL: Excellent   CLINICAL DECISION MAKING: Stable/uncomplicated   EVALUATION COMPLEXITY: Low   PLAN: PT FREQUENCY: 1x/week   PT DURATION: 6 weeks   PLANNED INTERVENTIONS: Therapeutic exercises, Therapeutic activity, Neuromuscular re-education, Balance training, Gait training, Patient/Family education, Joint mobilization, Vestibular training, Spinal manipulation, Spinal mobilization, Taping, and Manual therapy   PLAN FOR NEXT SESSION: D/C summary    10:11 AM, 04/23/22 M. Sherlyn Lees, PT, DPT Physical Therapist- [place] Office Number: [phone]

## 2022-04-30 ENCOUNTER — Ambulatory Visit: Payer: PPO

## 2022-05-01 NOTE — Progress Notes (Signed)
[employer] PREP Weekly Session  Patient Details  Name: Joel Webb MRN: [id number] Date of Birth: [birth year] Age: 76 y.o. PCP: Antony Contras, MD  Vitals:   04/29/22 1300  Weight: 185 lb (83.9 kg)     [facility] Weekly seesion - 05/01/22 1600       [facility] "PREP" Location   [facility]PREP" [facility]      Weekly Session   Topic Discussed Expectations and non-scale victories    Minutes exercised this week 75 minutes    Classes attended to date [place] 05/01/2022, 4:00 PM

## 2022-05-06 NOTE — Progress Notes (Signed)
[facility] PREP Weekly Session  Patient Details  Name: Joel Webb MRN: [id number] Date of Birth: November 10, 1946 Age: 76 y.o. PCP: Antony Contras, MD  Vitals:   05/06/22 1300  Weight: 184 lb 9.6 oz (83.7 kg)     [facility] Weekly seesion - 05/06/22 1900       [facility] "PREP" Location   [facility] "PREP" Location [name]      Weekly Session   Topic Discussed Other   Portion size   Minutes exercised this week 80 minutes    Classes attended to date 81             [place] 05/06/2022, 7:42 PM

## 2022-05-13 DIAGNOSIS — F341 Dysthymic disorder: Secondary | ICD-10-CM | POA: Diagnosis not present

## 2022-05-13 NOTE — Progress Notes (Signed)
[facility] PREP Weekly Session  Patient Details  Name: FROYLAN MAROLT MRN: [id number] Date of Birth: [birth year] Age: 76 y.o. PCP: Tally Joe, MD  Vitals:   05/13/22 1450  Weight: 185 lb 12.8 oz (84.3 kg)     [facility] Weekly seesion - 05/13/22 1400       [facility] "PREP" Location   [facility] "PREP" Location [name]      Weekly Session   Topic Discussed Finding support    Minutes exercised this week 100 minutes    Classes attended to date 67             Pam Jerral Bonito 05/13/2022, 2:51 PM

## 2022-05-14 ENCOUNTER — Ambulatory Visit: Payer: PPO | Admitting: Endocrinology

## 2022-05-27 DIAGNOSIS — H04213 Epiphora due to excess lacrimation, bilateral lacrimal glands: Secondary | ICD-10-CM | POA: Diagnosis not present

## 2022-05-27 DIAGNOSIS — H16223 Keratoconjunctivitis sicca, not specified as Sjogren's, bilateral: Secondary | ICD-10-CM | POA: Diagnosis not present

## 2022-05-27 NOTE — Progress Notes (Signed)
Cardiology Office Note:    Date:  05/28/2022   ID:  Joel Webb, DOB May 17, 1946, MRN [id number]  PCP:  Antony Contras, MD   [place] Providers Cardiologist:  None     Referring MD: Antony Contras, MD   No chief complaint on file.   History of Present Illness:    Joel Webb is a 76 y.o. male with a hx of hypertension, hyperlipidemia, CKD III, diabetes mellitus, and GERD, here for follow up. He was initially seen 01/2022 for post hospital follow-up of near syncope and PVC's. He presented to the ED 11/02/2021 following a near-syncopal episode with prodrome of lightheadedness and general weakness while walking outside. EMS found him hypotensive in the 33s. He felt better after 400 cc of fluid by EMS. His amlodipine and olmesartan had recently been increased 2 weeks and a few days prior.He also had not been eating well that day. He was noted to have frequent PVCs on telemetry. Outpatient follow-up with cardiology was recommended.   When he was at the ED, he was unable to feel any palpitations that were recorded by telemetry. He confirms olmesartan was continued, amlodipine was reduced to 5 mg, and HCTZ was discontinued. At follow up he was feeling better and blood pressures were controlled. He was referred to PREP. He wore a monitor that showed rare PAC's and PVC's and up to 6 beats of SVT.  Today, he states he is feeling good aside from dry eyes which have bothered him since earlier this year when he underwent eye surgery. At home his blood pressure is usually averaging 137/80's. This morning his blood pressure was 126/90's. He denies any presyncopal symptoms. For exercise he has been participating in the PREP program. He believes the strength training is helping him the most, especially with his balance. He is doing well with exercise but continues to struggle with dietary changes. He admits to enjoying sweets too frequently. He is trying to have more protein in the mornings. Of note  he is currently on Trulicity. He denies any palpitations, chest pain, shortness of breath, or peripheral edema. No lightheadedness, headaches, orthopnea, or PND.   Past Medical History:  Diagnosis Date   CKD (chronic kidney disease), stage III ([place])    Depression    Diabetes mellitus without complication ([place])    ED (erectile dysfunction)    GERD (gastroesophageal reflux disease)    Hyperlipidemia    Hypertension    MCI (mild cognitive impairment)    Plantar fasciitis    Prostatitis    PVC's (premature ventricular contractions) 01/28/2022    Past Surgical History:  Procedure Laterality Date   CIRCUMCISION     COLONOSCOPY     MOUTH SURGERY  01/2020    Current Medications: Current Meds  Medication Sig   amLODipine (NORVASC) 5 MG tablet Take 5 mg by mouth daily.   atorvastatin (LIPITOR) 80 MG tablet Take 80 mg by mouth daily.   BD INSULIN SYRINGE U/F 31G X 5/16" 1 ML MISC USE TO GIVE 2 INSULIN INJECTIONS DAILY AS DIRECTED BY YOUR DOCTOR   Blood Glucose Monitoring Suppl (ACCU-CHEK GUIDE) w/Device KIT Use to check blood sugar once a day   DULoxetine (CYMBALTA) 60 MG capsule 60 mg daily.   finasteride (PROSCAR) 5 MG tablet    glucose blood (ACCU-CHEK GUIDE) test strip Use as instructed to check blood sugar once a day dx code E11.65   glucose blood (ONETOUCH VERIO) test strip Use to check blood sugar once a day  Insulin Pen Needle 32G X 4 MM MISC Use on insulin pen   Multiple Vitamin (MULTIVITAMIN WITH MINERALS) TABS tablet Take 1 tablet by mouth daily.   NOVOLIN R 100 UNIT/ML injection INJECT 0.08 MLS(8 UNITS TOTAL) INTO THE SKIN TWICE DAILY BEFORE A MEAL; ALSO INJECT 10 UNITS UNDER THE SKIN ONCE DAILY AT SUPPER (Patient taking differently: 6units am, 8-10units lunch, 12 units at supper)   olmesartan (BENICAR) 20 MG tablet TAKE ONE TABLET BY MOUTH DAILY; (REPLACING BENICAR HCT-DOCTOR KUMAR)   Omega-3 Fatty Acids (FISH OIL) 1000 MG CAPS Take by mouth. 2x/day   omeprazole (PRILOSEC)  20 MG capsule Take 20 mg by mouth every other day. PRN   OneTouch Delica Lancets 88C MISC Use to check blood sugar once a day   tamsulosin (FLOMAX) 0.4 MG CAPS capsule Take 0.4 mg by mouth. Reported on 05/29/2016   TRESIBA FLEXTOUCH 200 UNIT/ML FlexTouch Pen INJECT 40 UNITS UNDER THE SKIN DAILY   TRULICITY 1.5 ZY/6.0YT SOPN INJECT 1.5 MG UNDER THE SKIN ONCE WEEKLY     Allergies:   Patient has no known allergies.   Social History   Socioeconomic History   Marital status: Married    Spouse name: Joel Webb   Number of children: 1   Years of education: Not on file   Highest education level: Bachelor's degree (e.g., BA, AB, BS)  Occupational History    Comment: sales rep, retired  Tobacco Use   Smoking status: Former    Types: Cigarettes    Quit date: 12/20/2008    Years since quitting: 13.4   Smokeless tobacco: Never  Substance and Sexual Activity   Alcohol use: Yes    Comment: rarely   Drug use: No   Sexual activity: Not on file  Other Topics Concern   Not on file  Social History Narrative   01/26/20 Lives with wife   Caffeine, none   Social Determinants of Health   Financial Resource Strain: Not on file  Food Insecurity: Not on file  Transportation Needs: Not on file  Physical Activity: Not on file  Stress: Not on file  Social Connections: Not on file     Family History: The patient's family history includes Diabetes in his father; Heart disease in his father; Hepatitis C in his mother; Mesothelioma in his father.  ROS:   Please see the history of present illness.    (+) Dry eyes All other systems reviewed and are negative.  EKGs/Labs/Other Studies Reviewed:    The following studies were reviewed today:  Monitor 02/2022: 2 Day Zio Monitor   Quality: Fair.  Baseline artifact. Predominant rhythm: sinus rhythm First degree AV block Average heart rate: 75 bpm Max heart rate: 135 bpm Min heart rate: 51 bpm Pauses >2.5 seconds: none   Up to 6 beats SVT Rare PACs  and PVCs Ventricular bigeminy and trigeminy   EKG:   EKG is personally reviewed. 05/28/2022:  EKG was not ordered. 01/28/2022: Sinus rhythm. Rate 62 bpm.  Recent Labs: 03/13/2022: ALT 26; BUN 37; Creatinine, Ser 1.82; Hemoglobin 13.6; Platelets 145; Potassium 4.3; Sodium 135   Recent Lipid Panel    Component Value Date/Time   CHOL 129 12/24/2016 0804   TRIG 210.0 (H) 12/24/2016 0804   HDL 23.70 (L) 12/24/2016 0804   CHOLHDL 5 12/24/2016 0804   VLDL 42.0 (H) 12/24/2016 0804   LDLCALC 97 02/02/2016 0000   LDLDIRECT 107.0 01/08/2022 0813        Physical Exam:    Wt Readings  from Last 3 Encounters:  05/28/22 190 lb 11.2 oz (86.5 kg)  05/13/22 185 lb 12.8 oz (84.3 kg)  05/06/22 184 lb 9.6 oz (83.7 kg)     VS:  BP 132/84 (BP Location: Right Arm, Patient Position: Sitting, Cuff Size: Normal)   Pulse 74   Ht $R'5\' 9"'Fr$  (1.753 m)   Wt 190 lb 11.2 oz (86.5 kg)   SpO2 98%   BMI 28.16 kg/m  , BMI Body mass index is 28.16 kg/m. GENERAL:  Well appearing HEENT: Pupils equal round and reactive, fundi not visualized, oral mucosa unremarkable NECK:  No jugular venous distention, waveform within normal limits, carotid upstroke brisk and symmetric, no bruits, no thyromegaly LUNGS:  Clear to auscultation bilaterally HEART:  RRR.  PMI not displaced or sustained,S1 and S2 within normal limits, no S3, no S4, no clicks, no rubs, no murmurs ABD:  Flat, positive bowel sounds normal in frequency in pitch, no bruits, no rebound, no guarding, no midline pulsatile mass, no hepatomegaly, no splenomegaly EXT:  2 plus pulses throughout, trace LE edema, no cyanosis no clubbing SKIN:  No rashes no nodules NEURO:  Cranial nerves II through XII grossly intact, motor grossly intact throughout PSYCH:  Cognitively intact, oriented to person place and time   ASSESSMENT:    1. Essential hypertension   2. Hyperlipidemia, unspecified hyperlipidemia type   3. Therapeutic drug monitoring   4. Mixed hyperlipidemia    5. PVC's (premature ventricular contractions)     PLAN:    Essential hypertension Blood pressures are very mildly elevated.  Ideally his blood pressure be less than 130/80.  However he has had issues with near syncope in the setting of low blood pressures in the past.  Therefore we will be okay with a blood pressure of less than 140/90 for now.  Continue working on diet and exercise.  Continue limiting sodium.  No changes to his current regimen of amlodipine and olmesartan.  Mixed hyperlipidemia Given his diabetes his LDL goal should be at least less than 100, ideally less than 70.  Keep working on diet and exercise.  He will come back for fasting lipids and a CMP.  Continue atorvastatin.  PVC's (premature ventricular contractions) Asymptomatic.  PVC burden was not very high on his monitor.   Disposition: FU with Whitten Andreoni C. Oval Linsey, MD, [facility] in 1 year.  Medication Adjustments/Labs and Tests Ordered: Current medicines are reviewed at length with the patient today.  Concerns regarding medicines are outlined above.   Orders Placed This Encounter  Procedures   Lipid panel   Comprehensive metabolic panel   No orders of the defined types were placed in this encounter.  Patient Instructions  Medication Instructions:  Your physician recommends that you continue on your current medications as directed. Please refer to the Current Medication list given to you today.   *If you need a refill on your cardiac medications before your next appointment, please call your pharmacy*   Lab Work: FASTING LP/CMET SOON  If you have labs (blood work) drawn today and your tests are completely normal, you will receive your results only by: [place] (if you have MyChart) OR A paper copy in the mail If you have any lab test that is abnormal or we need to change your treatment, we will call you to review the results.   Testing/Procedures: NONE   Follow-Up: At [facility], you and your  health needs are our priority.  As part of our continuing mission to provide you  with exceptional heart care, we have created designated Provider Care Teams.  These Care Teams include your primary Cardiologist (physician) and Advanced Practice Providers (APPs -  Physician Assistants and Nurse Practitioners) who all work together to provide you with the care you need, when you need it.  We recommend signing up for the patient portal called "MyChart".  Sign up information is provided on this After Visit Summary.  MyChart is used to connect with patients for Virtual Visits (Telemedicine).  Patients are able to view lab/test results, encounter notes, upcoming appointments, etc.  Non-urgent messages can be sent to your provider as well.   To learn more about what you can do with MyChart, go to [email or web address].    Your next appointment:   12 month(s)  The format for your next appointment:   In Person  Provider:   Skeet Latch, MD or Laurann Montana, NP{         I,Mathew Stumpf,acting as a scribe for Skeet Latch, MD.,have documented all relevant documentation on the behalf of Skeet Latch, MD,as directed by  Skeet Latch, MD while in the presence of Skeet Latch, MD.  I, [place] Oval Linsey, MD have reviewed all documentation for this visit.  The documentation of the exam, diagnosis, procedures, and orders on 05/28/2022 are all accurate and complete.  Signed, Skeet Latch, MD  05/28/2022 10:06 AM    [place]

## 2022-05-28 ENCOUNTER — Encounter (HOSPITAL_BASED_OUTPATIENT_CLINIC_OR_DEPARTMENT_OTHER): Payer: Self-pay | Admitting: Cardiovascular Disease

## 2022-05-28 ENCOUNTER — Ambulatory Visit (HOSPITAL_BASED_OUTPATIENT_CLINIC_OR_DEPARTMENT_OTHER): Payer: PPO | Admitting: Cardiovascular Disease

## 2022-05-28 VITALS — BP 132/84 | HR 74 | Ht 69.0 in | Wt 190.7 lb

## 2022-05-28 DIAGNOSIS — I493 Ventricular premature depolarization: Secondary | ICD-10-CM | POA: Diagnosis not present

## 2022-05-28 DIAGNOSIS — Z5181 Encounter for therapeutic drug level monitoring: Secondary | ICD-10-CM

## 2022-05-28 DIAGNOSIS — E782 Mixed hyperlipidemia: Secondary | ICD-10-CM | POA: Diagnosis not present

## 2022-05-28 DIAGNOSIS — I10 Essential (primary) hypertension: Secondary | ICD-10-CM

## 2022-05-28 DIAGNOSIS — E785 Hyperlipidemia, unspecified: Secondary | ICD-10-CM | POA: Diagnosis not present

## 2022-05-28 NOTE — Patient Instructions (Signed)
Medication Instructions:  Your physician recommends that you continue on your current medications as directed. Please refer to the Current Medication list given to you today.   *If you need a refill on your cardiac medications before your next appointment, please call your pharmacy*   Lab Work: FASTING LP/CMET SOON  If you have labs (blood work) drawn today and your tests are completely normal, you will receive your results only by: [place] (if you have MyChart) OR A paper copy in the mail If you have any lab test that is abnormal or we need to change your treatment, we will call you to review the results.   Testing/Procedures: NONE   Follow-Up: At [facility], you and your health needs are our priority.  As part of our continuing mission to provide you with exceptional heart care, we have created designated Provider Care Teams.  These Care Teams include your primary Cardiologist (physician) and Advanced Practice Providers (APPs -  Physician Assistants and Nurse Practitioners) who all work together to provide you with the care you need, when you need it.  We recommend signing up for the patient portal called "MyChart".  Sign up information is provided on this After Visit Summary.  MyChart is used to connect with patients for Virtual Visits (Telemedicine).  Patients are able to view lab/test results, encounter notes, upcoming appointments, etc.  Non-urgent messages can be sent to your provider as well.   To learn more about what you can do with MyChart, go to [email or web address].    Your next appointment:   12 month(s)  The format for your next appointment:   In Person  Provider:   Skeet Latch, MD or Laurann Montana, NP{

## 2022-05-28 NOTE — Assessment & Plan Note (Signed)
Blood pressures are very mildly elevated.  Ideally his blood pressure be less than 130/80.  However he has had issues with near syncope in the setting of low blood pressures in the past.  Therefore we will be okay with a blood pressure of less than 140/90 for now.  Continue working on diet and exercise.  Continue limiting sodium.  No changes to his current regimen of amlodipine and olmesartan.

## 2022-05-28 NOTE — Assessment & Plan Note (Signed)
Given his diabetes his LDL goal should be at least less than 100, ideally less than 70.  Keep working on diet and exercise.  He will come back for fasting lipids and a CMP.  Continue atorvastatin.

## 2022-05-28 NOTE — Assessment & Plan Note (Signed)
Asymptomatic.  PVC burden was not very high on his monitor.

## 2022-05-28 NOTE — Progress Notes (Signed)
[facility] PREP Weekly Session  Patient Details  Name: MAKELL CYR MRN: [id number] Date of Birth: 12/04/1945 Age: 76 y.o. PCP: Antony Contras, MD  Vitals:   05/27/22 1300  Weight: 189 lb 9.6 oz (86 kg)     [facility] Weekly seesion - 05/28/22 1600       [facility] "PREP" Location   [facility] "PREP" Location [name]      Weekly Session   Topic Discussed Calorie breakdown    Minutes exercised this week 170 minutes    Classes attended to date 37             [place] 05/28/2022, 4:38 PM

## 2022-05-30 ENCOUNTER — Ambulatory Visit: Payer: PPO | Admitting: Psychology

## 2022-05-30 ENCOUNTER — Encounter: Payer: Self-pay | Admitting: Psychology

## 2022-05-30 ENCOUNTER — Ambulatory Visit: Payer: PPO | Admitting: Sports Medicine

## 2022-05-30 VITALS — BP 138/67 | Ht 69.0 in | Wt 190.0 lb

## 2022-05-30 DIAGNOSIS — F330 Major depressive disorder, recurrent, mild: Secondary | ICD-10-CM

## 2022-05-30 DIAGNOSIS — M170 Bilateral primary osteoarthritis of knee: Secondary | ICD-10-CM | POA: Diagnosis not present

## 2022-05-30 DIAGNOSIS — G20 Parkinson's disease: Secondary | ICD-10-CM

## 2022-05-30 DIAGNOSIS — F329 Major depressive disorder, single episode, unspecified: Secondary | ICD-10-CM | POA: Insufficient documentation

## 2022-05-30 DIAGNOSIS — R4189 Other symptoms and signs involving cognitive functions and awareness: Secondary | ICD-10-CM

## 2022-05-30 DIAGNOSIS — G3184 Mild cognitive impairment, so stated: Secondary | ICD-10-CM | POA: Diagnosis not present

## 2022-05-30 DIAGNOSIS — G20C Parkinsonism, unspecified: Secondary | ICD-10-CM

## 2022-05-30 MED ORDER — METHYLPREDNISOLONE ACETATE 40 MG/ML IJ SUSP
40.0000 mg | Freq: Once | INTRAMUSCULAR | Status: AC
  Administered 2022-05-30: 40 mg via INTRA_ARTICULAR

## 2022-05-30 NOTE — Progress Notes (Addendum)
PCP: Antony Contras, MD  Subjective:   HPI: Patient is a 76 y.o. male here for knee pain.  Knee pain Patient reports several year history of bilateral knee pain which is worsening.  Right>left. Patient does report recurrent falls however has not fallen on his knees.  He reports anterior knee pain which worsens when sitting from standing and going up and down stairs.  He has tried Tylenol and ibuprofen for the pain which has not seem to help.  He does guard exercises at home.  Past Medical History:  Diagnosis Date   CKD (chronic kidney disease), stage III ([place])    Depression    Diabetes mellitus without complication ([place])    ED (erectile dysfunction)    GERD (gastroesophageal reflux disease)    Hyperlipidemia    Hypertension    MCI (mild cognitive impairment)    Plantar fasciitis    Prostatitis    PVC's (premature ventricular contractions) 01/28/2022    Current Outpatient Medications on File Prior to Visit  Medication Sig Dispense Refill   amLODipine (NORVASC) 5 MG tablet Take 5 mg by mouth daily.     atorvastatin (LIPITOR) 80 MG tablet Take 80 mg by mouth daily.     BD INSULIN SYRINGE U/F 31G X 5/16" 1 ML MISC USE TO GIVE 2 INSULIN INJECTIONS DAILY AS DIRECTED BY YOUR DOCTOR 200 each 5   Blood Glucose Monitoring Suppl (ACCU-CHEK GUIDE) w/Device KIT Use to check blood sugar once a day 1 kit 0   DULoxetine (CYMBALTA) 60 MG capsule 60 mg daily.     finasteride (PROSCAR) 5 MG tablet      glucose blood (ACCU-CHEK GUIDE) test strip Use as instructed to check blood sugar once a day dx code E11.65 100 each 2   glucose blood (ONETOUCH VERIO) test strip Use to check blood sugar once a day 100 each 2   Insulin Pen Needle 32G X 4 MM MISC Use on insulin pen 90 each 1   Multiple Vitamin (MULTIVITAMIN WITH MINERALS) TABS tablet Take 1 tablet by mouth daily.     NOVOLIN R 100 UNIT/ML injection INJECT 0.08 MLS(8 UNITS TOTAL) INTO THE SKIN TWICE DAILY BEFORE A MEAL; ALSO INJECT 10 UNITS UNDER THE  SKIN ONCE DAILY AT SUPPER (Patient taking differently: 6units am, 8-10units lunch, 12 units at supper) 10 mL 3   olmesartan (BENICAR) 20 MG tablet TAKE ONE TABLET BY MOUTH DAILY; (REPLACING BENICAR HCT-DOCTOR KUMAR) 90 tablet 1   Omega-3 Fatty Acids (FISH OIL) 1000 MG CAPS Take by mouth. 2x/day     omeprazole (PRILOSEC) 20 MG capsule Take 20 mg by mouth every other day. PRN     OneTouch Delica Lancets 53M MISC Use to check blood sugar once a day 100 each 2   tamsulosin (FLOMAX) 0.4 MG CAPS capsule Take 0.4 mg by mouth. Reported on 05/29/2016     TRESIBA FLEXTOUCH 200 UNIT/ML FlexTouch Pen INJECT 40 UNITS UNDER THE SKIN DAILY 18 mL 2   TRULICITY 1.5 IW/8.0HO SOPN INJECT 1.5 MG UNDER THE SKIN ONCE WEEKLY 2 mL 5   No current facility-administered medications on file prior to visit.    Past Surgical History:  Procedure Laterality Date   CIRCUMCISION     COLONOSCOPY     MOUTH SURGERY  01/2020    No Known Allergies  Social History   Socioeconomic History   Marital status: Married    Spouse name: [name]   Number of children: 1   Years of education: Not on  file   Highest education level: Bachelor's degree (e.g., BA, AB, BS)  Occupational History    Comment: sales rep, retired  Tobacco Use   Smoking status: Former    Types: Cigarettes    Quit date: 12/20/2008    Years since quitting: 13.4   Smokeless tobacco: Never  Substance and Sexual Activity   Alcohol use: Yes    Comment: rarely   Drug use: No   Sexual activity: Not on file  Other Topics Concern   Not on file  Social History Narrative   01/26/20 Lives with wife   Caffeine, none   Social Determinants of Health   Financial Resource Strain: Not on file  Food Insecurity: Not on file  Transportation Needs: Not on file  Physical Activity: Not on file  Stress: Not on file  Social Connections: Not on file  Intimate Partner Violence: Not on file    Family History  Problem Relation Age of Onset   Hepatitis C Mother     Mesothelioma Father    Diabetes Father    Heart disease Father     BP 138/67   Ht $R'5\' 9"'MB$  (1.753 m)   Wt 190 lb (86.2 kg)   BMI 28.06 kg/m       No data to display              No data to display          Review of Systems: See HPI above.     Objective:  Physical Exam:  Gen: NAD, comfortable in exam room  Knee: - Inspection: no gross deformity. No swelling/effusion, erythema or bruising. Skin intact - Palpation: no TTP - ROM: full active ROM with flexion and extension in knee and hip - Strength: 5/5 strength - Neuro/vasc: NV intact - Special Tests: - LIGAMENTS: negative anterior and posterior drawer, negative Lachman's, no MCL or LCL laxity  -- MENISCUS: negative McMurray's   Assessment & Plan:   1. Bilateral OA. Carried out bilateral methylprednisone injections today.  Patient consented for procedure. Recommend: -Quadriceps strengthening home exercises -Tylenol PRN -Follow-up with Dr. Micheline Chapman in 4 weeks if no improvement, will consider knee x-rays  PRE-OP DIAGNOSIS: Bilateral OA POST-OP DIAGNOSIS: Same  PROCEDURE: Bilateral joint injection Performing Physician: Lattie Haw Supervising Physician (if applicable): Lilia Argue  Steroid $Remove'40mg'hIUnyHu$ /mL methylprednisolone    Procedure: The area was prepped in the usual sterile manner. The needle  was inserted into the affected area and the steroid was injected.   There were no complications during this procedure.  Followup: The patient tolerated the procedure well without  complications.  Standard post-procedure care is explained and return  precautions are given.            Patient seen and evaluated with the resident.  I agree with the above plan of care.  Patient's knees injected with cortisone as above.  Follow-up in 4 weeks for reevaluation.  In the meantime, he will start home exercises specifically concentrating isometric quad strengthening.  We also discussed sit to stand exercises as a judge of quadricep  strength.  If symptoms persist at follow-up consider x-rays at that time.  Call with questions or concerns in the interim.

## 2022-05-30 NOTE — Progress Notes (Addendum)
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 exceptionally low to average normative ranges. He did perform in the above average range across a task assessing safety and judgment.   While not directly assessed, receptive language abilities were believed to be intact. Likewise, Mr. Elman did not exhibit any difficulties comprehending task instructions and answered all questions asked of him appropriately. Assessed expressive language was mildly variable. Phonemic fluency was well below average to below average, semantic fluency was well below average, and confrontation naming was above average.     Assessed visuospatial/visuoconstructional abilities were average.    Learning (i.e., encoding) of novel verbal and visual information was variable, ranging from the well below average to above average normative ranges. Spontaneous delayed recall (i.e., retrieval) of previously learned information was below average to average. Retention rates were 100% across a story learning task, 140% across a list learning task, 82% across a daily living task, 83% across a figure drawing task, and 50% across  a shape learning task. Performance across recognition tasks was average, suggesting evidence for information consolidation.   Results of emotional screening instruments suggested that recent symptoms of generalized anxiety were in the minimal to mild range, while symptoms of depression were within the mild range. A screening instrument assessing recent sleep quality suggested the presence of minimal sleep dysfunction.  Tables of Scores:   Note: This summary of test scores accompanies the interpretive report and should not be considered in isolation without reference to the appropriate sections in the text. Descriptors are based on appropriate normative data and may be adjusted based on clinical judgment. Terms such as "Within Normal Limits" and "Outside Normal Limits" are used when a more specific description of the test score cannot be determined. Descriptors refer to the current evalaution only.         Percentile - Normative Descriptor > 98 - Exceptionally High 91-97 - Well Above Average 75-90 - Above Average 25-74 - Average 9-24 - Below Average 2-8 - Well Below Average < 2 - Exceptionally Low          April 2021 April 2022 Current    Orientation:        Raw Score Raw Score Raw Score Percentile   NAB Orientation, Form 1 --- --- 28/29 --- ---         Cognitive Screening:        Raw Score Raw Score Raw Score Percentile   SLUMS: --- --- 27/30 --- ---         Intellectual Functioning:        Standard Score Standard Score Standard Score Percentile   Test of Premorbid Functioning: --- --- 108 70 Average         Memory:       NAB Memory Module, Form 1: Standard Score/ T Score Standard Score/ T Score  Standard Score/ T Score Percentile   Total Memory Index --- --- 89 23 Below Average  List Learning         Total Trials 1-3 17/36 (38) 15/36 (35) 17/36 (40) 16 Below Average    List B 2/12 (34) 2/12 (34) 3/12 (42) 21 Below Average    Short Delay Free Recall 5/12 (39) 4/12 (34) 5/12 (41)  18 Below Average    Long Delay Free Recall 5/12 (41) 6/12 (45) 7/12 (51) 54 Average    Retention Percentage --- --- 140 (61) 86 Above Average    Recognition Discriminability 0 5 8 (56) 73 Average  Shape Learning         Total Trials 1-3 --- --- 17/27 (56) 73 Average    Delayed Recall --- --- 4/9 (41) 18 Below Average    Retention Percentage --- --- 50 (35) 7 Well Below Average    Recognition Discriminability --- --- 5 (44) 27 Average  Story Learning         Immediate Recall 61/80 (50) 45/80 (35) 42/80 (34) 5 Well Below Average    Delayed Recall 33/40 (52) 24/40 (37) 24/40 (38) 12 Below Average    Retention Percentage --- --- 100 (56) 73 Average  Daily Living Memory         Immediate Recall 50/51 (74) 49/51 (69) 45/51 (57) 75 Above Average    Delayed Recall 16/17 (62) 16/17 (62) 14/17 (51) 54 Average    Retention Percentage --- --- 82 (48) 42 Average    Recognition Hits --- --- 9/10 (55) 69 Average          Raw Score (Scaled Score) Raw Score (Scaled Score) Raw Score (Scaled Score) Percentile   RBANS Figure Copy: 5 20/20 (14) 18/20 (10) 50 Average  RBANS Figure Recall: 14/20 (11) 17/20 (13) 15/20 (12) 75 Above Average         Attention/Executive Function:       Trail Making Test (TMT): T Score T Score Raw Score (T Score) Percentile     Part A 38 30 68 secs., 0 errors (31) 3 Well Below Average    Part B 42 42 182 secs., 1 error (35) 7 Well Below Average           Scaled Score Scaled Score Scaled Score Percentile   RBANS Coding: _0 Exceptionally Low         NAB Attention Module, Form 1: T Score T Score T Score Percentile     Digits Forward 40 49 45 31 Average    Digits Backwards 33 40 33 5 Well Below Average         D-KEFS Color-Word Interference Test: Raw Score (Scaled Score) Raw Score (Scaled Score) Raw Score (Scaled Score) Percentile     Color Naming --- --- 64 secs. (1) <1 Exceptionally Low    Word Reading --- --- 37 secs. (4) 2 Well Below Average    Inhibition ---  --- 140 secs. (1) <1 Exceptionally Low      Total Errors --- --- 9 errors (4) 2 Well Below Average    Inhibition/Switching --- --- 124 secs. (4) 2 Well Below Average      Total Errors --- --- 9 errors (4) 2 Well Below Average         D-KEFS Verbal Fluency Test: Raw Score (Scaled Score) Raw Score (Scaled Score) Raw Score (Scaled Score) Percentile     Letter Total Correct --- --- 24 (7) 16 Below Average  Category Total Correct --- --- 20 (5) 5 Well Below Average    Category Switching Total Correct --- --- 3 (1) <1 Exceptionally Low    Category Switching Accuracy --- --- 1 (1) <1 Exceptionally Low      Total Set Loss Errors --- --- 6 (5) 5 Well Below Average      Total Repetition Errors --- --- 8 (5) 5 Well Below Average         [place] Card Sorting Test: Raw Score Raw Score Raw Score Percentile     Categories (trials) --- --- 2 (64) >16 Within Normal Limits    Total Errors --- --- 26 31 Average    Perseverative Errors --- --- 14 42 Average    Non-Perseverative Errors --- --- 12 16 Below Average    Failure to Maintain Set --- --- 0 --- ---         NAB Executive Functions Module, Form 1: T Score T Score T Score Percentile     Judgment --- --- 58 79 Above Average         Language:       Verbal Fluency Test: T Score T Score Raw Score (T Score) Percentile     Phonemic Fluency (FAS) 48 34 24 (35) 7 Well Below Average    Animal Fluency 43 30 11 (31) 3 Well Below Average          NAB Language Module, Form 1: T Score T Score T Score Percentile     Naming 57 57 31/31 (59) 82 Above Average         Visuospatial/Visuoconstruction:        Raw Score Raw Score Raw Score Percentile   Clock Drawing: 7/10 10/10 9/10 --- Within Normal Limits          Scaled Score Scaled Score Scaled Score Percentile   WAIS-IV Block Design: --- --- 8 25 Average         Mood and Personality:        Raw Score Raw Score Raw Score Percentile   Geriatric Depression Scale: --- --- 12 --- Mild  Geriatric Anxiety  Scale: --- --- 10 --- Minimal    Somatic --- --- 3 --- Minimal    Cognitive --- --- 3 --- Mild    Affective --- --- 4 --- Mild         Additional Questionnaires:        Raw Score Raw Score Raw Score Percentile   PROMIS Sleep Disturbance Questionnaire: --- --- 17 --- None to Slight   Informed Consent and Coding/Compliance:   The current evaluation represents a clinical evaluation for the purposes previously outlined by the referral source and is in no way reflective of a forensic evaluation.   Mr. Jacome was provided with a verbal description of the nature and purpose of the present neuropsychological evaluation. Also reviewed were the foreseeable risks and/or discomforts and benefits of the procedure, limits of confidentiality, and mandatory reporting requirements of this provider. The patient was given the opportunity to ask questions and receive answers about the evaluation. Oral consent to participate was provided by the patient.   This evaluation was conducted by Christia Reading, Ph.D., ABPP-CN, board certified clinical neuropsychologist. Mr. Tozzi completed a clinical interview with Dr. Melvyn Novas, billed as one unit [phone], and 135 minutes of cognitive testing and scoring, billed as one unit [phone] and four additional units 96139. Psychometrist Milana Kidney, B.S., assisted Dr. Melvyn Novas with test administration and scoring procedures. As a  separate and discrete service, Dr. Melvyn Novas spent a total of 160 minutes in interpretation and report writing billed as one unit [phone] and two units [id number].

## 2022-05-30 NOTE — Progress Notes (Signed)
   Psychometrician Note   Cognitive testing was administered to Joel Webb by Milana Kidney, B.S. (psychometrist) under the supervision of Dr. Christia Reading, Ph.D., licensed psychologist on 05/30/2022. Joel Webb did not appear overtly distressed by the testing session per behavioral observation or responses across self-report questionnaires. Rest breaks were offered.    The battery of tests administered was selected by Dr. Christia Reading, Ph.D. with consideration to Joel Webb current level of functioning, the nature of his symptoms, emotional and behavioral responses during interview, level of literacy, observed level of motivation/effort, and the nature of the referral question. This battery was communicated to the psychometrist. Communication between Dr. Christia Reading, Ph.D. and the psychometrist was ongoing throughout the evaluation and Dr. Christia Reading, Ph.D. was immediately accessible at all times. Dr. Christia Reading, Ph.D. provided supervision to the psychometrist on the date of this service to the extent necessary to assure the quality of all services provided.    Joel Webb will return within approximately 1-2 weeks for an interactive feedback session with Dr. Melvyn Novas at which time his test performances, clinical impressions, and treatment recommendations will be reviewed in detail. Joel Webb understands he can contact our office should he require our assistance before this time.  A total of 135 minutes of billable time were spent face-to-face with Joel Webb by the psychometrist. This includes both test administration and scoring time. Billing for these services is reflected in the clinical report generated by Dr. Christia Reading, Ph.D.  This note reflects time spent with the psychometrician and does not include test scores or any clinical interpretations made by Dr. Melvyn Novas. The full report will follow in a separate note.

## 2022-05-31 ENCOUNTER — Encounter: Payer: Self-pay | Admitting: Psychology

## 2022-06-04 DIAGNOSIS — I10 Essential (primary) hypertension: Secondary | ICD-10-CM | POA: Diagnosis not present

## 2022-06-04 DIAGNOSIS — Z5181 Encounter for therapeutic drug level monitoring: Secondary | ICD-10-CM | POA: Diagnosis not present

## 2022-06-04 DIAGNOSIS — E785 Hyperlipidemia, unspecified: Secondary | ICD-10-CM | POA: Diagnosis not present

## 2022-06-04 DIAGNOSIS — F341 Dysthymic disorder: Secondary | ICD-10-CM | POA: Diagnosis not present

## 2022-06-05 LAB — LIPID PANEL
Chol/HDL Ratio: 5.5 ratio — ABNORMAL HIGH (ref 0.0–5.0)
Cholesterol, Total: 181 mg/dL (ref 100–199)
HDL: 33 mg/dL — ABNORMAL LOW (ref >39)
LDL Chol Calc (NIH): 120 mg/dL — ABNORMAL HIGH (ref 0–99)
Triglycerides: 158 mg/dL — ABNORMAL HIGH (ref 0–149)
VLDL Cholesterol Cal: 28 mg/dL (ref 5–40)

## 2022-06-05 LAB — COMPREHENSIVE METABOLIC PANEL
ALT: 15 IU/L (ref 0–44)
AST: 12 IU/L (ref 0–40)
Albumin/Globulin Ratio: 1.7 (ref 1.2–2.2)
Albumin: 4 g/dL (ref 3.8–4.8)
Alkaline Phosphatase: 123 IU/L — ABNORMAL HIGH (ref 44–121)
BUN/Creatinine Ratio: 18 (ref 10–24)
BUN: 26 mg/dL (ref 8–27)
Bilirubin Total: 0.4 mg/dL (ref 0.0–1.2)
CO2: 21 mmol/L (ref 20–29)
Calcium: 9.8 mg/dL (ref 8.6–10.2)
Chloride: 101 mmol/L (ref 96–106)
Creatinine, Ser: 1.46 mg/dL — ABNORMAL HIGH (ref 0.76–1.27)
Globulin, Total: 2.4 g/dL (ref 1.5–4.5)
Glucose: 196 mg/dL — ABNORMAL HIGH (ref 70–99)
Potassium: 4.7 mmol/L (ref 3.5–5.2)
Sodium: 139 mmol/L (ref 134–144)
Total Protein: 6.4 g/dL (ref 6.0–8.5)
eGFR: 50 mL/min/{1.73_m2} — ABNORMAL LOW (ref >59)

## 2022-06-06 ENCOUNTER — Encounter: Payer: Self-pay | Admitting: Diagnostic Neuroimaging

## 2022-06-06 ENCOUNTER — Encounter: Payer: Self-pay | Admitting: Psychology

## 2022-06-06 ENCOUNTER — Ambulatory Visit: Payer: PPO | Admitting: Psychology

## 2022-06-06 ENCOUNTER — Telehealth: Payer: Self-pay | Admitting: Diagnostic Neuroimaging

## 2022-06-06 DIAGNOSIS — I6381 Other cerebral infarction due to occlusion or stenosis of small artery: Secondary | ICD-10-CM

## 2022-06-06 DIAGNOSIS — G20 Parkinson's disease: Secondary | ICD-10-CM | POA: Diagnosis not present

## 2022-06-06 DIAGNOSIS — R296 Repeated falls: Secondary | ICD-10-CM

## 2022-06-06 DIAGNOSIS — R269 Unspecified abnormalities of gait and mobility: Secondary | ICD-10-CM

## 2022-06-06 DIAGNOSIS — G3184 Mild cognitive impairment, so stated: Secondary | ICD-10-CM

## 2022-06-06 NOTE — Telephone Encounter (Signed)
Pt's wife has called to try and schedule an appointment since pt has recently completed his Neuro Eval.  Wife declined next available which is Feb of next year.  Wife is asking for a call as to what they should do, not wanting to wait until Feb for pt to be seen

## 2022-06-06 NOTE — Progress Notes (Signed)
   Neuropsychology Feedback Session Joel Webb. [facility]  Reason for Referral:   Joel Webb is a 76 y.o. right-handed Caucasian male referred by  Andrey Spearman, M.D. , to characterize his current cognitive functioning and assist with diagnostic clarity and treatment planning in the context of ongoing cognitive decline and concern for an underlying neurological process.   Feedback:   Joel Webb completed a comprehensive neuropsychological evaluation on 05/30/2022. Please refer to that encounter for the full report and recommendations. Briefly, results suggested frontal-subcortical dysfunction, evidenced by impairments surrounding processing speed, complex attention, and verbal fluency, with further variability across executive functioning and encoding (i.e., learning) aspects of memory. The etiology of ongoing dysfunction is unclear at the present time. However, I do feel it appropriate to raise concerns surrounding progressive supranuclear palsy (PSP). Joel Webb recent brain MRI revealed potential midbrain thinning, which is certainly a risk factor for this condition. Behaviorally, he appeared to exhibit a rigid gait, had a severely diminished eye blink rate, and demonstrated potential oculomotor dysfunction in the directions of up, left, and right. During interview, he and his wife reported concerns surrounding dysphagia (choking or other swallowing difficulties), as well as frequent falling behaviors and apathy. All of these observations and behaviors can reasonably be seen within this illness. Testing patterns suggesting ongoing frontal-subcortical dysfunction would also align well with this suspected etiology. As such, this remains plausible. I cannot rule out Parkinson's disease based upon current testing. However, I believe that PSP fits the aforementioned constellation of symptoms better. Joel Webb would benefit from an in-person  neurological exam to better assess these concerns.   Joel Webb was accompanied by his wife during the current feedback session. Content of the current session focused on the results of his neuropsychological evaluation. Joel Webb was given the opportunity to ask questions and his questions were answered. He was encouraged to reach out should additional questions arise. A copy of his report was provided at the conclusion of the visit.      42 minutes were spent conducting the current feedback session with Joel Webb, billed as one unit [phone].

## 2022-06-10 NOTE — Progress Notes (Signed)
Addendum to PREP final measurements Fit testing: Cardio march: 202-268 (improved gait) Sit to stand: 5 no arms, complete loss of balance to 11 arms down, did lose balance with frequency.  Bicep curl: 23 to 21 Balance: unable to do initially due to LOB, to improved double, maintained single and tandem betwn chairs with one hand on.

## 2022-06-10 NOTE — Progress Notes (Signed)
[facility] PREP Evaluation  Patient Details  Name: Joel Webb MRN: [id number] Date of Birth: [birth year] Age: 76 y.o. PCP: Joel Contras, MD  Vitals:   06/10/22 1333  BP: 100/60  Pulse: 95  SpO2: 95%  Weight: 186 lb 9.6 oz (84.6 kg)     [facility] Eval - 06/10/22 1300       [facility] "PREP" Location   [facility] "PREP" Location [name]      Referral    Referring Provider [facility] Start Date --   Last class day 06/05/22     Measurement   Waist Circumference 41 inches    Hip Circumference 39.5 inches    Body fat --   not calculated     Mobility and Daily Activities   I find it easy to walk up or down two or more flights of stairs. 2    I have no trouble taking out the trash. 4    I do housework such as vacuuming and dusting on my own without difficulty. 4    I can easily lift a gallon of milk (8lbs). 4    I can easily walk a mile. 2    I have no trouble reaching into high cupboards or reaching down to pick up something from the floor. 2    I do not have trouble doing out-door work such as Armed forces logistics/support/administrative officer, raking leaves, or gardening. 2      Mobility and Daily Activities   I feel younger than my age. 2    I feel independent. 3    I feel energetic. 1    I live an active life.  2    I feel strong. 3    I feel healthy. 3    I feel active as other people my age. 3      How fit and strong are you.   Fit and Strong Total Score 37            Past Medical History:  Diagnosis Date   Chronic kidney disease, stage 3 01/11/2021   ED (erectile dysfunction)    Essential hypertension 06/05/2017   Frequent falls    Gait abnormality    Gastroesophageal reflux disease 01/11/2021   Major depressive disorder    Microalbuminuria due to type 2 diabetes mellitus 05/25/2020   Mild cognitive impairment of uncertain or unknown etiology    Mixed hyperlipidemia 02/28/2016   Multiple lacunar infarcts    Plantar fasciitis    Prostatitis    Pure hypercholesterolemia 01/11/2021    PVC's (premature ventricular contractions) 01/28/2022   Uncontrolled type 2 diabetes mellitus with hyperglycemia, with long-term current use of insulin 02/28/2016   Past Surgical History:  Procedure Laterality Date   CIRCUMCISION     COLONOSCOPY     MOUTH SURGERY  01/2020   Social History   Tobacco Use  Smoking Status Former   Types: Cigarettes   Quit date: 12/20/2008   Years since quitting: 13.4  Smokeless Tobacco Never  Pt sts had follow up with MD and thoughts are he has a form of Parkinson's causing gait/difficulty talking at times Sts had f/u with cardiologist who said he was doing fine. Pt sts chol still high Pt sts sugars are doing okay but admits to not regularly checking them. Knows not to take metformin Has BP machine at home Has follow up with neuro Encouraged to continue to exercise and do balance work  Joel Webb 06/10/2022, 1:35 PM

## 2022-06-17 DIAGNOSIS — F341 Dysthymic disorder: Secondary | ICD-10-CM | POA: Diagnosis not present

## 2022-06-18 DIAGNOSIS — K219 Gastro-esophageal reflux disease without esophagitis: Secondary | ICD-10-CM | POA: Diagnosis not present

## 2022-06-18 DIAGNOSIS — E782 Mixed hyperlipidemia: Secondary | ICD-10-CM | POA: Diagnosis not present

## 2022-06-18 DIAGNOSIS — F339 Major depressive disorder, recurrent, unspecified: Secondary | ICD-10-CM | POA: Diagnosis not present

## 2022-06-18 DIAGNOSIS — Z7985 Long-term (current) use of injectable non-insulin antidiabetic drugs: Secondary | ICD-10-CM | POA: Diagnosis not present

## 2022-06-18 DIAGNOSIS — I10 Essential (primary) hypertension: Secondary | ICD-10-CM | POA: Diagnosis not present

## 2022-06-18 DIAGNOSIS — N1831 Chronic kidney disease, stage 3a: Secondary | ICD-10-CM | POA: Diagnosis not present

## 2022-06-18 DIAGNOSIS — Z9181 History of falling: Secondary | ICD-10-CM | POA: Diagnosis not present

## 2022-06-18 DIAGNOSIS — E1122 Type 2 diabetes mellitus with diabetic chronic kidney disease: Secondary | ICD-10-CM | POA: Diagnosis not present

## 2022-06-18 DIAGNOSIS — D696 Thrombocytopenia, unspecified: Secondary | ICD-10-CM | POA: Diagnosis not present

## 2022-06-18 DIAGNOSIS — G3184 Mild cognitive impairment, so stated: Secondary | ICD-10-CM | POA: Diagnosis not present

## 2022-06-18 DIAGNOSIS — Z794 Long term (current) use of insulin: Secondary | ICD-10-CM | POA: Diagnosis not present

## 2022-06-18 DIAGNOSIS — L821 Other seborrheic keratosis: Secondary | ICD-10-CM | POA: Diagnosis not present

## 2022-06-24 DIAGNOSIS — H16103 Unspecified superficial keratitis, bilateral: Secondary | ICD-10-CM | POA: Diagnosis not present

## 2022-06-24 DIAGNOSIS — H16223 Keratoconjunctivitis sicca, not specified as Sjogren's, bilateral: Secondary | ICD-10-CM | POA: Diagnosis not present

## 2022-06-27 ENCOUNTER — Ambulatory Visit
Admission: RE | Admit: 2022-06-27 | Discharge: 2022-06-27 | Disposition: A | Discharge status: Home / Self Care | Payer: PPO | Source: Ambulatory Visit | Attending: Sports Medicine | Admitting: Sports Medicine

## 2022-06-27 ENCOUNTER — Other Ambulatory Visit: Payer: Self-pay | Admitting: Sports Medicine

## 2022-06-27 ENCOUNTER — Ambulatory Visit: Payer: PPO | Admitting: Sports Medicine

## 2022-06-27 VITALS — BP 120/78 | Ht 69.0 in | Wt 190.0 lb

## 2022-06-27 DIAGNOSIS — M25562 Pain in left knee: Secondary | ICD-10-CM

## 2022-06-27 DIAGNOSIS — M1712 Unilateral primary osteoarthritis, left knee: Secondary | ICD-10-CM | POA: Diagnosis not present

## 2022-06-27 DIAGNOSIS — M25561 Pain in right knee: Secondary | ICD-10-CM

## 2022-06-27 DIAGNOSIS — M1711 Unilateral primary osteoarthritis, right knee: Secondary | ICD-10-CM | POA: Diagnosis not present

## 2022-06-27 NOTE — Progress Notes (Addendum)
   Subjective:    Patient ID: Joel Webb, male    DOB: 04-May-1946, 76 y.o.   MRN: [id number]  HPI  Joel Webb presents today for follow-up on bilateral knee pain.  Recent cortisone injections provided him with some pain relief.  Overall, he states he is about 20% better.  Continues to have pain primarily with going up and down stairs.  He has not noticed any significant swelling.  His pain is not bad enough that he feels the need to take any over-the-counter pain medication but he does typically use Aspercreme which is minimally beneficial.    Review of Systems As above    Objective:   Physical Exam  Well-developed, well-nourished.  No acute distress  Examination of both knees shows good range of motion.  No obvious effusion.  No significant crepitus.  No tenderness to palpation.  Negative McMurray's.  Neurovascularly intact distally.  X-rays of both knees including AP, lateral, and sunrise views show a paucity of degenerative changes.  Nothing acute      Assessment & Plan:   Bilateral knee pain likely secondary to very mild DJD  Reassurance regarding x-ray findings.  I would like for Joel Webb to try some formal physical therapy.  He may continue to use his Aspercreme as needed.  If he continues to have pain despite formal therapy then he will follow-up in the office for reevaluation.  We could consider merits of viscosupplementation if that is the case.  This note was dictated using Dragon naturally speaking software and may contain errors in syntax, spelling, or content which have not been identified prior to signing this note.

## 2022-06-27 NOTE — Addendum Note (Signed)
Addended by: Cyd Silence on: 06/27/2022 02:25 PM   Modules accepted: Orders

## 2022-07-01 DIAGNOSIS — F341 Dysthymic disorder: Secondary | ICD-10-CM | POA: Diagnosis not present

## 2022-07-02 ENCOUNTER — Ambulatory Visit: Payer: PPO | Admitting: Diagnostic Neuroimaging

## 2022-07-02 ENCOUNTER — Ambulatory Visit: Payer: PPO | Attending: Diagnostic Neuroimaging | Admitting: Physical Therapy

## 2022-07-02 ENCOUNTER — Encounter: Payer: Self-pay | Admitting: Diagnostic Neuroimaging

## 2022-07-02 VITALS — BP 151/95 | HR 71 | Ht 69.0 in | Wt 189.2 lb

## 2022-07-02 DIAGNOSIS — R2689 Other abnormalities of gait and mobility: Secondary | ICD-10-CM | POA: Insufficient documentation

## 2022-07-02 DIAGNOSIS — G20 Parkinson's disease: Secondary | ICD-10-CM

## 2022-07-02 DIAGNOSIS — M25561 Pain in right knee: Secondary | ICD-10-CM | POA: Insufficient documentation

## 2022-07-02 DIAGNOSIS — R2681 Unsteadiness on feet: Secondary | ICD-10-CM | POA: Diagnosis not present

## 2022-07-02 DIAGNOSIS — M6281 Muscle weakness (generalized): Secondary | ICD-10-CM | POA: Insufficient documentation

## 2022-07-02 DIAGNOSIS — M25562 Pain in left knee: Secondary | ICD-10-CM | POA: Insufficient documentation

## 2022-07-02 NOTE — Progress Notes (Signed)
[facility]  PATIENT: Joel Webb DOB: [birth year]  REFERRING CLINICIAN: Antony Contras, MD HISTORY FROM: patient  REASON FOR VISIT: follow up   HISTORICAL  CHIEF COMPLAINT:  Chief Complaint  Patient presents with   Follow-up    Pt states he is doing well.  He reports that his balance is off and is leaning more to the left than the right. Room 7 with wife    HISTORY OF PRESENT ILLNESS:   UPDATE (07/02/22, VRP): Since last visit, here for evaluation of gait difficulty, cognitive impairment, visual abnormalities.  Concern for progressive supranuclear palsy raised.  Wife concerned about patient's driving ability.  He agrees to stop driving today.  UPDATE (03/12/22, VRP): Since last visit, doing about the same. Symptoms are mainly balance issues, mood changes, memory changes, mainly noted per wife.   UPDATE (02/11/22, VRP): Since last visit, memory and balance issues continue. Neuropsych testing in 2022 showed similar issues; not clearly AD or FTD. Also had hypotension in Dec 2022 related to medication changes. More issues veering to the left (walking and driving).  UPDATE (10/16/20, VRP): Since last visit, patient feels stable.  He feels like his memory balance difficulties are stable.  Patient's wife is very concerned about progressive attention and focus difficulty, leaving the stove on, bumping into the wall on the left side, bruising his left arm, zoning out, not paying attention to her conversations.  Continues to have issues with marital discord.  UPDATE (06/12/20, VRP): Since last visit, doing well. Symptoms are improved. Severity is mild. No alleviating or aggravating factors. Tolerating meds. Memory and gait improved. Now exercising at [facility] 4x per week and feeling stronger.   UPDATE (02/28/20, Dr. Nicole Webb): "I met with Joel Webb to review the findings resulting from his neuropsychological evaluation. Since the last appointment, he has been about the  same. Time was spent reviewing the impressions and recommendations that are detailed in the evaluation report. I explained that int he present case, we do not have enough for a diagnosis of frank cognitive impairment and that I think depression, relational issues, and other more common reversible factors are the likely causes of Joel Webb difficulties. I counseled them about things to watch out for in FTD and was candid about the fact that the condition is often misdiagnosed as depression or relationship distress. We now have a strong baseline against which to compare Joel Webb future performance. Interventions provided during this encounter included psychoeducation, and other topics as reflected in the patient instructions. I took time to explain the findings and answer all the patient's questions. I encouraged Joel Webb to contact me should he have any further questions or if further follow up is desired."  UPDATE (01/26/20, VRP): Since last visit, doing about the same. Symptoms are persistent. Severity is moderate per patient. No alleviating or aggravating factors. Wife still notes memory and personality changes.  PRIOR HPI (12/22/19): 76 year old male here for evaluation of gait and balance difficulty.  History of diabetes, hypercholesterolemia, high blood pressure.  For past 6 to 8 months patient has had sensation of stumbling, tripping, numbness in feet, left foot incoordination.  Symptoms worsening over time.  He has more difficulty with steps and uneven surfaces.  He notices problem in his left foot.  Has had some back pain in the past.  Wife also notes at least 1 year of memory loss, personality and mood changes, irritable, paranoid, forgetting anniversary dates, forgetting medications and appointments, leaving stove on.  Patient and  wife had COVID in Jan 2021.    REVIEW OF SYSTEMS: Full 14 system review of systems performed and negative with exception of: As per HPI.  ALLERGIES: No  Known Allergies  HOME MEDICATIONS: Outpatient Medications Prior to Visit  Medication Sig Dispense Refill   amLODipine (NORVASC) 5 MG tablet Take 5 mg by mouth daily.     atorvastatin (LIPITOR) 80 MG tablet Take 80 mg by mouth daily.     BD INSULIN SYRINGE U/F 31G X 5/16" 1 ML MISC USE TO GIVE 2 INSULIN INJECTIONS DAILY AS DIRECTED BY YOUR DOCTOR 200 each 5   Blood Glucose Monitoring Suppl (ACCU-CHEK GUIDE) w/Device KIT Use to check blood sugar once a day 1 kit 0   DULoxetine (CYMBALTA) 60 MG capsule 60 mg daily.     finasteride (PROSCAR) 5 MG tablet      glucose blood (ACCU-CHEK GUIDE) test strip Use as instructed to check blood sugar once a day dx code E11.65 100 each 2   Insulin Pen Needle 32G X 4 MM MISC Use on insulin pen 90 each 1   Multiple Vitamin (MULTIVITAMIN WITH MINERALS) TABS tablet Take 1 tablet by mouth daily.     NOVOLIN R 100 UNIT/ML injection INJECT 0.08 MLS(8 UNITS TOTAL) INTO THE SKIN TWICE DAILY BEFORE A MEAL; ALSO INJECT 10 UNITS UNDER THE SKIN ONCE DAILY AT SUPPER (Patient taking differently: 6units am, 8-10units lunch, 12 units at supper) 10 mL 3   olmesartan (BENICAR) 20 MG tablet TAKE ONE TABLET BY MOUTH DAILY; (REPLACING BENICAR HCT-DOCTOR KUMAR) 90 tablet 1   Omega-3 Fatty Acids (FISH OIL) 1000 MG CAPS Take by mouth. 2x/day     omeprazole (PRILOSEC) 20 MG capsule Take 20 mg by mouth every other day. PRN     OneTouch Delica Lancets 71I MISC Use to check blood sugar once a day 100 each 2   tamsulosin (FLOMAX) 0.4 MG CAPS capsule Take 0.4 mg by mouth. Reported on 05/29/2016     TRESIBA FLEXTOUCH 200 UNIT/ML FlexTouch Pen INJECT 40 UNITS UNDER THE SKIN DAILY 18 mL 2   TRULICITY 1.5 RC/7.8LF SOPN INJECT 1.5 MG UNDER THE SKIN ONCE WEEKLY 2 mL 5   glucose blood (ONETOUCH VERIO) test strip Use to check blood sugar once a day 100 each 2   No facility-administered medications prior to visit.    PAST MEDICAL HISTORY: Past Medical History:  Diagnosis Date   Chronic kidney  disease, stage 3 01/11/2021   ED (erectile dysfunction)    Essential hypertension 06/05/2017   Frequent falls    Gait abnormality    Gastroesophageal reflux disease 01/11/2021   Major depressive disorder    Microalbuminuria due to type 2 diabetes mellitus 05/25/2020   Mild cognitive impairment of uncertain or unknown etiology    Mixed hyperlipidemia 02/28/2016   Multiple lacunar infarcts    Plantar fasciitis    Prostatitis    Pure hypercholesterolemia 01/11/2021   PVC's (premature ventricular contractions) 01/28/2022   Uncontrolled type 2 diabetes mellitus with hyperglycemia, with long-term current use of insulin 02/28/2016    PAST SURGICAL HISTORY: Past Surgical History:  Procedure Laterality Date   CIRCUMCISION     COLONOSCOPY     MOUTH SURGERY  01/2020    FAMILY HISTORY: Family History  Problem Relation Age of Onset   Hepatitis C Mother    Mesothelioma Father    Diabetes Father    Heart disease Father     SOCIAL HISTORY: Social History   Socioeconomic History  Marital status: Married    Spouse name: [name]   Number of children: 1   Years of education: 16   Highest education level: Bachelor's degree (e.g., BA, AB, BS)  Occupational History   Occupation: Retired    Comment: sales  Tobacco Use   Smoking status: Former    Types: Cigarettes    Quit date: 12/20/2008    Years since quitting: 13.5   Smokeless tobacco: Never  Substance and Sexual Activity   Alcohol use: Yes    Comment: rarely   Drug use: No   Sexual activity: Not on file  Other Topics Concern   Not on file  Social History Narrative   01/26/20 Lives with wife   Caffeine, none   Social Determinants of Health   Financial Resource Strain: Not on file  Food Insecurity: Not on file  Transportation Needs: Not on file  Physical Activity: Not on file  Stress: Not on file  Social Connections: Not on file  Intimate Partner Violence: Not on file     PHYSICAL EXAM  GENERAL  EXAM/CONSTITUTIONAL: Vitals:  Vitals:   07/02/22 1324  BP: (!) 151/95  Pulse: 71  Weight: 189 lb 4 oz (85.8 kg)  Height: 5' 9" (1.753 m)   Body mass index is 27.95 kg/m. Wt Readings from Last 3 Encounters:  07/02/22 189 lb 4 oz (85.8 kg)  06/27/22 190 lb (86.2 kg)  06/10/22 186 lb 9.6 oz (84.6 kg)   Patient is in no distress; well developed, nourished and groomed; neck is supple  CARDIOVASCULAR: Examination of carotid arteries is normal; no carotid bruits Regular rate and rhythm, no murmurs Examination of peripheral vascular system by observation and palpation is normal  EYES: Ophthalmoscopic exam of optic discs and posterior segments is normal; no papilledema or hemorrhages No results found.  MUSCULOSKELETAL: Gait, strength, tone, movements noted in Neurologic exam below  NEUROLOGIC: MENTAL STATUS:     02/11/2022   10:38 AM 10/16/2020    4:21 PM 01/26/2020   12:49 PM  MMSE - Mini Mental State Exam  Orientation to time _0 Orientation to Place _1 Registration _2 Attention/ Calculation _3 Recall _4 Language- name 2 objects _5 Language- repeat 1 0 1  Language- follow 3 step command _6 Language- read & follow direction _7 Write a sentence _8 Copy design _9 Total score _10 awake, alert, oriented to person recent and remote memory intact normal attention and concentration language fluent, comprehension intact, naming intact fund of knowledge appropriate  CRANIAL NERVE:  2nd - no papilledema on fundoscopic exam 2nd, 3rd, 4th, 6th - pupils equal and reactive to light, visual fields full to confrontation, extraocular muscles --> DECR UPGAZE AND DOWNGAZE  ON SMOOTH PURSUIT; SACCADIC BREAKDOWN OF SMOOTH PUSUIT ON LATERAL GAZE; no nystagmus 5th - facial sensation symmetric 7th - facial strength symmetric; OROLINGUAL DYSKINESIA; CHEWING ON INSIDE OF LEFT CHEEK 8th - hearing intact 9th - palate elevates symmetrically, uvula  midline 11th - shoulder shrug symmetric 12th - tongue protrusion midline DECR EYE BLINKING  MOTOR:  INCREASE TONE IN BUE normal bulk and tone, full strength in the BUE, BLE  SENSORY:  normal and symmetric to light touch  COORDINATION:  finger-nose-finger, fine finger movements normal  REFLEXES:  deep tendon reflexes TRACE and symmetric  GAIT/STATION:  narrow based gait;  STRAIGHT ARMS, DECR ARM SWING; FAST GAIT   DIAGNOSTIC DATA (LABS, IMAGING, TESTING) - I reviewed patient records, labs, notes, testing and imaging myself where available.  Lab Results  Component Value Date   WBC 8.4 03/13/2022   HGB 13.6 03/13/2022   HCT 40.5 03/13/2022   MCV 90.4 03/13/2022   PLT 145 (L) 03/13/2022      Component Value Date/Time   NA 139 06/04/2022 0850   K 4.7 06/04/2022 0850   CL 101 06/04/2022 0850   CO2 21 06/04/2022 0850   GLUCOSE 196 (H) 06/04/2022 0850   GLUCOSE 141 (H) 03/13/2022 1548   BUN 26 06/04/2022 0850   CREATININE 1.46 (H) 06/04/2022 0850   CALCIUM 9.8 06/04/2022 0850   PROT 6.4 06/04/2022 0850   ALBUMIN 4.0 06/04/2022 0850   AST 12 06/04/2022 0850   ALT 15 06/04/2022 0850   ALKPHOS 123 (H) 06/04/2022 0850   BILITOT 0.4 06/04/2022 0850   GFRNONAA 38 (L) 03/13/2022 1548   Lab Results  Component Value Date   CHOL 181 06/04/2022   HDL 33 (L) 06/04/2022   LDLCALC 120 (H) 06/04/2022   LDLDIRECT 107.0 01/08/2022   TRIG 158 (H) 06/04/2022   CHOLHDL 5.5 (H) 06/04/2022   Lab Results  Component Value Date   HGBA1C 7.6 (H) 01/08/2022   Lab Results  Component Value Date   PHXTAVWP79 480 12/22/2019   Lab Results  Component Value Date   TSH 1.71 02/02/2016    01/12/20 MRI brain [I reviewed images myself and agree with interpretation. -VRP]  - Mild atrophy and mild chronic small vessel ischemic disease. - No acute findings.  01/12/20 MRI cervical spine [I reviewed images myself and agree with interpretation. -VRP]  - At C3-4: disc bulging and facet  hypertrophy with mild spinal stenosis and severe biforaminal stenosis. - At C2-3: disc bulging and facet hypertrophy with moderate biforaminal stenosis.  02/23/22 MRI brain 1. No reversible cause for symptoms. 2. Stable cortical and cerebellar volume loss. Somewhat prominent midbrain thinning, are there symptoms of supranuclear palsy? 3. Interval lacunar infarct in the right putamen, but overall mild chronic small vessel ischemia for age.  03/04/22 PET brain  - No significant loss of cortical metabolism to suggest Alzheimer's type dementia. No evidence of frontotemporal dementia   ASSESSMENT AND PLAN  76 y.o. year old male here with mild gait and balance difficulty, incoordination, memory loss, personality mood changes.   Dx:  1. Parkinsonism, unspecified Parkinsonism type ([place])       PLAN:  GAIT DIFF / OCULOMOTOR DYSFUNCTION / FACIAL DYSKINESIA (possible PSP vs other atypical parkinsonism, MSA, neurodegenerative dz) - check DATscan - safety precautions reviewed - no driving; fall precautions - may consider empiric trial of carb/levo (patient would like to hold off for now)  MEMORY LOSS / BEHAVIOR / PERSONALITY changes (could be related to neurodegenerative dz; depression, marital issues, hearing loss also may be contributory) ->Mild Neurocognitive Disorder ("mild cognitive impairment").   MILD GAIT DIFFICULTY (some mild diabetic neuropathy; cannot rule out lumbar spine issues, but patient not interested in surgery) - continue PT for gait and balance training and fall recovery  Orders Placed This Encounter  Procedures   NM BRAIN DATSCAN TUMOR LOC INFLAM SPECT 1 DAY   Return in about 6 months (around 01/02/2023).       Penni Bombard, MD 1/65/5374, 8:27 PM Certified in Neurology, Neurophysiology and Neuroimaging  [facility] [address], [place], [place] [address] [phone]

## 2022-07-02 NOTE — Therapy (Signed)
OUTPATIENT PHYSICAL THERAPY LOWER EXTREMITY EVALUATION   Patient Name: Joel Webb MRN: [id number] DOB:09/24/1946, 76 y.o., male Today's Date: 07/02/2022   PT End of Session - 07/02/22 1152     Visit Number 1    Number of Visits 5   with eval   Date for PT Re-Evaluation 08/06/22    Authorization Type [employer]    PT Start Time 1152   pt late   PT Stop Time 1224    PT Time Calculation (min) 32 min    Activity Tolerance Patient tolerated treatment well;No increased pain    Behavior During Therapy [facility] for tasks assessed/performed             Past Medical History:  Diagnosis Date   Chronic kidney disease, stage 3 01/11/2021   ED (erectile dysfunction)    Essential hypertension 06/05/2017   Frequent falls    Gait abnormality    Gastroesophageal reflux disease 01/11/2021   Major depressive disorder    Microalbuminuria due to type 2 diabetes mellitus 05/25/2020   Mild cognitive impairment of uncertain or unknown etiology    Mixed hyperlipidemia 02/28/2016   Multiple lacunar infarcts    Plantar fasciitis    Prostatitis    Pure hypercholesterolemia 01/11/2021   PVC's (premature ventricular contractions) 01/28/2022   Uncontrolled type 2 diabetes mellitus with hyperglycemia, with long-term current use of insulin 02/28/2016   Past Surgical History:  Procedure Laterality Date   CIRCUMCISION     COLONOSCOPY     MOUTH SURGERY  01/2020   Patient Active Problem List   Diagnosis Date Noted   Multiple lacunar infarcts    Frequent falls    Gait abnormality    Major depressive disorder 05/30/2022   Mild cognitive impairment of uncertain or unknown etiology    PVC's (premature ventricular contractions) 01/28/2022   Pure hypercholesterolemia 01/11/2021   Gastroesophageal reflux disease 01/11/2021   Chronic kidney disease, stage 3 01/11/2021   Microalbuminuria due to type 2 diabetes mellitus 05/25/2020   Essential hypertension 06/05/2017   Uncontrolled type 2  diabetes mellitus with hyperglycemia, with long-term current use of insulin ([place]) 02/28/2016   Mixed hyperlipidemia 02/28/2016    PCP: Antony Contras, MD  REFERRING PROVIDER: Thurman Coyer, DO   ONSET DATE: 06/27/2022   REFERRING DIAG: M25.561,M25.562 (ICD-10-CM) - Acute pain of both knees   THERAPY DIAG:  Unsteadiness on feet  Other abnormalities of gait and mobility  Muscle weakness (generalized)  Rationale for Evaluation and Treatment Rehabilitation  SUBJECTIVE:   SUBJECTIVE STATEMENT: Pt reports chronic pain in his knees due to OA. Pt reports stairs are biggest aggravating factor for his pain. He stays in the basement a lot and therefore has to do stairs frequently.  Used to play golf and tennis, doesn't feel safe anymore since mild CVA and balance deficits  PERTINENT HISTORY: ***mild stroke in spring per MRI affecting L side, hard to multitask and think as quick    PAIN:  Are you having pain? No and Yes: NPRS scale: 4/10 Pain location: B knees R/L Pain description: achy pain Aggravating factors: stairs, getting up out of chairs Relieving factors: cortisone shot (1 month ago)  Pt not currently experiencing any pain but above describes when he does have pain.  PRECAUTIONS: None  WEIGHT BEARING RESTRICTIONS No  FALLS:  Has patient fallen in last 6 months? No  LIVING ENVIRONMENT: Lives with: lives with their spouse Lives in: House/apartment Stairs: Yes: Internal: 13 steps; on right going up and External:  4 steps; on right going up Has following equipment at home: Single point cane  OCCUPATION: retired, does a of computer work or reads, naps  PLOF: Independent with gait and Independent with transfers, occasional SPC use  PATIENT GOALS "to get where it doesn't hurt when I go up and down the steps"   OBJECTIVE:   DIAGNOSTIC FINDINGS:  IMPRESSION: (L knee xray 06/27/22) Minimal medial compartment osteoarthritis.  IMPRESSION: (R knee xray  06/27/22) Minimal medial and patellofemoral osteoarthritis.  PATIENT SURVEYS:  LEFS : 57/80 , 71.25% of normal function  COGNITION:  Overall cognitive status: Within functional limits for tasks assessed     SENSATION: Reports N/T in both feet that gets worse at night, neuropathy  EDEMA:  Pitting edema in BLE, wears compression stockings  POSTURE: rounded shoulders and forward head  PALPATION: No tenderness to touch  LOWER EXTREMITY MMT:  MMT Right eval Left eval  Hip flexion 5 5  Hip extension    Hip abduction 5 5  Hip adduction 5 5  Hip internal rotation    Hip external rotation    Knee flexion 5 4-  Knee extension 5 5  Ankle dorsiflexion 5 5  Ankle plantarflexion    Ankle inversion    Ankle eversion     (Blank rows = not tested)    FUNCTIONAL TESTS:    [facility] PT Assessment - 07/02/22 1202       Ambulation/Gait   Gait velocity 32.8' over 8.22 sec = 3.99 ft/sec   no AD     Standardized Balance Assessment   Standardized Balance Assessment Timed Up and Go Test;Five Times Sit to Stand    Five times sit to stand comments  20.13 sec   B hands on thighs     Timed Up and Go Test   TUG Normal TUG    Normal TUG (seconds) 11.31   no AD     Functional Gait  Assessment   Gait assessed  Yes    Gait Level Surface Walks 20 ft in less than 7 sec but greater than 5.5 sec, uses assistive device, slower speed, mild gait deviations, or deviates 6-10 in outside of the 12 in walkway width.    Change in Gait Speed Able to smoothly change walking speed without loss of balance or gait deviation. Deviate no more than 6 in outside of the 12 in walkway width.    Gait with Horizontal Head Turns Performs head turns smoothly with slight change in gait velocity (eg, minor disruption to smooth gait path), deviates 6-10 in outside 12 in walkway width, or uses an assistive device.    Gait with Vertical Head Turns Performs task with slight change in gait velocity (eg, minor disruption to  smooth gait path), deviates 6 - 10 in outside 12 in walkway width or uses assistive device    Gait and Pivot Turn Pivot turns safely in greater than 3 sec and stops with no loss of balance, or pivot turns safely within 3 sec and stops with mild imbalance, requires small steps to catch balance.    Step Over Obstacle Is able to step over one shoe box (4.5 in total height) without changing gait speed. No evidence of imbalance.    Gait with Narrow Base of Support Ambulates less than 4 steps heel to toe or cannot perform without assistance.    Gait with Eyes Closed Cannot walk 20 ft without assistance, severe gait deviations or imbalance, deviates greater than 15 in outside 12 in  walkway width or will not attempt task.   stumbles to the L   Ambulating Backwards Walks 20 ft, uses assistive device, slower speed, mild gait deviations, deviates 6-10 in outside 12 in walkway width.    Steps Alternating feet, must use rail.    Total Score 17    FGA comment: 17/30   high fall risk             GAIT: Distance walked: various clinic distances Assistive device utilized: None Level of assistance: Complete Independence Comments: WFL  TODAY'S TREATMENT: PT evaluation   PATIENT EDUCATION:  Education details: Eval findings, POC Person educated: Patient Education method: Explanation Education comprehension: verbalized understanding   HOME EXERCISE PROGRAM: To be established next session  ASSESSMENT:  CLINICAL IMPRESSION: Patient is a 76 year old male referred to Neuro OPPT for bilateral knee pain.   Pt's PMH is significant for: *** The following deficits were present during the exam: ***. Based on ***, pt is an incr risk for falls. Pt would benefit from skilled PT to address these impairments and functional limitations to maximize functional mobility independence    OBJECTIVE IMPAIRMENTS {opptimpairments:25111}.   ACTIVITY LIMITATIONS {activitylimitations:27494}  PARTICIPATION LIMITATIONS:  {participationrestrictions:25113}  PERSONAL FACTORS {Personal factors:25162} are also affecting patient's functional outcome.   REHAB POTENTIAL: {rehabpotential:25112}  CLINICAL DECISION MAKING: {clinical decision making:25114}  EVALUATION COMPLEXITY: {Evaluation complexity:25115}   GOALS: Goals reviewed with patient? Yes  STG = LTG due to length of POC   LONG TERM GOALS: Target date: 07/30/2022   ***Pt will be independent with *** HEP for improved strength, balance, transfers and gait.  Baseline:  Goal status: INITIAL  2.  ***Pt will improve FGA to *** for decreased fall risk   Baseline:  Goal status: INITIAL  3.  *** Baseline:  Goal status: INITIAL  4.  *** Baseline:  Goal status: INITIAL  5.  *** Baseline:  Goal status: INITIAL  6.  *** Baseline:  Goal status: {GOALSTATUS:25110}   PLAN: PT FREQUENCY: 1x/week  PT DURATION: 4 weeks  PLANNED INTERVENTIONS: Therapeutic exercises, Therapeutic activity, Neuromuscular re-education, Balance training, Gait training, Patient/Family education, Self Care, Joint mobilization, Joint manipulation, Stair training, DME instructions, Aquatic Therapy, Dry Needling, Electrical stimulation, Cryotherapy, Moist heat, Compression bandaging, Taping, Ultrasound, Biofeedback, Ionotophoresis '4mg'$ /ml Dexamethasone, Manual therapy, and Re-evaluation  PLAN FOR NEXT SESSION: ***balance (especially with eyes closed), LE strengthening in pain-free range   Excell Seltzer, PT, DPT, CSRS 07/02/2022, 4:53 PM

## 2022-07-03 ENCOUNTER — Telehealth: Payer: Self-pay | Admitting: Physical Therapy

## 2022-07-03 ENCOUNTER — Other Ambulatory Visit: Payer: Self-pay | Admitting: *Deleted

## 2022-07-03 DIAGNOSIS — R269 Unspecified abnormalities of gait and mobility: Secondary | ICD-10-CM

## 2022-07-03 DIAGNOSIS — G20C Parkinsonism, unspecified: Secondary | ICD-10-CM

## 2022-07-03 DIAGNOSIS — G20 Parkinson's disease: Secondary | ICD-10-CM

## 2022-07-03 DIAGNOSIS — R296 Repeated falls: Secondary | ICD-10-CM

## 2022-07-03 NOTE — Telephone Encounter (Signed)
Dr. Leta Baptist,  Joel Webb was evaluated by physical therapy on 07/02/2022 for acute bilateral knee pain.  However, the patient would benefit from a new PT evaluation for gait abnormalities and balance deficits as this was more present and limiting for the patient during the evaluation than the knee pain. Given your recent visit with the patient and his new diagnosis of Parkinsonism he would benefit more from PT with a focus on this rather than just the knee pain. If you agree, please place an order in [facility] workque in [facility] or fax the order to [phone].  Thank you, Excell Seltzer, PT, DPT, [facility] [address] [place], [name]  [address] Phone:  [phone] Fax:  [phone]

## 2022-07-08 ENCOUNTER — Encounter: Payer: Self-pay | Admitting: Diagnostic Neuroimaging

## 2022-07-08 NOTE — Telephone Encounter (Signed)
The order for the DAT scan has been sent to [facility] and they will call the patient to schedule. I made Joan's number the one to call.

## 2022-07-11 DIAGNOSIS — N403 Nodular prostate with lower urinary tract symptoms: Secondary | ICD-10-CM | POA: Diagnosis not present

## 2022-07-15 DIAGNOSIS — F341 Dysthymic disorder: Secondary | ICD-10-CM | POA: Diagnosis not present

## 2022-07-16 DIAGNOSIS — L821 Other seborrheic keratosis: Secondary | ICD-10-CM | POA: Diagnosis not present

## 2022-07-16 DIAGNOSIS — L820 Inflamed seborrheic keratosis: Secondary | ICD-10-CM | POA: Diagnosis not present

## 2022-07-16 DIAGNOSIS — D225 Melanocytic nevi of trunk: Secondary | ICD-10-CM | POA: Diagnosis not present

## 2022-07-16 DIAGNOSIS — X32XXXA Exposure to sunlight, initial encounter: Secondary | ICD-10-CM | POA: Diagnosis not present

## 2022-07-16 DIAGNOSIS — L570 Actinic keratosis: Secondary | ICD-10-CM | POA: Diagnosis not present

## 2022-07-16 DIAGNOSIS — Z1283 Encounter for screening for malignant neoplasm of skin: Secondary | ICD-10-CM | POA: Diagnosis not present

## 2022-07-17 ENCOUNTER — Telehealth (HOSPITAL_BASED_OUTPATIENT_CLINIC_OR_DEPARTMENT_OTHER): Payer: Self-pay | Admitting: *Deleted

## 2022-07-17 ENCOUNTER — Ambulatory Visit: Payer: PPO | Admitting: Physical Therapy

## 2022-07-17 DIAGNOSIS — I10 Essential (primary) hypertension: Secondary | ICD-10-CM

## 2022-07-17 DIAGNOSIS — M6281 Muscle weakness (generalized): Secondary | ICD-10-CM

## 2022-07-17 DIAGNOSIS — Z5181 Encounter for therapeutic drug level monitoring: Secondary | ICD-10-CM

## 2022-07-17 DIAGNOSIS — R2681 Unsteadiness on feet: Secondary | ICD-10-CM | POA: Diagnosis not present

## 2022-07-17 DIAGNOSIS — E785 Hyperlipidemia, unspecified: Secondary | ICD-10-CM

## 2022-07-17 DIAGNOSIS — R2689 Other abnormalities of gait and mobility: Secondary | ICD-10-CM

## 2022-07-17 MED ORDER — EZETIMIBE 10 MG PO TABS: 10.0000 mg | ORAL_TABLET | Freq: Every day | ORAL | 3 refills | Status: AC

## 2022-07-17 NOTE — Telephone Encounter (Signed)
Advised patient of lab results  Mailed lab orders to recheck  Rx sent to [facility]

## 2022-07-17 NOTE — Therapy (Signed)
OUTPATIENT PHYSICAL THERAPY NEURO TREATMENT   Patient Name: Joel Webb MRN: [id number] DOB:1946-11-09, 76 y.o., male Today's Date: 07/17/2022   PT End of Session - 07/17/22 1238     Visit Number 2    Number of Visits 5   with eval   Date for PT Re-Evaluation 07/30/22    Authorization Type [employer]    PT Start Time 9678   Previous pt session ran late   PT Stop Time 1317    PT Time Calculation (min) 42 min    Activity Tolerance Patient tolerated treatment well    Behavior During Therapy [facility] for tasks assessed/performed;Impulsive             Past Medical History:  Diagnosis Date   Chronic kidney disease, stage 3 01/11/2021   ED (erectile dysfunction)    Essential hypertension 06/05/2017   Frequent falls    Gait abnormality    Gastroesophageal reflux disease 01/11/2021   Major depressive disorder    Microalbuminuria due to type 2 diabetes mellitus 05/25/2020   Mild cognitive impairment of uncertain or unknown etiology    Mixed hyperlipidemia 02/28/2016   Multiple lacunar infarcts    Plantar fasciitis    Prostatitis    Pure hypercholesterolemia 01/11/2021   PVC's (premature ventricular contractions) 01/28/2022   Uncontrolled type 2 diabetes mellitus with hyperglycemia, with long-term current use of insulin 02/28/2016   Past Surgical History:  Procedure Laterality Date   CIRCUMCISION     COLONOSCOPY     MOUTH SURGERY  01/2020   Patient Active Problem List   Diagnosis Date Noted   Multiple lacunar infarcts    Frequent falls    Gait abnormality    Major depressive disorder 05/30/2022   Mild cognitive impairment of uncertain or unknown etiology    PVC's (premature ventricular contractions) 01/28/2022   Pure hypercholesterolemia 01/11/2021   Gastroesophageal reflux disease 01/11/2021   Chronic kidney disease, stage 3 01/11/2021   Microalbuminuria due to type 2 diabetes mellitus 05/25/2020   Essential hypertension 06/05/2017   Uncontrolled type 2  diabetes mellitus with hyperglycemia, with long-term current use of insulin ([place]) 02/28/2016   Mixed hyperlipidemia 02/28/2016    PCP: Antony Contras, MD  REFERRING PROVIDER: Penni Bombard, MD  ONSET DATE: 07/03/2022   REFERRING DIAG: G20 (ICD-10-CM) - Parkinsonism, unspecified Parkinsonism type ([place]); R29.6 (ICD-10-CM) - Frequent falls; R26.9 (ICD-10-CM) - Gait abnormality    THERAPY DIAG:  Unsteadiness on feet - Plan: PT plan of care cert/re-cert  Other abnormalities of gait and mobility - Plan: PT plan of care cert/re-cert  Muscle weakness (generalized) - Plan: PT plan of care cert/re-cert  Rationale for Evaluation and Treatment Rehabilitation  SUBJECTIVE:   SUBJECTIVE STATEMENT: Pt reports he is doing well, is inquiring about proper floor transfers since he "loses his balance all the time". Not having knee pain today and is having DaT Scan tomorrow.   PERTINENT HISTORY: diabetes, hypercholesterolemia, high blood pressure, Parkinsonism (suspected PSP)   PAIN:  Are you having pain? No  PRECAUTIONS: Fall  OCCUPATION: retired, does a of computer work or reads, naps  PLOF: Independent with gait and Independent with transfers, occasional SPC use  PATIENT GOALS "to get where it doesn't hurt when I go up and down the steps"   OBJECTIVE:   DIAGNOSTIC FINDINGS:  IMPRESSION: (L knee xray 06/27/22) Minimal medial compartment osteoarthritis.  IMPRESSION: (R knee xray 06/27/22) Minimal medial and patellofemoral osteoarthritis.  COGNITION:  Overall cognitive status: Impaired and Difficulty to assess due  to: no family present       TODAY'S TREATMENT: NMR  -Provided visual demonstration of proper floor transfer and had patient demonstrate for fall recovery per pt request. Pt frequently interrupting therapist and stating he "does this already" yet was unable to teach sequence back or demonstrate correctly. Lengthy discussion emphasizing importance of pt using a sturdy  chair or object for UE support if he is going from standing to tall kneel (he does this often to put leash on dog), as pt currently extending arms forward and collapsing onto ground with had impact on knees. "That is probably why my knees hurt". Educated pt on importance of not standing up from ground if he has fallen or hit his head, pt verbalized understanding. Practiced proper technique x1 w/S* and min cues to slow down, as pt very impulsive.   MCTSIB: Pt required mod-max multimodal cues throughout due to impulsive behavior and inability to follow verbal cues. Pt required max A when starting condition 3 due to poor foot clearance onto foam and slamming into wall caused by impulsive behavior and inattention.    Condition 1- 30s  Condition 2- 30s  Condition 3- 30s  Condition 4- 30s -noted minor A/P postural sway on condition 4, but no instability noted   In // bars for improved reactive balance strategies, narrow BOS and vestibular input for balance:  -Fwd tandem walking without UE support, down and back x4. Pt initially demonstrating crossover step rather than tandem foot placement but quickly was able to perform without LOB -On rockerboard in A/P direction, standing w/EO and no UE support x4 minutes. Noted overcompensation of balance (particularly anteriorly) but pt able to self-correct without need for UE support  -Progressed to rockerboard w/EC and pt unable to perform without mod A from therapist due to significant anterior LOB w/overcorrection. Pt seemingly unaware when he was losing balance, as he did not reach out for rail when he lost balance, requiring therapist to provide mod A to prevent fall. Provided min cues for pt to grab onto rail when he felt off balance and pt able to initiate use of UE support.   Ther Ex  SciFit multi-peaks level 6 for 8 minutes using BUE/BLEs for neural priming for reciprocal movement, dynamic cardiovascular conditioning and BLE strength. Pt very impulsive  getting onto/off of SciFit and required mod multimodal cues for proper sequencing to get on/off equipment   PATIENT EDUCATION:  Education details: Attempted to discuss PSP and rehab for PD. Pt refuses at this time as he states he "does not need it". Very difficult to educate pt due to cognitive impairments and wife not present for session.  Person educated: Patient Education method: Explanation Education comprehension: verbalized understanding and needs further education   HOME EXERCISE PROGRAM: Not provided today due to safety concerns at home. Will provide if wife present   ASSESSMENT:  CLINICAL IMPRESSION: Emphasis of skilled PT session on floor transfers, reactive balance strategies and vestibular input for balance. Pt extremely impulsive and requires mod-max multimodal cues for safety throughout session. Pt frequently losing balance anteriorly and had poor reactive balance strategies, requiring therapist to catch pt to prevent fall. HEP not provided as pt is impulsive and had poor safety awareness. Pt continues to decline knee pain, new referral obtained to address deficits 2/2 PSP but pt refusing therapy to address PD at this time. Continue POC.    OBJECTIVE IMPAIRMENTS Abnormal gait, decreased balance, increased edema, impaired perceived functional ability, and pain.   ACTIVITY LIMITATIONS  lifting, bending, squatting, stairs, and transfers  PARTICIPATION LIMITATIONS: cleaning, driving, community activity, and yard work  PERSONAL FACTORS 1-2 comorbidities:    diabetes, hypercholesterolemia, high blood pressure, and possible Parkinsonism are also affecting patient's functional outcome.   REHAB POTENTIAL: Fair due to impaired cognition and lack of family support  CLINICAL DECISION MAKING: Stable/uncomplicated  EVALUATION COMPLEXITY: Low   GOALS: Goals reviewed with patient? Yes  STG = LTG due to length of POC   LONG TERM GOALS: Target date: 07/30/2022   Pt will be  independent with final HEP for improved strength, balance, transfers and gait. Baseline:  Goal status: INITIAL  2.  Pt will improve FGA to 25/30 for decreased fall risk  Baseline: 17/30 (8/15) Goal status: INITIAL  3.  Pt will improve score on LEFS to 65/80 (81.25% of normal function) to demonstrate decreased disability level Baseline: 57/80 (8/15) Goal status: INITIAL   PLAN: PT FREQUENCY: 1x/week  PT DURATION: 4 weeks  PLANNED INTERVENTIONS: Therapeutic exercises, Therapeutic activity, Neuromuscular re-education, Balance training, Gait training, Patient/Family education, Self Care, Joint mobilization, Joint manipulation, Stair training, DME instructions, Aquatic Therapy, Dry Needling, Electrical stimulation, Cryotherapy, Moist heat, Compression bandaging, Taping, Ultrasound, Biofeedback, Ionotophoresis '4mg'$ /ml Dexamethasone, Manual therapy, and Re-evaluation  PLAN FOR NEXT SESSION: initiate HEP if wife present, balance (especially with eyes closed), unlevel surfaces, education on PD, LE strengthening in pain-free range, SciFit   Tayllor Breitenstein E Ajmal Kathan, PT, DPT 07/17/2022, 1:40 PM

## 2022-07-17 NOTE — Telephone Encounter (Signed)
-----   Message from Skeet Latch, MD sent at 06/16/2022  2:44 PM EDT ----- Cholesterol levels are above goal.  Recommend continuing atorvastatin and adding Zetia '10mg'$  daily.  Repeat lipids/CMP in 2-3 months.  Kidney function is abnormal but stable.

## 2022-07-18 DIAGNOSIS — R3914 Feeling of incomplete bladder emptying: Secondary | ICD-10-CM | POA: Diagnosis not present

## 2022-07-18 DIAGNOSIS — G239 Degenerative disease of basal ganglia, unspecified: Secondary | ICD-10-CM | POA: Diagnosis not present

## 2022-07-18 DIAGNOSIS — N403 Nodular prostate with lower urinary tract symptoms: Secondary | ICD-10-CM | POA: Diagnosis not present

## 2022-07-24 ENCOUNTER — Ambulatory Visit: Payer: PPO | Attending: Diagnostic Neuroimaging | Admitting: Physical Therapy

## 2022-07-24 ENCOUNTER — Telehealth: Payer: Self-pay | Admitting: Physical Therapy

## 2022-07-24 DIAGNOSIS — R2689 Other abnormalities of gait and mobility: Secondary | ICD-10-CM | POA: Diagnosis not present

## 2022-07-24 DIAGNOSIS — R2681 Unsteadiness on feet: Secondary | ICD-10-CM | POA: Insufficient documentation

## 2022-07-24 DIAGNOSIS — R293 Abnormal posture: Secondary | ICD-10-CM | POA: Insufficient documentation

## 2022-07-24 DIAGNOSIS — M6281 Muscle weakness (generalized): Secondary | ICD-10-CM | POA: Insufficient documentation

## 2022-07-24 NOTE — Telephone Encounter (Signed)
Dr. Leta Baptist,  [name] was re-evaluated by PT on 8/30/023 for balance impairments related to his Parkinsonism.  The patient was initially not agreeable to add sessions to his PT Plan of Care but is now agreeable and would benefit from a new PT evaluation for Parkinsonism and to address these impairments.  He requires a new referral to PT in order to add these visits to his Plan of Care.  If you agree, please place an order in [facility] workque in [facility] or fax the order to [phone].  Thank you, Excell Seltzer, PT, DPT, [facility] [address] [place], [place]  [address] Phone:  [phone] Fax:  [phone]

## 2022-07-24 NOTE — Therapy (Signed)
OUTPATIENT PHYSICAL THERAPY NEURO TREATMENT   Patient Name: Joel Webb MRN: [id number] DOB:08/16/1946, 76 y.o., male Today's Date: 07/24/2022   PT End of Session - 07/24/22 0932     Visit Number 3    Number of Visits 5   with eval   Date for PT Re-Evaluation 07/30/22    Authorization Type [employer]    PT Start Time 0931    PT Stop Time 1015    PT Time Calculation (min) 44 min    Equipment Utilized During Treatment Gait belt    Activity Tolerance Patient tolerated treatment well    Behavior During Therapy [facility] for tasks assessed/performed;Impulsive              Past Medical History:  Diagnosis Date   Chronic kidney disease, stage 3 01/11/2021   ED (erectile dysfunction)    Essential hypertension 06/05/2017   Frequent falls    Gait abnormality    Gastroesophageal reflux disease 01/11/2021   Major depressive disorder    Microalbuminuria due to type 2 diabetes mellitus 05/25/2020   Mild cognitive impairment of uncertain or unknown etiology    Mixed hyperlipidemia 02/28/2016   Multiple lacunar infarcts    Plantar fasciitis    Prostatitis    Pure hypercholesterolemia 01/11/2021   PVC's (premature ventricular contractions) 01/28/2022   Uncontrolled type 2 diabetes mellitus with hyperglycemia, with long-term current use of insulin 02/28/2016   Past Surgical History:  Procedure Laterality Date   CIRCUMCISION     COLONOSCOPY     MOUTH SURGERY  01/2020   Patient Active Problem List   Diagnosis Date Noted   Multiple lacunar infarcts    Frequent falls    Gait abnormality    Major depressive disorder 05/30/2022   Mild cognitive impairment of uncertain or unknown etiology    PVC's (premature ventricular contractions) 01/28/2022   Pure hypercholesterolemia 01/11/2021   Gastroesophageal reflux disease 01/11/2021   Chronic kidney disease, stage 3 01/11/2021   Microalbuminuria due to type 2 diabetes mellitus 05/25/2020   Essential hypertension 06/05/2017    Uncontrolled type 2 diabetes mellitus with hyperglycemia, with long-term current use of insulin ([place]) 02/28/2016   Mixed hyperlipidemia 02/28/2016    PCP: Antony Contras, MD  REFERRING PROVIDER: Penni Bombard, MD  ONSET DATE: 07/03/2022   REFERRING DIAG: G20 (ICD-10-CM) - Parkinsonism, unspecified Parkinsonism type ([place]); R29.6 (ICD-10-CM) - Frequent falls; R26.9 (ICD-10-CM) - Gait abnormality   THERAPY DIAG:  Unsteadiness on feet  Other abnormalities of gait and mobility  Muscle weakness (generalized)  Abnormal posture  Rationale for Evaluation and Treatment Rehabilitation  SUBJECTIVE:   SUBJECTIVE STATEMENT: Pt reports his DaT scan is scheduled for tomorrow. Pt reports no falls since last visit but has frequent LOB. Pt reports no pain currently, wearing a L forearm sleeve to protect his skin and remind him to not bump into stuff, per pt report. Pt reports he has been doing his HEP that he received from [facility] prior to his d/c from that clinic (squats, heel/toe raises, tandem stance with EO/EC), reports he does his HEP when home alone.  PERTINENT HISTORY: diabetes, hypercholesterolemia, high blood pressure, Parkinsonism (suspected PSP)   PAIN:  Are you having pain? No  PRECAUTIONS: Fall  OCCUPATION: retired, does a of computer work or reads, naps  PLOF: Independent with gait and Independent with transfers, occasional SPC use  PATIENT GOALS "to get where it doesn't hurt when I go up and down the steps"   OBJECTIVE:   DIAGNOSTIC FINDINGS:  IMPRESSION: (L knee xray 06/27/22) Minimal medial compartment osteoarthritis.  IMPRESSION: (R knee xray 06/27/22) Minimal medial and patellofemoral osteoarthritis.  COGNITION:  Overall cognitive status: Impaired and Difficulty to assess due to: no family present     TODAY'S TREATMENT: NMR  In // bars with no UE support unless otherwise noted:  -normal stance EC x 30 sec, no LOB -Romberg stance EC x 30 sec,  multidirectional postural sway with min A to recover (pt does reach for bar this time to regain balance, did not do this with previous session)  -modified tandem stance LLE fwd x 30 sec, LOB to the L  -modified tandem stance RLE fwd x 30 sec, no LOB  -tandem stance EO x 30 sec, fingertip support with mild postural sway  In // bars on airex with no UE support unless otherwise noted:  -normal stance EC x 30 sec, some postural sway but no LOB  -Romberg stance EC x 30 sec, LOB L>R, min A and UE on bar support to prevent LOB -modified tandem stance L/R x 30 sec each; LOB posteriorly and to the L, min A and UE on bar support to prevent LOB  Ther Ex  SciFit multi-peaks level 6 for 10 minutes using BUE/BLEs for neural priming for reciprocal movement, dynamic cardiovascular conditioning and BLE strength. Pt able to maintain 80 steps/min (+) during exercise, completes 815 steps total. Pt very impulsive getting onto/off of SciFit and required mod multimodal cues for proper sequencing to get on/off equipment. Pt rates his RPE as 2/10 following SciFit exercise.   PATIENT EDUCATION:  Education details: PSP and rehab for PD including changing PT POC to increase his frequency, importance of wife attending a PT session for training with HEP--pt should not be performing HEP items with EC when alone Person educated: Patient Education method: Explanation Education comprehension: verbalized understanding and needs further education   HOME EXERCISE PROGRAM: Not provided today due to safety concerns at home. Will provide if wife present    ASSESSMENT:  CLINICAL IMPRESSION: Emphasis of skilled PT session on continuing to educate pt on PSP diagnosis and functional implications. Also reviewed his ongoing balance impairments with session focus on challenging balance with various tasks in // bars as noted above. Pt appears more receptive/understanding of his PSP diagnosis this session and is agreeable to change plan  of care to address his balance deficits due to PSP. Telephone Encounter sent to referring provider for new referral to PT to change POC. Also educated pt to not perform HEP especially items with EC unless his wife is home and to wait until she can come in for PT session to observe how to safely guard him. Will wait to issue further HEP until wife present. Pt reports he can communicate plan of care change (need to schedule more appointments) to his wife as well as ask her to come in for a PT session. Continue POC.    OBJECTIVE IMPAIRMENTS Abnormal gait, decreased balance, increased edema, impaired perceived functional ability, and pain.   ACTIVITY LIMITATIONS lifting, bending, squatting, stairs, and transfers  PARTICIPATION LIMITATIONS: cleaning, driving, community activity, and yard work  PERSONAL FACTORS 1-2 comorbidities:    diabetes, hypercholesterolemia, high blood pressure, and possible Parkinsonism are also affecting patient's functional outcome.   REHAB POTENTIAL: Fair due to impaired cognition and lack of family support  CLINICAL DECISION MAKING: Stable/uncomplicated  EVALUATION COMPLEXITY: Low   GOALS: Goals reviewed with patient? Yes  STG = LTG due to length of POC  LONG TERM GOALS: Target date: 07/30/2022   Pt will be independent with final HEP for improved strength, balance, transfers and gait. Baseline:  Goal status: INITIAL  2.  Pt will improve FGA to 25/30 for decreased fall risk  Baseline: 17/30 (8/15) Goal status: INITIAL  3.  Pt will improve score on LEFS to 65/80 (81.25% of normal function) to demonstrate decreased disability level Baseline: 57/80 (8/15) Goal status: INITIAL   PLAN: PT FREQUENCY: 1x/week  PT DURATION: 4 weeks  PLANNED INTERVENTIONS: Therapeutic exercises, Therapeutic activity, Neuromuscular re-education, Balance training, Gait training, Patient/Family education, Self Care, Joint mobilization, Joint manipulation, Stair training, DME  instructions, Aquatic Therapy, Dry Needling, Electrical stimulation, Cryotherapy, Moist heat, Compression bandaging, Taping, Ultrasound, Biofeedback, Ionotophoresis '4mg'$ /ml Dexamethasone, Manual therapy, and Re-evaluation  PLAN FOR NEXT SESSION: initiate HEP if wife present, balance (especially with eyes closed), unlevel surfaces, education on PD/PSP, LE strengthening in pain-free range, SciFit, did referring provider send new referral so recert can be done to change frequency of visits--did pt schedule 2x/week for next 4 weeks?   Excell Seltzer, PT, DPT, [employer] 07/24/2022, 10:18 AM

## 2022-07-25 ENCOUNTER — Encounter (HOSPITAL_COMMUNITY)
Admission: RE | Admit: 2022-07-25 | Discharge: 2022-07-25 | Disposition: A | Discharge status: Home / Self Care | Payer: PPO | Source: Ambulatory Visit | Attending: Diagnostic Neuroimaging | Admitting: Diagnostic Neuroimaging

## 2022-07-25 DIAGNOSIS — G20 Parkinson's disease: Secondary | ICD-10-CM | POA: Insufficient documentation

## 2022-07-25 DIAGNOSIS — R269 Unspecified abnormalities of gait and mobility: Secondary | ICD-10-CM | POA: Diagnosis not present

## 2022-07-25 DIAGNOSIS — G3184 Mild cognitive impairment, so stated: Secondary | ICD-10-CM | POA: Diagnosis not present

## 2022-07-25 DIAGNOSIS — G20C Parkinsonism, unspecified: Secondary | ICD-10-CM

## 2022-07-25 MED ORDER — IOFLUPANE I 123 185 MBQ/2.5ML IV SOLN
4.1000 | Freq: Once | INTRAVENOUS | Status: AC | PRN
Start: 2022-07-25 — End: 2022-07-25
  Administered 2022-07-25: 4.1 via INTRAVENOUS
  Filled 2022-07-25: qty 5

## 2022-07-25 MED ORDER — POTASSIUM IODIDE (ANTIDOTE) 130 MG PO TABS
ORAL_TABLET | ORAL | Status: AC
  Administered 2022-07-25: 130 mg via ORAL
  Filled 2022-07-25: qty 1

## 2022-07-25 MED ORDER — POTASSIUM IODIDE (ANTIDOTE) 130 MG PO TABS: 130.0000 mg | ORAL_TABLET | Freq: Once | ORAL | Status: DC

## 2022-07-26 ENCOUNTER — Encounter: Payer: Self-pay | Admitting: Diagnostic Neuroimaging

## 2022-07-29 DIAGNOSIS — F341 Dysthymic disorder: Secondary | ICD-10-CM | POA: Diagnosis not present

## 2022-07-31 ENCOUNTER — Ambulatory Visit: Payer: PPO | Admitting: Physical Therapy

## 2022-08-05 DIAGNOSIS — E1165 Type 2 diabetes mellitus with hyperglycemia: Secondary | ICD-10-CM | POA: Diagnosis not present

## 2022-08-07 ENCOUNTER — Telehealth: Payer: Self-pay | Admitting: Physical Therapy

## 2022-08-07 ENCOUNTER — Ambulatory Visit: Payer: PPO | Admitting: Physical Therapy

## 2022-08-07 DIAGNOSIS — R2681 Unsteadiness on feet: Secondary | ICD-10-CM | POA: Diagnosis not present

## 2022-08-07 DIAGNOSIS — R2689 Other abnormalities of gait and mobility: Secondary | ICD-10-CM

## 2022-08-07 DIAGNOSIS — M6281 Muscle weakness (generalized): Secondary | ICD-10-CM

## 2022-08-07 NOTE — Telephone Encounter (Signed)
Dr. Leta Baptist,  [name] is being seen by physical therapy for Parkinsonism.  The patient would benefit from an occupational and speech language pathology evaluation for deficits related to PD.    If you agree, please place an order in [facility] workque in [facility] or fax the order to [phone].  Thank you, Charlett Nose, PT, [place] [address] [place], [place]  [address] Phone:  [phone] Fax:  [phone]

## 2022-08-07 NOTE — Therapy (Incomplete)
OUTPATIENT PHYSICAL THERAPY NEURO TREATMENT- RECERTIFICATION   Patient Name: Joel Webb MRN: [id number] DOB:1946-01-28, 76 y.o., male Today's Date: 08/07/2022   PT End of Session - 08/07/22 1448     Visit Number 4    Number of Visits 21   with eval + recert   Date for PT Re-Evaluation 26/41/58   recert   Authorization Type [employer]    PT Start Time 3094    PT Stop Time 1530    PT Time Calculation (min) 43 min    Equipment Utilized During Treatment Gait belt    Activity Tolerance Patient tolerated treatment well    Behavior During Therapy WFL for tasks assessed/performed;Impulsive               Past Medical History:  Diagnosis Date   Chronic kidney disease, stage 3 01/11/2021   ED (erectile dysfunction)    Essential hypertension 06/05/2017   Frequent falls    Gait abnormality    Gastroesophageal reflux disease 01/11/2021   Major depressive disorder    Microalbuminuria due to type 2 diabetes mellitus 05/25/2020   Mild cognitive impairment of uncertain or unknown etiology    Mixed hyperlipidemia 02/28/2016   Multiple lacunar infarcts    Plantar fasciitis    Prostatitis    Pure hypercholesterolemia 01/11/2021   PVC's (premature ventricular contractions) 01/28/2022   Uncontrolled type 2 diabetes mellitus with hyperglycemia, with long-term current use of insulin 02/28/2016   Past Surgical History:  Procedure Laterality Date   CIRCUMCISION     COLONOSCOPY     MOUTH SURGERY  01/2020   Patient Active Problem List   Diagnosis Date Noted   Multiple lacunar infarcts    Frequent falls    Gait abnormality    Major depressive disorder 05/30/2022   Mild cognitive impairment of uncertain or unknown etiology    PVC's (premature ventricular contractions) 01/28/2022   Pure hypercholesterolemia 01/11/2021   Gastroesophageal reflux disease 01/11/2021   Chronic kidney disease, stage 3 01/11/2021   Microalbuminuria due to type 2 diabetes mellitus 05/25/2020    Essential hypertension 06/05/2017   Uncontrolled type 2 diabetes mellitus with hyperglycemia, with long-term current use of insulin ([employer]) 02/28/2016   Mixed hyperlipidemia 02/28/2016    PCP: Antony Contras, MD  REFERRING PROVIDER: Penni Bombard, MD  ONSET DATE: 07/03/2022   REFERRING DIAG: G20 (ICD-10-CM) - Parkinsonism, unspecified Parkinsonism type ([place]); R29.6 (ICD-10-CM) - Frequent falls; R26.9 (ICD-10-CM) - Gait abnormality   THERAPY DIAG:  Unsteadiness on feet  Other abnormalities of gait and mobility  Muscle weakness (generalized)  Rationale for Evaluation and Treatment Rehabilitation  SUBJECTIVE:   SUBJECTIVE STATEMENT: Pt presents to session w/wife, [name]. Pt's wife reports pt is very unstable at home and she is concerned about him going up/down stairs as he almost fell backwards down steps last week. No new falls or changes    PERTINENT HISTORY: diabetes, hypercholesterolemia, high blood pressure, Parkinsonism (suspected PSP)   PAIN:  Are you having pain? No  PRECAUTIONS: Fall  OCCUPATION: retired, does a of computer work or reads, naps  PLOF: Independent with gait and Independent with transfers, occasional SPC use  PATIENT GOALS "to get where it doesn't hurt when I go up and down the steps"   OBJECTIVE:   DIAGNOSTIC FINDINGS:  IMPRESSION: (L knee xray 06/27/22) Minimal medial compartment osteoarthritis.  IMPRESSION: (R knee xray 06/27/22) Minimal medial and patellofemoral osteoarthritis.  COGNITION:  Overall cognitive status: Impaired and Difficulty to assess due to: no family present  [facility] PT Assessment - 08/07/22 1633       Observation/Other Assessments   Other Surveys  Lower Extremity Functional Scale    Lower Extremity Functional Scale  52/80 = 65%            TODAY'S TREATMENT: Therapeutic Activity   Gait pattern: step through pattern and trunk flexed Distance walked: 115' Assistive device utilized:  U-step Level of  assistance: Min A and Mod A Comments: Practiced use of U-step to demonstrate to wife types of ADs that are available to pt. Pt extremely impulsive and pushes AD too far anteriorly, requiring min-mod A to slow him down via tactile cues and blocking of walker as pt is not responsive to verbal cues. Pt reports he prefers using a cane but therapist strongly advising against a cane at this time due to pt's impulsiveness and safety concerns. Pt and wife verbalized understanding   LTG Assessment   [facility] PT Assessment - 08/07/22 1633       Observation/Other Assessments   Other Surveys  Lower Extremity Functional Scale    Lower Extremity Functional Scale  52/80 = 65%             Self-care/home management  Lengthy education provided by therapist regarding PSP vs Idiopathic PD, prognostic factors and role that PT plays in POC. Pt's wife inquiring about safe home setup and concerns for pt regarding stair navigation, driving and use of an AD at home. Informed pt he is not to be driving (per MD) and strongly advised against using a cane due to impulsiveness of pt and safety concerns. Therapist recommending pt use a walker of some type for improved safety, will further assess in future sessions.  Educated pt and wife on importance of interdisciplinary team approach w/parkinsonism and both verbalized agreement to obtain referrals for OT and SLP.  Discussed pt's goals for updated POC and pt and wife in agreement to prioritize reducing fall risk, fall recovery, stairs and overall safety w/mobility. Goals written below in accordance with pt's needs.  Discussed plan for PT moving forward and asked that wife be present for all sessions due to pt's impaired cognition and to ensure safety at home. [name] verbalized understanding.    PATIENT EDUCATION:  Education details: PSP and rehab for PD including changing PT POC to increase his frequency, importance of wife attending a PT session for training with HEP--pt should  not be performing HEP items with EC when alone Person educated: Patient Education method: Explanation Education comprehension: verbalized understanding and needs further education   HOME EXERCISE PROGRAM: To be established when wife is present    ASSESSMENT:  CLINICAL IMPRESSION: Emphasis of skilled PT session on LTG assessment, pt education and updating POC. Pt has met 0/3 LTGs, scoring lower on LEFS today than on eval despite denying pain. FGA goal has been discontinued due to it no longer beng applicable to pt's primary problem list. Pt has not had initial HEP established due to safety concerns at home and missing scheduled PT appointments. At this time, pt and wife in agreement to recertify pt at a frequency of 2x/week for 8 weeks to address gait/balance/safety impairments 2/2 suspected diagnosis of PSP. Pt and wife also requesting referrals to OT/SLP for PSP.     OBJECTIVE IMPAIRMENTS Abnormal gait, decreased balance, increased edema, impaired perceived functional ability, and pain.   ACTIVITY LIMITATIONS lifting, bending, squatting, stairs, and transfers  PARTICIPATION LIMITATIONS: cleaning, driving, community activity, and yard work  PERSONAL FACTORS 1-2 comorbidities:  diabetes, hypercholesterolemia, high blood pressure, and possible Parkinsonism are also affecting patient's functional outcome.   REHAB POTENTIAL: Fair due to impaired cognition and lack of family support  CLINICAL DECISION MAKING: Stable/uncomplicated  EVALUATION COMPLEXITY: Low   GOALS: Goals reviewed with patient? Yes  STG = LTG due to length of POC   LONG TERM GOALS: Target date: 07/30/2022   Pt will be independent with final HEP for improved strength, balance, transfers and gait. Baseline:  Goal status: IN PROGRESS  2.  Pt will improve FGA to 25/30 for decreased fall risk  Baseline: 17/30 (8/15) Goal status: DISCONTINUED   3.  Pt will improve score on LEFS to 65/80 (81.25% of normal  function) to demonstrate decreased disability level Baseline: 57/80 (8/15); 52/80 on 9/20  Goal status: NOT MET   SHORT TERM GOALS:   Target date: 09/04/2022  *** Baseline: *** Goal status: {GOALSTATUS:25110}  2.  *** Baseline: *** Goal status: {GOALSTATUS:25110}  3.  *** Baseline: *** Goal status: {GOALSTATUS:25110}  4.  *** Baseline: *** Goal status: {GOALSTATUS:25110}  5.  *** Baseline: *** Goal status: {GOALSTATUS:25110}  6.  *** Baseline: *** Goal status: {GOALSTATUS:25110}  LONG TERM GOALS:  Target date: 10/02/2022  Floor transfers  Baseline: *** Goal status: {GOALSTATUS:25110}  2.  Stairs  Baseline: *** Goal status: {GOALSTATUS:25110}  3.  Sit <>stands  Baseline: *** Goal status: {GOALSTATUS:25110}  4.  [employer]  Baseline: *** Goal status: {GOALSTATUS:25110}  5.  *** Baseline: *** Goal status: {GOALSTATUS:25110}  6.  *** Baseline: *** Goal status: {GOALSTATUS:25110}    PLAN: PT FREQUENCY: 1x/week  PT DURATION: 4 weeks  PLANNED INTERVENTIONS: Therapeutic exercises, Therapeutic activity, Neuromuscular re-education, Balance training, Gait training, Patient/Family education, Self Care, Joint mobilization, Joint manipulation, Stair training, DME instructions, Aquatic Therapy, Dry Needling, Electrical stimulation, Cryotherapy, Moist heat, Compression bandaging, Taping, Ultrasound, Biofeedback, Ionotophoresis 33m/ml Dexamethasone, Manual therapy, and Re-evaluation  PLAN FOR NEXT SESSION: refer to Dr. TCarles Collet initiate HEP if wife present, balance (especially with eyes closed), unlevel surfaces, education on PD/PSP, LE strengthening in pain-free range, SciFit, did referring provider send new referral so recert can be done to change frequency of visits--did pt schedule 2x/week for next 4 weeks?   JCruzita LedererPlaster, PT, DPT  08/07/2022, 4:36 PM

## 2022-08-12 DIAGNOSIS — E1165 Type 2 diabetes mellitus with hyperglycemia: Secondary | ICD-10-CM | POA: Diagnosis not present

## 2022-08-12 DIAGNOSIS — I10 Essential (primary) hypertension: Secondary | ICD-10-CM | POA: Diagnosis not present

## 2022-08-12 DIAGNOSIS — E7800 Pure hypercholesterolemia, unspecified: Secondary | ICD-10-CM | POA: Diagnosis not present

## 2022-08-12 DIAGNOSIS — N189 Chronic kidney disease, unspecified: Secondary | ICD-10-CM | POA: Diagnosis not present

## 2022-08-13 ENCOUNTER — Telehealth (INDEPENDENT_AMBULATORY_CARE_PROVIDER_SITE_OTHER): Payer: PPO | Admitting: Diagnostic Neuroimaging

## 2022-08-13 ENCOUNTER — Ambulatory Visit: Payer: PPO | Admitting: Physical Therapy

## 2022-08-13 ENCOUNTER — Encounter: Payer: Self-pay | Admitting: Diagnostic Neuroimaging

## 2022-08-13 DIAGNOSIS — G231 Progressive supranuclear ophthalmoplegia [Steele-Richardson-Olszewski]: Secondary | ICD-10-CM | POA: Diagnosis not present

## 2022-08-13 DIAGNOSIS — G20 Parkinson's disease: Secondary | ICD-10-CM | POA: Diagnosis not present

## 2022-08-13 DIAGNOSIS — F341 Dysthymic disorder: Secondary | ICD-10-CM | POA: Diagnosis not present

## 2022-08-13 DIAGNOSIS — R4189 Other symptoms and signs involving cognitive functions and awareness: Secondary | ICD-10-CM

## 2022-08-13 DIAGNOSIS — R296 Repeated falls: Secondary | ICD-10-CM | POA: Diagnosis not present

## 2022-08-13 DIAGNOSIS — R269 Unspecified abnormalities of gait and mobility: Secondary | ICD-10-CM

## 2022-08-13 NOTE — Progress Notes (Signed)
[facility]  PATIENT: Joel Webb DOB: [birth year]  REFERRING CLINICIAN: Antony Contras, MD HISTORY FROM: patient and wife REASON FOR VISIT: follow up   HISTORICAL  CHIEF COMPLAINT:  Chief Complaint  Patient presents with   Gait Problem    HISTORY OF PRESENT ILLNESS:   UPDATE (08/13/22, VRP): Since last visit, continues with gait abnormality, oral dyskinesias, eye movement difficulty, poor insight.  UPDATE (07/02/22, VRP): Since last visit, here for evaluation of gait difficulty, cognitive impairment, visual abnormalities.  Concern for progressive supranuclear palsy raised.  Wife concerned about patient's driving ability.  He agrees to stop driving today.  UPDATE (03/12/22, VRP): Since last visit, doing about the same. Symptoms are mainly balance issues, mood changes, memory changes, mainly noted per wife.   UPDATE (02/11/22, VRP): Since last visit, memory and balance issues continue. Neuropsych testing in 2022 showed similar issues; not clearly AD or FTD. Also had hypotension in Dec 2022 related to medication changes. More issues veering to the left (walking and driving).  UPDATE (10/16/20, VRP): Since last visit, patient feels stable.  He feels like his memory balance difficulties are stable.  Patient's wife is very concerned about progressive attention and focus difficulty, leaving the stove on, bumping into the wall on the left side, bruising his left arm, zoning out, not paying attention to her conversations.  Continues to have issues with marital discord.  UPDATE (06/12/20, VRP): Since last visit, doing well. Symptoms are improved. Severity is mild. No alleviating or aggravating factors. Tolerating meds. Memory and gait improved. Now exercising at [facility] 4x per week and feeling stronger.   UPDATE (02/28/20, Dr. Nicole Kindred): "I met with Joel Webb to review the findings resulting from his neuropsychological evaluation. Since the last appointment, he has been  about the same. Time was spent reviewing the impressions and recommendations that are detailed in the evaluation report. I explained that int he present case, we do not have enough for a diagnosis of frank cognitive impairment and that I think depression, relational issues, and other more common reversible factors are the likely causes of [name] difficulties. I counseled them about things to watch out for in FTD and was candid about the fact that the condition is often misdiagnosed as depression or relationship distress. We now have a strong baseline against which to compare [name] future performance. Interventions provided during this encounter included psychoeducation, and other topics as reflected in the patient instructions. I took time to explain the findings and answer all the patient's questions. I encouraged [name] to contact me should he have any further questions or if further follow up is desired."  UPDATE (01/26/20, VRP): Since last visit, doing about the same. Symptoms are persistent. Severity is moderate per patient. No alleviating or aggravating factors. Wife still notes memory and personality changes.  PRIOR HPI (12/22/19): 76 year old male here for evaluation of gait and balance difficulty.  History of diabetes, hypercholesterolemia, high blood pressure.  For past 6 to 8 months patient has had sensation of stumbling, tripping, numbness in feet, left foot incoordination.  Symptoms worsening over time.  He has more difficulty with steps and uneven surfaces.  He notices problem in his left foot.  Has had some back pain in the past.  Wife also notes at least 1 year of memory loss, personality and mood changes, irritable, paranoid, forgetting anniversary dates, forgetting medications and appointments, leaving stove on.  Patient and wife had COVID in Jan 2021.    REVIEW OF  SYSTEMS: Full 14 system review of systems performed and negative with exception of: As per  HPI.  ALLERGIES: No Known Allergies  HOME MEDICATIONS: Outpatient Medications Prior to Visit  Medication Sig Dispense Refill   amLODipine (NORVASC) 5 MG tablet Take 5 mg by mouth daily.     atorvastatin (LIPITOR) 80 MG tablet Take 80 mg by mouth daily.     BD INSULIN SYRINGE U/F 31G X 5/16" 1 ML MISC USE TO GIVE 2 INSULIN INJECTIONS DAILY AS DIRECTED BY YOUR DOCTOR 200 each 5   Blood Glucose Monitoring Suppl (ACCU-CHEK GUIDE) w/Device KIT Use to check blood sugar once a day 1 kit 0   DULoxetine (CYMBALTA) 60 MG capsule 60 mg daily.     ezetimibe (ZETIA) 10 MG tablet Take 1 tablet (10 mg total) by mouth daily. 90 tablet 3   finasteride (PROSCAR) 5 MG tablet      glucose blood (ACCU-CHEK GUIDE) test strip Use as instructed to check blood sugar once a day dx code E11.65 100 each 2   Insulin Pen Needle 32G X 4 MM MISC Use on insulin pen 90 each 1   Multiple Vitamin (MULTIVITAMIN WITH MINERALS) TABS tablet Take 1 tablet by mouth daily.     NOVOLIN R 100 UNIT/ML injection INJECT 0.08 MLS(8 UNITS TOTAL) INTO THE SKIN TWICE DAILY BEFORE A MEAL; ALSO INJECT 10 UNITS UNDER THE SKIN ONCE DAILY AT SUPPER (Patient taking differently: 6units am, 8-10units lunch, 12 units at supper) 10 mL 3   olmesartan (BENICAR) 20 MG tablet TAKE ONE TABLET BY MOUTH DAILY; (REPLACING BENICAR HCT-DOCTOR KUMAR) 90 tablet 1   Omega-3 Fatty Acids (FISH OIL) 1000 MG CAPS Take by mouth. 2x/day     omeprazole (PRILOSEC) 20 MG capsule Take 20 mg by mouth every other day. PRN     OneTouch Delica Lancets 33G MISC Use to check blood sugar once a day 100 each 2   tamsulosin (FLOMAX) 0.4 MG CAPS capsule Take 0.4 mg by mouth. Reported on 05/29/2016     TRESIBA FLEXTOUCH 200 UNIT/ML FlexTouch Pen INJECT 40 UNITS UNDER THE SKIN DAILY 18 mL 2   TRULICITY 1.5 MG/0.5ML SOPN INJECT 1.5 MG UNDER THE SKIN ONCE WEEKLY 2 mL 5   No facility-administered medications prior to visit.    PAST MEDICAL HISTORY: Past Medical History:  Diagnosis  Date   Chronic kidney disease, stage 3 01/11/2021   ED (erectile dysfunction)    Essential hypertension 06/05/2017   Frequent falls    Gait abnormality    Gastroesophageal reflux disease 01/11/2021   Major depressive disorder    Microalbuminuria due to type 2 diabetes mellitus 05/25/2020   Mild cognitive impairment of uncertain or unknown etiology    Mixed hyperlipidemia 02/28/2016   Multiple lacunar infarcts    Plantar fasciitis    Prostatitis    Pure hypercholesterolemia 01/11/2021   PVC's (premature ventricular contractions) 01/28/2022   Uncontrolled type 2 diabetes mellitus with hyperglycemia, with long-term current use of insulin 02/28/2016    PAST SURGICAL HISTORY: Past Surgical History:  Procedure Laterality Date   CIRCUMCISION     COLONOSCOPY     MOUTH SURGERY  01/2020    FAMILY HISTORY: Family History  Problem Relation Age of Onset   Hepatitis C Mother    Mesothelioma Father    Diabetes Father    Heart disease Father     SOCIAL HISTORY: Social History   Socioeconomic History   Marital status: Married    Spouse name: Aurea Graff  Number of children: 1   Years of education: 16   Highest education level: Bachelor's degree (e.g., BA, AB, BS)  Occupational History   Occupation: Retired    Comment: sales  Tobacco Use   Smoking status: Former    Types: Cigarettes    Quit date: 12/20/2008    Years since quitting: 13.6   Smokeless tobacco: Never  Substance and Sexual Activity   Alcohol use: Yes    Comment: rarely   Drug use: No   Sexual activity: Not on file  Other Topics Concern   Not on file  Social History Narrative   01/26/20 Lives with wife   Caffeine, none   Social Determinants of Health   Financial Resource Strain: Not on file  Food Insecurity: Not on file  Transportation Needs: Not on file  Physical Activity: Not on file  Stress: Not on file  Social Connections: Not on file  Intimate Partner Violence: Not on file     PHYSICAL EXAM  Video  visit  DIAGNOSTIC DATA (LABS, IMAGING, TESTING) - I reviewed patient records, labs, notes, testing and imaging myself where available.  Lab Results  Component Value Date   WBC 8.4 03/13/2022   HGB 13.6 03/13/2022   HCT 40.5 03/13/2022   MCV 90.4 03/13/2022   PLT 145 (L) 03/13/2022      Component Value Date/Time   NA 139 06/04/2022 0850   K 4.7 06/04/2022 0850   CL 101 06/04/2022 0850   CO2 21 06/04/2022 0850   GLUCOSE 196 (H) 06/04/2022 0850   GLUCOSE 141 (H) 03/13/2022 1548   BUN 26 06/04/2022 0850   CREATININE 1.46 (H) 06/04/2022 0850   CALCIUM 9.8 06/04/2022 0850   PROT 6.4 06/04/2022 0850   ALBUMIN 4.0 06/04/2022 0850   AST 12 06/04/2022 0850   ALT 15 06/04/2022 0850   ALKPHOS 123 (H) 06/04/2022 0850   BILITOT 0.4 06/04/2022 0850   GFRNONAA 38 (L) 03/13/2022 1548   Lab Results  Component Value Date   CHOL 181 06/04/2022   HDL 33 (L) 06/04/2022   LDLCALC 120 (H) 06/04/2022   LDLDIRECT 107.0 01/08/2022   TRIG 158 (H) 06/04/2022   CHOLHDL 5.5 (H) 06/04/2022   Lab Results  Component Value Date   HGBA1C 7.6 (H) 01/08/2022   Lab Results  Component Value Date   MIWOEHOZ22 482 12/22/2019   Lab Results  Component Value Date   TSH 1.71 02/02/2016    01/12/20 MRI brain [I reviewed images myself and agree with interpretation. -VRP]  - Mild atrophy and mild chronic small vessel ischemic disease. - No acute findings.  01/12/20 MRI cervical spine [I reviewed images myself and agree with interpretation. -VRP]  - At C3-4: disc bulging and facet hypertrophy with mild spinal stenosis and severe biforaminal stenosis. - At C2-3: disc bulging and facet hypertrophy with moderate biforaminal stenosis.  02/23/22 MRI brain 1. No reversible cause for symptoms. 2. Stable cortical and cerebellar volume loss. Somewhat prominent midbrain thinning, are there symptoms of supranuclear palsy? 3. Interval lacunar infarct in the right putamen, but overall mild chronic small vessel  ischemia for age.  03/04/22 PET brain  - No significant loss of cortical metabolism to suggest Alzheimer's type dementia. No evidence of frontotemporal dementia  07/25/22 DATscan Significant bilateral decreased radiotracer activity within LEFT and RIGHT striatum. Deficit greater on the RIGHT. Findings suggestive of Parkinson's syndrome pathologies.    ASSESSMENT AND PLAN  76 y.o. year old male here with mild gait and balance difficulty,  incoordination, memory loss, personality mood changes.   Dx:  1. Gait difficulty   2. Cognitive deficits   3. Frequent falls   4. Parkinsonism, unspecified Parkinsonism type ([place])   5. PSP (progressive supranuclear palsy) ([place])     PLAN:  Virtual Visit via Video Note  I connected with Joel Webb on 08/15/22 at  1:45 PM EDT by a video enabled telemedicine application and verified that I am speaking with the correct person using two identifiers.   I discussed the limitations of evaluation and management by telemedicine and the availability of in person appointments. The patient expressed understanding and agreed to proceed.  Patient is at home and I am at the office.   I spent 25 minutes of face-to-face and non-face-to-face time with patient.  This included previsit chart review, lab review, study review, order entry, electronic health record documentation, patient education.      GAIT DIFF / OCULOMOTOR DYSFUNCTION / FACIAL DYSKINESIA (possible PSP vs other atypical parkinsonism, MSA, neurodegenerative dz) - safety precautions reviewed - no driving; fall precautions - continue PT, OT, ST  MEMORY LOSS / BEHAVIOR / PERSONALITY changes (could be related to neurodegenerative dz; depression, marital issues, hearing loss also may be contributory) ->Mild Neurocognitive Disorder ("mild cognitive impairment").   MILD GAIT DIFFICULTY (some mild diabetic neuropathy; cannot rule out lumbar spine issues, but patient not interested in surgery) -  continue PT for gait and balance training and fall recovery  Return for pending if symptoms worsen or fail to improve.       Penni Bombard, MD 7/35/3299, 2:42 PM Certified in Neurology, Neurophysiology and Neuroimaging  [facility] [address] [place], [place] [address] [phone]

## 2022-08-15 ENCOUNTER — Telehealth: Payer: Self-pay | Admitting: Physical Therapy

## 2022-08-15 ENCOUNTER — Ambulatory Visit: Payer: PPO

## 2022-08-15 DIAGNOSIS — M6281 Muscle weakness (generalized): Secondary | ICD-10-CM

## 2022-08-15 DIAGNOSIS — R2689 Other abnormalities of gait and mobility: Secondary | ICD-10-CM

## 2022-08-15 DIAGNOSIS — R293 Abnormal posture: Secondary | ICD-10-CM

## 2022-08-15 DIAGNOSIS — R4189 Other symptoms and signs involving cognitive functions and awareness: Secondary | ICD-10-CM

## 2022-08-15 DIAGNOSIS — R2681 Unsteadiness on feet: Secondary | ICD-10-CM

## 2022-08-15 DIAGNOSIS — G20 Parkinson's disease: Secondary | ICD-10-CM

## 2022-08-15 NOTE — Telephone Encounter (Signed)
Joel Webb is being seen by physical therapy for Parkinsonism.  The patient would benefit from an occupational and speech language pathology evaluation for deficits related to PD.     If you agree, please place an order in [facility] workque in [facility] or fax the order to [phone].   Thank you, Charlett Nose, PT, [place]  [address] [place], [place]  [address] Phone:  [phone] Fax:  [phone]

## 2022-08-15 NOTE — Telephone Encounter (Signed)
Do you agree?

## 2022-08-15 NOTE — Therapy (Signed)
OUTPATIENT PHYSICAL THERAPY NEURO TREATMENT- RECERTIFICATION   Patient Name: Joel Webb MRN: [id number] DOB:06-May-1946, 76 y.o., male Today's Date: 08/15/2022   PT End of Session - 08/15/22 1358     Visit Number 5    Number of Visits 21    Date for PT Re-Evaluation 10/09/22    Authorization Type [employer]    Progress Note Due on Visit 10    PT Start Time 1400    PT Stop Time 1441    PT Time Calculation (min) 41 min    Equipment Utilized During Treatment Gait belt    Activity Tolerance Patient tolerated treatment well    Behavior During Therapy WFL for tasks assessed/performed;Impulsive               Past Medical History:  Diagnosis Date   Chronic kidney disease, stage 3 01/11/2021   ED (erectile dysfunction)    Essential hypertension 06/05/2017   Frequent falls    Gait abnormality    Gastroesophageal reflux disease 01/11/2021   Major depressive disorder    Microalbuminuria due to type 2 diabetes mellitus 05/25/2020   Mild cognitive impairment of uncertain or unknown etiology    Mixed hyperlipidemia 02/28/2016   Multiple lacunar infarcts    Plantar fasciitis    Prostatitis    Pure hypercholesterolemia 01/11/2021   PVC's (premature ventricular contractions) 01/28/2022   Uncontrolled type 2 diabetes mellitus with hyperglycemia, with long-term current use of insulin 02/28/2016   Past Surgical History:  Procedure Laterality Date   CIRCUMCISION     COLONOSCOPY     MOUTH SURGERY  01/2020   Patient Active Problem List   Diagnosis Date Noted   Multiple lacunar infarcts    Frequent falls    Gait abnormality    Major depressive disorder 05/30/2022   Mild cognitive impairment of uncertain or unknown etiology    PVC's (premature ventricular contractions) 01/28/2022   Pure hypercholesterolemia 01/11/2021   Gastroesophageal reflux disease 01/11/2021   Chronic kidney disease, stage 3 01/11/2021   Microalbuminuria due to type 2 diabetes mellitus  05/25/2020   Essential hypertension 06/05/2017   Uncontrolled type 2 diabetes mellitus with hyperglycemia, with long-term current use of insulin ([place]) 02/28/2016   Mixed hyperlipidemia 02/28/2016    PCP: Antony Contras, MD  REFERRING PROVIDER: Penni Bombard, MD  ONSET DATE: 07/03/2022   REFERRING DIAG: G20 (ICD-10-CM) - Parkinsonism, unspecified Parkinsonism type ([place]); R29.6 (ICD-10-CM) - Frequent falls; R26.9 (ICD-10-CM) - Gait abnormality   THERAPY DIAG:  Unsteadiness on feet  Muscle weakness (generalized)  Other abnormalities of gait and mobility  Abnormal posture  Rationale for Evaluation and Treatment Rehabilitation  SUBJECTIVE:   SUBJECTIVE STATEMENT: Patient with his wife. States he fell earlier this week letting the dog in "trying to do too much at once." Denies hitting his head, was able to get up himself using rocking chair.   PERTINENT HISTORY: diabetes, hypercholesterolemia, high blood pressure, Parkinsonism (suspected PSP)   PAIN:  Are you having pain? No  PRECAUTIONS: Fall  OCCUPATION: retired, does a of computer work or reads, naps  PLOF: Independent with gait and Independent with transfers, occasional SPC use  PATIENT GOALS "to get where it doesn't hurt when I go up and down the steps"   OBJECTIVE:   DIAGNOSTIC FINDINGS:  IMPRESSION: (L knee xray 06/27/22) Minimal medial compartment osteoarthritis.  IMPRESSION: (R knee xray 06/27/22) Minimal medial and patellofemoral osteoarthritis.  COGNITION:  Overall cognitive status: Impaired and Difficulty to assess due to: no family present  [facility] PT Assessment - 08/15/22 0001       Standardized Balance Assessment   Standardized Balance Assessment Mini-BESTest    Five times sit to stand comments  15.9s no UE      Mini-BESTest   Sit To Stand Normal: Comes to stand without use of hands and stabilizes independently.    Rise to Toes < 3 s.    Stand on one leg (left) Moderate: < 20 s    Stand  on one leg (right) Severe: Unable    Stand on one leg - lowest score 0    Compensatory Stepping Correction - Forward Moderate: More than one step is required to recover equilibrium    Compensatory Stepping Correction - Backward Moderate: More than one step is required to recover equilibrium    Compensatory Stepping Correction - Left Lateral Moderate: Several steps to recover equilibrium    Compensatory Stepping Correction - Right Lateral Moderate: Several steps to recover equilibrium    Stepping Corredtion Lateral - lowest score 1    Stance - Feet together, eyes open, firm surface  Normal: 30s    Stance - Feet together, eyes closed, foam surface  Moderate: < 30s    Incline - Eyes Closed Moderate: Stands independently < 30s OR aligns with surface    Change in Gait Speed Moderate: Unable to change walking speed or signs of imbalance    Walk with head turns - Horizontal Severe: performs head turns with imbalance    Walk with pivot turns Moderate:Turns with feet close SLOW (>4 steps) with good balance.    Step over obstacles Moderate: Steps over box but touches box OR displays cautious behavior by slowing gait.    Timed UP & GO with Dual Task Normal: No noticeable change in sitting, standing or walking while backward counting when compared to TUG without    Mini-BEST total score 14      Timed Up and Go Test   Normal TUG (seconds) 12.81    Cognitive TUG (seconds) 12.47   4 calculation            TODAY'S TREATMENT: Therapeutic Activity   [facility] PT Assessment - 08/15/22 0001       Standardized Balance Assessment   Standardized Balance Assessment Mini-BESTest    Five times sit to stand comments  15.9s no UE      Mini-BESTest   Sit To Stand Normal: Comes to stand without use of hands and stabilizes independently.    Rise to Toes < 3 s.    Stand on one leg (left) Moderate: < 20 s    Stand on one leg (right) Severe: Unable    Stand on one leg - lowest score 0    Compensatory Stepping  Correction - Forward Moderate: More than one step is required to recover equilibrium    Compensatory Stepping Correction - Backward Moderate: More than one step is required to recover equilibrium    Compensatory Stepping Correction - Left Lateral Moderate: Several steps to recover equilibrium    Compensatory Stepping Correction - Right Lateral Moderate: Several steps to recover equilibrium    Stepping Corredtion Lateral - lowest score 1    Stance - Feet together, eyes open, firm surface  Normal: 30s    Stance - Feet together, eyes closed, foam surface  Moderate: < 30s    Incline - Eyes Closed Moderate: Stands independently < 30s OR aligns with surface    Change in Gait Speed Moderate: Unable to change walking speed  or signs of imbalance    Walk with head turns - Horizontal Severe: performs head turns with imbalance    Walk with pivot turns Moderate:Turns with feet close SLOW (>4 steps) with good balance.    Step over obstacles Moderate: Steps over box but touches box OR displays cautious behavior by slowing gait.    Timed UP & GO with Dual Task Normal: No noticeable change in sitting, standing or walking while backward counting when compared to TUG without    Mini-BEST total score 14      Timed Up and Go Test   Normal TUG (seconds) 12.81    Cognitive TUG (seconds) 12.47   4 calculation           Self care: -how to safely get in and out of the car (sit first and then swivel in)  -walking with no hands in his pockets to improve access when he does lose his balance to regain it  -not carrying fragile, heavy, hot items up and down the stairs -wife and patient verbalized understanding   PATIENT EDUCATION:  Education details: outcome measure results, use of AD, see self care section Person educated: Patient Education method: Explanation Education comprehension: verbalized understanding and needs further education   HOME EXERCISE PROGRAM: To be established when wife is present     ASSESSMENT:  CLINICAL IMPRESSION: Patient seen for skilled PT session with emphasis on outcome measure assessment. Patient demonstrates increased fall risk as noted by score of 14/28 on the MiniBESTest <16/28= predictive of falls in elderly, <17.5/28= predictive of falls in stroke, <19/28= predictive of falls in PD, <19.5/28= benefit from use of AD in [place] MCID= 4 points  (Database of Knowledge Translation Tools Assessment Summary, 2017). Five times Sit to Stand Test (FTSS) Method: Use a straight back chair with a solid seat that is 17-18" high. Ask participant to sit on the chair with arms folded across their chest.   Instructions: "Stand up and sit down as quickly as possible 5 times, keeping your arms folded across your chest."   Measurement: Stop timing when the participant touches the chair in sitting the 5th time.  TIME: 15.9 sec with no UE  Cut off scores indicative of increased fall risk: >12 sec CVA, >16 sec PD, >13 sec vestibular (ANPTA Core Set of Outcome Measures for Adults with Neurologic Conditions, 2018). Patient completed the Timed Up and Go test (TUG) in 12.81 seconds.  Geriatrics: need for further assessment of fall risk: ? 12 sec; Recurrent falls: > 15 sec; Vestibular Disorders fall risk: > 15 sec; Parkinson's Disease fall risk: > 16 sec ([email or web address], 2023). He completed the TUG Cog in 12.47s with 4 accurate calculations. Patient noted to have slightly decreased L peripheral vision as tested with confrontation. Likely contributing to patients instances of bumping into obstacles primarily on the L side. Continue POC.      OBJECTIVE IMPAIRMENTS Abnormal gait, decreased balance, increased edema, impaired perceived functional ability, and pain.   ACTIVITY LIMITATIONS lifting, bending, squatting, stairs, and transfers  PARTICIPATION LIMITATIONS: cleaning, driving, community activity, and yard work  PERSONAL FACTORS 1-2 comorbidities:    diabetes, hypercholesterolemia,  high blood pressure, and possible Parkinsonism are also affecting patient's functional outcome.   REHAB POTENTIAL: Fair due to impaired cognition and lack of family support  CLINICAL DECISION MAKING: Stable/uncomplicated  EVALUATION COMPLEXITY: Low   GOALS: Goals reviewed with patient? Yes   OLD LONG TERM GOALS: Target date: 07/30/2022   Pt will be independent with final  HEP for improved strength, balance, transfers and gait. Baseline:  Goal status: IN PROGRESS  2.  Pt will improve FGA to 25/30 for decreased fall risk  Baseline: 17/30 (8/15) Goal status: DISCONTINUED   3.  Pt will improve score on LEFS to 65/80 (81.25% of normal function) to demonstrate decreased disability level Baseline: 57/80 (8/15); 52/80 on 9/20  Goal status: NOT MET   NEW SHORT TERM GOALS FOR UPDATED POC:   Target date: 09/04/2022  Pt will perform initial HEP with min A from wife for improved strength, balance, transfers and gait.  Baseline: not yet established  Goal status: INITIAL  2. Patient will improve MiniBESTest score to >/= 18/28 to demonstrate decreased risk for falling Baseline: 14/28 Goal status: INITIAL  3.  Pt will improve 5x STS to </= 9 sec to demo improved functional LE strength and balance  Baseline: 12.8 Goal status: INITIAL  4.  Pt and wife will verbalize and demonstrate fall prevention techniques in the home and community setting for reduced fall risk and maintained independence  Baseline:  Goal status: INITIAL  5.  Pt will ambulate >300' on level and unlevel surfaces w/LRAD and min A for improved functional mobility and safety w/ambulation  Baseline:  Goal status: INITIAL  NEW LONG TERM GOALS FOR UPDATED POC:  Target date: 10/02/2022  Pt will perform floor transfers w/S* for improved fall recovery and safety at home  Baseline:  Goal status: INITIAL  2.  Pt will ascend/descend 12 steps using single rail either sideways or step-to pattern w/S* for improved household  mobility and safety  Baseline:  Goal status: INITIAL  3.  Patient will improve MiniBESTest score to >/=22/28 to demonstrate decreased risk for falling Baseline: 18/28 Goal status: INITIAL  4.  Pt will perform final HEP w/min A from wife for improved strength, balance, transfers and gait.  Baseline:  Goal status: INITIAL  5.  Pt will ambulate >500' on level and unlevel surfaces w/LRAD and S* for improved functional mobility and independence  Baseline:  Goal status: INITIAL    PLAN: PT FREQUENCY: 2x/week (recert)  PT DURATION: 8 weeks (recert)  PLANNED INTERVENTIONS: Therapeutic exercises, Therapeutic activity, Neuromuscular re-education, Balance training, Gait training, Patient/Family education, Self Care, Joint mobilization, Joint manipulation, Stair training, DME instructions, Aquatic Therapy, Dry Needling, Electrical stimulation, Cryotherapy, Moist heat, Compression bandaging, Taping, Ultrasound, Biofeedback, Ionotophoresis 60m/ml Dexamethasone, Manual therapy, and Re-evaluation  PLAN FOR NEXT SESSION: refer to Dr. TCarles Collet 10MWT. initiate HEP if wife present, balance (especially with eyes closed), unlevel surfaces, education on PD/PSP, LE strengthening in pain-free range, SciFit    JDebbora Dus PT, DPT  JDebbora Dus PT, DPT, CBIS  08/15/2022, 2:48 PM

## 2022-08-20 ENCOUNTER — Ambulatory Visit: Payer: PPO | Admitting: Physical Therapy

## 2022-08-22 ENCOUNTER — Ambulatory Visit: Payer: PPO

## 2022-08-22 DIAGNOSIS — U071 COVID-19: Secondary | ICD-10-CM | POA: Diagnosis not present

## 2022-08-22 DIAGNOSIS — R059 Cough, unspecified: Secondary | ICD-10-CM | POA: Diagnosis not present

## 2022-08-27 ENCOUNTER — Ambulatory Visit: Payer: PPO | Admitting: Physical Therapy

## 2022-08-29 ENCOUNTER — Ambulatory Visit: Payer: PPO

## 2022-09-02 DIAGNOSIS — F341 Dysthymic disorder: Secondary | ICD-10-CM | POA: Diagnosis not present

## 2022-09-03 ENCOUNTER — Ambulatory Visit: Payer: PPO

## 2022-09-03 ENCOUNTER — Ambulatory Visit: Payer: PPO | Attending: Diagnostic Neuroimaging | Admitting: Physical Therapy

## 2022-09-03 DIAGNOSIS — R2689 Other abnormalities of gait and mobility: Secondary | ICD-10-CM | POA: Diagnosis not present

## 2022-09-03 DIAGNOSIS — R4189 Other symptoms and signs involving cognitive functions and awareness: Secondary | ICD-10-CM | POA: Diagnosis not present

## 2022-09-03 DIAGNOSIS — R293 Abnormal posture: Secondary | ICD-10-CM | POA: Diagnosis not present

## 2022-09-03 DIAGNOSIS — R1310 Dysphagia, unspecified: Secondary | ICD-10-CM | POA: Insufficient documentation

## 2022-09-03 DIAGNOSIS — G20A1 Parkinson's disease without dyskinesia, without mention of fluctuations: Secondary | ICD-10-CM | POA: Diagnosis not present

## 2022-09-03 DIAGNOSIS — M6281 Muscle weakness (generalized): Secondary | ICD-10-CM | POA: Insufficient documentation

## 2022-09-03 DIAGNOSIS — Z9181 History of falling: Secondary | ICD-10-CM | POA: Diagnosis not present

## 2022-09-03 DIAGNOSIS — R2681 Unsteadiness on feet: Secondary | ICD-10-CM | POA: Insufficient documentation

## 2022-09-03 NOTE — Therapy (Signed)
OUTPATIENT PHYSICAL THERAPY NEURO TREATMENT   Patient Name: Joel Webb MRN: [id number] DOB:17-Feb-1946, 76 y.o., male Today's Date: 09/03/2022   PT End of Session - 09/03/22 0854     Visit Number 6    Number of Visits 21    Date for PT Re-Evaluation 10/09/22    Authorization Type [employer]    Progress Note Due on Visit 10    PT Start Time [phone]   Pt arrived late   PT Stop Time 0931    PT Time Calculation (min) 39 min    Equipment Utilized During Treatment Gait belt    Activity Tolerance Patient tolerated treatment well    Behavior During Therapy [facility] for tasks assessed/performed;Impulsive                Past Medical History:  Diagnosis Date   Chronic kidney disease, stage 3 01/11/2021   ED (erectile dysfunction)    Essential hypertension 06/05/2017   Frequent falls    Gait abnormality    Gastroesophageal reflux disease 01/11/2021   Major depressive disorder    Microalbuminuria due to type 2 diabetes mellitus 05/25/2020   Mild cognitive impairment of uncertain or unknown etiology    Mixed hyperlipidemia 02/28/2016   Multiple lacunar infarcts    Plantar fasciitis    Prostatitis    Pure hypercholesterolemia 01/11/2021   PVC's (premature ventricular contractions) 01/28/2022   Uncontrolled type 2 diabetes mellitus with hyperglycemia, with long-term current use of insulin 02/28/2016   Past Surgical History:  Procedure Laterality Date   CIRCUMCISION     COLONOSCOPY     MOUTH SURGERY  01/2020   Patient Active Problem List   Diagnosis Date Noted   Multiple lacunar infarcts    Frequent falls    Gait abnormality    Major depressive disorder 05/30/2022   Mild cognitive impairment of uncertain or unknown etiology    PVC's (premature ventricular contractions) 01/28/2022   Pure hypercholesterolemia 01/11/2021   Gastroesophageal reflux disease 01/11/2021   Chronic kidney disease, stage 3 01/11/2021   Microalbuminuria due to type 2 diabetes mellitus  05/25/2020   Essential hypertension 06/05/2017   Uncontrolled type 2 diabetes mellitus with hyperglycemia, with long-term current use of insulin ([place]) 02/28/2016   Mixed hyperlipidemia 02/28/2016    PCP: Antony Contras, MD  REFERRING PROVIDER: Penni Bombard, MD  ONSET DATE: 07/03/2022   REFERRING DIAG: G20 (ICD-10-CM) - Parkinsonism, unspecified Parkinsonism type ([place]); R29.6 (ICD-10-CM) - Frequent falls; R26.9 (ICD-10-CM) - Gait abnormality   THERAPY DIAG:  Unsteadiness on feet  History of falling  Rationale for Evaluation and Treatment Rehabilitation  SUBJECTIVE:   SUBJECTIVE STATEMENT: Patient presents with his wife. Pt and wife report they have had a rough couple weeks w/wife's mother passing away from covid and then both the pt and wife got covid, so pt has not been in clinic for about one month. Pt has had 3 falls since last visit, once in bedroom, once on patio (while turning to sit on rocking chair) and once out of the front door. One injury noted, with his L arm bandaged. Pt has only been using the walker at night when he uses the bathroom. Wife is getting a chair lift on stairs next week and she purchased a lift reclining chair for living room.   PERTINENT HISTORY: diabetes, hypercholesterolemia, high blood pressure, Parkinsonism (suspected PSP)   PAIN:  Are you having pain? No  PRECAUTIONS: Fall  OCCUPATION: retired, does a of computer work or reads, naps  PLOF: Independent with gait and Independent with transfers, occasional SPC use  PATIENT GOALS "to get where it doesn't hurt when I go up and down the steps"   OBJECTIVE:   DIAGNOSTIC FINDINGS:  IMPRESSION: (L knee xray 06/27/22) Minimal medial compartment osteoarthritis.  IMPRESSION: (R knee xray 06/27/22) Minimal medial and patellofemoral osteoarthritis.  COGNITION:  Overall cognitive status: Impaired and Difficulty to assess due to: no family present     TODAY'S TREATMENT: Ther Act  Pt's wife  reports that she is having difficulty w/compliance at home, as pt drove himself to an appointment yesterday while she was not home (she did not know about the appointment) and is impulsively performing transfers. Pt states that he does not remember how to properly perform transfers and "forgets to think about it". Educated pt on importance of making an effort to think about what he is doing while he is doing it, as his impulsive behavior will increase his fall risk and potentially lead to injury. Reviewed proper techniques for car transfers, stairs and turning to sit, but pt frequently looking around gym rather than listening to therapist.  Discussed practicing floor transfer and car transfer next session as well as establishing initial HEP. Educated pt on walking program being the most important exercise he can do, as he is currently not walking during day other than short distances inside his house. Wife reports that once her wife's funeral is over (11/5), she will have more time to walk with patient both outside and at the [facility].    STG assessment   [facility] PT Assessment - 09/03/22 0916       Transfers   Five time sit to stand comments  15.69s without UE support             Gait pattern: step through pattern, decreased arm swing- Right, decreased arm swing- Left, decreased stride length, decreased hip/knee flexion- Right, decreased hip/knee flexion- Left, decreased trunk rotation, trunk flexed, narrow BOS, poor foot clearance- Right, and poor foot clearance- Left Distance walked: Various clinic distances  Assistive device utilized: None Level of assistance: SBA Comments: Noted pt maintains L hand in his pocket and his R hand in a fist despite cues from both therapist and wife.     PATIENT EDUCATION:  Education details: Plan for next session, see above  Person educated: Patient Education method: Explanation Education comprehension: verbalized understanding and needs further  education   HOME EXERCISE PROGRAM: To be established when wife is present    ASSESSMENT:  CLINICAL IMPRESSION: Emphasis of skilled PT session on STG assessment and pt education. Pt has not met any STGs so far, requiring more time to perform 5x STS today compared to previous assessment. However, pt has cancelled his PT appointments for past 3 weeks due to a death in the family, so goals will be pushed back. Pt's wife reports pt continues to be very impulsive and noncompliant w/PT recommendations. Wife also requesting pt practice floor transfer during next session. Continue POC.      OBJECTIVE IMPAIRMENTS Abnormal gait, decreased balance, increased edema, impaired perceived functional ability, and pain.   ACTIVITY LIMITATIONS lifting, bending, squatting, stairs, and transfers  PARTICIPATION LIMITATIONS: cleaning, driving, community activity, and yard work  PERSONAL FACTORS 1-2 comorbidities:    diabetes, hypercholesterolemia, high blood pressure, and possible Parkinsonism are also affecting patient's functional outcome.   REHAB POTENTIAL: Fair due to impaired cognition and lack of family support  CLINICAL DECISION MAKING: Stable/uncomplicated  EVALUATION COMPLEXITY: Low   GOALS:  Goals reviewed with patient? Yes   NEW SHORT TERM GOALS FOR UPDATED POC:   Target date: 09/04/2022  Pt will perform initial HEP with min A from wife for improved strength, balance, transfers and gait.  Baseline: not yet established  Goal status: NOT MET  2. Patient will improve MiniBESTest score to >/= 18/28 to demonstrate decreased risk for falling Baseline: 14/28 Goal status: INITIAL  3.  Pt will improve 5x STS to </= 9 sec to demo improved functional LE strength and balance  Baseline: 12.8; 15.69s without UE support  Goal status: NOT MET  4.  Pt and wife will verbalize and demonstrate fall prevention techniques in the home and community setting for reduced fall risk and maintained  independence  Baseline:  Goal status: MET  5.  Pt will ambulate >300' on level and unlevel surfaces w/LRAD and min A for improved functional mobility and safety w/ambulation  Baseline:  Goal status: INITIAL  NEW LONG TERM GOALS FOR UPDATED POC:  Target date: 10/02/2022  Pt will perform floor transfers w/S* for improved fall recovery and safety at home  Baseline:  Goal status: INITIAL  2.  Pt will ascend/descend 12 steps using single rail either sideways or step-to pattern w/S* for improved household mobility and safety  Baseline:  Goal status: INITIAL  3.  Patient will improve MiniBESTest score to >/=22/28 to demonstrate decreased risk for falling Baseline: 18/28 Goal status: INITIAL  4.  Pt will perform final HEP w/min A from wife for improved strength, balance, transfers and gait.  Baseline:  Goal status: INITIAL  5.  Pt will ambulate >500' on level and unlevel surfaces w/LRAD and S* for improved functional mobility and independence  Baseline:  Goal status: INITIAL    PLAN: PT FREQUENCY: 2x/week (recert)  PT DURATION: 8 weeks (recert)  PLANNED INTERVENTIONS: Therapeutic exercises, Therapeutic activity, Neuromuscular re-education, Balance training, Gait training, Patient/Family education, Self Care, Joint mobilization, Joint manipulation, Stair training, DME instructions, Aquatic Therapy, Dry Needling, Electrical stimulation, Cryotherapy, Moist heat, Compression bandaging, Taping, Ultrasound, Biofeedback, Ionotophoresis 4mg /ml Dexamethasone, Manual therapy, and Re-evaluation  PLAN FOR NEXT SESSION: Check goals or defer them (pt not here for past month). refer to Dr. Carles Collet. initiate HEP if wife present, balance (especially with eyes closed), unlevel surfaces, education on PD/PSP, LE strengthening in pain-free range, SciFit, floor transfer, review car transfer    Spink, PT, DPT  09/03/2022, 9:32 AM

## 2022-09-03 NOTE — Addendum Note (Signed)
Addended byVenita Lick, Lonell Stamos on: 09/03/2022 02:43 PM   Modules accepted: Orders

## 2022-09-03 NOTE — Therapy (Signed)
OUTPATIENT SPEECH LANGUAGE PATHOLOGY PARKINSON'S EVALUATION   Patient Name: Joel Webb MRN: [id number] DOB:[birth year], 76 y.o., male Today's Date: 09/03/2022  PCP: Antony Contras REFERRING PROVIDER: Andrey Spearman   End of Session - 09/03/22 1203     Number of Visits 16    Date for SLP Re-Evaluation 12/17/22             Past Medical History:  Diagnosis Date   Chronic kidney disease, stage 3 01/11/2021   ED (erectile dysfunction)    Essential hypertension 06/05/2017   Frequent falls    Gait abnormality    Gastroesophageal reflux disease 01/11/2021   Major depressive disorder    Microalbuminuria due to type 2 diabetes mellitus 05/25/2020   Mild cognitive impairment of uncertain or unknown etiology    Mixed hyperlipidemia 02/28/2016   Multiple lacunar infarcts    Plantar fasciitis    Prostatitis    Pure hypercholesterolemia 01/11/2021   PVC's (premature ventricular contractions) 01/28/2022   Uncontrolled type 2 diabetes mellitus with hyperglycemia, with long-term current use of insulin 02/28/2016   Past Surgical History:  Procedure Laterality Date   CIRCUMCISION     COLONOSCOPY     MOUTH SURGERY  01/2020   Patient Active Problem List   Diagnosis Date Noted   Multiple lacunar infarcts    Frequent falls    Gait abnormality    Major depressive disorder 05/30/2022   Mild cognitive impairment of uncertain or unknown etiology    PVC's (premature ventricular contractions) 01/28/2022   Pure hypercholesterolemia 01/11/2021   Gastroesophageal reflux disease 01/11/2021   Chronic kidney disease, stage 3 01/11/2021   Microalbuminuria due to type 2 diabetes mellitus 05/25/2020   Essential hypertension 06/05/2017   Uncontrolled type 2 diabetes mellitus with hyperglycemia, with long-term current use of insulin ([place]) 02/28/2016   Mixed hyperlipidemia 02/28/2016    ONSET DATE: 09/03/22  REFERRING DIAG: Progressive Supranuclear Palsy  THERAPY DIAG:  Parkinson's  disease without dyskinesia, unspecified whether manifestations fluctuate - Plan: SLP modified barium swallow  Cognitive deficits - Plan: SLP modified barium swallow  Rationale for Evaluation and Treatment Rehabilitation  SUBJECTIVE:   SUBJECTIVE STATEMENT: " I fall a lot and have trouble swallowing." Pt accompanied by: significant other  PERTINENT HISTORY: diabetes, hypercholesterolemia, high blood pressure, Parkinsonism  PAIN:  Are you having pain? No  FALLS: Has patient fallen in last 6 months?  Yes, Number of falls: 3  LIVING ENVIRONMENT: Lives with: lives with their family and lives with their spouse Lives in: House/apartment  PLOF:  Level of assistance: Needed assistance with IADLS Employment: Retired  PATIENT GOALS : "Not to fall down as much"  OBJECTIVE:  DIAGNOSTIC FINDINGS: cognitive and swallow deficits.  COGNITION: Overall cognitive status: Impaired Areas of impairment: Attention, Memory, Awareness, and Problem solving Comments: mild deficits    DYSPHAGIA TREATMENT:   Current diet: regular and thin liquids Swallow precautions: small bites, small sips, chew thoroughly, hard swallow, limit distractions while eating, and avoid mixed consistencies Patient directly observed with POs: Yes: regular, dysphagia 3 (soft), and thin liquids  Feeding: able to feed self Liquids provided by: cup and straw Oral phase signs and symptoms: prolonged bolus formation Pharyngeal phase signs and symptoms: suspected delayed swallow initiation, delayed throat clear, and immediate cough Therapeutic exercises: Effortful Swallow Types of cueing: verbal and visual Amount of cueing: maximal Treatment comments: Pt demonstrated delayed throat clear and immediate cough with mixed consistencies only. Lingual pumping observed.      MOTOR SPEECH: Overall motor speech: Appears  intact Level of impairment: Conversation Respiration: diaphragmatic/abdominal breathing Phonation:  normal Resonance: WFL Articulation: Appears intact Intelligibility: Intelligible Motor planning: Appears intact Motor speech errors:    Interfering components: hearing loss Effective technique: slow rate  ORAL MOTOR EXAMINATION Overall status: Impaired:   Lingual: Bilateral (Coordination) Comments: reduced pa/ta/ka rate    Pt does report difficulty with swallowing which does warrant further evaluation. SLP ordered MBS to further assess swallow function. Pt and pt's wife report coughing episodes of all textures for over a year.   PATIENT REPORTED OUTCOME MEASURES (PROM): Communication Participation Item Bank: provided for homework  TODAY'S TREATMENT:  09-03-22: SLP administered cognitive linguistic assessment CLQT and BSE. Pt and pt's wife report no acute changes in speech and voice. Pt reports difficulty with cognitive skills, word finding and swallow function. SLP recommended MBS to further assess swallow function and continued current diet regular textures and thin liquids with precautions effortful/intental swallow and to separate solids/liquids. SLP and pt created plan and all questions answered to satisfaction.   PATIENT EDUCATION: Education details: swallow strategies and need for MBS to further assess swallow function.  Person educated: Patient and Spouse Education method: Customer service manager Education comprehension: verbalized understanding   HOME EXERCISE PROGRAM: HEP will be created next session due to time restraints.    GOALS: Goals reviewed with patient? Yes  SHORT TERM GOALS: Target date: 10/01/2022    Pt will demonstrate mildly complex problem solving/executive function skills with supervision A verbal cues for 80% accuracy.  Baseline: Goal status:  INITIAL 2.  Pt will demonstrate use of internal/external recall strategies in functional tasks with min A verbal cues for 80% accuracy. Baseline:  Goal status: INITIAL  3.  Pt will demonstrate  sustained and selective attention in functional task for 10-15 minute intervals with supervision A verbal cues for redirection.  Baseline:  Goal status: INITIAL  4.  Pt will self-monitor and self-correct functional errors with min A verbal cues for 80% accuracy. Baseline:  Goal status: INITIAL  5.  Pt will demonstrate word finding skills in structured and unstructured tasks with min A verbal cues for 80% accuracy.  Baseline:  Goal status: INITIAL  6.  Pt will participate in instrumental swallow assessment for further assessment of swallow function.  Baseline:  Goal status: INITIAL  LONG TERM GOALS: Target date: 10/29/2022    Pt will demonstrate mildly complex to complex problem solving skills with supervision A verbal cues for 90% accuracy.  Baseline:  Goal status: INITIAL  2.  Pt will demonstrate use of internal/external recall strategies in functional tasks with supervision A verbal cues for 80% accuracy. Baseline:  Goal status: INITIAL  3.   Pt will demonstrate sustained and selective attention in functional task for 15-20 minute intervals with supervision A verbal cues for redirection. Baseline:  Goal status: INITIAL  4.    Pt will self-monitor and self-correct functional errors with supervision A verbal cues for 80% accuracy. Baseline:  Goal status: INITIAL  5.  Pt will demonstrate word finding skills in structured and unstructured tasks with supervision A verbal cues for 80% accuracy.  Baseline:  Goal status: INITIAL  6.  Pt will consume least restrictive diet with supervision A verbal cues for strategies and exercises to improvement swallow function.  Baseline:  Goal status: INITIAL  ASSESSMENT:  CLINICAL IMPRESSION: Patient is a 76 y.o. male who was seen today for cognitive linguistics and swallow evaluation. Pt presents with mild cognitive linguistic deficits supports by score on CLQT indicating mild  deficits in executive function and visual spatial awareness and  WFL (but at the low end) in attention, memory and language. Pt supports changes since PSP diagnosis in the above mention areas. Pt expressed difficulty swallowing, often coughing on thin liquids and solids for the past year with no h/o of PNA. Oral motor exam indicated repetitive nonfunctional lingual movements and reduced lingual coordination, with remaining areas being [facility]. Pt consumed thin liquids via straw ([place] 3oz water test) and regular textures with only mild reduced bolus formation (on regular textures) and possible delay in swallow initiation and x1 delayed throat clear on thin liquids (pt's vocal quality appeared dry after occurrence.) Pt demonstrated reduced bolus cohesion on mixed consistencies, possible delay in swallow initiation and immediate coughing episode. SLP recommends continuing regular textures and thin liquid diet with strict instructions to separate consistencies along with a slow rate and small bites/sips. Pt and pt's wife agreed and supported no acute changes in speech function. Pt would benefit from skilled ST services in order to maximize functional skills and reduce burden of care.   OBJECTIVE IMPAIRMENTS  Objective impairments include attention, memory, awareness, executive functioning, expressive language, and dysphagia. These impairments are limiting patient from managing appointments, managing finances, household responsibilities, and safety when swallowing.Factors affecting potential to achieve goals and functional outcome are ability to learn/carryover information, co-morbidities, and severity of impairments.. Patient will benefit from skilled SLP services to address above impairments and improve overall function.  REHAB POTENTIAL: Good  PLAN: SLP FREQUENCY: 2x/week  SLP DURATION: 8 weeks  PLANNED INTERVENTIONS: Aspiration precaution training, Pharyngeal strengthening exercises, Diet toleration management , Trials of upgraded texture/liquids, Cueing hierachy,  Cognitive reorganization, Internal/external aids, SLP instruction and feedback, Compensatory strategies, and Patient/family education    [place], [place] 09/03/2022, 2:40 PM

## 2022-09-04 ENCOUNTER — Telehealth (HOSPITAL_COMMUNITY): Payer: Self-pay

## 2022-09-04 ENCOUNTER — Other Ambulatory Visit (HOSPITAL_COMMUNITY): Payer: Self-pay

## 2022-09-04 DIAGNOSIS — R1310 Dysphagia, unspecified: Secondary | ICD-10-CM

## 2022-09-04 NOTE — Telephone Encounter (Signed)
Attempted to contact patient to schedule Modified Barium Swallow - left voicemail. 

## 2022-09-05 ENCOUNTER — Ambulatory Visit: Payer: PPO

## 2022-09-05 DIAGNOSIS — Z9181 History of falling: Secondary | ICD-10-CM

## 2022-09-05 DIAGNOSIS — R2681 Unsteadiness on feet: Secondary | ICD-10-CM

## 2022-09-05 DIAGNOSIS — R293 Abnormal posture: Secondary | ICD-10-CM

## 2022-09-05 DIAGNOSIS — R2689 Other abnormalities of gait and mobility: Secondary | ICD-10-CM

## 2022-09-05 DIAGNOSIS — M6281 Muscle weakness (generalized): Secondary | ICD-10-CM

## 2022-09-05 NOTE — Therapy (Signed)
OUTPATIENT PHYSICAL THERAPY NEURO TREATMENT   Patient Name: Joel Webb MRN: [id number] DOB:1946-11-04, 76 y.o., male Today's Date: 09/05/2022   PT End of Session - 09/05/22 1403     Visit Number 7    Number of Visits 21    Date for PT Re-Evaluation 10/09/22    Authorization Type [employer]    Progress Note Due on Visit 10    PT Start Time 1400    PT Stop Time 1442    PT Time Calculation (min) 42 min    Activity Tolerance Patient tolerated treatment well    Behavior During Therapy [facility] for tasks assessed/performed;Impulsive            Past Medical History:  Diagnosis Date   Chronic kidney disease, stage 3 01/11/2021   ED (erectile dysfunction)    Essential hypertension 06/05/2017   Frequent falls    Gait abnormality    Gastroesophageal reflux disease 01/11/2021   Major depressive disorder    Microalbuminuria due to type 2 diabetes mellitus 05/25/2020   Mild cognitive impairment of uncertain or unknown etiology    Mixed hyperlipidemia 02/28/2016   Multiple lacunar infarcts    Plantar fasciitis    Prostatitis    Pure hypercholesterolemia 01/11/2021   PVC's (premature ventricular contractions) 01/28/2022   Uncontrolled type 2 diabetes mellitus with hyperglycemia, with long-term current use of insulin 02/28/2016   Past Surgical History:  Procedure Laterality Date   CIRCUMCISION     COLONOSCOPY     MOUTH SURGERY  01/2020   Patient Active Problem List   Diagnosis Date Noted   Multiple lacunar infarcts    Frequent falls    Gait abnormality    Major depressive disorder 05/30/2022   Mild cognitive impairment of uncertain or unknown etiology    PVC's (premature ventricular contractions) 01/28/2022   Pure hypercholesterolemia 01/11/2021   Gastroesophageal reflux disease 01/11/2021   Chronic kidney disease, stage 3 01/11/2021   Microalbuminuria due to type 2 diabetes mellitus 05/25/2020   Essential hypertension 06/05/2017   Uncontrolled type 2  diabetes mellitus with hyperglycemia, with long-term current use of insulin ([place]) 02/28/2016   Mixed hyperlipidemia 02/28/2016    PCP: Antony Contras, MD  REFERRING PROVIDER: Penni Bombard, MD  ONSET DATE: 07/03/2022   REFERRING DIAG: G20 (ICD-10-CM) - Parkinsonism, unspecified Parkinsonism type ([place]); R29.6 (ICD-10-CM) - Frequent falls; R26.9 (ICD-10-CM) - Gait abnormality   THERAPY DIAG:  Unsteadiness on feet  History of falling  Other abnormalities of gait and mobility  Muscle weakness (generalized)  Abnormal posture  Rationale for Evaluation and Treatment Rehabilitation  SUBJECTIVE:   SUBJECTIVE STATEMENT: Patient reports doing better- feeling better. Wife reporting that patient "stumbled" getting into the car on the way here- tried stepping to car with 1 leg, not sitting first then swiveling. Patient initially stating that he didn't fall, but wife confirming that he has fallen 3 times. Patients wife leaving town next week and has taken multiple safely precautions including having neighbors check in on patient, turning off the gas, changing out the coffee pot and using a life alert.   PERTINENT HISTORY: diabetes, hypercholesterolemia, high blood pressure, Parkinsonism (suspected PSP)   PAIN:  Are you having pain? No  PRECAUTIONS: Fall  OCCUPATION: retired, does a of computer work or reads, naps  PLOF: Independent with gait and Independent with transfers, occasional SPC use  PATIENT GOALS "to get where it doesn't hurt when I go up and down the steps"   OBJECTIVE:   DIAGNOSTIC FINDINGS:  IMPRESSION: (L knee xray 06/27/22) Minimal medial compartment osteoarthritis.  IMPRESSION: (R knee xray 06/27/22) Minimal medial and patellofemoral osteoarthritis.  COGNITION:  Overall cognitive status: Impaired and Difficulty to assess due to: no family present     TODAY'S TREATMENT: NMR: -scifit hills level 2 x10 mins B UE/LE   -Blocked practice car transfer  simulation using scifit seat    THEREX:   -HEP (see below)   -instructed patient to complete HEP with supervision and assist for safety   PATIENT EDUCATION:  Education details: HEP, car transfer, home safety while wife will be out of town Person educated: Patient Education method: Explanation Education comprehension: verbalized understanding and needs further education   HOME EXERCISE PROGRAM: Access Code: [email or web address] URL: [email or web address] Date: 09/05/2022 Prepared by: Estevan Ryder  Exercises - Supine Bridge  - 1 x daily - 7 x weekly - 3 sets - 10 reps - Seated Hamstring Stretch  - 1 x daily - 7 x weekly - 3 sets - 30s hold - Scapular Retraction with Resistance  - 1 x daily - 7 x weekly - 3 sets - 10 reps - Sit to Stand  - 1 x daily - 7 x weekly - 3 sets - 10 reps - Side Stepping with Resistance at Ankles  - 1 x daily - 7 x weekly - 3 sets - 10 reps   ASSESSMENT:  CLINICAL IMPRESSION: Patient seen for skilled PT session with emphasis on establishing HEP. Patient remaining impulsive requiring mutlimodal cues to complete therapeutic tasks safely and once all directions are given. Patient to complete HEP with someone else present to assist- patient and wife verbalized understanding. Discussed safe car transfers and patient able to demonstrate back after blocked practice with simulated car transfer. Continues to report multiple falls/near falls likely due to general impulsivity, poor safety awareness and poor balance strategies. Continue POC.   OBJECTIVE IMPAIRMENTS Abnormal gait, decreased balance, increased edema, impaired perceived functional ability, and pain.   ACTIVITY LIMITATIONS lifting, bending, squatting, stairs, and transfers  PARTICIPATION LIMITATIONS: cleaning, driving, community activity, and yard work  PERSONAL FACTORS 1-2 comorbidities:    diabetes, hypercholesterolemia, high blood pressure, and possible Parkinsonism are also affecting patient's  functional outcome.   REHAB POTENTIAL: Fair due to impaired cognition and lack of family support  CLINICAL DECISION MAKING: Stable/uncomplicated  EVALUATION COMPLEXITY: Low   GOALS: Goals reviewed with patient? Yes   NEW SHORT TERM GOALS FOR UPDATED POC:   Target date: 09/04/2022  Pt will perform initial HEP with min A from wife for improved strength, balance, transfers and gait.  Baseline: not yet established  Goal status: NOT MET  2. Patient will improve MiniBESTest score to >/= 18/28 to demonstrate decreased risk for falling Baseline: 14/28 Goal status: INITIAL  3.  Pt will improve 5x STS to </= 9 sec to demo improved functional LE strength and balance  Baseline: 12.8; 15.69s without UE support  Goal status: NOT MET  4.  Pt and wife will verbalize and demonstrate fall prevention techniques in the home and community setting for reduced fall risk and maintained independence  Baseline:  Goal status: MET  5.  Pt will ambulate >300' on level and unlevel surfaces w/LRAD and min A for improved functional mobility and safety w/ambulation  Baseline:  Goal status: INITIAL  NEW LONG TERM GOALS FOR UPDATED POC:  Target date: 10/02/2022  Pt will perform floor transfers w/S* for improved fall recovery and safety at home  Baseline:  Goal status: INITIAL  2.  Pt will ascend/descend 12 steps using single rail either sideways or step-to pattern w/S* for improved household mobility and safety  Baseline:  Goal status: INITIAL  3.  Patient will improve MiniBESTest score to >/=22/28 to demonstrate decreased risk for falling Baseline: 18/28 Goal status: INITIAL  4.  Pt will perform final HEP w/min A from wife for improved strength, balance, transfers and gait.  Baseline:  Goal status: INITIAL  5.  Pt will ambulate >500' on level and unlevel surfaces w/LRAD and S* for improved functional mobility and independence  Baseline:  Goal status: INITIAL    PLAN: PT FREQUENCY: 2x/week  (recert)  PT DURATION: 8 weeks (recert)  PLANNED INTERVENTIONS: Therapeutic exercises, Therapeutic activity, Neuromuscular re-education, Balance training, Gait training, Patient/Family education, Self Care, Joint mobilization, Joint manipulation, Stair training, DME instructions, Aquatic Therapy, Dry Needling, Electrical stimulation, Cryotherapy, Moist heat, Compression bandaging, Taping, Ultrasound, Biofeedback, Ionotophoresis 4mg /ml Dexamethasone, Manual therapy, and Re-evaluation  PLAN FOR NEXT SESSION: Check goals or defer them (pt not here for past month). refer to Dr. Carles Collet.  balance (especially with eyes closed), unlevel surfaces, education on PD/PSP, LE strengthening in pain-free range, SciFit, floor transfer/FINISH GOAL ASSESSMENT   Debbora Dus, PT, DPT  Debbora Dus, PT, DPT, CBIS  09/05/2022, 2:56 PM

## 2022-09-10 ENCOUNTER — Encounter: Payer: Self-pay | Admitting: Physical Therapy

## 2022-09-10 ENCOUNTER — Ambulatory Visit: Payer: PPO | Admitting: Speech Pathology

## 2022-09-10 ENCOUNTER — Ambulatory Visit: Payer: PPO | Admitting: Physical Therapy

## 2022-09-10 DIAGNOSIS — Z9181 History of falling: Secondary | ICD-10-CM

## 2022-09-10 DIAGNOSIS — M6281 Muscle weakness (generalized): Secondary | ICD-10-CM

## 2022-09-10 DIAGNOSIS — R4189 Other symptoms and signs involving cognitive functions and awareness: Secondary | ICD-10-CM

## 2022-09-10 DIAGNOSIS — R2689 Other abnormalities of gait and mobility: Secondary | ICD-10-CM

## 2022-09-10 DIAGNOSIS — R1310 Dysphagia, unspecified: Secondary | ICD-10-CM

## 2022-09-10 DIAGNOSIS — R2681 Unsteadiness on feet: Secondary | ICD-10-CM

## 2022-09-10 DIAGNOSIS — R293 Abnormal posture: Secondary | ICD-10-CM

## 2022-09-10 NOTE — Therapy (Signed)
OUTPATIENT SPEECH LANGUAGE PATHOLOGY [facility] EVALUATION   Patient Name: DELSIN COPEN MRN: [id number] DOB:26-Aug-1946, 76 y.o., male Today's Date: 09/10/2022  PCP: Antony Contras REFERRING PROVIDER: Andrey Spearman   End of Session - 09/10/22 1430     Visit Number 2    Number of Visits 16    Date for SLP Re-Evaluation 10/29/22    Authorization Type [employer]    SLP Start Time 1445    SLP Stop Time  3299    SLP Time Calculation (min) 45 min    Activity Tolerance Patient tolerated treatment well             Past Medical History:  Diagnosis Date   Chronic kidney disease, stage 3 01/11/2021   ED (erectile dysfunction)    Essential hypertension 06/05/2017   Frequent falls    Gait abnormality    Gastroesophageal reflux disease 01/11/2021   Major depressive disorder    Microalbuminuria due to type 2 diabetes mellitus 05/25/2020   Mild cognitive impairment of uncertain or unknown etiology    Mixed hyperlipidemia 02/28/2016   Multiple lacunar infarcts    Plantar fasciitis    Prostatitis    Pure hypercholesterolemia 01/11/2021   PVC's (premature ventricular contractions) 01/28/2022   Uncontrolled type 2 diabetes mellitus with hyperglycemia, with long-term current use of insulin 02/28/2016   Past Surgical History:  Procedure Laterality Date   CIRCUMCISION     COLONOSCOPY     MOUTH SURGERY  01/2020   Patient Active Problem List   Diagnosis Date Noted   Multiple lacunar infarcts    Frequent falls    Gait abnormality    Major depressive disorder 05/30/2022   Mild cognitive impairment of uncertain or unknown etiology    PVC's (premature ventricular contractions) 01/28/2022   Pure hypercholesterolemia 01/11/2021   Gastroesophageal reflux disease 01/11/2021   Chronic kidney disease, stage 3 01/11/2021   Microalbuminuria due to type 2 diabetes mellitus 05/25/2020   Essential hypertension 06/05/2017   Uncontrolled type 2 diabetes mellitus with  hyperglycemia, with long-term current use of insulin ([place]) 02/28/2016   Mixed hyperlipidemia 02/28/2016    ONSET DATE: 09/03/22  REFERRING DIAG: Progressive Supranuclear Palsy  THERAPY DIAG:  Cognitive deficits  Dysphagia, unspecified type  Rationale for Evaluation and Treatment Rehabilitation  SUBJECTIVE:   SUBJECTIVE STATEMENT: Report to choking at lunch, washing burger down with soda.   PAIN:  Are you having pain? No   OBJECTIVE:  PATIENT REPORTED OUTCOME MEASURES (PROM): Communication Participation Item Bank: 34 ; wife strongly disagrees with pt report Pt rates having long conversation, giving detailed information, getting turn in fast conversation as quite a bit.   TODAY'S TREATMENT:  09-10-22: Re-education provided on swallow precautions and use of intentional swallow. Recommend limiting distractions at meal times to aid in focus. Education on PSP impact on swallow, to include saliva management. Pt reports to increased drooling and difficulty with "nasal drip." SLP suspects some pharyngeal residue sensation may be 2/2 reduced volitional swallow for saliva management. Recommend strategy of either chewing gum or intentional swallow prior to speaking. Generated strategy to A in recall of turning off burners (red tape in off position) and checking blood pressure when waking (making part of routine) as solutions to pt reported issues currently affecting successful participation. Will plan to trial errorless learning to aid in visual scanning of stove dials next session.    09-03-22: SLP administered cognitive linguistic assessment CLQT and BSE. Pt and pt's wife report no acute changes in speech and  voice. Pt reports difficulty with cognitive skills, word finding and swallow function. SLP recommended MBS to further assess swallow function and continued current diet regular textures and thin liquids with precautions effortful/intental swallow and to separate solids/liquids. SLP and pt  created plan and all questions answered to satisfaction.   PATIENT EDUCATION: Education details: swallow strategies and need for MBS to further assess swallow function.  Person educated: Patient and Spouse Education method: Customer service manager Education comprehension: verbalized understanding   HOME EXERCISE PROGRAM: HEP will be created next session due to time restraints.    GOALS: Goals reviewed with patient? Yes  SHORT TERM GOALS: Target date: 10/01/2022    Pt will demonstrate mildly complex problem solving/executive function skills with supervision A verbal cues for 80% accuracy.  Baseline: Goal status: IN PROGRESS  2.  Pt will demonstrate use of internal/external recall strategies in functional tasks with min A verbal cues for 80% accuracy. Baseline:  Goal status: IN PROGRESS  3.  Pt will demonstrate sustained and selective attention in functional task for 10-15 minute intervals with supervision A verbal cues for redirection.  Baseline:  Goal status: IN PROGRESS  4.  Pt will self-monitor and self-correct functional errors with min A verbal cues for 80% accuracy. Baseline:  Goal status: IN PROGRESS  5.  Pt will demonstrate word finding skills in structured and unstructured tasks with min A verbal cues for 80% accuracy.  Baseline:  Goal status: IN PROGRESS  6.  Pt will participate in instrumental swallow assessment for further assessment of swallow function.  Baseline:  Goal status: IN PROGRESS  LONG TERM GOALS: Target date: 10/29/2022    Pt will demonstrate mildly complex to complex problem solving skills with supervision A verbal cues for 90% accuracy.  Baseline:  Goal status: IN PROGRESS  2.  Pt will demonstrate use of internal/external recall strategies in functional tasks with supervision A verbal cues for 80% accuracy. Baseline:  Goal status: IN PROGRESS  3.   Pt will demonstrate sustained and selective attention in functional task for 15-20  minute intervals with supervision A verbal cues for redirection. Baseline:  Goal status: IN PROGRESS  4.    Pt will self-monitor and self-correct functional errors with supervision A verbal cues for 80% accuracy. Baseline:  Goal status: IN PROGRESS  5.  Pt will demonstrate word finding skills in structured and unstructured tasks with supervision A verbal cues for 80% accuracy.  Baseline:  Goal status: IN PROGRESS  6.  Pt will consume least restrictive diet with supervision A verbal cues for strategies and exercises to improvement swallow function.  Baseline:  Goal status: IN PROGRESS  ASSESSMENT:  CLINICAL IMPRESSION: Patient is a 76 y.o. male who was seen today for cognitive linguistics and swallow evaluation. Pt presents with mild cognitive linguistic deficits supports by score on CLQT indicating mild deficits in executive function and visual spatial awareness and WFL (but at the low end) in attention, memory and language. Pt supports changes since PSP diagnosis in the above mention areas. Pt expressed difficulty swallowing, often coughing on thin liquids and solids for the past year with no h/o of PNA. Oral motor exam indicated repetitive nonfunctional lingual movements and reduced lingual coordination, with remaining areas being [facility]. Pt consumed thin liquids via straw ([place] 3oz water test) and regular textures with only mild reduced bolus formation (on regular textures) and possible delay in swallow initiation and x1 delayed throat clear on thin liquids (pt's vocal quality appeared dry after occurrence.) Pt demonstrated  reduced bolus cohesion on mixed consistencies, possible delay in swallow initiation and immediate coughing episode. SLP recommends continuing regular textures and thin liquid diet with strict instructions to separate consistencies along with a slow rate and small bites/sips. Pt and pt's wife agreed and supported no acute changes in speech function. Pt would benefit from skilled  ST services in order to maximize functional skills and reduce burden of care.   OBJECTIVE IMPAIRMENTS  Objective impairments include attention, memory, awareness, executive functioning, expressive language, and dysphagia. These impairments are limiting patient from managing appointments, managing finances, household responsibilities, and safety when swallowing.Factors affecting potential to achieve goals and functional outcome are ability to learn/carryover information, co-morbidities, and severity of impairments.. Patient will benefit from skilled SLP services to address above impairments and improve overall function.  REHAB POTENTIAL: Good  PLAN: SLP FREQUENCY: 2x/week  SLP DURATION: 8 weeks  PLANNED INTERVENTIONS: Aspiration precaution training, Pharyngeal strengthening exercises, Diet toleration management , Trials of upgraded texture/liquids, Cueing hierachy, Cognitive reorganization, Internal/external aids, SLP instruction and feedback, Compensatory strategies, and Patient/family education    Su Monks, [place] 09/10/2022, 2:31 PM

## 2022-09-10 NOTE — Therapy (Signed)
OUTPATIENT PHYSICAL THERAPY NEURO TREATMENT   Patient Name: Joel Webb MRN: [id number] DOB:[birth year], 76 y.o., male Today's Date: 09/10/2022   PT End of Session - 09/10/22 1411     Visit Number 8    Number of Visits 21    Date for PT Re-Evaluation 10/09/22    Authorization Type [employer]    Progress Note Due on Visit 10    PT Start Time 1410   pt still checking in upon PT entrance to lobby   PT Stop Time 1445    PT Time Calculation (min) 35 min    Equipment Utilized During Treatment Gait belt    Activity Tolerance Patient tolerated treatment well    Behavior During Therapy [facility] for tasks assessed/performed;Impulsive            Past Medical History:  Diagnosis Date   Chronic kidney disease, stage 3 01/11/2021   ED (erectile dysfunction)    Essential hypertension 06/05/2017   Frequent falls    Gait abnormality    Gastroesophageal reflux disease 01/11/2021   Major depressive disorder    Microalbuminuria due to type 2 diabetes mellitus 05/25/2020   Mild cognitive impairment of uncertain or unknown etiology    Mixed hyperlipidemia 02/28/2016   Multiple lacunar infarcts    Plantar fasciitis    Prostatitis    Pure hypercholesterolemia 01/11/2021   PVC's (premature ventricular contractions) 01/28/2022   Uncontrolled type 2 diabetes mellitus with hyperglycemia, with long-term current use of insulin 02/28/2016   Past Surgical History:  Procedure Laterality Date   CIRCUMCISION     COLONOSCOPY     MOUTH SURGERY  01/2020   Patient Active Problem List   Diagnosis Date Noted   Multiple lacunar infarcts    Frequent falls    Gait abnormality    Major depressive disorder 05/30/2022   Mild cognitive impairment of uncertain or unknown etiology    PVC's (premature ventricular contractions) 01/28/2022   Pure hypercholesterolemia 01/11/2021   Gastroesophageal reflux disease 01/11/2021   Chronic kidney disease, stage 3 01/11/2021   Microalbuminuria due to type  2 diabetes mellitus 05/25/2020   Essential hypertension 06/05/2017   Uncontrolled type 2 diabetes mellitus with hyperglycemia, with long-term current use of insulin ([place]) 02/28/2016   Mixed hyperlipidemia 02/28/2016    PCP: Antony Contras, MD  REFERRING PROVIDER: Penni Bombard, MD  ONSET DATE: 07/03/2022   REFERRING DIAG: G20 (ICD-10-CM) - Parkinsonism, unspecified Parkinsonism type ([place]); R29.6 (ICD-10-CM) - Frequent falls; R26.9 (ICD-10-CM) - Gait abnormality   THERAPY DIAG:  Unsteadiness on feet  History of falling  Other abnormalities of gait and mobility  Muscle weakness (generalized)  Abnormal posture  Rationale for Evaluation and Treatment Rehabilitation  SUBJECTIVE:   SUBJECTIVE STATEMENT: Wife reports pt has not done any of his exercises.  She states they have a stair lift w/ a chair lift to help him get out of the chair.  He denies falls or stumblings (confirmed w/ wife).  Wife is going out of town tomorrow.  She further endorses precautions taken lift stair lift installed, check-ins, someone to bring to therapy, gas off, and meals prepared.  PERTINENT HISTORY: diabetes, hypercholesterolemia, high blood pressure, Parkinsonism (suspected PSP)   PAIN:  Are you having pain? No  PRECAUTIONS: Fall  OCCUPATION: retired, does a of computer work or reads, naps  PLOF: Independent with gait and Independent with transfers, occasional SPC use  PATIENT GOALS "to get where it doesn't hurt when I go up and down the steps"  OBJECTIVE:   DIAGNOSTIC FINDINGS:  IMPRESSION: (L knee xray 06/27/22) Minimal medial compartment osteoarthritis.  IMPRESSION: (R knee xray 06/27/22) Minimal medial and patellofemoral osteoarthritis.  COGNITION:  Overall cognitive status: Impaired and Difficulty to assess due to: no family present     TODAY'S TREATMENT: Assessed MiniBEST:  [facility] PT Assessment - 09/10/22 1415       Mini-BESTest   Sit To Stand Normal: Comes to stand  without use of hands and stabilizes independently.    Rise to Toes < 3 s.    Stand on one leg (left) Moderate: < 20 s   6 sec   Stand on one leg (right) Moderate: < 20 s   8 sec   Stand on one leg - lowest score 1    Compensatory Stepping Correction - Forward Moderate: More than one step is required to recover equilibrium    Compensatory Stepping Correction - Backward Moderate: More than one step is required to recover equilibrium   multiple attempts w/ varied cuing to get pt to lean into therapist.   Compensatory Stepping Correction - Left Lateral Moderate: Several steps to recover equilibrium    Compensatory Stepping Correction - Right Lateral Moderate: Several steps to recover equilibrium   multiple attempts w/ varied cuing to get pt to lean into therapist.   Stepping Corredtion Lateral - lowest score 1    Stance - Feet together, eyes open, firm surface  Normal: 30s    Stance - Feet together, eyes closed, foam surface  Moderate: < 30s   2 sec w/ major right posterolateral LOB   Incline - Eyes Closed Moderate: Stands independently < 30s OR aligns with surface   attempts to align w/ gravity, waivers, no LOB   Change in Gait Speed Moderate: Unable to change walking speed or signs of imbalance    Walk with head turns - Horizontal Moderate: performs head turns with reduction in gait speed.    Walk with pivot turns Moderate:Turns with feet close SLOW (>4 steps) with good balance.    Step over obstacles Normal: Able to step over box with minimal change of gait speed and with good balance.   takes large step, no LOB noted   Timed UP & GO with Dual Task Moderate: Dual Task affects either counting OR walking (>10%) when compared to the TUG without Dual Task.   TUG 11.81 sec, dual task TUG 13.38 sec multiple counting errors   Mini-BEST total score 16            Floor recovery: Reviewed floor recovery, repeated edu on assessment post-fall for injury and using life-alert vs calling wife or  non-emergency fire dept if wife not home.  Pt reluctant to wear life-alert or carry cell phone.  Pt also demonstrates decreased immediate carryover/recall of 3 step instructions for fall recovery.  PATIENT EDUCATION:  Education details: Continue HEP with supervision, safety with floor recovery, answered questions in relation to wife being out of town. Person educated: Patient Education method: Explanation Education comprehension: verbalized understanding and needs further education   HOME EXERCISE PROGRAM: Access Code: [email or web address] URL: [email or web address] Date: 09/05/2022 Prepared by: Estevan Ryder  Exercises - Supine Bridge  - 1 x daily - 7 x weekly - 3 sets - 10 reps - Seated Hamstring Stretch  - 1 x daily - 7 x weekly - 3 sets - 30s hold - Scapular Retraction with Resistance  - 1 x daily - 7 x weekly - 3 sets - 10 reps - Sit to  Stand  - 1 x daily - 7 x weekly - 3 sets - 10 reps - Side Stepping with Resistance at Ankles  - 1 x daily - 7 x weekly - 3 sets - 10 reps   ASSESSMENT:  CLINICAL IMPRESSION: Patient has not been performing HEP.  Time spent this session assessing miniBEST w/ pt improving score to 16/28 demonstrating increased difficulty with dual task this session compared to evaluation.  He continues to be impulsive during all activities, but is physically able to perform fall recovery without issues.  He is unable to immediately recall steps for safe recovery, but demonstrates mild automaticity of physical performance after cues for initiation.  Will continue per POC.  OBJECTIVE IMPAIRMENTS Abnormal gait, decreased balance, increased edema, impaired perceived functional ability, and pain.   ACTIVITY LIMITATIONS lifting, bending, squatting, stairs, and transfers  PARTICIPATION LIMITATIONS: cleaning, driving, community activity, and yard work  PERSONAL FACTORS 1-2 comorbidities:    diabetes, hypercholesterolemia, high blood pressure, and possible Parkinsonism  are also affecting patient's functional outcome.   REHAB POTENTIAL: Fair due to impaired cognition and lack of family support  CLINICAL DECISION MAKING: Stable/uncomplicated  EVALUATION COMPLEXITY: Low   GOALS: Goals reviewed with patient? Yes   NEW SHORT TERM GOALS FOR UPDATED POC:   Target date: 09/04/2022  Pt will perform initial HEP with min A from wife for improved strength, balance, transfers and gait.  Baseline: not yet established  Goal status: NOT MET  2. Patient will improve MiniBESTest score to >/= 18/28 to demonstrate decreased risk for falling Baseline: 14/28; (10/24) 16/28 Goal status: NOT MET  3.  Pt will improve 5x STS to </= 9 sec to demo improved functional LE strength and balance  Baseline: 12.8; 15.69s without UE support  Goal status: NOT MET  4.  Pt and wife will verbalize and demonstrate fall prevention techniques in the home and community setting for reduced fall risk and maintained independence  Baseline: pt unable to immediately recall steps Goal status: NOT MET  5.  Pt will ambulate >300' on level and unlevel surfaces w/LRAD and min A for improved functional mobility and safety w/ambulation  Baseline:  Goal status: INITIAL  NEW LONG TERM GOALS FOR UPDATED POC:  Target date: 10/02/2022  Pt will perform floor transfers w/S* for improved fall recovery and safety at home  Baseline:  Goal status: INITIAL  2.  Pt will ascend/descend 12 steps using single rail either sideways or step-to pattern w/S* for improved household mobility and safety  Baseline:  Goal status: INITIAL  3.  Patient will improve MiniBESTest score to >/=22/28 to demonstrate decreased risk for falling Baseline: 18/28 Goal status: INITIAL  4.  Pt will perform final HEP w/min A from wife for improved strength, balance, transfers and gait.  Baseline:  Goal status: INITIAL  5.  Pt will ambulate >500' on level and unlevel surfaces w/LRAD and S* for improved functional mobility  and independence  Baseline:  Goal status: INITIAL    PLAN: PT FREQUENCY: 2x/week (recert)  PT DURATION: 8 weeks (recert)  PLANNED INTERVENTIONS: Therapeutic exercises, Therapeutic activity, Neuromuscular re-education, Balance training, Gait training, Patient/Family education, Self Care, Joint mobilization, Joint manipulation, Stair training, DME instructions, Aquatic Therapy, Dry Needling, Electrical stimulation, Cryotherapy, Moist heat, Compression bandaging, Taping, Ultrasound, Biofeedback, Ionotophoresis 4mg /ml Dexamethasone, Manual therapy, and Re-evaluation  PLAN FOR NEXT SESSION: Check goals or defer them (pt not here for past month). refer to Dr. Carles Collet.  balance (especially with eyes closed), unlevel surfaces, education on  PD/PSP, LE strengthening in pain-free range, SciFit, may want to review floor transfer/FINISH short term GOAL ASSESSMENT   Bary Richard, PT, DPT   09/10/2022, 4:32 PM

## 2022-09-12 ENCOUNTER — Ambulatory Visit: Payer: PPO

## 2022-09-12 DIAGNOSIS — R293 Abnormal posture: Secondary | ICD-10-CM

## 2022-09-12 DIAGNOSIS — R2689 Other abnormalities of gait and mobility: Secondary | ICD-10-CM

## 2022-09-12 DIAGNOSIS — R2681 Unsteadiness on feet: Secondary | ICD-10-CM

## 2022-09-12 DIAGNOSIS — G20A1 Parkinson's disease without dyskinesia, without mention of fluctuations: Secondary | ICD-10-CM

## 2022-09-12 DIAGNOSIS — Z9181 History of falling: Secondary | ICD-10-CM

## 2022-09-12 DIAGNOSIS — M6281 Muscle weakness (generalized): Secondary | ICD-10-CM

## 2022-09-12 NOTE — Therapy (Signed)
OUTPATIENT PHYSICAL THERAPY NEURO TREATMENT   Patient Name: Joel Webb MRN: [id number] DOB:August 29, 1946, 76 y.o., male Today's Date: 09/13/2022   PT End of Session - 09/12/22 1447     Visit Number 9    Number of Visits 21    Date for PT Re-Evaluation 10/09/22    Authorization Type [employer]    Progress Note Due on Visit 10    PT Start Time 1740    PT Stop Time 1528    PT Time Calculation (min) 43 min    Equipment Utilized During Treatment Gait belt    Activity Tolerance Patient tolerated treatment well    Behavior During Therapy WFL for tasks assessed/performed;Impulsive            Past Medical History:  Diagnosis Date   Chronic kidney disease, stage 3 01/11/2021   ED (erectile dysfunction)    Essential hypertension 06/05/2017   Frequent falls    Gait abnormality    Gastroesophageal reflux disease 01/11/2021   Major depressive disorder    Microalbuminuria due to type 2 diabetes mellitus 05/25/2020   Mild cognitive impairment of uncertain or unknown etiology    Mixed hyperlipidemia 02/28/2016   Multiple lacunar infarcts    Plantar fasciitis    Prostatitis    Pure hypercholesterolemia 01/11/2021   PVC's (premature ventricular contractions) 01/28/2022   Uncontrolled type 2 diabetes mellitus with hyperglycemia, with long-term current use of insulin 02/28/2016   Past Surgical History:  Procedure Laterality Date   CIRCUMCISION     COLONOSCOPY     MOUTH SURGERY  01/2020   Patient Active Problem List   Diagnosis Date Noted   Multiple lacunar infarcts    Frequent falls    Gait abnormality    Major depressive disorder 05/30/2022   Mild cognitive impairment of uncertain or unknown etiology    PVC's (premature ventricular contractions) 01/28/2022   Pure hypercholesterolemia 01/11/2021   Gastroesophageal reflux disease 01/11/2021   Chronic kidney disease, stage 3 01/11/2021   Microalbuminuria due to type 2 diabetes mellitus 05/25/2020   Essential  hypertension 06/05/2017   Uncontrolled type 2 diabetes mellitus with hyperglycemia, with long-term current use of insulin ([place]) 02/28/2016   Mixed hyperlipidemia 02/28/2016    PCP: Antony Contras, MD  REFERRING PROVIDER: Penni Bombard, MD  ONSET DATE: 07/03/2022   REFERRING DIAG: G20 (ICD-10-CM) - Parkinsonism, unspecified Parkinsonism type ([place]); R29.6 (ICD-10-CM) - Frequent falls; R26.9 (ICD-10-CM) - Gait abnormality   THERAPY DIAG:  Unsteadiness on feet  History of falling  Other abnormalities of gait and mobility  Muscle weakness (generalized)  Abnormal posture  Parkinson's disease without dyskinesia, unspecified whether manifestations fluctuate  Rationale for Evaluation and Treatment Rehabilitation  SUBJECTIVE:   SUBJECTIVE STATEMENT: Patient arrived with neighbor/friend using SPC. He reports that it "slows him down."   PERTINENT HISTORY: diabetes, hypercholesterolemia, high blood pressure, Parkinsonism (suspected PSP)   PAIN:  Are you having pain? No  PRECAUTIONS: Fall  OCCUPATION: retired, does a of computer work or reads, naps  PLOF: Independent with gait and Independent with transfers, occasional SPC use  PATIENT GOALS "to get where it doesn't hurt when I go up and down the steps"   OBJECTIVE:   DIAGNOSTIC FINDINGS:  IMPRESSION: (L knee xray 06/27/22) Minimal medial compartment osteoarthritis.  IMPRESSION: (R knee xray 06/27/22) Minimal medial and patellofemoral osteoarthritis.  COGNITION:  Overall cognitive status: Impaired and Difficulty to assess due to: no family present     TODAY'S TREATMENT: Theract:  - scifit hills level  3 10 mins B UE/LE for large amplitude reciprocal coordination  - floor recovery with patient able to recall 3/3 checklist items immediately and after 5 mins    -patient educated to assess for hitting his head, excessive bleeding and broken bones if/when he falls (if any of those are present he is to call 911)    THEREX: -components of HEP that patient is safe to do without supervision:   -hooklying bridges 3x10    -seated hamstring stretch 3x30s B    -seated scap retractions 3x10   GAIT:  -115' x3 with SPC + close supervision    -safe gait speed, maintaining slightly wider BOS, but generally more stable  PATIENT EDUCATION:  Education details: Continue HEP with supervision, safety with floor recovery, answered questions in relation to wife being out of town. Person educated: Patient Education method: Explanation Education comprehension: verbalized understanding and needs further education   HOME EXERCISE PROGRAM: Access Code: [email or web address] URL: [email or web address] Date: 09/05/2022 Prepared by: Estevan Ryder  Exercises - Supine Bridge  - 1 x daily - 7 x weekly - 3 sets - 10 reps - Seated Hamstring Stretch  - 1 x daily - 7 x weekly - 3 sets - 30s hold - Scapular Retraction with Resistance  - 1 x daily - 7 x weekly - 3 sets - 10 reps - Sit to Stand  - 1 x daily - 7 x weekly - 3 sets - 10 reps - Side Stepping with Resistance at Ankles  - 1 x daily - 7 x weekly - 3 sets - 10 reps   ASSESSMENT:  CLINICAL IMPRESSION: Patient seen for skilled PT session with emphasis on reviewing floor transfer/fall precautions and safe exercises to complete independently (seated/supine). Patient improving immediate and delayed recall after blocked practice floor recovery from previous session. May benefit from further education on general home safety. PT observing improved general safety with patient using SPC and he may benefit from using this device AAT. Continue POC.   OBJECTIVE IMPAIRMENTS Abnormal gait, decreased balance, increased edema, impaired perceived functional ability, and pain.   ACTIVITY LIMITATIONS lifting, bending, squatting, stairs, and transfers  PARTICIPATION LIMITATIONS: cleaning, driving, community activity, and yard work  PERSONAL FACTORS 1-2 comorbidities:     diabetes, hypercholesterolemia, high blood pressure, and possible Parkinsonism are also affecting patient's functional outcome.   REHAB POTENTIAL: Fair due to impaired cognition and lack of family support  CLINICAL DECISION MAKING: Stable/uncomplicated  EVALUATION COMPLEXITY: Low   GOALS: Goals reviewed with patient? Yes   NEW SHORT TERM GOALS FOR UPDATED POC:   Target date: 09/04/2022  Pt will perform initial HEP with min A from wife for improved strength, balance, transfers and gait.  Baseline: not yet established  Goal status: NOT MET  2. Patient will improve MiniBESTest score to >/= 18/28 to demonstrate decreased risk for falling Baseline: 14/28; (10/24) 16/28 Goal status: NOT MET  3.  Pt will improve 5x STS to </= 9 sec to demo improved functional LE strength and balance  Baseline: 12.8; 15.69s without UE support  Goal status: NOT MET  4.  Pt and wife will verbalize and demonstrate fall prevention techniques in the home and community setting for reduced fall risk and maintained independence  Baseline: pt unable to immediately recall steps Goal status: NOT MET  5.  Pt will ambulate >300' on level and unlevel surfaces w/LRAD and min A for improved functional mobility and safety w/ambulation  Baseline: 44' with SPC + supervision Goal status: MET  NEW LONG TERM GOALS FOR UPDATED POC:  Target date: 10/02/2022  Pt will perform floor transfers w/S* for improved fall recovery and safety at home  Baseline:  Goal status: INITIAL  2.  Pt will ascend/descend 12 steps using single rail either sideways or step-to pattern w/S* for improved household mobility and safety  Baseline:  Goal status: INITIAL  3.  Patient will improve MiniBESTest score to >/=22/28 to demonstrate decreased risk for falling Baseline: 18/28 Goal status: INITIAL  4.  Pt will perform final HEP w/min A from wife for improved strength, balance, transfers and gait.  Baseline:  Goal status: INITIAL  5.   Pt will ambulate >500' on level and unlevel surfaces w/LRAD and S* for improved functional mobility and independence  Baseline:  Goal status: INITIAL    PLAN: PT FREQUENCY: 2x/week (recert)  PT DURATION: 8 weeks (recert)  PLANNED INTERVENTIONS: Therapeutic exercises, Therapeutic activity, Neuromuscular re-education, Balance training, Gait training, Patient/Family education, Self Care, Joint mobilization, Joint manipulation, Stair training, DME instructions, Aquatic Therapy, Dry Needling, Electrical stimulation, Cryotherapy, Moist heat, Compression bandaging, Taping, Ultrasound, Biofeedback, Ionotophoresis 4mg /ml Dexamethasone, Manual therapy, and Re-evaluation  PLAN FOR NEXT SESSION: refer to Dr. Carles Collet.  balance (especially with eyes closed), unlevel surfaces, education on PD/PSP, LE strengthening in pain-free range, SciFit, may want to review floor transfer   Debbora Dus, PT, DPT, CBIS  09/13/2022, 7:36 AM

## 2022-09-17 ENCOUNTER — Ambulatory Visit: Payer: PPO | Admitting: Speech Pathology

## 2022-09-17 ENCOUNTER — Ambulatory Visit: Payer: PPO | Admitting: Physical Therapy

## 2022-09-17 DIAGNOSIS — R2681 Unsteadiness on feet: Secondary | ICD-10-CM

## 2022-09-17 DIAGNOSIS — R1310 Dysphagia, unspecified: Secondary | ICD-10-CM

## 2022-09-17 DIAGNOSIS — R293 Abnormal posture: Secondary | ICD-10-CM

## 2022-09-17 DIAGNOSIS — R4189 Other symptoms and signs involving cognitive functions and awareness: Secondary | ICD-10-CM

## 2022-09-17 DIAGNOSIS — R2689 Other abnormalities of gait and mobility: Secondary | ICD-10-CM

## 2022-09-17 NOTE — Therapy (Signed)
OUTPATIENT PHYSICAL THERAPY NEURO TREATMENT   Patient Name: Joel Webb MRN: [id number] DOB:December 22, 1945, 76 y.o., male Today's Date: 09/17/2022   PT End of Session - 09/17/22 1404     Visit Number 10    Number of Visits 21    Date for PT Re-Evaluation 10/09/22    Authorization Type [employer]    Progress Note Due on Visit 10    PT Start Time 3244    PT Stop Time 1444    PT Time Calculation (min) 41 min    Equipment Utilized During Treatment Gait belt    Activity Tolerance Patient tolerated treatment well    Behavior During Therapy WFL for tasks assessed/performed;Impulsive             Past Medical History:  Diagnosis Date   Chronic kidney disease, stage 3 01/11/2021   ED (erectile dysfunction)    Essential hypertension 06/05/2017   Frequent falls    Gait abnormality    Gastroesophageal reflux disease 01/11/2021   Major depressive disorder    Microalbuminuria due to type 2 diabetes mellitus 05/25/2020   Mild cognitive impairment of uncertain or unknown etiology    Mixed hyperlipidemia 02/28/2016   Multiple lacunar infarcts    Plantar fasciitis    Prostatitis    Pure hypercholesterolemia 01/11/2021   PVC's (premature ventricular contractions) 01/28/2022   Uncontrolled type 2 diabetes mellitus with hyperglycemia, with long-term current use of insulin 02/28/2016   Past Surgical History:  Procedure Laterality Date   CIRCUMCISION     COLONOSCOPY     MOUTH SURGERY  01/2020   Patient Active Problem List   Diagnosis Date Noted   Multiple lacunar infarcts    Frequent falls    Gait abnormality    Major depressive disorder 05/30/2022   Mild cognitive impairment of uncertain or unknown etiology    PVC's (premature ventricular contractions) 01/28/2022   Pure hypercholesterolemia 01/11/2021   Gastroesophageal reflux disease 01/11/2021   Chronic kidney disease, stage 3 01/11/2021   Microalbuminuria due to type 2 diabetes mellitus 05/25/2020   Essential  hypertension 06/05/2017   Uncontrolled type 2 diabetes mellitus with hyperglycemia, with long-term current use of insulin ([place]) 02/28/2016   Mixed hyperlipidemia 02/28/2016    PCP: Antony Contras, MD  REFERRING PROVIDER: Penni Bombard, MD  ONSET DATE: 07/03/2022   REFERRING DIAG: G20 (ICD-10-CM) - Parkinsonism, unspecified Parkinsonism type ([place]); R29.6 (ICD-10-CM) - Frequent falls; R26.9 (ICD-10-CM) - Gait abnormality   THERAPY DIAG:  Unsteadiness on feet  Other abnormalities of gait and mobility  Abnormal posture  Rationale for Evaluation and Treatment Rehabilitation  SUBJECTIVE:   SUBJECTIVE STATEMENT: Pt reports not having a fall but wife states he fell out of chair onto floor over the weekend. "She startled me". Unable to recall the three things to assess if he has a fall. Has not been doing his HEP.   PERTINENT HISTORY: diabetes, hypercholesterolemia, high blood pressure, Parkinsonism (suspected PSP)   PAIN:  Are you having pain? No  PRECAUTIONS: Fall  OCCUPATION: retired, does a of computer work or reads, naps  PLOF: Independent with gait and Independent with transfers, occasional SPC use  PATIENT GOALS "to get where it doesn't hurt when I go up and down the steps"   OBJECTIVE:   DIAGNOSTIC FINDINGS:  IMPRESSION: (L knee xray 06/27/22) Minimal medial compartment osteoarthritis.  IMPRESSION: (R knee xray 06/27/22) Minimal medial and patellofemoral osteoarthritis.  COGNITION: Overall cognitive status: Impaired     TODAY'S TREATMENT: Ther Act:  Reviewed  the three things pt needs to assess afer a fall before getting up, as pt unable to remember at beginning of session  Continued to provide max education on importance of slowing down to ensure he is safe with mobility and reducing his risk of falls. Wife reports pt will not respond to cues to slow down. Pt unable to teach back education provided by therapist.   Ther Ex  NuStep on gear 5 for 8 minutes  using BUE/BLEs for neural priming for reciprocal movement, dynamic cardiovascular warmup and increased amplitude of stepping. RPE of 5/10 following activity.   NMR  Side step w/lateral reach at counter for imporved [place] reach, step clearance/length and lateral weight shifting. Pt initially requiring max multimodal cues to perform (post-its as targets, visual demonstration, verbal cues) and demonstrated poor technique due to impulsiveness. After a few reps, pt able to slow down and perform well without need for cues.   Sit <>stands on airex without UE support x2 reps. No retropulsion noted.  Added yellow theraband pull-apart at top of rep for improved postural control and immediate standing balance, x10 reps. Pt frequently rushing into activity and losing balance posteriorly, requiring min-mod A to stabilize. Min cues for proper body mechanics intermittently as pt not scooting to edge of mat between each rep.   Pt able to recall the three things he needs to assess if he has a fall at end of session.   PATIENT EDUCATION:  Education details: Continue HEP with supervision, reviewed safety with floor recovery  Person educated: Patient Education method: Explanation Education comprehension: verbalized understanding and needs further education   HOME EXERCISE PROGRAM: Access Code: [email or web address] URL: [email or web address] Date: 09/05/2022 Prepared by: Estevan Ryder  Exercises - Supine Bridge  - 1 x daily - 7 x weekly - 3 sets - 10 reps - Seated Hamstring Stretch  - 1 x daily - 7 x weekly - 3 sets - 30s hold - Scapular Retraction with Resistance  - 1 x daily - 7 x weekly - 3 sets - 10 reps - Sit to Stand  - 1 x daily - 7 x weekly - 3 sets - 10 reps - Side Stepping with Resistance at Ankles  - 1 x daily - 7 x weekly - 3 sets - 10 reps   ASSESSMENT:  CLINICAL IMPRESSION: Emphasis of skilled PT session on endurance, increased step clearance and immediate standing balance. Pt unable to  recall the 3 things he needs to assess if he has a fall at beginning of session but was able to recall at end of session. Pt continues to be limited by his impulsive behavior, putting himself at high risk for falls. Pt required max cues throughout session to slow down and think about his movement, in which he was able to focus and demonstrate good balance techniques. Continue POC.   OBJECTIVE IMPAIRMENTS Abnormal gait, decreased balance, increased edema, impaired perceived functional ability, and pain.   ACTIVITY LIMITATIONS lifting, bending, squatting, stairs, and transfers  PARTICIPATION LIMITATIONS: cleaning, driving, community activity, and yard work  PERSONAL FACTORS 1-2 comorbidities:    diabetes, hypercholesterolemia, high blood pressure, and possible Parkinsonism are also affecting patient's functional outcome.   REHAB POTENTIAL: Fair due to impaired cognition and lack of family support  CLINICAL DECISION MAKING: Stable/uncomplicated  EVALUATION COMPLEXITY: Low   GOALS: Goals reviewed with patient? Yes   NEW SHORT TERM GOALS FOR UPDATED POC:   Target date: 09/04/2022  Pt will perform initial HEP with min A from wife  for improved strength, balance, transfers and gait.  Baseline: not yet established  Goal status: NOT MET  2. Patient will improve MiniBESTest score to >/= 18/28 to demonstrate decreased risk for falling Baseline: 14/28; (10/24) 16/28 Goal status: NOT MET  3.  Pt will improve 5x STS to </= 9 sec to demo improved functional LE strength and balance  Baseline: 12.8; 15.69s without UE support  Goal status: NOT MET  4.  Pt and wife will verbalize and demonstrate fall prevention techniques in the home and community setting for reduced fall risk and maintained independence  Baseline: pt unable to immediately recall steps Goal status: NOT MET  5.  Pt will ambulate >300' on level and unlevel surfaces w/LRAD and min A for improved functional mobility and safety  w/ambulation  Baseline: 75' with SPC + supervision Goal status: MET  NEW LONG TERM GOALS FOR UPDATED POC:  Target date: 10/02/2022  Pt will perform floor transfers w/S* for improved fall recovery and safety at home  Baseline:  Goal status: INITIAL  2.  Pt will ascend/descend 12 steps using single rail either sideways or step-to pattern w/S* for improved household mobility and safety  Baseline:  Goal status: INITIAL  3.  Patient will improve MiniBESTest score to >/=22/28 to demonstrate decreased risk for falling Baseline: 18/28 Goal status: INITIAL  4.  Pt will perform final HEP w/min A from wife for improved strength, balance, transfers and gait.  Baseline:  Goal status: INITIAL  5.  Pt will ambulate >500' on level and unlevel surfaces w/LRAD and S* for improved functional mobility and independence  Baseline:  Goal status: INITIAL    PLAN: PT FREQUENCY: 2x/week (recert)  PT DURATION: 8 weeks (recert)  PLANNED INTERVENTIONS: Therapeutic exercises, Therapeutic activity, Neuromuscular re-education, Balance training, Gait training, Patient/Family education, Self Care, Joint mobilization, Joint manipulation, Stair training, DME instructions, Aquatic Therapy, Dry Needling, Electrical stimulation, Cryotherapy, Moist heat, Compression bandaging, Taping, Ultrasound, Biofeedback, Ionotophoresis 10m/ml Dexamethasone, Manual therapy, and Re-evaluation  PLAN FOR NEXT SESSION: refer to Dr. TCarles Collet  balance (especially with eyes closed), unlevel surfaces, education on PD/PSP, LE strengthening in pain-free range, SciFit, may want to review floor transfer   JCruzita LedererPlaster, PT, DPT 09/17/2022, 2:47 PM

## 2022-09-17 NOTE — Therapy (Signed)
OUTPATIENT SPEECH LANGUAGE PATHOLOGY TREATMENT NOTE   Patient Name: Joel Webb MRN: [id number] DOB:08/11/1946, 76 y.o., male Today's Date: 09/17/2022  PCP: Antony Contras REFERRING PROVIDER: Andrey Spearman     Past Medical History:  Diagnosis Date   Chronic kidney disease, stage 3 01/11/2021   ED (erectile dysfunction)    Essential hypertension 06/05/2017   Frequent falls    Gait abnormality    Gastroesophageal reflux disease 01/11/2021   Major depressive disorder    Microalbuminuria due to type 2 diabetes mellitus 05/25/2020   Mild cognitive impairment of uncertain or unknown etiology    Mixed hyperlipidemia 02/28/2016   Multiple lacunar infarcts    Plantar fasciitis    Prostatitis    Pure hypercholesterolemia 01/11/2021   PVC's (premature ventricular contractions) 01/28/2022   Uncontrolled type 2 diabetes mellitus with hyperglycemia, with long-term current use of insulin 02/28/2016   Past Surgical History:  Procedure Laterality Date   CIRCUMCISION     COLONOSCOPY     MOUTH SURGERY  01/2020   Patient Active Problem List   Diagnosis Date Noted   Multiple lacunar infarcts    Frequent falls    Gait abnormality    Major depressive disorder 05/30/2022   Mild cognitive impairment of uncertain or unknown etiology    PVC's (premature ventricular contractions) 01/28/2022   Pure hypercholesterolemia 01/11/2021   Gastroesophageal reflux disease 01/11/2021   Chronic kidney disease, stage 3 01/11/2021   Microalbuminuria due to type 2 diabetes mellitus 05/25/2020   Essential hypertension 06/05/2017   Uncontrolled type 2 diabetes mellitus with hyperglycemia, with long-term current use of insulin ([facility]) 02/28/2016   Mixed hyperlipidemia 02/28/2016    ONSET DATE: 09/03/22  REFERRING DIAG: Progressive Supranuclear Palsy  THERAPY DIAG:  No diagnosis found.  Rationale for Evaluation and Treatment Rehabilitation  SUBJECTIVE:   SUBJECTIVE STATEMENT: "He has his  barium swallow study tomorrow"    PAIN:  Are you having pain? No   OBJECTIVE:   TODAY'S TREATMENT:  09-17-22: SLP provides education on upcoming MBSS and what to expect. All pt's and spouse's questions answered to satisfaction. Additional education on considering wishes for future care and decisions re: swallow function as we know dyspahgia is a common complication associated with pt's dx of PSP. Generated memory strategies to aid in recall of evening blood pressure and sugar+insulin administration. Generated use of Alexia as external aid and coupling with walking scooter as internal aid. Pt to trial over next week and report back success.   09-10-22: Re-education provided on swallow precautions and use of intentional swallow. Recommend limiting distractions at meal times to aid in focus. Education on PSP impact on swallow, to include saliva management. Pt reports to increased drooling and difficulty with "nasal drip." SLP suspects some pharyngeal residue sensation may be 2/2 reduced volitional swallow for saliva management. Recommend strategy of either chewing gum or intentional swallow prior to speaking. Generated strategy to A in recall of turning off burners (red tape in off position) and checking blood pressure when waking (making part of routine) as solutions to pt reported issues currently affecting successful participation. Will plan to trial errorless learning to aid in visual scanning of stove dials next session.    09-03-22: SLP administered cognitive linguistic assessment CLQT and BSE. Pt and pt's wife report no acute changes in speech and voice. Pt reports difficulty with cognitive skills, word finding and swallow function. SLP recommended MBS to further assess swallow function and continued current diet regular textures and thin liquids with precautions  effortful/intental swallow and to separate solids/liquids. SLP and pt created plan and all questions answered to  satisfaction.   PATIENT EDUCATION: Education details: swallow strategies and need for MBS to further assess swallow function.  Person educated: Patient and Spouse Education method: Customer service manager Education comprehension: verbalized understanding   HOME EXERCISE PROGRAM: HEP will be created next session due to time restraints.    GOALS: Goals reviewed with patient? Yes  SHORT TERM GOALS: Target date: 10/01/2022    Pt will demonstrate mildly complex problem solving/executive function skills with supervision A verbal cues for 80% accuracy.  Baseline: Goal status: IN PROGRESS  2.  Pt will demonstrate use of internal/external recall strategies in functional tasks with min A verbal cues for 80% accuracy. Baseline:  Goal status: IN PROGRESS  3.  Pt will demonstrate sustained and selective attention in functional task for 10-15 minute intervals with supervision A verbal cues for redirection.  Baseline:  Goal status: IN PROGRESS  4.  Pt will self-monitor and self-correct functional errors with min A verbal cues for 80% accuracy. Baseline:  Goal status: IN PROGRESS  5.  Pt will demonstrate word finding skills in structured and unstructured tasks with min A verbal cues for 80% accuracy.  Baseline:  Goal status: IN PROGRESS  6.  Pt will participate in instrumental swallow assessment for further assessment of swallow function.  Baseline:  Goal status: IN PROGRESS  LONG TERM GOALS: Target date: 10/29/2022    Pt will demonstrate mildly complex to complex problem solving skills with supervision A verbal cues for 90% accuracy.  Baseline:  Goal status: IN PROGRESS  2.  Pt will demonstrate use of internal/external recall strategies in functional tasks with supervision A verbal cues for 80% accuracy. Baseline:  Goal status: IN PROGRESS  3.   Pt will demonstrate sustained and selective attention in functional task for 15-20 minute intervals with supervision A verbal  cues for redirection. Baseline:  Goal status: IN PROGRESS  4.    Pt will self-monitor and self-correct functional errors with supervision A verbal cues for 80% accuracy. Baseline:  Goal status: IN PROGRESS  5.  Pt will demonstrate word finding skills in structured and unstructured tasks with supervision A verbal cues for 80% accuracy.  Baseline:  Goal status: IN PROGRESS  6.  Pt will consume least restrictive diet with supervision A verbal cues for strategies and exercises to improvement swallow function.  Baseline:  Goal status: IN PROGRESS  ASSESSMENT:  CLINICAL IMPRESSION: Patient is a 76 y.o. male who was seen today for cognitive linguistics and swallow evaluation. Pt presents with mild cognitive linguistic deficits supports by score on CLQT indicating mild deficits in executive function and visual spatial awareness and WFL (but at the low end) in attention, memory and language. Pt supports changes since PSP diagnosis in the above mention areas. Pt expressed difficulty swallowing, often coughing on thin liquids and solids for the past year with no h/o of PNA. Oral motor exam indicated repetitive nonfunctional lingual movements and reduced lingual coordination, with remaining areas being [facility]. Pt consumed thin liquids via straw ([facility] 3oz water test) and regular textures with only mild reduced bolus formation (on regular textures) and possible delay in swallow initiation and x1 delayed throat clear on thin liquids (pt's vocal quality appeared dry after occurrence.) Pt demonstrated reduced bolus cohesion on mixed consistencies, possible delay in swallow initiation and immediate coughing episode. SLP recommends continuing regular textures and thin liquid diet with strict instructions to separate consistencies along  with a slow rate and small bites/sips. Pt and pt's wife agreed and supported no acute changes in speech function. Pt would benefit from skilled ST services in order to maximize  functional skills and reduce burden of care.   OBJECTIVE IMPAIRMENTS  Objective impairments include attention, memory, awareness, executive functioning, expressive language, and dysphagia. These impairments are limiting patient from managing appointments, managing finances, household responsibilities, and safety when swallowing.Factors affecting potential to achieve goals and functional outcome are ability to learn/carryover information, co-morbidities, and severity of impairments.. Patient will benefit from skilled SLP services to address above impairments and improve overall function.  REHAB POTENTIAL: Good  PLAN: SLP FREQUENCY: 2x/week  SLP DURATION: 8 weeks  PLANNED INTERVENTIONS: Aspiration precaution training, Pharyngeal strengthening exercises, Diet toleration management , Trials of upgraded texture/liquids, Cueing hierachy, Cognitive reorganization, Internal/external aids, SLP instruction and feedback, Compensatory strategies, and Patient/family education    Su Monks, [place] 09/17/2022, 12:24 PM

## 2022-09-18 ENCOUNTER — Ambulatory Visit (HOSPITAL_COMMUNITY)
Admission: RE | Admit: 2022-09-18 | Discharge: 2022-09-18 | Disposition: A | Discharge status: Home / Self Care | Payer: PPO | Source: Ambulatory Visit | Attending: Family Medicine | Admitting: Family Medicine

## 2022-09-18 DIAGNOSIS — N183 Chronic kidney disease, stage 3 unspecified: Secondary | ICD-10-CM | POA: Diagnosis not present

## 2022-09-18 DIAGNOSIS — R4189 Other symptoms and signs involving cognitive functions and awareness: Secondary | ICD-10-CM

## 2022-09-18 DIAGNOSIS — R1310 Dysphagia, unspecified: Secondary | ICD-10-CM

## 2022-09-18 DIAGNOSIS — G20A1 Parkinson's disease without dyskinesia, without mention of fluctuations: Secondary | ICD-10-CM

## 2022-09-18 DIAGNOSIS — E1122 Type 2 diabetes mellitus with diabetic chronic kidney disease: Secondary | ICD-10-CM | POA: Insufficient documentation

## 2022-09-18 DIAGNOSIS — R1312 Dysphagia, oropharyngeal phase: Secondary | ICD-10-CM | POA: Insufficient documentation

## 2022-09-19 ENCOUNTER — Ambulatory Visit: Payer: PPO | Admitting: Occupational Therapy

## 2022-09-19 ENCOUNTER — Ambulatory Visit: Payer: PPO | Attending: Diagnostic Neuroimaging

## 2022-09-19 DIAGNOSIS — R4189 Other symptoms and signs involving cognitive functions and awareness: Secondary | ICD-10-CM | POA: Diagnosis not present

## 2022-09-19 DIAGNOSIS — R278 Other lack of coordination: Secondary | ICD-10-CM

## 2022-09-19 DIAGNOSIS — R293 Abnormal posture: Secondary | ICD-10-CM | POA: Insufficient documentation

## 2022-09-19 DIAGNOSIS — R2681 Unsteadiness on feet: Secondary | ICD-10-CM

## 2022-09-19 DIAGNOSIS — M6281 Muscle weakness (generalized): Secondary | ICD-10-CM | POA: Diagnosis not present

## 2022-09-19 DIAGNOSIS — R2689 Other abnormalities of gait and mobility: Secondary | ICD-10-CM

## 2022-09-19 DIAGNOSIS — R29818 Other symptoms and signs involving the nervous system: Secondary | ICD-10-CM | POA: Insufficient documentation

## 2022-09-19 DIAGNOSIS — R1310 Dysphagia, unspecified: Secondary | ICD-10-CM | POA: Diagnosis not present

## 2022-09-19 NOTE — Therapy (Signed)
OUTPATIENT PHYSICAL THERAPY NEURO TREATMENT   Patient Name: Joel Webb MRN: [id number] DOB:Mar 21, 1946, 76 y.o., male Today's Date: 09/19/2022   PT End of Session - 09/19/22 1402     Visit Number 11    Number of Visits 21    Date for PT Re-Evaluation 10/09/22    Authorization Type [employer]    Progress Note Due on Visit 10    PT Start Time 1402    PT Stop Time 1443    PT Time Calculation (min) 41 min    Activity Tolerance Patient tolerated treatment well    Behavior During Therapy [facility] for tasks assessed/performed;Impulsive             Past Medical History:  Diagnosis Date   Chronic kidney disease, stage 3 01/11/2021   ED (erectile dysfunction)    Essential hypertension 06/05/2017   Frequent falls    Gait abnormality    Gastroesophageal reflux disease 01/11/2021   Major depressive disorder    Microalbuminuria due to type 2 diabetes mellitus 05/25/2020   Mild cognitive impairment of uncertain or unknown etiology    Mixed hyperlipidemia 02/28/2016   Multiple lacunar infarcts    Plantar fasciitis    Prostatitis    Pure hypercholesterolemia 01/11/2021   PVC's (premature ventricular contractions) 01/28/2022   Uncontrolled type 2 diabetes mellitus with hyperglycemia, with long-term current use of insulin 02/28/2016   Past Surgical History:  Procedure Laterality Date   CIRCUMCISION     COLONOSCOPY     MOUTH SURGERY  01/2020   Patient Active Problem List   Diagnosis Date Noted   Multiple lacunar infarcts    Frequent falls    Gait abnormality    Major depressive disorder 05/30/2022   Mild cognitive impairment of uncertain or unknown etiology    PVC's (premature ventricular contractions) 01/28/2022   Pure hypercholesterolemia 01/11/2021   Gastroesophageal reflux disease 01/11/2021   Chronic kidney disease, stage 3 01/11/2021   Microalbuminuria due to type 2 diabetes mellitus 05/25/2020   Essential hypertension 06/05/2017   Uncontrolled type 2  diabetes mellitus with hyperglycemia, with long-term current use of insulin ([place]) 02/28/2016   Mixed hyperlipidemia 02/28/2016    PCP: Antony Contras, MD  REFERRING PROVIDER: Penni Bombard, MD  ONSET DATE: 07/03/2022   REFERRING DIAG: G20 (ICD-10-CM) - Parkinsonism, unspecified Parkinsonism type ([place]); R29.6 (ICD-10-CM) - Frequent falls; R26.9 (ICD-10-CM) - Gait abnormality   THERAPY DIAG:  Unsteadiness on feet  Other abnormalities of gait and mobility  Abnormal posture  Muscle weakness (generalized)  Rationale for Evaluation and Treatment Rehabilitation  SUBJECTIVE:   SUBJECTIVE STATEMENT: Patient reports doing well- denies falls since last visit. Reviewed need for wearing LifeAlert, which patient does not have on and has not worn for some time. PT asking if patient would complete task that was provided in written reminder and patient said "no" making conscious decision to not follow safety recommendations. Has not been doing HEP.   PERTINENT HISTORY: diabetes, hypercholesterolemia, high blood pressure, Parkinsonism (suspected PSP)   PAIN:  Are you having pain? No  PRECAUTIONS: Fall  OCCUPATION: retired, does a of computer work or reads, naps  PLOF: Independent with gait and Independent with transfers, occasional SPC use  PATIENT GOALS "to get where it doesn't hurt when I go up and down the steps"   OBJECTIVE:   DIAGNOSTIC FINDINGS:  IMPRESSION: (L knee xray 06/27/22) Minimal medial compartment osteoarthritis.  IMPRESSION: (R knee xray 06/27/22) Minimal medial and patellofemoral osteoarthritis.  COGNITION: Overall cognitive status:  Impaired     TODAY'S TREATMENT: Ther Act:  Re-iterated education previous provided (fall precautions, wearing LifeAlert, car transfers, safe use of stair lift)    NMR  [place] level 3 x10 mins B UE/LE for large amplitude reciprocal coordination  Rock and reach with MinA for balance and TotalA for coordination  (increased difficulty dual tasking noted) Sit <> stand + reach with 3# ball-> standing on Airex (increased posterior LOB with decreased righting responses noted)  Lateral step and reach with grossly TotalA to coordinate  PATIENT EDUCATION:  Education details: Continue HEP, safety precautions (see above)  Person educated: Patient Education method: Explanation Education comprehension: verbalized understanding and needs further education   HOME EXERCISE PROGRAM: Access Code: [email or web address] URL: [email or web address] Date: 09/05/2022 Prepared by: Estevan Ryder  Exercises - Supine Bridge  - 1 x daily - 7 x weekly - 3 sets - 10 reps - Seated Hamstring Stretch  - 1 x daily - 7 x weekly - 3 sets - 30s hold - Scapular Retraction with Resistance  - 1 x daily - 7 x weekly - 3 sets - 10 reps - Sit to Stand  - 1 x daily - 7 x weekly - 3 sets - 10 reps - Side Stepping with Resistance at Ankles  - 1 x daily - 7 x weekly - 3 sets - 10 reps   ASSESSMENT:  CLINICAL IMPRESSION: Patient seen for skilled PT session with emphasis on gross NMR and large amplitude reciprocal coordination. Patient remains impulsive and states that he decides to not follow certain safety recommendations, such as wearing a lifealert, car transfer technique, keeping his L hand out his pocket when walking, fall precautions/recovery. Patient continues to have difficulty with simple dual tasking and positional awareness as it pertains to LOB. Continue POC.   OBJECTIVE IMPAIRMENTS Abnormal gait, decreased balance, increased edema, impaired perceived functional ability, and pain.   ACTIVITY LIMITATIONS lifting, bending, squatting, stairs, and transfers  PARTICIPATION LIMITATIONS: cleaning, driving, community activity, and yard work  PERSONAL FACTORS 1-2 comorbidities:    diabetes, hypercholesterolemia, high blood pressure, and possible Parkinsonism are also affecting patient's functional outcome.   REHAB POTENTIAL:  Fair due to impaired cognition and lack of family support  CLINICAL DECISION MAKING: Stable/uncomplicated  EVALUATION COMPLEXITY: Low   GOALS: Goals reviewed with patient? Yes   NEW SHORT TERM GOALS FOR UPDATED POC:   Target date: 09/04/2022  Pt will perform initial HEP with min A from wife for improved strength, balance, transfers and gait.  Baseline: not yet established  Goal status: NOT MET  2. Patient will improve MiniBESTest score to >/= 18/28 to demonstrate decreased risk for falling Baseline: 14/28; (10/24) 16/28 Goal status: NOT MET  3.  Pt will improve 5x STS to </= 9 sec to demo improved functional LE strength and balance  Baseline: 12.8; 15.69s without UE support  Goal status: NOT MET  4.  Pt and wife will verbalize and demonstrate fall prevention techniques in the home and community setting for reduced fall risk and maintained independence  Baseline: pt unable to immediately recall steps Goal status: NOT MET  5.  Pt will ambulate >300' on level and unlevel surfaces w/LRAD and min A for improved functional mobility and safety w/ambulation  Baseline: 28' with SPC + supervision Goal status: MET  NEW LONG TERM GOALS FOR UPDATED POC:  Target date: 10/02/2022  Pt will perform floor transfers w/S* for improved fall recovery and safety at home  Baseline:  Goal status: INITIAL  2.  Pt will ascend/descend 12 steps using single rail either sideways or step-to pattern w/S* for improved household mobility and safety  Baseline:  Goal status: INITIAL  3.  Patient will improve MiniBESTest score to >/=22/28 to demonstrate decreased risk for falling Baseline: 18/28 Goal status: INITIAL  4.  Pt will perform final HEP w/min A from wife for improved strength, balance, transfers and gait.  Baseline:  Goal status: INITIAL  5.  Pt will ambulate >500' on level and unlevel surfaces w/LRAD and S* for improved functional mobility and independence  Baseline:  Goal status:  INITIAL    PLAN: PT FREQUENCY: 2x/week (recert)  PT DURATION: 8 weeks (recert)  PLANNED INTERVENTIONS: Therapeutic exercises, Therapeutic activity, Neuromuscular re-education, Balance training, Gait training, Patient/Family education, Self Care, Joint mobilization, Joint manipulation, Stair training, DME instructions, Aquatic Therapy, Dry Needling, Electrical stimulation, Cryotherapy, Moist heat, Compression bandaging, Taping, Ultrasound, Biofeedback, Ionotophoresis 46m/ml Dexamethasone, Manual therapy, and Re-evaluation  PLAN FOR NEXT SESSION: refer to Dr. TCarles Collet  balance (especially with eyes closed), unlevel surfaces, education on PD/PSP, LE strengthening in pain-free range, SciFit, may want to review floor transfer   JDebbora Dus PT, DPT JDebbora Dus PT, DPT, CBIS  09/19/2022, 2:49 PM

## 2022-09-19 NOTE — Therapy (Signed)
OUTPATIENT OCCUPATIONAL THERAPY PARKINSON'S EVALUATION  Patient Name: RIYAN HAILE MRN: [id number] DOB:September 19, 1946, 76 y.o., male Today's Date: 09/20/2022  PCP: Dr. Moreen Fowler REFERRING PROVIDER: Dr. Leta Baptist   OT End of Session - 09/20/22 1028     Visit Number 1    Number of Visits 25    Date for OT Re-Evaluation 12/13/22    Authorization Type [employer]    OT Start Time 1448    OT Stop Time 1528    OT Time Calculation (min) 40 min    Behavior During Therapy [facility] for tasks assessed/performed;Impulsive             Past Medical History:  Diagnosis Date   Chronic kidney disease, stage 3 01/11/2021   ED (erectile dysfunction)    Essential hypertension 06/05/2017   Frequent falls    Gait abnormality    Gastroesophageal reflux disease 01/11/2021   Major depressive disorder    Microalbuminuria due to type 2 diabetes mellitus 05/25/2020   Mild cognitive impairment of uncertain or unknown etiology    Mixed hyperlipidemia 02/28/2016   Multiple lacunar infarcts    Plantar fasciitis    Prostatitis    Pure hypercholesterolemia 01/11/2021   PVC's (premature ventricular contractions) 01/28/2022   Uncontrolled type 2 diabetes mellitus with hyperglycemia, with long-term current use of insulin 02/28/2016   Past Surgical History:  Procedure Laterality Date   CIRCUMCISION     COLONOSCOPY     MOUTH SURGERY  01/2020   Patient Active Problem List   Diagnosis Date Noted   Multiple lacunar infarcts    Frequent falls    Gait abnormality    Major depressive disorder 05/30/2022   Mild cognitive impairment of uncertain or unknown etiology    PVC's (premature ventricular contractions) 01/28/2022   Pure hypercholesterolemia 01/11/2021   Gastroesophageal reflux disease 01/11/2021   Chronic kidney disease, stage 3 01/11/2021   Microalbuminuria due to type 2 diabetes mellitus 05/25/2020   Essential hypertension 06/05/2017   Uncontrolled type 2 diabetes mellitus with  hyperglycemia, with long-term current use of insulin ([place]) 02/28/2016   Mixed hyperlipidemia 02/28/2016    ONSET DATE: referral 08/15/22  REFERRING DIAG: Parkinsonism  THERAPY DIAG:  Other lack of coordination  Abnormal posture  Muscle weakness (generalized)  Other abnormalities of gait and mobility  Cognitive deficits  Unsteadiness on feet  Other symptoms and signs involving the nervous system  Rationale for Evaluation and Treatment: Rehabilitation  SUBJECTIVE:   SUBJECTIVE STATEMENT: Pt wants to play golf Pt accompanied by: significant other  PERTINENT HISTORY: diabetes, hypercholesterolemia, high blood pressure, Parkinsonism possible PSP, hx of CVA , visual deficits L side   02/23/22 MRI brain 1. No reversible cause for symptoms. 2. Stable cortical and cerebellar volume loss. Somewhat prominent midbrain thinning, are there symptoms of supranuclear palsy? 3. Interval lacunar infarct in the right putamen, but overall mild chronic small vessel ischemia for age.   03/04/22 PET brain  - No significant loss of cortical metabolism to suggest Alzheimer's type dementia. No evidence of frontotemporal dementia   07/25/22 DATscan Significant bilateral decreased radiotracer activity within LEFT and RIGHT striatum. Deficit greater on the RIGHT. Findings suggestive of Parkinson's syndrome pathologies.    PRECAUTIONS: Fall   WEIGHT BEARING RESTRICTIONS: No  PAIN:  Are you having pain? No  FALLS: Has patient fallen in last 6 months? Yes. Number of falls multiple  LIVING ENVIRONMENT: Lives with: lives with their spouse Lives in: House/apartment Stairs: yes, has stair lift Has following equipment at home: stair  lift , walkin shower  PLOF: Independent  PATIENT GOALS: stop losing balance, play golf  OBJECTIVE:   HAND DOMINANCE: Right  ADLs: Overall ADLs: Pt is performing basic ADLs mod I however he has recent hx of falls Transfers/ambulation related to  ADLs: Eating: difficulty with swallowing  Grooming: mod I UB Dressing: mod I LB Dressing: mod I Toileting: mod I Bathing: mod I Tub Shower transfers: mod I Equipment: Grab bars and Walk in shower  IADLs: Shopping: grocery shops  with wife dropping him off Light housekeeping: washes dishes, does own laundry  Meal Prep: cooks occasionally  Community mobility: mod I with rollator Medication management: needs assist due to cognition, however pt is filling up pillbox Financial management: wife handles Handwriting:  NT  MOBILITY STATUS: Hx of falls  POSTURE COMMENTS:  rounded shoulders and forward head   FUNCTIONAL OUTCOME MEASURES: Fastening/unfastening 3 buttons: 52.41 Physical performance test: PPT#2 (simulated eating) 10.82 & PPT#4 (donning/doffing jacket): 30 secs  COORDINATION: 9 Hole Peg test: Right: 29.90 sec; Left: 49.28 sec Box and Blocks:  Right 40blocks, Left 29 blocks   UE ROM:  WFL  UE MMT:   WFL  SENSATION: Light touch: WFL  Vision: Pt is unable to track inferiorly which is consistent with PSP, difficulty tracking superiorly  COGNITION: Overall cognitive status: Impaired, short term memory deficits, slowed processing  OBSERVATIONS: Bradykinesia   TODAY'S TREATMENT:                                                                                                                              DATE: n/a eval    PATIENT EDUCATION: Education details: role of OT Person educated: Patient and Spouse Education method: Explanation Education comprehension: verbalized understanding  HOME EXERCISE PROGRAM: N/A  GOALS: Potential goals discussed with pt/ wife  SHORT TERM GOALS: Target date:10/18/22       I with HEP. Baseline: Goal status: INITIAL  2.  Pt will verbalize understanding of adapted strategies to maximize safety and I with ADLs/ IADLs  including strategies to reduce falls.. Baseline:  Goal status: INITIAL  3.  Pt will demonstrate  improved LUE functional use as evidenced by increasing box/ blocks score by 3 blocks Baseline:  Goal status: INITIAL  4.  Pt will demonstrate understanding of memory compensations and ways to keep thinking skills sharp Baseline:  Goal status: INITIAL  5. Pt will write a his name with 100% legibility and minimal decrease in letter size Baseline:  Goal status: INITIAL  6. Pt/ wife will verbalize understanding of compensatory strategies for visual deficits.  Goal status: inital  LONG TERM GOALS: Target date:12/13/22   Pt will verbalize understanding of ways to prevent future  complications and PD community resources. Baseline:  Goal status: INITIAL  2.  Pt will demonstrate improved fine motor coordination for ADLs as evidenced by decreasing 9 hole peg test score for LUE by 3 secs Baseline:  Goal status: INITIAL  3.  Pt  will demonstrate increased ease with dressing as eveidenced by decreasing PPT#4(don/ doff jacket) to 25 secs or less Baseline:  Goal status: INITIAL  4.  Pt will demonstrate improved ease with fastening buttons as evidenced by decreasing 3 button/ unbutton time to :48 secs or less  Baseline:  Goal status: INITIAL   ASSESSMENT:  CLINICAL IMPRESSION: Patient is a 76 y.o. male who was seen today for occupational therapy evaluation for Parkinsonism, possible PSP. NOI:[id number], hypercholesterolemia, high blood pressure, Parkinsonism possible PSP, hx of CVA , visual deficits L side   PERFORMANCE DEFICITS: in functional skills including ADLs, IADLs, coordination, dexterity, flexibility, Fine motor control, Gross motor control, mobility, balance, decreased knowledge of precautions, decreased knowledge of use of DME, vision, and UE functional use, cognitive skills including attention, energy/drive, learn, memory, perception, problem solving, safety awareness, thought, and understand, and psychosocial skills including coping strategies, environmental adaptation, habits,  interpersonal interactions, and routines and behaviors.   IMPAIRMENTS: are limiting patient from ADLs, IADLs, play, leisure, and social participation.   COMORBIDITIES:  may have co-morbidities  that affects occupational performance. Patient will benefit from skilled OT to address above impairments and improve overall function.  MODIFICATION OR ASSISTANCE TO COMPLETE EVALUATION: Min-Moderate modification of tasks or assist with assess necessary to complete an evaluation.  OT OCCUPATIONAL PROFILE AND HISTORY: Detailed assessment: Review of records and additional review of physical, cognitive, psychosocial history related to current functional performance.  CLINICAL DECISION MAKING: Moderate - several treatment options, min-mod task modification necessary  REHAB POTENTIAL: Good  EVALUATION COMPLEXITY: Moderate    PLAN:  OT FREQUENCY: 2x/week  OT DURATION: 12 weeks  PLANNED INTERVENTIONS: self care/ADL training, therapeutic exercise, therapeutic activity, neuromuscular re-education, manual therapy, passive range of motion, balance training, functional mobility training, aquatic therapy, splinting, ultrasound, paraffin, fluidotherapy, moist heat, cryotherapy, contrast bath, patient/family education, cognitive remediation/compensation, visual/perceptual remediation/compensation, energy conservation, coping strategies training, DME and/or AE instructions, and Re-evaluation  RECOMMENDED OTHER SERVICES: n/a  CONSULTED AND AGREED WITH PLAN OF CARE: Patient  PLAN FOR NEXT SESSION: ADL strategies, coordination HEP   Zyasia Halbleib, OT 09/20/2022, 10:31 AM

## 2022-09-23 DIAGNOSIS — F341 Dysthymic disorder: Secondary | ICD-10-CM | POA: Diagnosis not present

## 2022-09-23 DIAGNOSIS — H52203 Unspecified astigmatism, bilateral: Secondary | ICD-10-CM | POA: Diagnosis not present

## 2022-09-23 DIAGNOSIS — H5213 Myopia, bilateral: Secondary | ICD-10-CM | POA: Diagnosis not present

## 2022-09-23 DIAGNOSIS — E119 Type 2 diabetes mellitus without complications: Secondary | ICD-10-CM | POA: Diagnosis not present

## 2022-09-23 DIAGNOSIS — H2513 Age-related nuclear cataract, bilateral: Secondary | ICD-10-CM | POA: Diagnosis not present

## 2022-09-23 DIAGNOSIS — H04123 Dry eye syndrome of bilateral lacrimal glands: Secondary | ICD-10-CM | POA: Diagnosis not present

## 2022-09-24 ENCOUNTER — Ambulatory Visit: Payer: PPO | Admitting: Physical Therapy

## 2022-09-24 ENCOUNTER — Ambulatory Visit: Payer: PPO

## 2022-09-24 DIAGNOSIS — R4189 Other symptoms and signs involving cognitive functions and awareness: Secondary | ICD-10-CM

## 2022-09-24 DIAGNOSIS — M6281 Muscle weakness (generalized): Secondary | ICD-10-CM

## 2022-09-24 DIAGNOSIS — R2681 Unsteadiness on feet: Secondary | ICD-10-CM | POA: Diagnosis not present

## 2022-09-24 DIAGNOSIS — R2689 Other abnormalities of gait and mobility: Secondary | ICD-10-CM

## 2022-09-24 DIAGNOSIS — R293 Abnormal posture: Secondary | ICD-10-CM

## 2022-09-24 DIAGNOSIS — R1310 Dysphagia, unspecified: Secondary | ICD-10-CM

## 2022-09-24 NOTE — Therapy (Signed)
OUTPATIENT PHYSICAL THERAPY NEURO TREATMENT   Patient Name: Joel Webb MRN: [id number] DOB:[birth year], 76 y.o., male Today's Date: 09/24/2022   PT End of Session - 09/24/22 1406     Visit Number 12    Number of Visits 21    Date for PT Re-Evaluation 10/09/22    Authorization Type [employer]    Progress Note Due on Visit 10    PT Start Time 1405   Therapist running late from previous session   PT Stop Time 1443    PT Time Calculation (min) 38 min    Equipment Utilized During Treatment Gait belt    Activity Tolerance Patient tolerated treatment well    Behavior During Therapy WFL for tasks assessed/performed;Impulsive             Past Medical History:  Diagnosis Date   Chronic kidney disease, stage 3 01/11/2021   ED (erectile dysfunction)    Essential hypertension 06/05/2017   Frequent falls    Gait abnormality    Gastroesophageal reflux disease 01/11/2021   Major depressive disorder    Microalbuminuria due to type 2 diabetes mellitus 05/25/2020   Mild cognitive impairment of uncertain or unknown etiology    Mixed hyperlipidemia 02/28/2016   Multiple lacunar infarcts    Plantar fasciitis    Prostatitis    Pure hypercholesterolemia 01/11/2021   PVC's (premature ventricular contractions) 01/28/2022   Uncontrolled type 2 diabetes mellitus with hyperglycemia, with long-term current use of insulin 02/28/2016   Past Surgical History:  Procedure Laterality Date   CIRCUMCISION     COLONOSCOPY     MOUTH SURGERY  01/2020   Patient Active Problem List   Diagnosis Date Noted   Multiple lacunar infarcts    Frequent falls    Gait abnormality    Major depressive disorder 05/30/2022   Mild cognitive impairment of uncertain or unknown etiology    PVC's (premature ventricular contractions) 01/28/2022   Pure hypercholesterolemia 01/11/2021   Gastroesophageal reflux disease 01/11/2021   Chronic kidney disease, stage 3 01/11/2021   Microalbuminuria due to type  2 diabetes mellitus 05/25/2020   Essential hypertension 06/05/2017   Uncontrolled type 2 diabetes mellitus with hyperglycemia, with long-term current use of insulin ([place]) 02/28/2016   Mixed hyperlipidemia 02/28/2016    PCP: Antony Contras, MD  REFERRING PROVIDER: Penni Bombard, MD  ONSET DATE: 07/03/2022   REFERRING DIAG: G20 (ICD-10-CM) - Parkinsonism, unspecified Parkinsonism type ([place]); R29.6 (ICD-10-CM) - Frequent falls; R26.9 (ICD-10-CM) - Gait abnormality   THERAPY DIAG:  Abnormal posture  Muscle weakness (generalized)  Other abnormalities of gait and mobility  Rationale for Evaluation and Treatment Rehabilitation  SUBJECTIVE:   SUBJECTIVE STATEMENT: Patient reports doing well- wife not with him today. "She has to work in [place] tomorrow so she is leaving tonight". Pt reports his wife has not been available to help him much this week, "hopefully after she works she'll be able to help me". Pt reports one fall in which he tripped on his shoes beside his bed and fell into pillows in his closet, was able to get up by himself. Pt reports he is trying to make it a habit to wear his life alert all the time.   PERTINENT HISTORY: diabetes, hypercholesterolemia, high blood pressure, Parkinsonism (suspected PSP)   PAIN:  Are you having pain? No  PRECAUTIONS: Fall  OCCUPATION: retired, does a of computer work or reads, naps  PLOF: Independent with gait and Independent with transfers, occasional SPC use  PATIENT GOALS "to  get where it doesn't hurt when I go up and down the steps"   OBJECTIVE:   DIAGNOSTIC FINDINGS:  IMPRESSION: (L knee xray 06/27/22) Minimal medial compartment osteoarthritis.  IMPRESSION: (R knee xray 06/27/22) Minimal medial and patellofemoral osteoarthritis.  COGNITION: Overall cognitive status: Impaired     TODAY'S TREATMENT: Ther Act:  Re-iterated education previous provided (fall precautions, wearing LifeAlert, car transfers, safe use of  stair lift). Pt able to teach back proper body mechanics and the 3 things to assess if he has a fall.   NMR  SciFit multi-peaks level 7 for 8 minutes using BUE/BLEs for neural priming for reciprocal movement, dynamic cardiovascular warmup and increased amplitude of stepping. RPE of 1/10 following activity  Sit <>Stands on airex w/2kg ball throw to Karmesh, x30+ reps for improved anterior weight shifting, immediate standing balance and dual-tasking. Pt w/significant difficulty sequencing movement and required mod multimodal cues throughout to perform correct sequence. If pt moving too quickly, would lose balance posteriorly. Progressed to adding dual-task (naming food with each throw) and pt unable to maintain sequence and perseverated on single food (tomatoes) each throw despite cues for new word. No instability noted once dual task added and pt most focused on throwing ball.   PATIENT EDUCATION:  Education details: Continue HEP, safety precautions (see above), incentives to wear life alert (stickers, lollipop)  Person educated: Patient Education method: Explanation Education comprehension: verbalized understanding and needs further education   HOME EXERCISE PROGRAM: Access Code: [email or web address] URL: [email or web address] Date: 09/05/2022 Prepared by: Estevan Ryder  Exercises - Supine Bridge  - 1 x daily - 7 x weekly - 3 sets - 10 reps - Seated Hamstring Stretch  - 1 x daily - 7 x weekly - 3 sets - 30s hold - Scapular Retraction with Resistance  - 1 x daily - 7 x weekly - 3 sets - 10 reps - Sit to Stand  - 1 x daily - 7 x weekly - 3 sets - 10 reps - Side Stepping with Resistance at Ankles  - 1 x daily - 7 x weekly - 3 sets - 10 reps   ASSESSMENT:  CLINICAL IMPRESSION: Emphasis of skilled PT session on reviewing safety at home, endurance, transfers and dual-tasking. Pt continues to be impulsive and requires max education regarding safety at home (wearing life alert, fall  recovery). Pt stated he would wear life alert to sessions if incentives were in play, such as stickers/lollipops. Will provide sticker next session if pt wears life alert. Continue POC.     OBJECTIVE IMPAIRMENTS Abnormal gait, decreased balance, increased edema, impaired perceived functional ability, and pain.   ACTIVITY LIMITATIONS lifting, bending, squatting, stairs, and transfers  PARTICIPATION LIMITATIONS: cleaning, driving, community activity, and yard work  PERSONAL FACTORS 1-2 comorbidities:    diabetes, hypercholesterolemia, high blood pressure, and possible Parkinsonism are also affecting patient's functional outcome.   REHAB POTENTIAL: Fair due to impaired cognition and lack of family support  CLINICAL DECISION MAKING: Stable/uncomplicated  EVALUATION COMPLEXITY: Low   GOALS: Goals reviewed with patient? Yes   NEW SHORT TERM GOALS FOR UPDATED POC:   Target date: 09/04/2022  Pt will perform initial HEP with min A from wife for improved strength, balance, transfers and gait.  Baseline: not yet established  Goal status: NOT MET  2. Patient will improve MiniBESTest score to >/= 18/28 to demonstrate decreased risk for falling Baseline: 14/28; (10/24) 16/28 Goal status: NOT MET  3.  Pt will improve 5x STS to </= 9 sec to  demo improved functional LE strength and balance  Baseline: 12.8; 15.69s without UE support  Goal status: NOT MET  4.  Pt and wife will verbalize and demonstrate fall prevention techniques in the home and community setting for reduced fall risk and maintained independence  Baseline: pt unable to immediately recall steps Goal status: NOT MET  5.  Pt will ambulate >300' on level and unlevel surfaces w/LRAD and min A for improved functional mobility and safety w/ambulation  Baseline: 40' with SPC + supervision Goal status: MET  NEW LONG TERM GOALS FOR UPDATED POC:  Target date: 10/02/2022  Pt will perform floor transfers w/S* for improved fall  recovery and safety at home  Baseline:  Goal status: INITIAL  2.  Pt will ascend/descend 12 steps using single rail either sideways or step-to pattern w/S* for improved household mobility and safety  Baseline:  Goal status: INITIAL  3.  Patient will improve MiniBESTest score to >/=22/28 to demonstrate decreased risk for falling Baseline: 18/28 Goal status: INITIAL  4.  Pt will perform final HEP w/min A from wife for improved strength, balance, transfers and gait.  Baseline:  Goal status: INITIAL  5.  Pt will ambulate >500' on level and unlevel surfaces w/LRAD and S* for improved functional mobility and independence  Baseline:  Goal status: INITIAL    PLAN: PT FREQUENCY: 2x/week (recert)  PT DURATION: 8 weeks (recert)  PLANNED INTERVENTIONS: Therapeutic exercises, Therapeutic activity, Neuromuscular re-education, Balance training, Gait training, Patient/Family education, Self Care, Joint mobilization, Joint manipulation, Stair training, DME instructions, Aquatic Therapy, Dry Needling, Electrical stimulation, Cryotherapy, Moist heat, Compression bandaging, Taping, Ultrasound, Biofeedback, Ionotophoresis 51m/ml Dexamethasone, Manual therapy, and Re-evaluation  PLAN FOR NEXT SESSION: if he wears life alert, give him a sticker. refer to Dr. TCarles Collet  balance (especially with eyes closed), unlevel surfaces, education on PD/PSP, LE strengthening in pain-free range, SciFit, may want to review floor transfer   JBrewster PT, DPT 09/24/2022, 2:44 PM

## 2022-09-24 NOTE — Therapy (Signed)
OUTPATIENT SPEECH LANGUAGE PATHOLOGY TREATMENT NOTE   Patient Name: Joel Webb MRN: [id number] DOB:06-Mar-1946, 76 y.o., male Today's Date: 09/24/2022  PCP: Antony Contras REFERRING PROVIDER: Andrey Spearman   End of Session - 09/24/22 1410     Visit Number 4    Number of Visits 16    Date for SLP Re-Evaluation 10/29/22    SLP Start Time 35    SLP Stop Time  1400    SLP Time Calculation (min) 40 min    Activity Tolerance Patient tolerated treatment well              Past Medical History:  Diagnosis Date   Chronic kidney disease, stage 3 01/11/2021   ED (erectile dysfunction)    Essential hypertension 06/05/2017   Frequent falls    Gait abnormality    Gastroesophageal reflux disease 01/11/2021   Major depressive disorder    Microalbuminuria due to type 2 diabetes mellitus 05/25/2020   Mild cognitive impairment of uncertain or unknown etiology    Mixed hyperlipidemia 02/28/2016   Multiple lacunar infarcts    Plantar fasciitis    Prostatitis    Pure hypercholesterolemia 01/11/2021   PVC's (premature ventricular contractions) 01/28/2022   Uncontrolled type 2 diabetes mellitus with hyperglycemia, with long-term current use of insulin 02/28/2016   Past Surgical History:  Procedure Laterality Date   CIRCUMCISION     COLONOSCOPY     MOUTH SURGERY  01/2020   Patient Active Problem List   Diagnosis Date Noted   Multiple lacunar infarcts    Frequent falls    Gait abnormality    Major depressive disorder 05/30/2022   Mild cognitive impairment of uncertain or unknown etiology    PVC's (premature ventricular contractions) 01/28/2022   Pure hypercholesterolemia 01/11/2021   Gastroesophageal reflux disease 01/11/2021   Chronic kidney disease, stage 3 01/11/2021   Microalbuminuria due to type 2 diabetes mellitus 05/25/2020   Essential hypertension 06/05/2017   Uncontrolled type 2 diabetes mellitus with hyperglycemia, with long-term current use of insulin ([place])  02/28/2016   Mixed hyperlipidemia 02/28/2016    ONSET DATE: 09/03/22  REFERRING DIAG: Progressive Supranuclear Palsy  THERAPY DIAG:  Dysphagia, unspecified type  Cognitive deficits  Rationale for Evaluation and Treatment Rehabilitation  SUBJECTIVE:   SUBJECTIVE STATEMENT:  "Swallowing I going well. I put down my fork when I eat and I don't drink/eat at the same time."   PAIN:  Are you having pain? No   OBJECTIVE:   TODAY'S TREATMENT:  09-24-22: Pt supports SLP administering MBSS reviewed x-ray slides and provided education. Pt recall swallow strategies with supervision A verbal cues and reports occasional s/s aspiration when consuming thin liquids due to impulsivity. Pt reports not utilizing Alexia for external strategies due to unable to recall strategy, but reports recalling to walk scooter without need for recall strategy. SLP facilitated mildly complex problem solving and recall strategies in calendar task, pt required supervision A verbal cues. In organization reading task based on directions pt required mod A verbal cues to slow reading rate in order to process information and monitor errors. SLP educated pt on strategy of reading aloud to slow rate and allow time for processing, pt suggested reading to grandson aloud. Pt consumed thin liquids via cup, mod I for small sips and no overt s/s aspiration. SLP provided HEP of mildly complex problem solving worksheet with instructions to read aloud and monitor errors.   09-17-22: SLP provides education on upcoming MBSS and what to expect. All pt's and  spouse's questions answered to satisfaction. Additional education on considering wishes for future care and decisions re: swallow function as we know dyspahgia is a common complication associated with pt's dx of PSP. Generated memory strategies to aid in recall of evening blood pressure and sugar+insulin administration. Generated use of Alexia as external aid and coupling with walking  scooter as internal aid. Pt to trial over next week and report back success.   09-10-22: Re-education provided on swallow precautions and use of intentional swallow. Recommend limiting distractions at meal times to aid in focus. Education on PSP impact on swallow, to include saliva management. Pt reports to increased drooling and difficulty with "nasal drip." SLP suspects some pharyngeal residue sensation may be 2/2 reduced volitional swallow for saliva management. Recommend strategy of either chewing gum or intentional swallow prior to speaking. Generated strategy to A in recall of turning off burners (red tape in off position) and checking blood pressure when waking (making part of routine) as solutions to pt reported issues currently affecting successful participation. Will plan to trial errorless learning to aid in visual scanning of stove dials next session.    09-03-22: SLP administered cognitive linguistic assessment CLQT and BSE. Pt and pt's wife report no acute changes in speech and voice. Pt reports difficulty with cognitive skills, word finding and swallow function. SLP recommended MBS to further assess swallow function and continued current diet regular textures and thin liquids with precautions effortful/intental swallow and to separate solids/liquids. SLP and pt created plan and all questions answered to satisfaction.   PATIENT EDUCATION: Education details: swallow strategies and need for MBS to further assess swallow function.  Person educated: Patient and Spouse Education method: Customer service manager Education comprehension: verbalized understanding   HOME EXERCISE PROGRAM: HEP will be created next session due to time restraints.    GOALS: Goals reviewed with patient? Yes  SHORT TERM GOALS: Target date: 10/01/2022    Pt will demonstrate mildly complex problem solving/executive function skills with supervision A verbal cues for 80% accuracy.  Baseline: Goal status: IN  PROGRESS  2.  Pt will demonstrate use of internal/external recall strategies in functional tasks with min A verbal cues for 80% accuracy. Baseline:  Goal status: IN PROGRESS  3.  Pt will demonstrate sustained and selective attention in functional task for 10-15 minute intervals with supervision A verbal cues for redirection.  Baseline:  Goal status: IN PROGRESS  4.  Pt will self-monitor and self-correct functional errors with min A verbal cues for 80% accuracy. Baseline:  Goal status: IN PROGRESS  5.  Pt will demonstrate word finding skills in structured and unstructured tasks with min A verbal cues for 80% accuracy.  Baseline:  Goal status: IN PROGRESS  6.  Pt will participate in instrumental swallow assessment for further assessment of swallow function.  Baseline:  Goal status: IN PROGRESS  LONG TERM GOALS: Target date: 10/29/2022    Pt will demonstrate mildly complex to complex problem solving skills with supervision A verbal cues for 90% accuracy.  Baseline:  Goal status: IN PROGRESS  2.  Pt will demonstrate use of internal/external recall strategies in functional tasks with supervision A verbal cues for 80% accuracy. Baseline:  Goal status: IN PROGRESS  3.   Pt will demonstrate sustained and selective attention in functional task for 15-20 minute intervals with supervision A verbal cues for redirection. Baseline:  Goal status: IN PROGRESS  4.    Pt will self-monitor and self-correct functional errors with supervision A verbal cues  for 80% accuracy. Baseline:  Goal status: IN PROGRESS  5.  Pt will demonstrate word finding skills in structured and unstructured tasks with supervision A verbal cues for 80% accuracy.  Baseline:  Goal status: IN PROGRESS  6.  Pt will consume least restrictive diet with supervision A verbal cues for strategies and exercises to improvement swallow function.  Baseline:  Goal status: IN PROGRESS  ASSESSMENT:  CLINICAL IMPRESSION: Patient  is a 76 y.o. male who was seen today for cognitive linguistics and swallow evaluation. Pt presents with mild cognitive linguistic deficits supports by score on CLQT indicating mild deficits in executive function and visual spatial awareness and WFL (but at the low end) in attention, memory and language. Pt supports changes since PSP diagnosis in the above mention areas. Pt expressed difficulty swallowing, often coughing on thin liquids and solids for the past year with no h/o of PNA. Oral motor exam indicated repetitive nonfunctional lingual movements and reduced lingual coordination, with remaining areas being [facility]. Pt consumed thin liquids via straw ([place] 3oz water test) and regular textures with only mild reduced bolus formation (on regular textures) and possible delay in swallow initiation and x1 delayed throat clear on thin liquids (pt's vocal quality appeared dry after occurrence.) Pt demonstrated reduced bolus cohesion on mixed consistencies, possible delay in swallow initiation and immediate coughing episode. SLP recommends continuing regular textures and thin liquid diet with strict instructions to separate consistencies along with a slow rate and small bites/sips. Pt and pt's wife agreed and supported no acute changes in speech function. Pt would benefit from skilled ST services in order to maximize functional skills and reduce burden of care.   OBJECTIVE IMPAIRMENTS  Objective impairments include attention, memory, awareness, executive functioning, expressive language, and dysphagia. These impairments are limiting patient from managing appointments, managing finances, household responsibilities, and safety when swallowing.Factors affecting potential to achieve goals and functional outcome are ability to learn/carryover information, co-morbidities, and severity of impairments.. Patient will benefit from skilled SLP services to address above impairments and improve overall function.  REHAB POTENTIAL:  Good  PLAN: SLP FREQUENCY: 2x/week  SLP DURATION: 8 weeks  PLANNED INTERVENTIONS: Aspiration precaution training, Pharyngeal strengthening exercises, Diet toleration management , Trials of upgraded texture/liquids, Cueing hierachy, Cognitive reorganization, Internal/external aids, SLP instruction and feedback, Compensatory strategies, and Patient/family education    [place], [place] 09/24/2022, 2:15 PM

## 2022-09-26 ENCOUNTER — Ambulatory Visit: Payer: PPO

## 2022-09-26 ENCOUNTER — Ambulatory Visit: Payer: PPO | Admitting: Speech Pathology

## 2022-09-26 DIAGNOSIS — R2681 Unsteadiness on feet: Secondary | ICD-10-CM | POA: Diagnosis not present

## 2022-09-26 DIAGNOSIS — R278 Other lack of coordination: Secondary | ICD-10-CM

## 2022-09-26 DIAGNOSIS — R4189 Other symptoms and signs involving cognitive functions and awareness: Secondary | ICD-10-CM

## 2022-09-26 DIAGNOSIS — R2689 Other abnormalities of gait and mobility: Secondary | ICD-10-CM

## 2022-09-26 DIAGNOSIS — M6281 Muscle weakness (generalized): Secondary | ICD-10-CM

## 2022-09-26 DIAGNOSIS — R1310 Dysphagia, unspecified: Secondary | ICD-10-CM

## 2022-09-26 DIAGNOSIS — R293 Abnormal posture: Secondary | ICD-10-CM

## 2022-09-26 NOTE — Therapy (Signed)
OUTPATIENT PHYSICAL THERAPY NEURO TREATMENT   Patient Name: Joel Webb MRN: [id number] DOB:1946-05-21, 76 y.o., male Today's Date: 09/26/2022   PT End of Session - 09/26/22 1405     Visit Number 13    Number of Visits 21    Date for PT Re-Evaluation 10/09/22    Authorization Type [employer]    Progress Note Due on Visit 10    PT Start Time 2423    PT Stop Time 1445    PT Time Calculation (min) 42 min    Activity Tolerance Patient tolerated treatment well    Behavior During Therapy [facility] for tasks assessed/performed;Impulsive             Past Medical History:  Diagnosis Date   Chronic kidney disease, stage 3 01/11/2021   ED (erectile dysfunction)    Essential hypertension 06/05/2017   Frequent falls    Gait abnormality    Gastroesophageal reflux disease 01/11/2021   Major depressive disorder    Microalbuminuria due to type 2 diabetes mellitus 05/25/2020   Mild cognitive impairment of uncertain or unknown etiology    Mixed hyperlipidemia 02/28/2016   Multiple lacunar infarcts    Plantar fasciitis    Prostatitis    Pure hypercholesterolemia 01/11/2021   PVC's (premature ventricular contractions) 01/28/2022   Uncontrolled type 2 diabetes mellitus with hyperglycemia, with long-term current use of insulin 02/28/2016   Past Surgical History:  Procedure Laterality Date   CIRCUMCISION     COLONOSCOPY     MOUTH SURGERY  01/2020   Patient Active Problem List   Diagnosis Date Noted   Multiple lacunar infarcts    Frequent falls    Gait abnormality    Major depressive disorder 05/30/2022   Mild cognitive impairment of uncertain or unknown etiology    PVC's (premature ventricular contractions) 01/28/2022   Pure hypercholesterolemia 01/11/2021   Gastroesophageal reflux disease 01/11/2021   Chronic kidney disease, stage 3 01/11/2021   Microalbuminuria due to type 2 diabetes mellitus 05/25/2020   Essential hypertension 06/05/2017   Uncontrolled type 2  diabetes mellitus with hyperglycemia, with long-term current use of insulin ([place]) 02/28/2016   Mixed hyperlipidemia 02/28/2016    PCP: Antony Contras, MD  REFERRING PROVIDER: Penni Bombard, MD  ONSET DATE: 07/03/2022   REFERRING DIAG: G20 (ICD-10-CM) - Parkinsonism, unspecified Parkinsonism type ([place]); R29.6 (ICD-10-CM) - Frequent falls; R26.9 (ICD-10-CM) - Gait abnormality   THERAPY DIAG:  Abnormal posture  Muscle weakness (generalized)  Other abnormalities of gait and mobility  Unsteadiness on feet  Other lack of coordination  Rationale for Evaluation and Treatment Rehabilitation  SUBJECTIVE:   SUBJECTIVE STATEMENT: Patient reports doing well- wife not with him today. "She has to work in [place] tomorrow so she is leaving tonight". Pt reports his wife has not been available to help him much this week, "hopefully after she works she'll be able to help me". Pt reports one fall in which he tripped on his shoes beside his bed and fell into pillows in his closet, was able to get up by himself. Pt reports he is trying to make it a habit to wear his life alert all the time.   PERTINENT HISTORY: diabetes, hypercholesterolemia, high blood pressure, Parkinsonism (suspected PSP)   PAIN:  Are you having pain? No  PRECAUTIONS: Fall  OCCUPATION: retired, does a of computer work or reads, naps  PLOF: Independent with gait and Independent with transfers, occasional SPC use  PATIENT GOALS "to get where it doesn't hurt when I  go up and down the steps"   OBJECTIVE:   DIAGNOSTIC FINDINGS:  IMPRESSION: (L knee xray 06/27/22) Minimal medial compartment osteoarthritis.  IMPRESSION: (R knee xray 06/27/22) Minimal medial and patellofemoral osteoarthritis.  COGNITION: Overall cognitive status: Impaired     TODAY'S TREATMENT: Therex:  -scifit level 2 hills x10 mins B UE/LE  -HEP  PATIENT EDUCATION:  Education details: modified HEP to allow for patient to safely complete  independently/ minimal assist Person educated: Patient Education method: Explanation Education comprehension: verbalized understanding and needs further education   HOME EXERCISE PROGRAM: Access Code: [email or web address] URL: [email or web address] Date: 09/26/2022 Prepared by: Estevan Ryder  Exercises - Supine Bridge  - 1 x daily - 7 x weekly - 3 sets - 10 reps - Seated Hamstring Stretch  - 1 x daily - 7 x weekly - 3 sets - 30s hold - Scapular Retraction with Resistance  - 1 x daily - 7 x weekly - 3 sets - 10 reps - Sidelying Hip Abduction  - 1 x daily - 7 x weekly - 3 sets - 10 reps - Supine March  - 1 x daily - 7 x weekly - 3 sets - 10 reps - Supine Heel Slide  - 1 x daily - 7 x weekly - 3 sets - 10 reps - Supine Lower Trunk Rotation  - 1 x daily - 7 x weekly - 3 sets - 10 reps - Dead Bug  - 1 x daily - 7 x weekly - 3 sets - 10 reps   ASSESSMENT:  CLINICAL IMPRESSION: Patient seen for skilled PT session with emphasis on modifying HEP to allow for patient to complete more exercises safely and with less assist. Majority of exercises completed in supine. Patient did not wear lifealert today. Discussed with patient that he needs to implement safety tools that therapists' are providing him with. Patient verbalized understanding. Continue POC.    OBJECTIVE IMPAIRMENTS Abnormal gait, decreased balance, increased edema, impaired perceived functional ability, and pain.   ACTIVITY LIMITATIONS lifting, bending, squatting, stairs, and transfers  PARTICIPATION LIMITATIONS: cleaning, driving, community activity, and yard work  PERSONAL FACTORS 1-2 comorbidities:    diabetes, hypercholesterolemia, high blood pressure, and possible Parkinsonism are also affecting patient's functional outcome.   REHAB POTENTIAL: Fair due to impaired cognition and lack of family support  CLINICAL DECISION MAKING: Stable/uncomplicated  EVALUATION COMPLEXITY: Low   GOALS: Goals reviewed with patient?  Yes   NEW SHORT TERM GOALS FOR UPDATED POC:   Target date: 09/04/2022  Pt will perform initial HEP with min A from wife for improved strength, balance, transfers and gait.  Baseline: not yet established  Goal status: NOT MET  2. Patient will improve MiniBESTest score to >/= 18/28 to demonstrate decreased risk for falling Baseline: 14/28; (10/24) 16/28 Goal status: NOT MET  3.  Pt will improve 5x STS to </= 9 sec to demo improved functional LE strength and balance  Baseline: 12.8; 15.69s without UE support  Goal status: NOT MET  4.  Pt and wife will verbalize and demonstrate fall prevention techniques in the home and community setting for reduced fall risk and maintained independence  Baseline: pt unable to immediately recall steps Goal status: NOT MET  5.  Pt will ambulate >300' on level and unlevel surfaces w/LRAD and min A for improved functional mobility and safety w/ambulation  Baseline: 48' with SPC + supervision Goal status: MET  NEW LONG TERM GOALS FOR UPDATED POC:  Target date: 10/02/2022  Pt will perform floor  transfers w/S* for improved fall recovery and safety at home  Baseline:  Goal status: INITIAL  2.  Pt will ascend/descend 12 steps using single rail either sideways or step-to pattern w/S* for improved household mobility and safety  Baseline:  Goal status: INITIAL  3.  Patient will improve MiniBESTest score to >/=22/28 to demonstrate decreased risk for falling Baseline: 18/28 Goal status: INITIAL  4.  Pt will perform final HEP w/min A from wife for improved strength, balance, transfers and gait.  Baseline:  Goal status: INITIAL  5.  Pt will ambulate >500' on level and unlevel surfaces w/LRAD and S* for improved functional mobility and independence  Baseline:  Goal status: INITIAL    PLAN: PT FREQUENCY: 2x/week (recert)  PT DURATION: 8 weeks (recert)  PLANNED INTERVENTIONS: Therapeutic exercises, Therapeutic activity, Neuromuscular re-education,  Balance training, Gait training, Patient/Family education, Self Care, Joint mobilization, Joint manipulation, Stair training, DME instructions, Aquatic Therapy, Dry Needling, Electrical stimulation, Cryotherapy, Moist heat, Compression bandaging, Taping, Ultrasound, Biofeedback, Ionotophoresis 55m/ml Dexamethasone, Manual therapy, and Re-evaluation  PLAN FOR NEXT SESSION: if he wears life alert, give him a sticker. balance (especially with eyes closed), unlevel surfaces, education on PD/PSP, LE strengthening in pain-free range, SciFit, may want to review floor transfer   JDebbora Dus PT, DPT JDebbora Dus PT, DPT, CBIS  09/26/2022, 3:02 PM

## 2022-09-26 NOTE — Therapy (Signed)
OUTPATIENT SPEECH LANGUAGE PATHOLOGY TREATMENT NOTE   Patient Name: Joel Webb MRN: [id number] DOB:08-Feb-1946, 76 y.o., male Today's Date: 09/26/2022  PCP: Antony Contras REFERRING PROVIDER: Andrey Spearman   End of Session - 09/26/22 1530     Visit Number 5    Number of Visits 16    Date for SLP Re-Evaluation 10/29/22    Authorization Type [employer]    SLP Start Time 1531    SLP Stop Time  4098    SLP Time Calculation (min) 44 min    Activity Tolerance Patient tolerated treatment well              Past Medical History:  Diagnosis Date   Chronic kidney disease, stage 3 01/11/2021   ED (erectile dysfunction)    Essential hypertension 06/05/2017   Frequent falls    Gait abnormality    Gastroesophageal reflux disease 01/11/2021   Major depressive disorder    Microalbuminuria due to type 2 diabetes mellitus 05/25/2020   Mild cognitive impairment of uncertain or unknown etiology    Mixed hyperlipidemia 02/28/2016   Multiple lacunar infarcts    Plantar fasciitis    Prostatitis    Pure hypercholesterolemia 01/11/2021   PVC's (premature ventricular contractions) 01/28/2022   Uncontrolled type 2 diabetes mellitus with hyperglycemia, with long-term current use of insulin 02/28/2016   Past Surgical History:  Procedure Laterality Date   CIRCUMCISION     COLONOSCOPY     MOUTH SURGERY  01/2020   Patient Active Problem List   Diagnosis Date Noted   Multiple lacunar infarcts    Frequent falls    Gait abnormality    Major depressive disorder 05/30/2022   Mild cognitive impairment of uncertain or unknown etiology    PVC's (premature ventricular contractions) 01/28/2022   Pure hypercholesterolemia 01/11/2021   Gastroesophageal reflux disease 01/11/2021   Chronic kidney disease, stage 3 01/11/2021   Microalbuminuria due to type 2 diabetes mellitus 05/25/2020   Essential hypertension 06/05/2017   Uncontrolled type 2 diabetes mellitus with hyperglycemia,  with long-term current use of insulin ([place]) 02/28/2016   Mixed hyperlipidemia 02/28/2016    ONSET DATE: 09/03/22  REFERRING DIAG: Progressive Supranuclear Palsy  THERAPY DIAG:  Dysphagia, unspecified type  Cognitive deficits  Rationale for Evaluation and Treatment Rehabilitation  SUBJECTIVE:   SUBJECTIVE STATEMENT:  "Do we need 2x week for ST?"    PAIN:  Are you having pain? No   OBJECTIVE:   TODAY'S TREATMENT:  09-26-22: Spouse questioning need for 2x week ST schedule. SLP educates on role of ST in cognitive rehabilitation. Focus for pt is participation and safety in home based and preferred avocational activities and reduction of caregiver burden. Pt and spouse verbalize understanding, desire to reduce frequency to 1x week at this time. SLP cancelled appointments to accommodate request and provided updated schedule. SLP A pt in generating strategies to maximize likelihood of pt wearing life alert, see handout. Education on benefit of creating routines and habits to aid in recall of mundane but required activities. Pt tells SLP "I think that's helpful. Additional education provided on verbal cueing likely needed from spouse to aid in establishment of habits.   09-24-22: Pt supports SLP administering MBSS reviewed x-ray slides and provided education. Pt recall swallow strategies with supervision A verbal cues and reports occasional s/s aspiration when consuming thin liquids due to impulsivity. Pt reports not utilizing Alexia for external strategies due to unable to recall strategy, but reports recalling to walk scooter without need for recall  strategy. SLP facilitated mildly complex problem solving and recall strategies in calendar task, pt required supervision A verbal cues. In organization reading task based on directions pt required mod A verbal cues to slow reading rate in order to process information and monitor errors. SLP educated pt on strategy of reading aloud to slow rate and  allow time for processing, pt suggested reading to grandson aloud. Pt consumed thin liquids via cup, mod I for small sips and no overt s/s aspiration. SLP provided HEP of mildly complex problem solving worksheet with instructions to read aloud and monitor errors.   09-17-22: SLP provides education on upcoming MBSS and what to expect. All pt's and spouse's questions answered to satisfaction. Additional education on considering wishes for future care and decisions re: swallow function as we know dyspahgia is a common complication associated with pt's dx of PSP. Generated memory strategies to aid in recall of evening blood pressure and sugar+insulin administration. Generated use of Alexia as external aid and coupling with walking scooter as internal aid. Pt to trial over next week and report back success.   09-10-22: Re-education provided on swallow precautions and use of intentional swallow. Recommend limiting distractions at meal times to aid in focus. Education on PSP impact on swallow, to include saliva management. Pt reports to increased drooling and difficulty with "nasal drip." SLP suspects some pharyngeal residue sensation may be 2/2 reduced volitional swallow for saliva management. Recommend strategy of either chewing gum or intentional swallow prior to speaking. Generated strategy to A in recall of turning off burners (red tape in off position) and checking blood pressure when waking (making part of routine) as solutions to pt reported issues currently affecting successful participation. Will plan to trial errorless learning to aid in visual scanning of stove dials next session.    09-03-22: SLP administered cognitive linguistic assessment CLQT and BSE. Pt and pt's wife report no acute changes in speech and voice. Pt reports difficulty with cognitive skills, word finding and swallow function. SLP recommended MBS to further assess swallow function and continued current diet regular textures and thin  liquids with precautions effortful/intental swallow and to separate solids/liquids. SLP and pt created plan and all questions answered to satisfaction.   PATIENT EDUCATION: Education details: swallow strategies and need for MBS to further assess swallow function.  Person educated: Patient and Spouse Education method: Customer service manager Education comprehension: verbalized understanding   HOME EXERCISE PROGRAM: HEP will be created next session due to time restraints.    GOALS: Goals reviewed with patient? Yes  SHORT TERM GOALS: Target date: 10/01/2022    Pt will demonstrate mildly complex problem solving/executive function skills with supervision A verbal cues for 80% accuracy.  Baseline: 09/26/2022 Goal status: MET  2.  Pt will demonstrate use of internal/external recall strategies in functional tasks with min A verbal cues for 80% accuracy. Baseline:  Goal status: IN PROGRESS  3.  Pt will demonstrate sustained and selective attention in functional task for 10-15 minute intervals with supervision A verbal cues for redirection.  Baseline: 09/24/2022 Goal status: MET  4.  Pt will self-monitor and self-correct functional errors with min A verbal cues for 80% accuracy. Baseline:  Goal status: IN PROGRESS  5.  Pt will demonstrate word finding skills in structured and unstructured tasks with min A verbal cues for 80% accuracy.  Baseline:  Goal status: IN PROGRESS  6.  Pt will participate in instrumental swallow assessment for further assessment of swallow function.  Baseline:  Goal  status: MET  LONG TERM GOALS: Target date: 10/29/2022    Pt will demonstrate mildly complex to complex problem solving skills with supervision A verbal cues for 90% accuracy.  Baseline:  Goal status: IN PROGRESS  2.  Pt will demonstrate use of internal/external recall strategies in functional tasks with supervision A verbal cues for 80% accuracy. Baseline:  Goal status: IN  PROGRESS  3.   Pt will demonstrate sustained and selective attention in functional task for 15-20 minute intervals with supervision A verbal cues for redirection. Baseline:  Goal status: IN PROGRESS  4.    Pt will self-monitor and self-correct functional errors with supervision A verbal cues for 80% accuracy. Baseline:  Goal status: IN PROGRESS  5.  Pt will demonstrate word finding skills in structured and unstructured tasks with supervision A verbal cues for 80% accuracy.  Baseline:  Goal status: IN PROGRESS  6.  Pt will consume least restrictive diet with supervision A verbal cues for strategies and exercises to improvement swallow function.  Baseline:  Goal status: IN PROGRESS  ASSESSMENT:  CLINICAL IMPRESSION: Patient is a 76 y.o. male who was seen today for cognitive linguistics and swallow evaluation. Pt presents with mild cognitive linguistic deficits supports by score on CLQT indicating mild deficits in executive function and visual spatial awareness and WFL (but at the low end) in attention, memory and language. Pt supports changes since PSP diagnosis in the above mention areas. Pt expressed difficulty swallowing, often coughing on thin liquids and solids for the past year with no h/o of PNA. Oral motor exam indicated repetitive nonfunctional lingual movements and reduced lingual coordination, with remaining areas being [facility]. Pt consumed thin liquids via straw ([place] 3oz water test) and regular textures with only mild reduced bolus formation (on regular textures) and possible delay in swallow initiation and x1 delayed throat clear on thin liquids (pt's vocal quality appeared dry after occurrence.) Pt demonstrated reduced bolus cohesion on mixed consistencies, possible delay in swallow initiation and immediate coughing episode. SLP recommends continuing regular textures and thin liquid diet with strict instructions to separate consistencies along with a slow rate and small bites/sips. Pt  and pt's wife agreed and supported no acute changes in speech function. Pt would benefit from skilled ST services in order to maximize functional skills and reduce burden of care.   OBJECTIVE IMPAIRMENTS  Objective impairments include attention, memory, awareness, executive functioning, expressive language, and dysphagia. These impairments are limiting patient from managing appointments, managing finances, household responsibilities, and safety when swallowing.Factors affecting potential to achieve goals and functional outcome are ability to learn/carryover information, co-morbidities, and severity of impairments.. Patient will benefit from skilled SLP services to address above impairments and improve overall function.  REHAB POTENTIAL: Good  PLAN: SLP FREQUENCY: 2x/week  SLP DURATION: 8 weeks  PLANNED INTERVENTIONS: Aspiration precaution training, Pharyngeal strengthening exercises, Diet toleration management , Trials of upgraded texture/liquids, Cueing hierachy, Cognitive reorganization, Internal/external aids, SLP instruction and feedback, Compensatory strategies, and Patient/family education    Su Monks, CCC-SLP 09/26/2022, 4:19 PM

## 2022-09-26 NOTE — Patient Instructions (Signed)
Life Alert:  Move charger to bedside table  Create habit of plugging in at night and putting on when you get out of bed   Joel Webb, you may need to remind Joel Webb as he creates these new habits    SWALLOWING::  Remember you need to slow down. Think about minimizing distractions while you eat to help you remember to slow down.   When you cough when eating, take that as a cue to slow down. Stop eating until cough has ceased.

## 2022-10-01 ENCOUNTER — Ambulatory Visit: Payer: PPO | Admitting: Speech Pathology

## 2022-10-01 ENCOUNTER — Ambulatory Visit: Payer: PPO

## 2022-10-01 DIAGNOSIS — R2681 Unsteadiness on feet: Secondary | ICD-10-CM | POA: Diagnosis not present

## 2022-10-01 DIAGNOSIS — M6281 Muscle weakness (generalized): Secondary | ICD-10-CM

## 2022-10-01 DIAGNOSIS — R2689 Other abnormalities of gait and mobility: Secondary | ICD-10-CM

## 2022-10-01 DIAGNOSIS — R293 Abnormal posture: Secondary | ICD-10-CM

## 2022-10-01 DIAGNOSIS — R278 Other lack of coordination: Secondary | ICD-10-CM

## 2022-10-01 NOTE — Therapy (Signed)
OUTPATIENT PHYSICAL THERAPY NEURO TREATMENT   Patient Name: Joel Webb MRN: [id number] DOB:07-04-1946, 76 y.o., male Today's Date: 10/01/2022   PT End of Session - 10/01/22 1406     Visit Number 14    Number of Visits 21    Date for PT Re-Evaluation 10/09/22    Authorization Type [employer]    Progress Note Due on Visit 20    PT Start Time 1402    PT Stop Time 1445    PT Time Calculation (min) 43 min    Equipment Utilized During Treatment Gait belt    Activity Tolerance Patient tolerated treatment well    Behavior During Therapy WFL for tasks assessed/performed;Impulsive             Past Medical History:  Diagnosis Date   Chronic kidney disease, stage 3 01/11/2021   ED (erectile dysfunction)    Essential hypertension 06/05/2017   Frequent falls    Gait abnormality    Gastroesophageal reflux disease 01/11/2021   Major depressive disorder    Microalbuminuria due to type 2 diabetes mellitus 05/25/2020   Mild cognitive impairment of uncertain or unknown etiology    Mixed hyperlipidemia 02/28/2016   Multiple lacunar infarcts    Plantar fasciitis    Prostatitis    Pure hypercholesterolemia 01/11/2021   PVC's (premature ventricular contractions) 01/28/2022   Uncontrolled type 2 diabetes mellitus with hyperglycemia, with long-term current use of insulin 02/28/2016   Past Surgical History:  Procedure Laterality Date   CIRCUMCISION     COLONOSCOPY     MOUTH SURGERY  01/2020   Patient Active Problem List   Diagnosis Date Noted   Multiple lacunar infarcts    Frequent falls    Gait abnormality    Major depressive disorder 05/30/2022   Mild cognitive impairment of uncertain or unknown etiology    PVC's (premature ventricular contractions) 01/28/2022   Pure hypercholesterolemia 01/11/2021   Gastroesophageal reflux disease 01/11/2021   Chronic kidney disease, stage 3 01/11/2021   Microalbuminuria due to type 2 diabetes mellitus 05/25/2020   Essential  hypertension 06/05/2017   Uncontrolled type 2 diabetes mellitus with hyperglycemia, with long-term current use of insulin ([place]) 02/28/2016   Mixed hyperlipidemia 02/28/2016    PCP: Antony Contras, MD  REFERRING PROVIDER: Penni Bombard, MD  ONSET DATE: 07/03/2022   REFERRING DIAG: G20 (ICD-10-CM) - Parkinsonism, unspecified Parkinsonism type ([place]); R29.6 (ICD-10-CM) - Frequent falls; R26.9 (ICD-10-CM) - Gait abnormality   THERAPY DIAG:  Abnormal posture  Muscle weakness (generalized)  Other abnormalities of gait and mobility  Unsteadiness on feet  Other lack of coordination  Rationale for Evaluation and Treatment Rehabilitation  SUBJECTIVE:   SUBJECTIVE STATEMENT: Patient reports doing well- did wear his lifealert so he picked out a sticker today. Reports he's doing his exercises, but wife states she hasn't seen him do them.Denies falls/near falls.   PERTINENT HISTORY: diabetes, hypercholesterolemia, high blood pressure, Parkinsonism (suspected PSP)   PAIN:  Are you having pain? No  PRECAUTIONS: Fall  OCCUPATION: retired, does a of computer work or reads, naps  PLOF: Independent with gait and Independent with transfers, occasional SPC use  PATIENT GOALS "to get where it doesn't hurt when I go up and down the steps"   OBJECTIVE:   DIAGNOSTIC FINDINGS:  IMPRESSION: (L knee xray 06/27/22) Minimal medial compartment osteoarthritis.  IMPRESSION: (R knee xray 06/27/22) Minimal medial and patellofemoral osteoarthritis.  COGNITION: Overall cognitive status: Impaired     TODAY'S TREATMENT: Therex:  -scifit level 2  hills x10 mins B UE/LE    Goal assessment:   -stairs x12 supervision + R HR   -floor transfer with supervision + recall of 3/3 things to observe for if he falls   [facility] PT Assessment - 10/01/22 0001       6 minute walk test results    Aerobic Endurance Distance Walked 1259      Timed Up and Go Test   Normal TUG (seconds) 10.72    Cognitive  TUG (seconds) 10.97   completed 1 accurate calculation counting back from 100 by 3s             PATIENT EDUCATION:  Education details: PT POC, exam results  Person educated: Patient Education method: Explanation Education comprehension: verbalized understanding and needs further education   HOME EXERCISE PROGRAM: Access Code: [email or web address] URL: [email or web address] Date: 09/26/2022 Prepared by: Estevan Ryder  Exercises - Supine Bridge  - 1 x daily - 7 x weekly - 3 sets - 10 reps - Seated Hamstring Stretch  - 1 x daily - 7 x weekly - 3 sets - 30s hold - Scapular Retraction with Resistance  - 1 x daily - 7 x weekly - 3 sets - 10 reps - Sidelying Hip Abduction  - 1 x daily - 7 x weekly - 3 sets - 10 reps - Supine March  - 1 x daily - 7 x weekly - 3 sets - 10 reps - Supine Heel Slide  - 1 x daily - 7 x weekly - 3 sets - 10 reps - Supine Lower Trunk Rotation  - 1 x daily - 7 x weekly - 3 sets - 10 reps - Dead Bug  - 1 x daily - 7 x weekly - 3 sets - 10 reps   ASSESSMENT:  CLINICAL IMPRESSION: Patient seen for skilled PT session with emphasis on initiating goal check for dc. 6 Min Walk Test:  Instructed patient to ambulate as quickly and as safely as possible for 6 minutes using LRAD. Patient was allowed to take standing rest breaks without stopping the test, but if the patient required a sitting rest break the clock would be stopped and the test would be over.  Results: 1259 feet Results indicate that the patient has reduced endurance with ambulation compared to age matched norms.  Age Matched Norms: 76-69 yo M: 45 F: 46, 70-79 yo M: 82 F: 471, 58-89 yo M: 417 F: 392 MDC: 58.21 meters (190.98 feet) or 50 meters (ANPTA Core Set of Outcome Measures for Adults with Neurologic Conditions, 2018). Patient completed the Timed Up and Go test (TUG) in 10.72 seconds.  Geriatrics: need for further assessment of fall risk: ? 12 sec; Recurrent falls: > 15 sec; Vestibular  Disorders fall risk: > 15 sec; Parkinson's Disease fall risk: > 16 sec ([email or web address], 2023). Patient remains impulsive and with poor general safety awareness and adherence to general safety precautions and recommendations. Continue POC and plan to dc at next visit.      OBJECTIVE IMPAIRMENTS Abnormal gait, decreased balance, increased edema, impaired perceived functional ability, and pain.   ACTIVITY LIMITATIONS lifting, bending, squatting, stairs, and transfers  PARTICIPATION LIMITATIONS: cleaning, driving, community activity, and yard work  PERSONAL FACTORS 1-2 comorbidities:    diabetes, hypercholesterolemia, high blood pressure, and possible Parkinsonism are also affecting patient's functional outcome.   REHAB POTENTIAL: Fair due to impaired cognition and lack of family support  CLINICAL DECISION MAKING: Stable/uncomplicated  EVALUATION COMPLEXITY: Low   GOALS: Goals reviewed with patient?  Yes   NEW SHORT TERM GOALS FOR UPDATED POC:   Target date: 09/04/2022  Pt will perform initial HEP with min A from wife for improved strength, balance, transfers and gait.  Baseline: not yet established  Goal status: NOT MET  2. Patient will improve MiniBESTest score to >/= 18/28 to demonstrate decreased risk for falling Baseline: 14/28; (10/24) 16/28 Goal status: NOT MET  3.  Pt will improve 5x STS to </= 9 sec to demo improved functional LE strength and balance  Baseline: 12.8; 15.69s without UE support  Goal status: NOT MET  4.  Pt and wife will verbalize and demonstrate fall prevention techniques in the home and community setting for reduced fall risk and maintained independence  Baseline: pt unable to immediately recall steps Goal status: NOT MET  5.  Pt will ambulate >300' on level and unlevel surfaces w/LRAD and min A for improved functional mobility and safety w/ambulation  Baseline: 31' with SPC + supervision Goal status: MET  NEW LONG TERM GOALS FOR UPDATED POC:  Target  date: 10/02/2022  Pt will perform floor transfers w/S* for improved fall recovery and safety at home  Baseline: able to complete with supervision Goal status: MET  2.  Pt will ascend/descend 12 steps using single rail either sideways or step-to pattern w/S* for improved household mobility and safety  Baseline: 12 steps with supervision and R HR, reciprocal pattern Goal status: MET  3.  Patient will improve MiniBESTest score to >/=22/28 to demonstrate decreased risk for falling Baseline: 18/28 Goal status: INITIAL  4.  Pt will perform final HEP w/min A from wife for improved strength, balance, transfers and gait.  Baseline: provided Goal status: MET  5.  Pt will ambulate >500' on level and unlevel surfaces w/LRAD and S* for improved functional mobility and independence  Baseline: completed 6MWT: 1259' Goal status: MET    PLAN: PT FREQUENCY: 2x/week (recert)  PT DURATION: 8 weeks (recert)  PLANNED INTERVENTIONS: Therapeutic exercises, Therapeutic activity, Neuromuscular re-education, Balance training, Gait training, Patient/Family education, Self Care, Joint mobilization, Joint manipulation, Stair training, DME instructions, Aquatic Therapy, Dry Needling, Electrical stimulation, Cryotherapy, Moist heat, Compression bandaging, Taping, Ultrasound, Biofeedback, Ionotophoresis 22m/ml Dexamethasone, Manual therapy, and Re-evaluation  PLAN FOR NEXT SESSION: if he wears life alert, give him a sticker. Finish minibest and dc   JDebbora Dus PT, DPT JDebbora Dus PT, DPT, CBIS  10/01/2022, 2:50 PM

## 2022-10-03 ENCOUNTER — Ambulatory Visit: Payer: PPO | Admitting: Physical Therapy

## 2022-10-03 DIAGNOSIS — R2689 Other abnormalities of gait and mobility: Secondary | ICD-10-CM

## 2022-10-03 DIAGNOSIS — R2681 Unsteadiness on feet: Secondary | ICD-10-CM | POA: Diagnosis not present

## 2022-10-03 DIAGNOSIS — M6281 Muscle weakness (generalized): Secondary | ICD-10-CM

## 2022-10-03 NOTE — Therapy (Signed)
OUTPATIENT PHYSICAL THERAPY NEURO TREATMENT- DISCHARGE SUMMARY   Patient Name: Joel Webb MRN: [id number] DOB:10-07-1946, 76 y.o., male Today's Date: 10/03/2022  PHYSICAL THERAPY DISCHARGE SUMMARY  Visits from Start of Care: 15  Current functional level related to goals / functional outcomes: Mod I with mobility, moderate cues required to reduce speed and impulsive behavior    Remaining deficits: Decreased balance, decreased safety awareness, impulsiveness, impaired gait kinematics    Education / Equipment: HEP   Patient agrees to discharge. Patient goals were met. Patient is being discharged due to meeting the stated rehab goals.    PT End of Session - 10/03/22 1405     Visit Number 15    Number of Visits 21    Date for PT Re-Evaluation 10/09/22    Authorization Type [employer]    Progress Note Due on Visit 20    PT Start Time 1403    PT Stop Time 1431   DC   PT Time Calculation (min) 28 min    Equipment Utilized During Treatment Gait belt    Activity Tolerance Patient tolerated treatment well    Behavior During Therapy WFL for tasks assessed/performed;Impulsive              Past Medical History:  Diagnosis Date   Chronic kidney disease, stage 3 01/11/2021   ED (erectile dysfunction)    Essential hypertension 06/05/2017   Frequent falls    Gait abnormality    Gastroesophageal reflux disease 01/11/2021   Major depressive disorder    Microalbuminuria due to type 2 diabetes mellitus 05/25/2020   Mild cognitive impairment of uncertain or unknown etiology    Mixed hyperlipidemia 02/28/2016   Multiple lacunar infarcts    Plantar fasciitis    Prostatitis    Pure hypercholesterolemia 01/11/2021   PVC's (premature ventricular contractions) 01/28/2022   Uncontrolled type 2 diabetes mellitus with hyperglycemia, with long-term current use of insulin 02/28/2016   Past Surgical History:  Procedure Laterality Date   CIRCUMCISION     COLONOSCOPY      MOUTH SURGERY  01/2020   Patient Active Problem List   Diagnosis Date Noted   Multiple lacunar infarcts    Frequent falls    Gait abnormality    Major depressive disorder 05/30/2022   Mild cognitive impairment of uncertain or unknown etiology    PVC's (premature ventricular contractions) 01/28/2022   Pure hypercholesterolemia 01/11/2021   Gastroesophageal reflux disease 01/11/2021   Chronic kidney disease, stage 3 01/11/2021   Microalbuminuria due to type 2 diabetes mellitus 05/25/2020   Essential hypertension 06/05/2017   Uncontrolled type 2 diabetes mellitus with hyperglycemia, with long-term current use of insulin ([place]) 02/28/2016   Mixed hyperlipidemia 02/28/2016    PCP: Antony Contras, MD  REFERRING PROVIDER: Penni Bombard, MD  ONSET DATE: 07/03/2022   REFERRING DIAG: G20 (ICD-10-CM) - Parkinsonism, unspecified Parkinsonism type ([place]); R29.6 (ICD-10-CM) - Frequent falls; R26.9 (ICD-10-CM) - Gait abnormality   THERAPY DIAG:  Other abnormalities of gait and mobility  Muscle weakness (generalized)  Unsteadiness on feet  Rationale for Evaluation and Treatment Rehabilitation  SUBJECTIVE:   SUBJECTIVE STATEMENT: Patient reports doing well- did wear his lifealert but declined a sticker because he "did not get enough stickers for candy". No falls or near misses. Is ready to DC today.   PERTINENT HISTORY: diabetes, hypercholesterolemia, high blood pressure, Parkinsonism (suspected PSP)   PAIN:  Are you having pain? No  PRECAUTIONS: Fall  OCCUPATION: retired, does a of computer work or reads,  naps  PLOF: Independent with gait and Independent with transfers, occasional SPC use  PATIENT GOALS "to get where it doesn't hurt when I go up and down the steps"   OBJECTIVE:   DIAGNOSTIC FINDINGS:  IMPRESSION: (L knee xray 06/27/22) Minimal medial compartment osteoarthritis.  IMPRESSION: (R knee xray 06/27/22) Minimal medial and patellofemoral  osteoarthritis.  COGNITION: Overall cognitive status: Impaired     TODAY'S TREATMENT: Ther Act  LTG Assessment   [facility] PT Assessment - 10/03/22 1409       Mini-BESTest   Sit To Stand Normal: Comes to stand without use of hands and stabilizes independently.    Rise to Toes < 3 s.    Stand on one leg (left) Moderate: < 20 s   1.5s   Stand on one leg (right) Moderate: < 20 s   3s   Stand on one leg - lowest score 1    Compensatory Stepping Correction - Forward Moderate: More than one step is required to recover equilibrium   2 small steps   Compensatory Stepping Correction - Backward Moderate: More than one step is required to recover equilibrium   Multiple small steps   Compensatory Stepping Correction - Left Lateral Severe: Falls, or cannot step    Compensatory Stepping Correction - Right Lateral Moderate: Several steps to recover equilibrium   crossover step   Stepping Corredtion Lateral - lowest score 0    Stance - Feet together, eyes open, firm surface  Normal: 30s    Stance - Feet together, eyes closed, foam surface  Normal: 30s    Incline - Eyes Closed Moderate: Stands independently < 30s OR aligns with surface   17.4s, significant posterior LOB   Change in Gait Speed Moderate: Unable to change walking speed or signs of imbalance    Walk with head turns - Horizontal Moderate: performs head turns with reduction in gait speed.    Walk with pivot turns Moderate:Turns with feet close SLOW (>4 steps) with good balance.    Step over obstacles Normal: Able to step over box with minimal change of gait speed and with good balance.    Timed UP & GO with Dual Task Moderate: Dual Task affects either counting OR walking (>10%) when compared to the TUG without Dual Task.    Mini-BEST total score 16      Timed Up and Go Test   Normal TUG (seconds) 9.59    Cognitive TUG (seconds) 10.68   retro counting by 1s starting at 87          Reviewed 3 things to assess if pt has a fall, which pt  able to recall well. Also educated pt and wife on setting up 75moPD eval once finished w/OT. Educated pt and wife on how to obtain new referral for PT if mobility needs change prior to 621moval.    PATIENT EDUCATION:  Education details: Continue HEP, goal outcomes Person educated: Patient and Spouse Education method: Explanation Education comprehension: verbalized understanding   HOME EXERCISE PROGRAM: Access Code: [email or web address] URL: [email or web address] Date: 09/26/2022 Prepared by: JeEstevan RyderExercises - Supine Bridge  - 1 x daily - 7 x weekly - 3 sets - 10 reps - Seated Hamstring Stretch  - 1 x daily - 7 x weekly - 3 sets - 30s hold - Scapular Retraction with Resistance  - 1 x daily - 7 x weekly - 3 sets - 10 reps - Sidelying Hip Abduction  - 1 x daily - 7  x weekly - 3 sets - 10 reps - Supine March  - 1 x daily - 7 x weekly - 3 sets - 10 reps - Supine Heel Slide  - 1 x daily - 7 x weekly - 3 sets - 10 reps - Supine Lower Trunk Rotation  - 1 x daily - 7 x weekly - 3 sets - 10 reps - Dead Bug  - 1 x daily - 7 x weekly - 3 sets - 10 reps   ASSESSMENT:  CLINICAL IMPRESSION: Emphasis of skilled PT session on LTG assessment and DC from PT. Pt did not meet his final LTG, scoring a 16/28 on MiniBest, down from an 18/28 on eval. Pt continues to have difficulty w/vestibular input, turns and slowing down. Strongly encouraged pt to continue to work on HEP at home to maintain gains made in therapy and slow progression of disease. Pt and wife verbalized understanding. Pt verbalized readiness to DC from PT today.      OBJECTIVE IMPAIRMENTS Abnormal gait, decreased balance, increased edema, impaired perceived functional ability, and pain.   ACTIVITY LIMITATIONS lifting, bending, squatting, stairs, and transfers  PARTICIPATION LIMITATIONS: cleaning, driving, community activity, and yard work  PERSONAL FACTORS 1-2 comorbidities:    diabetes, hypercholesterolemia, high blood  pressure, and possible Parkinsonism are also affecting patient's functional outcome.   REHAB POTENTIAL: Fair due to impaired cognition and lack of family support  CLINICAL DECISION MAKING: Stable/uncomplicated  EVALUATION COMPLEXITY: Low   GOALS: Goals reviewed with patient? Yes   NEW SHORT TERM GOALS FOR UPDATED POC:   Target date: 09/04/2022  Pt will perform initial HEP with min A from wife for improved strength, balance, transfers and gait.  Baseline: not yet established  Goal status: NOT MET  2. Patient will improve MiniBESTest score to >/= 18/28 to demonstrate decreased risk for falling Baseline: 14/28; (10/24) 16/28 Goal status: NOT MET  3.  Pt will improve 5x STS to </= 9 sec to demo improved functional LE strength and balance  Baseline: 12.8; 15.69s without UE support  Goal status: NOT MET  4.  Pt and wife will verbalize and demonstrate fall prevention techniques in the home and community setting for reduced fall risk and maintained independence  Baseline: pt unable to immediately recall steps Goal status: NOT MET  5.  Pt will ambulate >300' on level and unlevel surfaces w/LRAD and min A for improved functional mobility and safety w/ambulation  Baseline: 8' with SPC + supervision Goal status: MET  NEW LONG TERM GOALS FOR UPDATED POC:  Target date: 10/02/2022  Pt will perform floor transfers w/S* for improved fall recovery and safety at home  Baseline: able to complete with supervision Goal status: MET  2.  Pt will ascend/descend 12 steps using single rail either sideways or step-to pattern w/S* for improved household mobility and safety  Baseline: 12 steps with supervision and R HR, reciprocal pattern Goal status: MET  3.  Patient will improve MiniBESTest score to >/=22/28 to demonstrate decreased risk for falling Baseline: 18/28; 16/28 Goal status: NOT MET  4.  Pt will perform final HEP w/min A from wife for improved strength, balance, transfers and  gait.  Baseline: provided Goal status: MET  5.  Pt will ambulate >500' on level and unlevel surfaces w/LRAD and S* for improved functional mobility and independence  Baseline: completed 6MWT: 1259' Goal status: MET    PLAN: PT FREQUENCY: 2x/week (recert)  PT DURATION: 8 weeks (recert)  PLANNED INTERVENTIONS: Therapeutic exercises, Therapeutic activity,  Neuromuscular re-education, Balance training, Gait training, Patient/Family education, Self Care, Joint mobilization, Joint manipulation, Stair training, DME instructions, Aquatic Therapy, Dry Needling, Electrical stimulation, Cryotherapy, Moist heat, Compression bandaging, Taping, Ultrasound, Biofeedback, Ionotophoresis 55m/ml Dexamethasone, Manual therapy, and Re-evaluation   Lacreshia Bondarenko E Lelaina Oatis, PT, DPT 10/03/2022, 2:31 PM

## 2022-10-04 DIAGNOSIS — E782 Mixed hyperlipidemia: Secondary | ICD-10-CM | POA: Diagnosis not present

## 2022-10-04 DIAGNOSIS — E1122 Type 2 diabetes mellitus with diabetic chronic kidney disease: Secondary | ICD-10-CM | POA: Diagnosis not present

## 2022-10-04 DIAGNOSIS — F329 Major depressive disorder, single episode, unspecified: Secondary | ICD-10-CM | POA: Diagnosis not present

## 2022-10-04 DIAGNOSIS — I10 Essential (primary) hypertension: Secondary | ICD-10-CM | POA: Diagnosis not present

## 2022-10-07 ENCOUNTER — Other Ambulatory Visit: Payer: Self-pay | Admitting: Endocrinology

## 2022-10-07 DIAGNOSIS — Z794 Long term (current) use of insulin: Secondary | ICD-10-CM

## 2022-10-08 ENCOUNTER — Encounter: Payer: PPO | Admitting: Speech Pathology

## 2022-10-09 ENCOUNTER — Other Ambulatory Visit: Payer: Self-pay | Admitting: Endocrinology

## 2022-10-09 DIAGNOSIS — E1165 Type 2 diabetes mellitus with hyperglycemia: Secondary | ICD-10-CM

## 2022-10-14 DIAGNOSIS — F341 Dysthymic disorder: Secondary | ICD-10-CM | POA: Diagnosis not present

## 2022-10-15 ENCOUNTER — Ambulatory Visit: Payer: PPO | Admitting: Occupational Therapy

## 2022-10-15 ENCOUNTER — Encounter: Payer: PPO | Admitting: Speech Pathology

## 2022-10-15 ENCOUNTER — Other Ambulatory Visit: Payer: Self-pay

## 2022-10-15 DIAGNOSIS — M6281 Muscle weakness (generalized): Secondary | ICD-10-CM

## 2022-10-15 DIAGNOSIS — R2681 Unsteadiness on feet: Secondary | ICD-10-CM

## 2022-10-15 DIAGNOSIS — R29818 Other symptoms and signs involving the nervous system: Secondary | ICD-10-CM

## 2022-10-15 DIAGNOSIS — E1165 Type 2 diabetes mellitus with hyperglycemia: Secondary | ICD-10-CM

## 2022-10-15 DIAGNOSIS — R293 Abnormal posture: Secondary | ICD-10-CM

## 2022-10-15 DIAGNOSIS — R2689 Other abnormalities of gait and mobility: Secondary | ICD-10-CM

## 2022-10-15 DIAGNOSIS — R278 Other lack of coordination: Secondary | ICD-10-CM

## 2022-10-15 MED ORDER — KROGER PEN NEEDLES 32G X 4 MM MISC: 0 refills | Status: DC

## 2022-10-15 NOTE — Therapy (Signed)
OUTPATIENT OCCUPATIONAL THERAPY PARKINSON'S Treatment  Patient Name: Joel Webb MRN: [id number] DOB:[birth year], 76 y.o., male Today's Date: 10/15/2022  PCP: Dr. Moreen Fowler REFERRING PROVIDER: Dr. Leta Baptist     Past Medical History:  Diagnosis Date   Chronic kidney disease, stage 3 01/11/2021   ED (erectile dysfunction)    Essential hypertension 06/05/2017   Frequent falls    Gait abnormality    Gastroesophageal reflux disease 01/11/2021   Major depressive disorder    Microalbuminuria due to type 2 diabetes mellitus 05/25/2020   Mild cognitive impairment of uncertain or unknown etiology    Mixed hyperlipidemia 02/28/2016   Multiple lacunar infarcts    Plantar fasciitis    Prostatitis    Pure hypercholesterolemia 01/11/2021   PVC's (premature ventricular contractions) 01/28/2022   Uncontrolled type 2 diabetes mellitus with hyperglycemia, with long-term current use of insulin 02/28/2016   Past Surgical History:  Procedure Laterality Date   CIRCUMCISION     COLONOSCOPY     MOUTH SURGERY  01/2020   Patient Active Problem List   Diagnosis Date Noted   Multiple lacunar infarcts    Frequent falls    Gait abnormality    Major depressive disorder 05/30/2022   Mild cognitive impairment of uncertain or unknown etiology    PVC's (premature ventricular contractions) 01/28/2022   Pure hypercholesterolemia 01/11/2021   Gastroesophageal reflux disease 01/11/2021   Chronic kidney disease, stage 3 01/11/2021   Microalbuminuria due to type 2 diabetes mellitus 05/25/2020   Essential hypertension 06/05/2017   Uncontrolled type 2 diabetes mellitus with hyperglycemia, with long-term current use of insulin ([place]) 02/28/2016   Mixed hyperlipidemia 02/28/2016    ONSET DATE: referral 08/15/22  REFERRING DIAG: Parkinsonism  THERAPY DIAG:  No diagnosis found.  Rationale for Evaluation and Treatment: Rehabilitation  SUBJECTIVE:   SUBJECTIVE STATEMENT: Pt wants to play golf Pt  accompanied by: significant other  PERTINENT HISTORY: diabetes, hypercholesterolemia, high blood pressure, Parkinsonism possible PSP, hx of CVA , visual deficits L side   02/23/22 MRI brain 1. No reversible cause for symptoms. 2. Stable cortical and cerebellar volume loss. Somewhat prominent midbrain thinning, are there symptoms of supranuclear palsy? 3. Interval lacunar infarct in the right putamen, but overall mild chronic small vessel ischemia for age.   03/04/22 PET brain  - No significant loss of cortical metabolism to suggest Alzheimer's type dementia. No evidence of frontotemporal dementia   07/25/22 DATscan Significant bilateral decreased radiotracer activity within LEFT and RIGHT striatum. Deficit greater on the RIGHT. Findings suggestive of Parkinson's syndrome pathologies.    PRECAUTIONS: Fall   WEIGHT BEARING RESTRICTIONS: No  PAIN:  Are you having pain? No  FALLS: Has patient fallen in last 6 months? Yes. Number of falls multiple  LIVING ENVIRONMENT: Lives with: lives with their spouse Lives in: House/apartment Stairs: yes, has stair lift Has following equipment at home: stair lift , walkin shower  PLOF: Independent  PATIENT GOALS: stop losing balance, play golf  OBJECTIVE:   HAND DOMINANCE: Right  ADLs: Overall ADLs: Pt is performing basic ADLs mod I however he has recent hx of falls Transfers/ambulation related to ADLs: Eating: difficulty with swallowing  Grooming: mod I UB Dressing: mod I LB Dressing: mod I Toileting: mod I Bathing: mod I Tub Shower transfers: mod I Equipment: Grab bars and Walk in shower  IADLs: Shopping: grocery shops  with wife dropping him off Light housekeeping: washes dishes, does own laundry  Meal Prep: cooks occasionally  Community mobility: mod I with rollator Medication management:  needs assist due to cognition, however pt is filling up pillbox Financial management: wife handles Handwriting:  NT  MOBILITY STATUS:  Hx of falls  POSTURE COMMENTS:  rounded shoulders and forward head   FUNCTIONAL OUTCOME MEASURES: Fastening/unfastening 3 buttons: 52.41 Physical performance test: PPT#2 (simulated eating) 10.82 & PPT#4 (donning/doffing jacket): 30 secs  COORDINATION: 9 Hole Peg test: Right: 29.90 sec; Left: 49.28 sec Box and Blocks:  Right 40blocks, Left 29 blocks   UE ROM:  WFL  UE MMT:   WFL  SENSATION: Light touch: WFL  Vision: Pt is unable to track inferiorly which is consistent with PSP, difficulty tracking superiorly  COGNITION: Overall cognitive status: Impaired, short term memory deficits, slowed processing  OBSERVATIONS: Bradykinesia   TODAY'S TREATMENT:      Pt/ family education regarding: keeping thinking skills sharp, importance of setting up reminders for meds and checking blood sugar on phone, beginning coordination HEP, recommendation that pt's wife is involved in credit card use as pt has been ordering online. Pt demonstrates poor awareness and short term memory                                                                                                                  DATE:10/15/22   PATIENT EDUCATION: Education details:keeping thinking skills sharp, importance of setting up reminders for meds and checking blood sugar on phone, beginning coordination HEP, recommendation that pt's wife is involved in credit card use as pt has been ordering online. Person educated: Patient and Spouse Education method: Explanation, demonstration, handouts Education comprehension: verbalized understanding, returned demonstration, mod v.c cues, needs reinforcement  HOME EXERCISE PROGRAM: N/A  GOALS: Potential goals discussed with pt/ wife  SHORT TERM GOALS: Target date:10/18/22       I with HEP. Baseline: Goal status: INITIAL  2.  Pt will verbalize understanding of adapted strategies to maximize safety and I with ADLs/ IADLs  including strategies to reduce falls.. Baseline:   Goal status: INITIAL  3.  Pt will demonstrate improved LUE functional use as evidenced by increasing box/ blocks score by 3 blocks Baseline:  Goal status: INITIAL  4.  Pt will demonstrate understanding of memory compensations and ways to keep thinking skills sharp Baseline:  Goal status: INITIAL  5. Pt will write a his name with 100% legibility and minimal decrease in letter size Baseline:  Goal status: INITIAL  6. Pt/ wife will verbalize understanding of compensatory strategies for visual deficits.  Goal status: inital  LONG TERM GOALS: Target date:12/13/22   Pt will verbalize understanding of ways to prevent future  complications and PD community resources. Baseline:  Goal status: INITIAL  2.  Pt will demonstrate improved fine motor coordination for ADLs as evidenced by decreasing 9 hole peg test score for LUE by 3 secs Baseline:  Goal status: INITIAL  3.  Pt will demonstrate increased ease with dressing as eveidenced by decreasing PPT#4(don/ doff jacket) to 25 secs or less Baseline:  Goal status: INITIAL  4.  Pt will demonstrate improved ease  with fastening buttons as evidenced by decreasing 3 button/ unbutton time to :48 secs or less  Baseline:  Goal status: INITIAL   ASSESSMENT:  CLINICAL IMPRESSION:Pt' progress is  limited by cognitive deficits including memory and decreased awareness.  PERFORMANCE DEFICITS: in functional skills including ADLs, IADLs, coordination, dexterity, flexibility, Fine motor control, Gross motor control, mobility, balance, decreased knowledge of precautions, decreased knowledge of use of DME, vision, and UE functional use, cognitive skills including attention, energy/drive, learn, memory, perception, problem solving, safety awareness, thought, and understand, and psychosocial skills including coping strategies, environmental adaptation, habits, interpersonal interactions, and routines and behaviors.   IMPAIRMENTS: are limiting patient from  ADLs, IADLs, play, leisure, and social participation.   COMORBIDITIES:  may have co-morbidities  that affects occupational performance. Patient will benefit from skilled OT to address above impairments and improve overall function.  MODIFICATION OR ASSISTANCE TO COMPLETE EVALUATION: Min-Moderate modification of tasks or assist with assess necessary to complete an evaluation.  OT OCCUPATIONAL PROFILE AND HISTORY: Detailed assessment: Review of records and additional review of physical, cognitive, psychosocial history related to current functional performance.  CLINICAL DECISION MAKING: Moderate - several treatment options, min-mod task modification necessary  REHAB POTENTIAL: Good  EVALUATION COMPLEXITY: Moderate    PLAN:  OT FREQUENCY: 2x/week  OT DURATION: 12 weeks  PLANNED INTERVENTIONS: self care/ADL training, therapeutic exercise, therapeutic activity, neuromuscular re-education, manual therapy, passive range of motion, balance training, functional mobility training, aquatic therapy, splinting, ultrasound, paraffin, fluidotherapy, moist heat, cryotherapy, contrast bath, patient/family education, cognitive remediation/compensation, visual/perceptual remediation/compensation, energy conservation, coping strategies training, DME and/or AE instructions, and Re-evaluation  RECOMMENDED OTHER SERVICES: n/a  CONSULTED AND AGREED WITH PLAN OF CARE: Patient  PLAN FOR NEXT SESSION: ADL strategies, compensation for visual deficits- pt unable to track downward, handwriting   Monta Maiorana, OT 10/15/2022, 3:56 PM

## 2022-10-15 NOTE — Patient Instructions (Addendum)
Set up a reminder on phone at 11 AM to take pills Set up reminder on phone to check blood sugar at 11 AM, and 500 pm Take insulin if blood sugar is elevated  Keeping Thinking Skills Sharp: 1. Jigsaw puzzles 2. Card/board games 3. Talking on the phone/social events 4. [email or web address] 5. Online games 6. Word serches/crossword puzzles 7.  Logic puzzles 8. Aerobic exercise (stationary bike) 9. Eating balanced diet (fruits & veggies) 10. Drink water      Coordination Exercises- perform sitting  Perform the following exercises for 20 minutes 1 times per day. Perform with both hand(s). Perform using big movements.  Flipping Cards: Place deck of cards on the table. Flip cards over by opening your hand big to grasp and then turn your palm up big. Deal cards: Hold 1/2 or whole deck in your hand. Use thumb to push card off top of deck with one big push. Rotate ball with fingertips: Pick up with fingers/thumb and move as much as you can with each turn/movement (clockwise and counter-clockwise).

## 2022-10-17 ENCOUNTER — Other Ambulatory Visit: Payer: Self-pay

## 2022-10-17 ENCOUNTER — Ambulatory Visit: Payer: PPO | Admitting: Speech Pathology

## 2022-10-17 DIAGNOSIS — R2681 Unsteadiness on feet: Secondary | ICD-10-CM | POA: Diagnosis not present

## 2022-10-17 DIAGNOSIS — E1165 Type 2 diabetes mellitus with hyperglycemia: Secondary | ICD-10-CM

## 2022-10-17 DIAGNOSIS — R4189 Other symptoms and signs involving cognitive functions and awareness: Secondary | ICD-10-CM

## 2022-10-17 DIAGNOSIS — R1310 Dysphagia, unspecified: Secondary | ICD-10-CM

## 2022-10-17 NOTE — Therapy (Signed)
OUTPATIENT SPEECH LANGUAGE PATHOLOGY TREATMENT NOTE   Patient Name: Joel Webb MRN: [id number] DOB:05-10-1946, 76 y.o., male Today's Date: 10/17/2022  PCP: Antony Contras REFERRING PROVIDER: Andrey Spearman   End of Session - 10/17/22 1314     Visit Number 6    Number of Visits 16    Date for SLP Re-Evaluation 10/29/22    Authorization Type [employer]    SLP Start Time 1317    SLP Stop Time  1400    SLP Time Calculation (min) 43 min    Activity Tolerance Patient tolerated treatment well              Past Medical History:  Diagnosis Date   Chronic kidney disease, stage 3 01/11/2021   ED (erectile dysfunction)    Essential hypertension 06/05/2017   Frequent falls    Gait abnormality    Gastroesophageal reflux disease 01/11/2021   Major depressive disorder    Microalbuminuria due to type 2 diabetes mellitus 05/25/2020   Mild cognitive impairment of uncertain or unknown etiology    Mixed hyperlipidemia 02/28/2016   Multiple lacunar infarcts    Plantar fasciitis    Prostatitis    Pure hypercholesterolemia 01/11/2021   PVC's (premature ventricular contractions) 01/28/2022   Uncontrolled type 2 diabetes mellitus with hyperglycemia, with long-term current use of insulin 02/28/2016   Past Surgical History:  Procedure Laterality Date   CIRCUMCISION     COLONOSCOPY     MOUTH SURGERY  01/2020   Patient Active Problem List   Diagnosis Date Noted   Multiple lacunar infarcts    Frequent falls    Gait abnormality    Major depressive disorder 05/30/2022   Mild cognitive impairment of uncertain or unknown etiology    PVC's (premature ventricular contractions) 01/28/2022   Pure hypercholesterolemia 01/11/2021   Gastroesophageal reflux disease 01/11/2021   Chronic kidney disease, stage 3 01/11/2021   Microalbuminuria due to type 2 diabetes mellitus 05/25/2020   Essential hypertension 06/05/2017   Uncontrolled type 2 diabetes mellitus with hyperglycemia,  with long-term current use of insulin ([place]) 02/28/2016   Mixed hyperlipidemia 02/28/2016    ONSET DATE: 09/03/22  REFERRING DIAG: Progressive Supranuclear Palsy  THERAPY DIAG:  Cognitive deficits  Dysphagia, unspecified type  Rationale for Evaluation and Treatment Rehabilitation  SUBJECTIVE:   SUBJECTIVE STATEMENT:  "I'm having trouble using my phone"   PAIN:  Are you having pain? No   OBJECTIVE:   TODAY'S TREATMENT:  10-17-22: To aid in increased independence in glucose monitoring, to include recall of numbers, SLP A pt in downloading app to phone, and creating account to access. SLP assisted pt in organization of cell phone home screen to aid in ease of use, with putting most important or primarily utilized apps in prominent position.   09-26-22: Spouse questioning need for 2x week ST schedule. SLP educates on role of ST in cognitive rehabilitation. Focus for pt is participation and safety in home based and preferred avocational activities and reduction of caregiver burden. Pt and spouse verbalize understanding, desire to reduce frequency to 1x week at this time. SLP cancelled appointments to accommodate request and provided updated schedule. SLP A pt in generating strategies to maximize likelihood of pt wearing life alert, see handout. Education on benefit of creating routines and habits to aid in recall of mundane but required activities. Pt tells SLP "I think that's helpful. Additional education provided on verbal cueing likely needed from spouse to aid in establishment of habits.   09-24-22: Pt supports  SLP administering MBSS reviewed x-ray slides and provided education. Pt recall swallow strategies with supervision A verbal cues and reports occasional s/s aspiration when consuming thin liquids due to impulsivity. Pt reports not utilizing Alexia for external strategies due to unable to recall strategy, but reports recalling to walk scooter without need for recall strategy. SLP  facilitated mildly complex problem solving and recall strategies in calendar task, pt required supervision A verbal cues. In organization reading task based on directions pt required mod A verbal cues to slow reading rate in order to process information and monitor errors. SLP educated pt on strategy of reading aloud to slow rate and allow time for processing, pt suggested reading to grandson aloud. Pt consumed thin liquids via cup, mod I for small sips and no overt s/s aspiration. SLP provided HEP of mildly complex problem solving worksheet with instructions to read aloud and monitor errors.   09-17-22: SLP provides education on upcoming MBSS and what to expect. All pt's and spouse's questions answered to satisfaction. Additional education on considering wishes for future care and decisions re: swallow function as we know dyspahgia is a common complication associated with pt's dx of PSP. Generated memory strategies to aid in recall of evening blood pressure and sugar+insulin administration. Generated use of Alexia as external aid and coupling with walking scooter as internal aid. Pt to trial over next week and report back success.   09-10-22: Re-education provided on swallow precautions and use of intentional swallow. Recommend limiting distractions at meal times to aid in focus. Education on PSP impact on swallow, to include saliva management. Pt reports to increased drooling and difficulty with "nasal drip." SLP suspects some pharyngeal residue sensation may be 2/2 reduced volitional swallow for saliva management. Recommend strategy of either chewing gum or intentional swallow prior to speaking. Generated strategy to A in recall of turning off burners (red tape in off position) and checking blood pressure when waking (making part of routine) as solutions to pt reported issues currently affecting successful participation. Will plan to trial errorless learning to aid in visual scanning of stove dials next  session.    09-03-22: SLP administered cognitive linguistic assessment CLQT and BSE. Pt and pt's wife report no acute changes in speech and voice. Pt reports difficulty with cognitive skills, word finding and swallow function. SLP recommended MBS to further assess swallow function and continued current diet regular textures and thin liquids with precautions effortful/intental swallow and to separate solids/liquids. SLP and pt created plan and all questions answered to satisfaction.   PATIENT EDUCATION: Education details: swallow strategies and need for MBS to further assess swallow function.  Person educated: Patient and Spouse Education method: Customer service manager Education comprehension: verbalized understanding   HOME EXERCISE PROGRAM: HEP will be created next session due to time restraints.    GOALS: Goals reviewed with patient? Yes  SHORT TERM GOALS: Target date: 10/01/2022    Pt will demonstrate mildly complex problem solving/executive function skills with supervision A verbal cues for 80% accuracy.  Baseline: 09/26/2022 Goal status: MET  2.  Pt will demonstrate use of internal/external recall strategies in functional tasks with min A verbal cues for 80% accuracy. Baseline:  Goal status: NOT MET  3.  Pt will demonstrate sustained and selective attention in functional task for 10-15 minute intervals with supervision A verbal cues for redirection.  Baseline: 09/24/2022 Goal status: MET  4.  Pt will self-monitor and self-correct functional errors with min A verbal cues for 80% accuracy. Baseline:  Goal status: NOT MET  5.  Pt will demonstrate word finding skills in structured and unstructured tasks with min A verbal cues for 80% accuracy.  Baseline:  Goal status: NOT MET  6.  Pt will participate in instrumental swallow assessment for further assessment of swallow function.  Baseline:  Goal status: MET  LONG TERM GOALS: Target date: 10/29/2022    Pt will  demonstrate mildly complex to complex problem solving skills with supervision A verbal cues for 90% accuracy.  Baseline:  Goal status: IN PROGRESS  2.  Pt will demonstrate use of internal/external recall strategies in functional tasks with supervision A verbal cues for 80% accuracy. Baseline:  Goal status: IN PROGRESS  3.   Pt will demonstrate sustained and selective attention in functional task for 15-20 minute intervals with supervision A verbal cues for redirection. Baseline:  Goal status: IN PROGRESS  4.    Pt will self-monitor and self-correct functional errors with supervision A verbal cues for 80% accuracy. Baseline:  Goal status: IN PROGRESS  5.  Pt will demonstrate word finding skills in structured and unstructured tasks with supervision A verbal cues for 80% accuracy.  Baseline:  Goal status: IN PROGRESS  6.  Pt will consume least restrictive diet with supervision A verbal cues for strategies and exercises to improvement swallow function.  Baseline:  Goal status: IN PROGRESS  ASSESSMENT:  CLINICAL IMPRESSION: Patient is a 76 y.o. male who was seen today for cognitive linguistics and swallow evaluation. Pt presents with mild cognitive linguistic deficits supports by score on CLQT indicating mild deficits in executive function and visual spatial awareness and WFL (but at the low end) in attention, memory and language. Pt supports changes since PSP diagnosis in the above mention areas. Pt expressed difficulty swallowing, often coughing on thin liquids and solids for the past year with no h/o of PNA. Oral motor exam indicated repetitive nonfunctional lingual movements and reduced lingual coordination, with remaining areas being [facility]. Pt consumed thin liquids via straw ([place] 3oz water test) and regular textures with only mild reduced bolus formation (on regular textures) and possible delay in swallow initiation and x1 delayed throat clear on thin liquids (pt's vocal quality appeared  dry after occurrence.) Pt demonstrated reduced bolus cohesion on mixed consistencies, possible delay in swallow initiation and immediate coughing episode. SLP recommends continuing regular textures and thin liquid diet with strict instructions to separate consistencies along with a slow rate and small bites/sips. Pt and pt's wife agreed and supported no acute changes in speech function. Pt would benefit from skilled ST services in order to maximize functional skills and reduce burden of care.   OBJECTIVE IMPAIRMENTS  Objective impairments include attention, memory, awareness, executive functioning, expressive language, and dysphagia. These impairments are limiting patient from managing appointments, managing finances, household responsibilities, and safety when swallowing.Factors affecting potential to achieve goals and functional outcome are ability to learn/carryover information, co-morbidities, and severity of impairments.. Patient will benefit from skilled SLP services to address above impairments and improve overall function.  REHAB POTENTIAL: Good  PLAN: SLP FREQUENCY: 2x/week  SLP DURATION: 8 weeks  PLANNED INTERVENTIONS: Aspiration precaution training, Pharyngeal strengthening exercises, Diet toleration management , Trials of upgraded texture/liquids, Cueing hierachy, Cognitive reorganization, Internal/external aids, SLP instruction and feedback, Compensatory strategies, and Patient/family education   Su Monks, [place] 10/17/2022, 1:56 PM

## 2022-10-22 ENCOUNTER — Encounter: Payer: Self-pay | Admitting: Occupational Therapy

## 2022-10-22 ENCOUNTER — Ambulatory Visit: Payer: PPO | Admitting: Occupational Therapy

## 2022-10-22 ENCOUNTER — Ambulatory Visit: Payer: PPO | Attending: Diagnostic Neuroimaging | Admitting: Speech Pathology

## 2022-10-22 DIAGNOSIS — R278 Other lack of coordination: Secondary | ICD-10-CM | POA: Insufficient documentation

## 2022-10-22 DIAGNOSIS — R4189 Other symptoms and signs involving cognitive functions and awareness: Secondary | ICD-10-CM | POA: Insufficient documentation

## 2022-10-22 DIAGNOSIS — R2689 Other abnormalities of gait and mobility: Secondary | ICD-10-CM | POA: Insufficient documentation

## 2022-10-22 DIAGNOSIS — R293 Abnormal posture: Secondary | ICD-10-CM

## 2022-10-22 DIAGNOSIS — R2681 Unsteadiness on feet: Secondary | ICD-10-CM | POA: Diagnosis not present

## 2022-10-22 DIAGNOSIS — M6281 Muscle weakness (generalized): Secondary | ICD-10-CM | POA: Insufficient documentation

## 2022-10-22 DIAGNOSIS — R29818 Other symptoms and signs involving the nervous system: Secondary | ICD-10-CM

## 2022-10-22 DIAGNOSIS — R1310 Dysphagia, unspecified: Secondary | ICD-10-CM | POA: Insufficient documentation

## 2022-10-22 NOTE — Patient Instructions (Addendum)
  STRATEGIES FOR SELF CARE AND FALL PREVENTION  1) Wider staggered stance when doing standing activities  2) When going from sit to stand, keep weight more forward over feet (not on heels)   3) Hold onto grab bars with one hand while washing/rinsing hair and washing lower legs and feet. Suggest also the long handled sponge  4) For opening twist lids: turn big with each turn, lift hand and reposition b/t each turn. Keep Rt hand still on jar  5) Place shelf liner under plate. Just make sure position of hands or middle of utensils

## 2022-10-22 NOTE — Therapy (Signed)
OUTPATIENT SPEECH LANGUAGE PATHOLOGY TREATMENT NOTE   Patient Name: Joel Webb MRN: [id number] DOB:05/08/1946, 76 y.o., male Today's Date: 10/22/2022  PCP: Antony Contras REFERRING PROVIDER: Andrey Spearman      Past Medical History:  Diagnosis Date   Chronic kidney disease, stage 3 01/11/2021   ED (erectile dysfunction)    Essential hypertension 06/05/2017   Frequent falls    Gait abnormality    Gastroesophageal reflux disease 01/11/2021   Major depressive disorder    Microalbuminuria due to type 2 diabetes mellitus 05/25/2020   Mild cognitive impairment of uncertain or unknown etiology    Mixed hyperlipidemia 02/28/2016   Multiple lacunar infarcts    Plantar fasciitis    Prostatitis    Pure hypercholesterolemia 01/11/2021   PVC's (premature ventricular contractions) 01/28/2022   Uncontrolled type 2 diabetes mellitus with hyperglycemia, with long-term current use of insulin 02/28/2016   Past Surgical History:  Procedure Laterality Date   CIRCUMCISION     COLONOSCOPY     MOUTH SURGERY  01/2020   Patient Active Problem List   Diagnosis Date Noted   Multiple lacunar infarcts    Frequent falls    Gait abnormality    Major depressive disorder 05/30/2022   Mild cognitive impairment of uncertain or unknown etiology    PVC's (premature ventricular contractions) 01/28/2022   Pure hypercholesterolemia 01/11/2021   Gastroesophageal reflux disease 01/11/2021   Chronic kidney disease, stage 3 01/11/2021   Microalbuminuria due to type 2 diabetes mellitus 05/25/2020   Essential hypertension 06/05/2017   Uncontrolled type 2 diabetes mellitus with hyperglycemia, with long-term current use of insulin ([place]) 02/28/2016   Mixed hyperlipidemia 02/28/2016    ONSET DATE: 09/03/22  REFERRING DIAG: Progressive Supranuclear Palsy  THERAPY DIAG:  No diagnosis found.  Rationale for Evaluation and Treatment Rehabilitation  SUBJECTIVE:   SUBJECTIVE STATEMENT:  "I just  can't work this phone"   PAIN:  Are you having pain? No   OBJECTIVE:   TODAY'S TREATMENT:  10-22-22: Pt c/o difficulty using phone, which is new to patient in past few months. Primary desires to use phone to communicate with loved ones. SLP aided pt in ensuring selected contacts are in favorites. Practiced navigating through contacts to text and call, with pt able to demonstrate x3. Pt able to open text message and delete with supervision A. SLP A pt in setting daily reminders for blood sugar monitoring and insulin administration. Use of reoccurrence and labeling features to aid in efficacy of alarms.  Demonstrated how to modify lock screen and download text messaged photos. Pt verbalizes understanding. Spouse supporting greater understanding of SLP role in pt's rehabilitation course.   10-17-22: To aid in increased independence in glucose monitoring, to include recall of numbers, SLP A pt in downloading app to phone, and creating account to access. SLP assisted pt in organization of cell phone home screen to aid in ease of use, with putting most important or primarily utilized apps in prominent position.   09-26-22: Spouse questioning need for 2x week ST schedule. SLP educates on role of ST in cognitive rehabilitation. Focus for pt is participation and safety in home based and preferred avocational activities and reduction of caregiver burden. Pt and spouse verbalize understanding, desire to reduce frequency to 1x week at this time. SLP cancelled appointments to accommodate request and provided updated schedule. SLP A pt in generating strategies to maximize likelihood of pt wearing life alert, see handout. Education on benefit of creating routines and habits to aid in recall  of mundane but required activities. Pt tells SLP "I think that's helpful. Additional education provided on verbal cueing likely needed from spouse to aid in establishment of habits.   09-24-22: Pt supports SLP administering MBSS  reviewed x-ray slides and provided education. Pt recall swallow strategies with supervision A verbal cues and reports occasional s/s aspiration when consuming thin liquids due to impulsivity. Pt reports not utilizing Alexia for external strategies due to unable to recall strategy, but reports recalling to walk scooter without need for recall strategy. SLP facilitated mildly complex problem solving and recall strategies in calendar task, pt required supervision A verbal cues. In organization reading task based on directions pt required mod A verbal cues to slow reading rate in order to process information and monitor errors. SLP educated pt on strategy of reading aloud to slow rate and allow time for processing, pt suggested reading to grandson aloud. Pt consumed thin liquids via cup, mod I for small sips and no overt s/s aspiration. SLP provided HEP of mildly complex problem solving worksheet with instructions to read aloud and monitor errors.   09-17-22: SLP provides education on upcoming MBSS and what to expect. All pt's and spouse's questions answered to satisfaction. Additional education on considering wishes for future care and decisions re: swallow function as we know dyspahgia is a common complication associated with pt's dx of PSP. Generated memory strategies to aid in recall of evening blood pressure and sugar+insulin administration. Generated use of Alexia as external aid and coupling with walking scooter as internal aid. Pt to trial over next week and report back success.   09-10-22: Re-education provided on swallow precautions and use of intentional swallow. Recommend limiting distractions at meal times to aid in focus. Education on PSP impact on swallow, to include saliva management. Pt reports to increased drooling and difficulty with "nasal drip." SLP suspects some pharyngeal residue sensation may be 2/2 reduced volitional swallow for saliva management. Recommend strategy of either chewing gum or  intentional swallow prior to speaking. Generated strategy to A in recall of turning off burners (red tape in off position) and checking blood pressure when waking (making part of routine) as solutions to pt reported issues currently affecting successful participation. Will plan to trial errorless learning to aid in visual scanning of stove dials next session.    09-03-22: SLP administered cognitive linguistic assessment CLQT and BSE. Pt and pt's wife report no acute changes in speech and voice. Pt reports difficulty with cognitive skills, word finding and swallow function. SLP recommended MBS to further assess swallow function and continued current diet regular textures and thin liquids with precautions effortful/intental swallow and to separate solids/liquids. SLP and pt created plan and all questions answered to satisfaction.   PATIENT EDUCATION: Education details: swallow strategies and need for MBS to further assess swallow function.  Person educated: Patient and Spouse Education method: Customer service manager Education comprehension: verbalized understanding   HOME EXERCISE PROGRAM: HEP will be created next session due to time restraints.    GOALS: Goals reviewed with patient? Yes  SHORT TERM GOALS: Target date: 10/01/2022    Pt will demonstrate mildly complex problem solving/executive function skills with supervision A verbal cues for 80% accuracy.  Baseline: 09/26/2022 Goal status: MET  2.  Pt will demonstrate use of internal/external recall strategies in functional tasks with min A verbal cues for 80% accuracy. Baseline:  Goal status: NOT MET  3.  Pt will demonstrate sustained and selective attention in functional task for 10-15 minute  intervals with supervision A verbal cues for redirection.  Baseline: 09/24/2022 Goal status: MET  4.  Pt will self-monitor and self-correct functional errors with min A verbal cues for 80% accuracy. Baseline:  Goal status: NOT MET  5.   Pt will demonstrate word finding skills in structured and unstructured tasks with min A verbal cues for 80% accuracy.  Baseline:  Goal status: NOT MET  6.  Pt will participate in instrumental swallow assessment for further assessment of swallow function.  Baseline:  Goal status: MET  LONG TERM GOALS: Target date: 10/29/2022    Pt will demonstrate mildly complex to complex problem solving skills with supervision A verbal cues for 90% accuracy.  Baseline:  Goal status: IN PROGRESS  2.  Pt will demonstrate use of internal/external recall strategies in functional tasks with supervision A verbal cues for 80% accuracy. Baseline:  Goal status: IN PROGRESS  3.   Pt will demonstrate sustained and selective attention in functional task for 15-20 minute intervals with supervision A verbal cues for redirection. Baseline:  Goal status: IN PROGRESS  4.    Pt will self-monitor and self-correct functional errors with supervision A verbal cues for 80% accuracy. Baseline:  Goal status: IN PROGRESS  5.  Pt will demonstrate word finding skills in structured and unstructured tasks with supervision A verbal cues for 80% accuracy.  Baseline:  Goal status: IN PROGRESS  6.  Pt will consume least restrictive diet with supervision A verbal cues for strategies and exercises to improvement swallow function.  Baseline:  Goal status: IN PROGRESS  ASSESSMENT:  CLINICAL IMPRESSION: Patient is a 76 y.o. male who was seen today for cognitive linguistics and swallow evaluation. Pt presents with mild cognitive linguistic deficits supports by score on CLQT indicating mild deficits in executive function and visual spatial awareness and WFL (but at the low end) in attention, memory and language. Pt supports changes since PSP diagnosis in the above mention areas. Pt expressed difficulty swallowing, often coughing on thin liquids and solids for the past year with no h/o of PNA. Oral motor exam indicated repetitive  nonfunctional lingual movements and reduced lingual coordination, with remaining areas being [facility]. Pt consumed thin liquids via straw ([place] 3oz water test) and regular textures with only mild reduced bolus formation (on regular textures) and possible delay in swallow initiation and x1 delayed throat clear on thin liquids (pt's vocal quality appeared dry after occurrence.) Pt demonstrated reduced bolus cohesion on mixed consistencies, possible delay in swallow initiation and immediate coughing episode. SLP recommends continuing regular textures and thin liquid diet with strict instructions to separate consistencies along with a slow rate and small bites/sips. Pt and pt's wife agreed and supported no acute changes in speech function. Pt would benefit from skilled ST services in order to maximize functional skills and reduce burden of care.   OBJECTIVE IMPAIRMENTS  Objective impairments include attention, memory, awareness, executive functioning, expressive language, and dysphagia. These impairments are limiting patient from managing appointments, managing finances, household responsibilities, and safety when swallowing.Factors affecting potential to achieve goals and functional outcome are ability to learn/carryover information, co-morbidities, and severity of impairments.. Patient will benefit from skilled SLP services to address above impairments and improve overall function.  REHAB POTENTIAL: Good  PLAN: SLP FREQUENCY: 2x/week  SLP DURATION: 8 weeks  PLANNED INTERVENTIONS: Aspiration precaution training, Pharyngeal strengthening exercises, Diet toleration management , Trials of upgraded texture/liquids, Cueing hierachy, Cognitive reorganization, Internal/external aids, SLP instruction and feedback, Compensatory strategies, and Patient/family education  Su Monks, [place] 10/22/2022, 11:47 AM

## 2022-10-22 NOTE — Patient Instructions (Signed)
Bring headphones to next session   Setting Alarm: Open clock Blue plus Set time Add label  Elta [place] reoccurring if necessary (day circles)

## 2022-10-22 NOTE — Therapy (Signed)
OUTPATIENT OCCUPATIONAL THERAPY [facility] Treatment  Patient Name: Joel Webb MRN: [id number] DOB:Apr 07, 1946, 76 y.o., male Today's Date: 10/22/2022  PCP: Dr. Moreen Fowler REFERRING PROVIDER: Dr. Leta Baptist   OT End of Session - 10/22/22 1324     Visit Number 3    Number of Visits 25    Date for OT Re-Evaluation 12/13/22    Authorization Type [employer]    OT Start Time 1322    OT Stop Time 1400    OT Time Calculation (min) 38 min    Activity Tolerance Patient tolerated treatment well    Behavior During Therapy [facility] for tasks assessed/performed;Impulsive              Past Medical History:  Diagnosis Date   Chronic kidney disease, stage 3 01/11/2021   ED (erectile dysfunction)    Essential hypertension 06/05/2017   Frequent falls    Gait abnormality    Gastroesophageal reflux disease 01/11/2021   Major depressive disorder    Microalbuminuria due to type 2 diabetes mellitus 05/25/2020   Mild cognitive impairment of uncertain or unknown etiology    Mixed hyperlipidemia 02/28/2016   Multiple lacunar infarcts    Plantar fasciitis    Prostatitis    Pure hypercholesterolemia 01/11/2021   PVC's (premature ventricular contractions) 01/28/2022   Uncontrolled type 2 diabetes mellitus with hyperglycemia, with long-term current use of insulin 02/28/2016   Past Surgical History:  Procedure Laterality Date   CIRCUMCISION     COLONOSCOPY     MOUTH SURGERY  01/2020   Patient Active Problem List   Diagnosis Date Noted   Multiple lacunar infarcts    Frequent falls    Gait abnormality    Major depressive disorder 05/30/2022   Mild cognitive impairment of uncertain or unknown etiology    PVC's (premature ventricular contractions) 01/28/2022   Pure hypercholesterolemia 01/11/2021   Gastroesophageal reflux disease 01/11/2021   Chronic kidney disease, stage 3 01/11/2021   Microalbuminuria due to type 2 diabetes mellitus 05/25/2020   Essential hypertension  06/05/2017   Uncontrolled type 2 diabetes mellitus with hyperglycemia, with long-term current use of insulin ([place]) 02/28/2016   Mixed hyperlipidemia 02/28/2016    ONSET DATE: referral 08/15/22  REFERRING DIAG: Parkinsonism  THERAPY DIAG:  Unsteadiness on feet  Other symptoms and signs involving the nervous system  Other lack of coordination  Abnormal posture  Rationale for Evaluation and Treatment: Rehabilitation  SUBJECTIVE:   SUBJECTIVE STATEMENT: No falls. Wife reports he scraped his Rt arm today but normally falls or stumbles more to the Lt or backwards Pt accompanied by: significant other  PERTINENT HISTORY: diabetes, hypercholesterolemia, high blood pressure, Parkinsonism possible PSP, hx of CVA , visual deficits L side   02/23/22 MRI brain 1. No reversible cause for symptoms. 2. Stable cortical and cerebellar volume loss. Somewhat prominent midbrain thinning, are there symptoms of supranuclear palsy? 3. Interval lacunar infarct in the right putamen, but overall mild chronic small vessel ischemia for age.   03/04/22 PET brain  - No significant loss of cortical metabolism to suggest Alzheimer's type dementia. No evidence of frontotemporal dementia   07/25/22 DATscan Significant bilateral decreased radiotracer activity within LEFT and RIGHT striatum. Deficit greater on the RIGHT. Findings suggestive of Parkinson's syndrome pathologies.    PRECAUTIONS: Fall   WEIGHT BEARING RESTRICTIONS: No  PAIN:  Are you having pain? No  FALLS: Has patient fallen in last 6 months? Yes. Number of falls multiple  LIVING ENVIRONMENT: Lives with: lives with their spouse Lives in:  House/apartment Stairs: yes, has stair lift Has following equipment at home: stair lift , walkin shower  PLOF: Independent  PATIENT GOALS: stop losing balance, play golf  OBJECTIVE:   HAND DOMINANCE: Right  ADLs: Overall ADLs: Pt is performing basic ADLs mod I however he has recent hx of  falls Transfers/ambulation related to ADLs: Eating: difficulty with swallowing  Grooming: mod I UB Dressing: mod I LB Dressing: mod I Toileting: mod I Bathing: mod I Tub Shower transfers: mod I Equipment: Grab bars and Walk in shower  IADLs: Shopping: grocery shops  with wife dropping him off Light housekeeping: washes dishes, does own laundry  Meal Prep: cooks occasionally  Community mobility: mod I with rollator Medication management: needs assist due to cognition, however pt is filling up pillbox Financial management: wife handles Handwriting:  NT  MOBILITY STATUS: Hx of falls  POSTURE COMMENTS:  rounded shoulders and forward head   FUNCTIONAL OUTCOME MEASURES: Fastening/unfastening 3 buttons: 52.41 Physical performance test: PPT#2 (simulated eating) 10.82 & PPT#4 (donning/doffing jacket): 30 secs  COORDINATION: 9 Hole Peg test: Right: 29.90 sec; Left: 49.28 sec Box and Blocks:  Right 40blocks, Left 29 blocks   UE ROM:  WFL  UE MMT:   WFL  SENSATION: Light touch: WFL  Vision: Pt is unable to track inferiorly which is consistent with PSP, difficulty tracking superiorly  COGNITION: Overall cognitive status: Impaired, short term memory deficits, slowed processing  OBSERVATIONS: Bradykinesia   TODAY'S TREATMENT:                                                           Discussed ADL strategies for fall prevention and self care/ADLS - see pt instructions for details.  Pt practiced sit to stand several times w/ cues to shift weight more forward over feet as pt places too much weight on heels of feet and goes backwards  Practiced opening/closing twist lids - pt required multiple reps and cues to perform for max benefit and most efficient way of doing  Practiced cutting food w/ shelf liner under plate and cues for correct positioning Lt hand on fork (Rt hand placement looked good)   PATIENT EDUCATION: See above - handout provided  HOME EXERCISE  PROGRAM:    GOALS: Potential goals discussed with pt/ wife  SHORT TERM GOALS: Target date:10/18/22     I with HEP. Baseline: Goal status: INITIAL  2.  Pt will verbalize understanding of adapted strategies to maximize safety and I with ADLs/ IADLs  including strategies to reduce falls.. Baseline:  Goal status: IN PROGRESS  3.  Pt will demonstrate improved LUE functional use as evidenced by increasing box/ blocks score by 3 blocks Baseline:  Goal status: INITIAL  4.  Pt will demonstrate understanding of memory compensations and ways to keep thinking skills sharp Baseline:  Goal status: IN PROGRESS  5. Pt will write a his name with 100% legibility and minimal decrease in letter size Baseline:  Goal status: INITIAL  6. Pt/ wife will verbalize understanding of compensatory strategies for visual deficits.  Goal status: inital  LONG TERM GOALS: Target date:12/13/22   Pt will verbalize understanding of ways to prevent future  complications and PD community resources. Baseline:  Goal status: INITIAL  2.  Pt will demonstrate improved fine motor coordination for ADLs as evidenced  by decreasing 9 hole peg test score for LUE by 3 secs Baseline:  Goal status: INITIAL  3.  Pt will demonstrate increased ease with dressing as eveidenced by decreasing PPT#4(don/ doff jacket) to 25 secs or less Baseline:  Goal status: INITIAL  4.  Pt will demonstrate improved ease with fastening buttons as evidenced by decreasing 3 button/ unbutton time to :48 secs or less  Baseline:  Goal status: INITIAL   ASSESSMENT:  CLINICAL IMPRESSION:Pt' progress is  limited by cognitive deficits including memory and decreased awareness.  PERFORMANCE DEFICITS: in functional skills including ADLs, IADLs, coordination, dexterity, flexibility, Fine motor control, Gross motor control, mobility, balance, decreased knowledge of precautions, decreased knowledge of use of DME, vision, and UE functional use,  cognitive skills including attention, energy/drive, learn, memory, perception, problem solving, safety awareness, thought, and understand, and psychosocial skills including coping strategies, environmental adaptation, habits, interpersonal interactions, and routines and behaviors.   IMPAIRMENTS: are limiting patient from ADLs, IADLs, play, leisure, and social participation.   COMORBIDITIES:  may have co-morbidities  that affects occupational performance. Patient will benefit from skilled OT to address above impairments and improve overall function.  MODIFICATION OR ASSISTANCE TO COMPLETE EVALUATION: Min-Moderate modification of tasks or assist with assess necessary to complete an evaluation.  OT OCCUPATIONAL PROFILE AND HISTORY: Detailed assessment: Review of records and additional review of physical, cognitive, psychosocial history related to current functional performance.  CLINICAL DECISION MAKING: Moderate - several treatment options, min-mod task modification necessary  REHAB POTENTIAL: Good  EVALUATION COMPLEXITY: Moderate    PLAN:  OT FREQUENCY: 2x/week  OT DURATION: 12 weeks  PLANNED INTERVENTIONS: self care/ADL training, therapeutic exercise, therapeutic activity, neuromuscular re-education, manual therapy, passive range of motion, balance training, functional mobility training, aquatic therapy, splinting, ultrasound, paraffin, fluidotherapy, moist heat, cryotherapy, contrast bath, patient/family education, cognitive remediation/compensation, visual/perceptual remediation/compensation, energy conservation, coping strategies training, DME and/or AE instructions, and Re-evaluation  RECOMMENDED OTHER SERVICES: n/a  CONSULTED AND AGREED WITH PLAN OF CARE: Patient  PLAN FOR NEXT SESSION: reinforce ADL strategies, compensation for visual deficits- pt unable to track downward, handwriting   Redmond Baseman, OTR/L 10/22/22 1:25 PM Phone [phone] FAX [phone]

## 2022-10-24 ENCOUNTER — Encounter: Payer: PPO | Admitting: Speech Pathology

## 2022-10-28 DIAGNOSIS — F341 Dysthymic disorder: Secondary | ICD-10-CM | POA: Diagnosis not present

## 2022-10-29 ENCOUNTER — Ambulatory Visit: Payer: PPO | Admitting: Occupational Therapy

## 2022-10-29 ENCOUNTER — Ambulatory Visit: Payer: PPO | Admitting: Speech Pathology

## 2022-10-29 DIAGNOSIS — R1310 Dysphagia, unspecified: Secondary | ICD-10-CM

## 2022-10-29 DIAGNOSIS — R4189 Other symptoms and signs involving cognitive functions and awareness: Secondary | ICD-10-CM

## 2022-10-29 DIAGNOSIS — R2681 Unsteadiness on feet: Secondary | ICD-10-CM

## 2022-10-29 DIAGNOSIS — R29818 Other symptoms and signs involving the nervous system: Secondary | ICD-10-CM

## 2022-10-29 DIAGNOSIS — R293 Abnormal posture: Secondary | ICD-10-CM

## 2022-10-29 DIAGNOSIS — R278 Other lack of coordination: Secondary | ICD-10-CM

## 2022-10-29 DIAGNOSIS — M6281 Muscle weakness (generalized): Secondary | ICD-10-CM

## 2022-10-29 DIAGNOSIS — R2689 Other abnormalities of gait and mobility: Secondary | ICD-10-CM

## 2022-10-29 NOTE — Patient Instructions (Signed)
[employer] Payment Information Account Number    Website    Login    Password    Due date     PASSWORDS  account  password

## 2022-10-29 NOTE — Therapy (Unsigned)
OUTPATIENT OCCUPATIONAL THERAPY [facility] Treatment  Patient Name: Joel Webb MRN: [id number] DOB:1946/06/14, 76 y.o., male Today's Date: 10/29/2022  PCP: Dr. Moreen Fowler REFERRING PROVIDER: Dr. Leta Baptist   OT End of Session - 10/29/22 1501     Visit Number 4    Number of Visits 25    Date for OT Re-Evaluation 12/13/22    Authorization Type [employer]    OT Start Time 1447    OT Stop Time 1530    OT Time Calculation (min) 43 min    Activity Tolerance Patient tolerated treatment well    Behavior During Therapy [facility] for tasks assessed/performed;Impulsive              Past Medical History:  Diagnosis Date   Chronic kidney disease, stage 3 01/11/2021   ED (erectile dysfunction)    Essential hypertension 06/05/2017   Frequent falls    Gait abnormality    Gastroesophageal reflux disease 01/11/2021   Major depressive disorder    Microalbuminuria due to type 2 diabetes mellitus 05/25/2020   Mild cognitive impairment of uncertain or unknown etiology    Mixed hyperlipidemia 02/28/2016   Multiple lacunar infarcts    Plantar fasciitis    Prostatitis    Pure hypercholesterolemia 01/11/2021   PVC's (premature ventricular contractions) 01/28/2022   Uncontrolled type 2 diabetes mellitus with hyperglycemia, with long-term current use of insulin 02/28/2016   Past Surgical History:  Procedure Laterality Date   CIRCUMCISION     COLONOSCOPY     MOUTH SURGERY  01/2020   Patient Active Problem List   Diagnosis Date Noted   Multiple lacunar infarcts    Frequent falls    Gait abnormality    Major depressive disorder 05/30/2022   Mild cognitive impairment of uncertain or unknown etiology    PVC's (premature ventricular contractions) 01/28/2022   Pure hypercholesterolemia 01/11/2021   Gastroesophageal reflux disease 01/11/2021   Chronic kidney disease, stage 3 01/11/2021   Microalbuminuria due to type 2 diabetes mellitus 05/25/2020   Essential hypertension  06/05/2017   Uncontrolled type 2 diabetes mellitus with hyperglycemia, with long-term current use of insulin ([place]) 02/28/2016   Mixed hyperlipidemia 02/28/2016    ONSET DATE: referral 08/15/22  REFERRING DIAG: Parkinsonism  THERAPY DIAG:  Cognitive deficits  Unsteadiness on feet  Other symptoms and signs involving the nervous system  Other lack of coordination  Abnormal posture  Muscle weakness (generalized)  Other abnormalities of gait and mobility  Rationale for Evaluation and Treatment: Rehabilitation  SUBJECTIVE:   SUBJECTIVE STATEMENT: No falls. Wife reports he scraped his Rt arm today but normally falls or stumbles more to the Lt or backwards Pt accompanied by: significant other  PERTINENT HISTORY: diabetes, hypercholesterolemia, high blood pressure, Parkinsonism possible PSP, hx of CVA , visual deficits L side   02/23/22 MRI brain 1. No reversible cause for symptoms. 2. Stable cortical and cerebellar volume loss. Somewhat prominent midbrain thinning, are there symptoms of supranuclear palsy? 3. Interval lacunar infarct in the right putamen, but overall mild chronic small vessel ischemia for age.   03/04/22 PET brain  - No significant loss of cortical metabolism to suggest Alzheimer's type dementia. No evidence of frontotemporal dementia   07/25/22 DATscan Significant bilateral decreased radiotracer activity within LEFT and RIGHT striatum. Deficit greater on the RIGHT. Findings suggestive of Parkinson's syndrome pathologies.    PRECAUTIONS: Fall   WEIGHT BEARING RESTRICTIONS: No  PAIN:  Are you having pain? No  FALLS: Has patient fallen in last 6 months? Yes. Number  of falls multiple  LIVING ENVIRONMENT: Lives with: lives with their spouse Lives in: House/apartment Stairs: yes, has stair lift Has following equipment at home: stair lift , walkin shower  PLOF: Independent  PATIENT GOALS: stop losing balance, play golf  OBJECTIVE:   HAND DOMINANCE:  Right  ADLs: Overall ADLs: Pt is performing basic ADLs mod I however he has recent hx of falls Transfers/ambulation related to ADLs: Eating: difficulty with swallowing  Grooming: mod I UB Dressing: mod I LB Dressing: mod I Toileting: mod I Bathing: mod I Tub Shower transfers: mod I Equipment: Grab bars and Walk in shower  IADLs: Shopping: grocery shops  with wife dropping him off Light housekeeping: washes dishes, does own laundry  Meal Prep: cooks occasionally  Community mobility: mod I with rollator Medication management: needs assist due to cognition, however pt is filling up pillbox Financial management: wife handles Handwriting:  NT  MOBILITY STATUS: Hx of falls  POSTURE COMMENTS:  rounded shoulders and forward head   FUNCTIONAL OUTCOME MEASURES: Fastening/unfastening 3 buttons: 52.41 Physical performance test: PPT#2 (simulated eating) 10.82 & PPT#4 (donning/doffing jacket): 30 secs  COORDINATION: 9 Hole Peg test: Right: 29.90 sec; Left: 49.28 sec Box and Blocks:  Right 40blocks, Left 29 blocks   UE ROM:  WFL  UE MMT:   WFL  SENSATION: Light touch: WFL  Vision: Pt is unable to track inferiorly which is consistent with PSP, difficulty tracking superiorly  COGNITION: Overall cognitive status: Impaired, short term memory deficits, slowed processing  OBSERVATIONS: Bradykinesia   TODAY'S TREATMENT:                                                           Pt had a fall out of a rolling chair. Therapist recommends pt does not use a rolling chair in the future due to increased fall risk. Education regarding sit to stand and stand to sit with chair with arms and without arms, mod v.c and multiple repetitions. Cueing pt to Slow down, for all movements, scoot to edge of chair then really lean forward to stand. Pt was cues to back all the way up,  to reach back for chair and to bow with sitting. Pt's wife was present and is able to cue patient. Pt practiced simulated  eating, scooping food and resting spoon in between bites as pt eats too fast, min-mod v.c . Education regarding handwriting strategies, handout provided. Pt practiced writing in cursive and printing with cues for letter size and legibility as well as slowing down. Pt with improved performance following education. PATIENT EDUCATION: Education details: sit- stand from chair, eating and handwriting with adapted strategies. Person educated: Patient and Spouse Education method: Explanation, Demonstration, Verbal cues, and Handouts Education comprehension: verbalized understanding, returned demonstration, and verbal cues required   HOME EXERCISE PROGRAM:    GOALS: Potential goals discussed with pt/ wife  SHORT TERM GOALS: Target date:11/07/22- extended due to scheduling     I with HEP. Baseline: Goal status: INITIAL  2.  Pt will verbalize understanding of adapted strategies to maximize safety and I with ADLs/ IADLs  including strategies to reduce falls.. Baseline:  Goal status: IN PROGRESS  3.  Pt will demonstrate improved LUE functional use as evidenced by increasing box/ blocks score by 3 blocks Baseline:  Goal status: INITIAL  4.  Pt will demonstrate understanding of memory compensations and ways to keep thinking skills sharp Baseline:  Goal status: IN PROGRESS  5. Pt will write a his name with 100% legibility and minimal decrease in letter size Baseline:  Goal status: INITIAL  6. Pt/ wife will verbalize understanding of compensatory strategies for visual deficits.  Goal status: inital  LONG TERM GOALS: Target date:12/13/22   Pt will verbalize understanding of ways to prevent future  complications and PD community resources. Baseline:  Goal status: INITIAL  2.  Pt will demonstrate improved fine motor coordination for ADLs as evidenced by decreasing 9 hole peg test score for LUE by 3 secs Baseline:  Goal status: INITIAL  3.  Pt will demonstrate increased ease with  dressing as eveidenced by decreasing PPT#4(don/ doff jacket) to 25 secs or less Baseline:  Goal status: INITIAL  4.  Pt will demonstrate improved ease with fastening buttons as evidenced by decreasing 3 button/ unbutton time to :48 secs or less  Baseline:  Goal status: INITIAL   ASSESSMENT:  CLINICAL IMPRESSION:Pt is progressing towards goals. He benefits from repetition and v.c to slow down.  PERFORMANCE DEFICITS: in functional skills including ADLs, IADLs, coordination, dexterity, flexibility, Fine motor control, Gross motor control, mobility, balance, decreased knowledge of precautions, decreased knowledge of use of DME, vision, and UE functional use, cognitive skills including attention, energy/drive, learn, memory, perception, problem solving, safety awareness, thought, and understand, and psychosocial skills including coping strategies, environmental adaptation, habits, interpersonal interactions, and routines and behaviors.   IMPAIRMENTS: are limiting patient from ADLs, IADLs, play, leisure, and social participation.   COMORBIDITIES:  may have co-morbidities  that affects occupational performance. Patient will benefit from skilled OT to address above impairments and improve overall function.  MODIFICATION OR ASSISTANCE TO COMPLETE EVALUATION: Min-Moderate modification of tasks or assist with assess necessary to complete an evaluation.  OT OCCUPATIONAL PROFILE AND HISTORY: Detailed assessment: Review of records and additional review of physical, cognitive, psychosocial history related to current functional performance.  CLINICAL DECISION MAKING: Moderate - several treatment options, min-mod task modification necessary  REHAB POTENTIAL: Good  EVALUATION COMPLEXITY: Moderate    PLAN:  OT FREQUENCY: 2x/week  OT DURATION: 12 weeks  PLANNED INTERVENTIONS: self care/ADL training, therapeutic exercise, therapeutic activity, neuromuscular re-education, manual therapy, passive range  of motion, balance training, functional mobility training, aquatic therapy, splinting, ultrasound, paraffin, fluidotherapy, moist heat, cryotherapy, contrast bath, patient/family education, cognitive remediation/compensation, visual/perceptual remediation/compensation, energy conservation, coping strategies training, DME and/or AE instructions, and Re-evaluation  RECOMMENDED OTHER SERVICES: n/a  CONSULTED AND AGREED WITH PLAN OF CARE: Patient  PLAN FOR NEXT SESSION: check short term goals, reinforce ADL strategies, compensation for visual deficits- pt unable to track downward,    Theone Murdoch, OTR/L [phone] Phone: [phone] 7:47 AM 10/30/22  10/29/22 3:06 PM Phone [phone] FAX [phone]

## 2022-10-29 NOTE — Therapy (Signed)
OUTPATIENT SPEECH LANGUAGE PATHOLOGY TREATMENT NOTE   Patient Name: Joel Webb MRN: [id number] DOB:1946-08-14, 76 y.o., male Today's Date: 10/29/2022  PCP: Antony Contras REFERRING PROVIDER: Andrey Spearman   End of Session - 10/29/22 1405     Visit Number 8    Number of Visits 16    Date for SLP Re-Evaluation 10/29/22    Authorization Type [employer]    SLP Start Time 1405    SLP Stop Time  4431    SLP Time Calculation (min) 40 min    Activity Tolerance Patient tolerated treatment well               Past Medical History:  Diagnosis Date   Chronic kidney disease, stage 3 01/11/2021   ED (erectile dysfunction)    Essential hypertension 06/05/2017   Frequent falls    Gait abnormality    Gastroesophageal reflux disease 01/11/2021   Major depressive disorder    Microalbuminuria due to type 2 diabetes mellitus 05/25/2020   Mild cognitive impairment of uncertain or unknown etiology    Mixed hyperlipidemia 02/28/2016   Multiple lacunar infarcts    Plantar fasciitis    Prostatitis    Pure hypercholesterolemia 01/11/2021   PVC's (premature ventricular contractions) 01/28/2022   Uncontrolled type 2 diabetes mellitus with hyperglycemia, with long-term current use of insulin 02/28/2016   Past Surgical History:  Procedure Laterality Date   CIRCUMCISION     COLONOSCOPY     MOUTH SURGERY  01/2020   Patient Active Problem List   Diagnosis Date Noted   Multiple lacunar infarcts    Frequent falls    Gait abnormality    Major depressive disorder 05/30/2022   Mild cognitive impairment of uncertain or unknown etiology    PVC's (premature ventricular contractions) 01/28/2022   Pure hypercholesterolemia 01/11/2021   Gastroesophageal reflux disease 01/11/2021   Chronic kidney disease, stage 3 01/11/2021   Microalbuminuria due to type 2 diabetes mellitus 05/25/2020   Essential hypertension 06/05/2017   Uncontrolled type 2 diabetes mellitus with  hyperglycemia, with long-term current use of insulin ([facility]) 02/28/2016   Mixed hyperlipidemia 02/28/2016    ONSET DATE: 09/03/22  REFERRING DIAG: Progressive Supranuclear Palsy  THERAPY DIAG:  Cognitive deficits  Dysphagia, unspecified type  Rationale for Evaluation and Treatment Rehabilitation  SUBJECTIVE:   SUBJECTIVE STATEMENT:  "I'm having trouble with the radio"    PAIN:  Are you having pain? No  SPEECH THERAPY DISCHARGE SUMMARY  Visits from Start of Care: 8  Current functional level related to goals / functional outcomes: Improved ability to use phone as communication device with demonstrated understanding in modification and navigation of device. Device optimized for use. Spouse and patient endorse improved swallow function with implementation of meal time compensations, primarily reduction of distractions, use of intentional swallow, reducing rate, and avoiding mixed consistencies.    Remaining deficits: Dysarthria, cognitive deficits, dysphagia    Education / Equipment: External cognitive aids, phone usage, swallow strategies and compensations, cueing hierarchy, HEP   Patient agrees to discharge. Patient goals were partially met. Patient is being discharged due to meeting the stated rehab goals..     OBJECTIVE:   TODAY'S TREATMENT:  10-29-22: SLP assisted pt in problem solving problems with phone. Given verbal A, pt able to navigate to desired apps, reorder apps on phone, and create new account for a novel app. SLP facilitates discussion regarding generation of supports to maintain independence in financial management, with pt verbalizing agreement and understanding of reasoning behind recommendation. Pt  chooses for SLP to generate a chart he can fill in to aid in recall of bills to pay and passwords to online accounts. Generates x3 accounts he needs to recall passwords for. Today is patient's final ST visit, is agreeable to d/c. Verbalizes understanding via teach  back on indications to return to [facility] with new referral should needs change d/t progression of disorder.   10-22-22: Pt c/o difficulty using phone, which is new to patient in past few months. Primary desires to use phone to communicate with loved ones. SLP aided pt in ensuring selected contacts are in favorites. Practiced navigating through contacts to text and call, with pt able to demonstrate x3. Pt able to open text message and delete with supervision A. SLP A pt in setting daily reminders for blood sugar monitoring and insulin administration. Use of reoccurrence and labeling features to aid in efficacy of alarms.  Demonstrated how to modify lock screen and download text messaged photos. Pt verbalizes understanding. Spouse supporting greater understanding of SLP role in pt's rehabilitation course.   10-17-22: To aid in increased independence in glucose monitoring, to include recall of numbers, SLP A pt in downloading app to phone, and creating account to access. SLP assisted pt in organization of cell phone home screen to aid in ease of use, with putting most important or primarily utilized apps in prominent position.   09-26-22: Spouse questioning need for 2x week ST schedule. SLP educates on role of ST in cognitive rehabilitation. Focus for pt is participation and safety in home based and preferred avocational activities and reduction of caregiver burden. Pt and spouse verbalize understanding, desire to reduce frequency to 1x week at this time. SLP cancelled appointments to accommodate request and provided updated schedule. SLP A pt in generating strategies to maximize likelihood of pt wearing life alert, see handout. Education on benefit of creating routines and habits to aid in recall of mundane but required activities. Pt tells SLP "I think that's helpful. Additional education provided on verbal cueing likely needed from spouse to aid in establishment of habits.   09-24-22: Pt supports SLP administering  MBSS reviewed x-ray slides and provided education. Pt recall swallow strategies with supervision A verbal cues and reports occasional s/s aspiration when consuming thin liquids due to impulsivity. Pt reports not utilizing Alexia for external strategies due to unable to recall strategy, but reports recalling to walk scooter without need for recall strategy. SLP facilitated mildly complex problem solving and recall strategies in calendar task, pt required supervision A verbal cues. In organization reading task based on directions pt required mod A verbal cues to slow reading rate in order to process information and monitor errors. SLP educated pt on strategy of reading aloud to slow rate and allow time for processing, pt suggested reading to grandson aloud. Pt consumed thin liquids via cup, mod I for small sips and no overt s/s aspiration. SLP provided HEP of mildly complex problem solving worksheet with instructions to read aloud and monitor errors.   09-17-22: SLP provides education on upcoming MBSS and what to expect. All pt's and spouse's questions answered to satisfaction. Additional education on considering wishes for future care and decisions re: swallow function as we know dyspahgia is a common complication associated with pt's dx of PSP. Generated memory strategies to aid in recall of evening blood pressure and sugar+insulin administration. Generated use of Alexia as external aid and coupling with walking scooter as internal aid. Pt to trial over next week and report back  success.   09-10-22: Re-education provided on swallow precautions and use of intentional swallow. Recommend limiting distractions at meal times to aid in focus. Education on PSP impact on swallow, to include saliva management. Pt reports to increased drooling and difficulty with "nasal drip." SLP suspects some pharyngeal residue sensation may be 2/2 reduced volitional swallow for saliva management. Recommend strategy of either chewing  gum or intentional swallow prior to speaking. Generated strategy to A in recall of turning off burners (red tape in off position) and checking blood pressure when waking (making part of routine) as solutions to pt reported issues currently affecting successful participation. Will plan to trial errorless learning to aid in visual scanning of stove dials next session.    09-03-22: SLP administered cognitive linguistic assessment CLQT and BSE. Pt and pt's wife report no acute changes in speech and voice. Pt reports difficulty with cognitive skills, word finding and swallow function. SLP recommended MBS to further assess swallow function and continued current diet regular textures and thin liquids with precautions effortful/intental swallow and to separate solids/liquids. SLP and pt created plan and all questions answered to satisfaction.   PATIENT EDUCATION: Education details: swallow strategies and need for MBS to further assess swallow function.  Person educated: Patient and Spouse Education method: Customer service manager Education comprehension: verbalized understanding   HOME EXERCISE PROGRAM: HEP will be created next session due to time restraints.    GOALS: Goals reviewed with patient? Yes  SHORT TERM GOALS: Target date: 10/01/2022    Pt will demonstrate mildly complex problem solving/executive function skills with supervision A verbal cues for 80% accuracy.  Baseline: 09/26/2022 Goal status: MET  2.  Pt will demonstrate use of internal/external recall strategies in functional tasks with min A verbal cues for 80% accuracy. Baseline:  Goal status: NOT MET  3.  Pt will demonstrate sustained and selective attention in functional task for 10-15 minute intervals with supervision A verbal cues for redirection.  Baseline: 09/24/2022 Goal status: MET  4.  Pt will self-monitor and self-correct functional errors with min A verbal cues for 80% accuracy. Baseline:  Goal status: NOT  MET  5.  Pt will demonstrate word finding skills in structured and unstructured tasks with min A verbal cues for 80% accuracy.  Baseline:  Goal status: NOT MET  6.  Pt will participate in instrumental swallow assessment for further assessment of swallow function.  Baseline:  Goal status: MET  LONG TERM GOALS: Target date: 10/29/2022    Pt will demonstrate mildly complex to complex problem solving skills with supervision A verbal cues for 90% accuracy.  Baseline:  Goal status: PARTIALLY MET  2.  Pt will demonstrate use of internal/external recall strategies in functional tasks with supervision A verbal cues for 80% accuracy. Baseline:  Goal status: deferred  3.   Pt will demonstrate sustained and selective attention in functional task for 15-20 minute intervals with supervision A verbal cues for redirection. Baseline:  Goal status: MET  4.    Pt will self-monitor and self-correct functional errors with supervision A verbal cues for 80% accuracy. Baseline:  Goal status: deferred  5.  Pt will demonstrate word finding skills in structured and unstructured tasks with supervision A verbal cues for 80% accuracy.  Baseline:  Goal status: MET  6.  Pt will consume least restrictive diet with supervision A verbal cues for strategies and exercises to improvement swallow function.  Baseline:  Goal status: MET  ASSESSMENT:  CLINICAL IMPRESSION: Therapy course targeted maintaining independence  in desired areas of entertainment, financial management, and home based activities. This session focused on employment of phone as external cognitive aid and communication device. Additional generation of strategy to aid in ongoing independence in paying x1 bill and access to online accounts. Pt and spouse agreeable to ST d/c.   OBJECTIVE IMPAIRMENTS  Objective impairments include attention, memory, awareness, executive functioning, expressive language, and dysphagia. These impairments are limiting  patient from managing appointments, managing finances, household responsibilities, and safety when swallowing.Factors affecting potential to achieve goals and functional outcome are ability to learn/carryover information, co-morbidities, and severity of impairments.. Patient will benefit from skilled SLP services to address above impairments and improve overall function.  REHAB POTENTIAL: Good  PLAN: SLP FREQUENCY: 2x/week  SLP DURATION: 8 weeks  PLANNED INTERVENTIONS: Aspiration precaution training, Pharyngeal strengthening exercises, Diet toleration management , Trials of upgraded texture/liquids, Cueing hierachy, Cognitive reorganization, Internal/external aids, SLP instruction and feedback, Compensatory strategies, and Patient/family education   Su Monks, [place] 10/29/2022, 2:05 PM

## 2022-10-31 ENCOUNTER — Encounter: Payer: PPO | Admitting: Speech Pathology

## 2022-11-05 ENCOUNTER — Other Ambulatory Visit: Payer: Self-pay | Admitting: Endocrinology

## 2022-11-05 ENCOUNTER — Ambulatory Visit: Payer: PPO | Admitting: Occupational Therapy

## 2022-11-05 DIAGNOSIS — M6281 Muscle weakness (generalized): Secondary | ICD-10-CM

## 2022-11-05 DIAGNOSIS — R4189 Other symptoms and signs involving cognitive functions and awareness: Secondary | ICD-10-CM | POA: Diagnosis not present

## 2022-11-05 DIAGNOSIS — R29818 Other symptoms and signs involving the nervous system: Secondary | ICD-10-CM

## 2022-11-05 DIAGNOSIS — R2681 Unsteadiness on feet: Secondary | ICD-10-CM

## 2022-11-05 DIAGNOSIS — R293 Abnormal posture: Secondary | ICD-10-CM

## 2022-11-05 DIAGNOSIS — R278 Other lack of coordination: Secondary | ICD-10-CM

## 2022-11-05 DIAGNOSIS — R2689 Other abnormalities of gait and mobility: Secondary | ICD-10-CM

## 2022-11-05 NOTE — Patient Instructions (Addendum)
        Memory Compensation Strategies  Use "WARM" strategy. W= write it down A=  associate it R=  repeat it M=  make a mental picture  You can keep a Memory Notebook. Use a 3-ring notebook with sections for the following:  calendar, important names and phone numbers, medications, doctors' names/phone numbers, "to do list"/reminders, and a section to journal what you did each day  Use a calendar to write appointments down.  Write yourself a schedule for the day.  This can be placed on the calendar or in a separate section of the Memory Notebook.  Keeping a regular schedule can help memory.  Use medication organizer with sections for each day or morning/evening pills  You may need help loading it  Keep a basket, or pegboard by the door.   Place items that you need to take out with you in the basket or on the pegboard.  You may also want to include a message board for reminders.  Use sticky notes. Place sticky notes with reminders in a place where the task is performed.  For example:  "turn off the stove" placed by the stove, "lock the door" placed on the door at eye level, "take your medications" on the bathroom mirror or by the place where you normally take your medications  Use alarms, timers, and/or a reminder app. Use while cooking to remind yourself to check on food or as a reminder to take your medicine, or as a reminder to make a call, or as a reminder to perform another task, etc.  Use a voice recorder app or small tape recorder to record important information and notes for yourself. Go back at the end of the day and listen to these.    Keeping Thinking Skills Sharp: 1. Jigsaw puzzles 2. Card/board games 3. Talking on the phone/social events 4. [email or web address] 5. Online games 6. Word serches/crossword puzzles 7.  Logic puzzles 8. Aerobic exercise (stationary bike) 9. Eating balanced diet (fruits & veggies) 10. Drink water 11. Try something new--new recipe,  hobby 12. Crafts 13. Do a variety of activities that are challenging 14.  Plan weekly meals and write a grocery list

## 2022-11-05 NOTE — Therapy (Addendum)
OUTPATIENT OCCUPATIONAL THERAPY [facility]  Patient Name: Joel Webb MRN: [id number] DOB:04-23-1946, 76 y.o., male Today's Date: 11/05/2022  PCP: Dr. Moreen Fowler REFERRING PROVIDER: Dr. Leta Baptist   OT End of Session - 11/05/22 1550     Visit Number 5    Number of Visits 25    Date for OT Re-Evaluation 12/13/22    Authorization Type [employer]    OT Start Time 9509    OT Stop Time 1445    OT Time Calculation (min) 42 min    Activity Tolerance Patient tolerated treatment well    Behavior During Therapy [facility] for tasks assessed/performed;Impulsive               Past Medical History:  Diagnosis Date   Chronic kidney disease, stage 3 01/11/2021   ED (erectile dysfunction)    Essential hypertension 06/05/2017   Frequent falls    Gait abnormality    Gastroesophageal reflux disease 01/11/2021   Major depressive disorder    Microalbuminuria due to type 2 diabetes mellitus 05/25/2020   Mild cognitive impairment of uncertain or unknown etiology    Mixed hyperlipidemia 02/28/2016   Multiple lacunar infarcts    Plantar fasciitis    Prostatitis    Pure hypercholesterolemia 01/11/2021   PVC's (premature ventricular contractions) 01/28/2022   Uncontrolled type 2 diabetes mellitus with hyperglycemia, with long-term current use of insulin 02/28/2016   Past Surgical History:  Procedure Laterality Date   CIRCUMCISION     COLONOSCOPY     MOUTH SURGERY  01/2020   Patient Active Problem List   Diagnosis Date Noted   Multiple lacunar infarcts    Frequent falls    Gait abnormality    Major depressive disorder 05/30/2022   Mild cognitive impairment of uncertain or unknown etiology    PVC's (premature ventricular contractions) 01/28/2022   Pure hypercholesterolemia 01/11/2021   Gastroesophageal reflux disease 01/11/2021   Chronic kidney disease, stage 3 01/11/2021   Microalbuminuria due to type 2 diabetes mellitus 05/25/2020   Essential hypertension  06/05/2017   Uncontrolled type 2 diabetes mellitus with hyperglycemia, with long-term current use of insulin ([place]) 02/28/2016   Mixed hyperlipidemia 02/28/2016    ONSET DATE: referral 08/15/22  REFERRING DIAG: Parkinsonism  THERAPY DIAG:  Other symptoms and signs involving the nervous system  Other lack of coordination  Abnormal posture  Muscle weakness (generalized)  Other abnormalities of gait and mobility  Unsteadiness on feet  Rationale for Evaluation and Treatment: Rehabilitation  SUBJECTIVE:   SUBJECTIVE STATEMENT: No falls since last visit Pt accompanied by: significant other  PERTINENT HISTORY: diabetes, hypercholesterolemia, high blood pressure, Parkinsonism possible PSP, hx of CVA , visual deficits L side   02/23/22 MRI brain 1. No reversible cause for symptoms. 2. Stable cortical and cerebellar volume loss. Somewhat prominent midbrain thinning, are there symptoms of supranuclear palsy? 3. Interval lacunar infarct in the right putamen, but overall mild chronic small vessel ischemia for age.   03/04/22 PET brain  - No significant loss of cortical metabolism to suggest Alzheimer's type dementia. No evidence of frontotemporal dementia   07/25/22 DATscan Significant bilateral decreased radiotracer activity within LEFT and RIGHT striatum. Deficit greater on the RIGHT. Findings suggestive of Parkinson's syndrome pathologies.    PRECAUTIONS: Fall   WEIGHT BEARING RESTRICTIONS: No  PAIN:  Are you having pain? No  FALLS: Has patient fallen in last 6 months? Yes. Number of falls multiple  LIVING ENVIRONMENT: Lives with: lives with their spouse Lives in: House/apartment Stairs: yes, has  stair lift Has following equipment at home: stair lift , walkin shower  PLOF: Independent  PATIENT GOALS: stop losing balance, play golf  OBJECTIVE:   HAND DOMINANCE: Right  ADLs: Overall ADLs: Pt is performing basic ADLs mod I however he has recent hx of  falls Transfers/ambulation related to ADLs: Eating: difficulty with swallowing  Grooming: mod I UB Dressing: mod I LB Dressing: mod I Toileting: mod I Bathing: mod I Tub Shower transfers: mod I Equipment: Grab bars and Walk in shower  IADLs: Shopping: grocery shops  with wife dropping him off Light housekeeping: washes dishes, does own laundry  Meal Prep: cooks occasionally  Community mobility: mod I with rollator Medication management: needs assist due to cognition, however pt is filling up pillbox Financial management: wife handles Handwriting:  NT  MOBILITY STATUS: Hx of falls  POSTURE COMMENTS:  rounded shoulders and forward head   FUNCTIONAL OUTCOME MEASURES: Fastening/unfastening 3 buttons: 52.41 Physical performance test: PPT#2 (simulated eating) 10.82 & PPT#4 (donning/doffing jacket): 30 secs  COORDINATION: 9 Hole Peg test: Right: 29.90 sec; Left: 49.28 sec Box and Blocks:  Right 40blocks, Left 29 blocks   UE ROM:  WFL  UE MMT:   WFL  SENSATION: Light touch: WFL  Vision: Pt is unable to track inferiorly which is consistent with PSP, difficulty tracking superiorly  COGNITION: Overall cognitive status: Impaired, short term memory deficits, slowed processing  OBSERVATIONS: Bradykinesia   TODAY'S TREATMENT:   11/05/22-Education regarding memory compensations, keeping thinking skills sharp.    Pt practiced sit to stand and stand to sit with adapted strategies. PWR! Up in seated min v.c Flipping and dealing playing cards with big movements, min-mod v.c                                                   PATIENT EDUCATION: Education details: sit- stand from chair, Education regarding memory compensations, keeping thinking skills sharp. Education performed regarding visual compensations due to inability to track in inferior visual field. Person educated: Patient and Spouse Education method: Explanation, Demonstration, Verbal cues, handout issued Education  comprehension: verbalized understanding, returned demonstration, and verbal cues required   HOME EXERCISE PROGRAM: Coordination HEP 11/05/22-Memory compensations, keeping thinking skills sharp    GOALS: Potential goals discussed with pt/ wife  SHORT TERM GOALS: Target date:11/07/22- extended due to scheduling     I with HEP. Baseline: Goal status:  ongoing, needs reinforcement  2.  Pt will verbalize understanding of adapted strategies to maximize safety and I with ADLs/ IADLs  including strategies to reduce falls.. Baseline:  Goal status: IN PROGRESS  3.  Pt will demonstrate improved LUE functional use as evidenced by increasing box/ blocks score by 3 blocks Baseline: LUE 29 blocks Goal status:met 36 blocks 11/05/22  4.  Pt will demonstrate understanding of memory compensations and ways to keep thinking skills sharp Baseline:  Goal status: IN PROGRESS  5. Pt will write a his name with 100% legibility and minimal decrease in letter size Baseline:  Goal status: ongoing  6. Pt/ wife will verbalize understanding of compensatory strategies for visual deficits.  Goal status: education initiated, needs reinforcement  LONG TERM GOALS: Target date:12/13/22   Pt will verbalize understanding of ways to prevent future  complications and PD community resources. Baseline:  Goal status: INITIAL  2.  Pt will demonstrate improved fine motor  coordination for ADLs as evidenced by decreasing 9 hole peg test score for LUE by 3 secs Baseline:  Goal status: INITIAL  3.  Pt will demonstrate increased ease with dressing as eveidenced by decreasing PPT#4(don/ doff jacket) to 25 secs or less Baseline:  Goal status: INITIAL  4.  Pt will demonstrate improved ease with fastening buttons as evidenced by decreasing 3 button/ unbutton time to :48 secs or less  Baseline:  Goal status: INITIAL   ASSESSMENT:  CLINICAL IMPRESSION:Pt is progressing towards goals.   PERFORMANCE DEFICITS: in  functional skills including ADLs, IADLs, coordination, dexterity, flexibility, Fine motor control, Gross motor control, mobility, balance, decreased knowledge of precautions, decreased knowledge of use of DME, vision, and UE functional use, cognitive skills including attention, energy/drive, learn, memory, perception, problem solving, safety awareness, thought, and understand, and psychosocial skills including coping strategies, environmental adaptation, habits, interpersonal interactions, and routines and behaviors.   IMPAIRMENTS: are limiting patient from ADLs, IADLs, play, leisure, and social participation.   COMORBIDITIES:  may have co-morbidities  that affects occupational performance. Patient will benefit from skilled OT to address above impairments and improve overall function.  MODIFICATION OR ASSISTANCE TO COMPLETE EVALUATION: Min-Moderate modification of tasks or assist with assess necessary to complete an evaluation.  OT OCCUPATIONAL PROFILE AND HISTORY: Detailed assessment: Review of records and additional review of physical, cognitive, psychosocial history related to current functional performance.  CLINICAL DECISION MAKING: Moderate - several treatment options, min-mod task modification necessary  REHAB POTENTIAL: Good  EVALUATION COMPLEXITY: Moderate    PLAN:  OT FREQUENCY: 2x/week  OT DURATION: 12 weeks  PLANNED INTERVENTIONS: self care/ADL training, therapeutic exercise, therapeutic activity, neuromuscular re-education, manual therapy, passive range of motion, balance training, functional mobility training, aquatic therapy, splinting, ultrasound, paraffin, fluidotherapy, moist heat, cryotherapy, contrast bath, patient/family education, cognitive remediation/compensation, visual/perceptual remediation/compensation, energy conservation, coping strategies training, DME and/or AE instructions, and Re-evaluation  RECOMMENDED OTHER SERVICES: n/a  CONSULTED AND AGREED WITH PLAN  OF CARE: Patient  PLAN FOR NEXT SESSION: continue to check short term goals, Handwriting, reinforce ADL strategies, reinforce compensation for visual deficits- pt unable to track downward,    Theone Murdoch, OTR/L [phone] Phone: [phone] 3:52 PM 11/05/22  11/05/22 3:52 PM Phone [phone] FAX [phone]

## 2022-11-19 ENCOUNTER — Encounter: Payer: Self-pay | Admitting: Occupational Therapy

## 2022-11-19 ENCOUNTER — Ambulatory Visit: Payer: PPO | Attending: Diagnostic Neuroimaging | Admitting: Occupational Therapy

## 2022-11-19 DIAGNOSIS — M6281 Muscle weakness (generalized): Secondary | ICD-10-CM | POA: Insufficient documentation

## 2022-11-19 DIAGNOSIS — R2681 Unsteadiness on feet: Secondary | ICD-10-CM | POA: Insufficient documentation

## 2022-11-19 DIAGNOSIS — R293 Abnormal posture: Secondary | ICD-10-CM | POA: Insufficient documentation

## 2022-11-19 DIAGNOSIS — R4189 Other symptoms and signs involving cognitive functions and awareness: Secondary | ICD-10-CM | POA: Diagnosis not present

## 2022-11-19 DIAGNOSIS — R29818 Other symptoms and signs involving the nervous system: Secondary | ICD-10-CM | POA: Diagnosis not present

## 2022-11-19 DIAGNOSIS — R2689 Other abnormalities of gait and mobility: Secondary | ICD-10-CM | POA: Insufficient documentation

## 2022-11-19 DIAGNOSIS — R278 Other lack of coordination: Secondary | ICD-10-CM | POA: Diagnosis not present

## 2022-11-19 NOTE — Therapy (Addendum)
OUTPATIENT OCCUPATIONAL THERAPY [facility] Treatment  Patient Name: Joel Webb MRN: [id number] DOB:Jul 09, 1946, 77 y.o., male Today's Date: 11/19/2022  PCP: Dr. Moreen Fowler REFERRING PROVIDER: Dr. Leta Baptist   OT End of Session - 11/19/22 1322     Visit Number 6    Number of Visits 25    Date for OT Re-Evaluation 12/13/22    Authorization Type [employer]    OT Start Time 1320    OT Stop Time 1400    OT Time Calculation (min) 40 min    Activity Tolerance Patient tolerated treatment well    Behavior During Therapy [facility] for tasks assessed/performed;Impulsive               Past Medical History:  Diagnosis Date   Chronic kidney disease, stage 3 01/11/2021   ED (erectile dysfunction)    Essential hypertension 06/05/2017   Frequent falls    Gait abnormality    Gastroesophageal reflux disease 01/11/2021   Major depressive disorder    Microalbuminuria due to type 2 diabetes mellitus 05/25/2020   Mild cognitive impairment of uncertain or unknown etiology    Mixed hyperlipidemia 02/28/2016   Multiple lacunar infarcts    Plantar fasciitis    Prostatitis    Pure hypercholesterolemia 01/11/2021   PVC's (premature ventricular contractions) 01/28/2022   Uncontrolled type 2 diabetes mellitus with hyperglycemia, with long-term current use of insulin 02/28/2016   Past Surgical History:  Procedure Laterality Date   CIRCUMCISION     COLONOSCOPY     MOUTH SURGERY  01/2020   Patient Active Problem List   Diagnosis Date Noted   Multiple lacunar infarcts    Frequent falls    Gait abnormality    Major depressive disorder 05/30/2022   Mild cognitive impairment of uncertain or unknown etiology    PVC's (premature ventricular contractions) 01/28/2022   Pure hypercholesterolemia 01/11/2021   Gastroesophageal reflux disease 01/11/2021   Chronic kidney disease, stage 3 01/11/2021   Microalbuminuria due to type 2 diabetes mellitus 05/25/2020   Essential hypertension  06/05/2017   Uncontrolled type 2 diabetes mellitus with hyperglycemia, with long-term current use of insulin ([place]) 02/28/2016   Mixed hyperlipidemia 02/28/2016    ONSET DATE: referral 08/15/22  REFERRING DIAG: Parkinsonism  THERAPY DIAG:  Other symptoms and signs involving the nervous system  Other lack of coordination  Unsteadiness on feet  Cognitive deficits  Rationale for Evaluation and Treatment: Rehabilitation  SUBJECTIVE:   SUBJECTIVE STATEMENT: I fell the day after Christmas. I think I fell backwards but caught myself Pt accompanied by: significant other  PERTINENT HISTORY: diabetes, hypercholesterolemia, high blood pressure, Parkinsonism possible PSP, hx of CVA , visual deficits L side   02/23/22 MRI brain 1. No reversible cause for symptoms. 2. Stable cortical and cerebellar volume loss. Somewhat prominent midbrain thinning, are there symptoms of supranuclear palsy? 3. Interval lacunar infarct in the right putamen, but overall mild chronic small vessel ischemia for age.   03/04/22 PET brain  - No significant loss of cortical metabolism to suggest Alzheimer's type dementia. No evidence of frontotemporal dementia   07/25/22 DATscan Significant bilateral decreased radiotracer activity within LEFT and RIGHT striatum. Deficit greater on the RIGHT. Findings suggestive of Parkinson's syndrome pathologies.    PRECAUTIONS: Fall   WEIGHT BEARING RESTRICTIONS: No  PAIN:  Are you having pain? No  FALLS: Has patient fallen in last 6 months? Yes. Number of falls multiple  LIVING ENVIRONMENT: Lives with: lives with their spouse Lives in: House/apartment Stairs: yes, has stair lift  Has following equipment at home: stair lift , walkin shower  PLOF: Independent  PATIENT GOALS: stop losing balance, play golf  OBJECTIVE:   HAND DOMINANCE: Right  ADLs: Overall ADLs: Pt is performing basic ADLs mod I however he has recent hx of falls Transfers/ambulation related to  ADLs: Eating: difficulty with swallowing  Grooming: mod I UB Dressing: mod I LB Dressing: mod I Toileting: mod I Bathing: mod I Tub Shower transfers: mod I Equipment: Grab bars and Walk in shower  IADLs: Shopping: grocery shops  with wife dropping him off Light housekeeping: washes dishes, does own laundry  Meal Prep: cooks occasionally  Community mobility: mod I with rollator Medication management: needs assist due to cognition, however pt is filling up pillbox Financial management: wife handles Handwriting:  NT  MOBILITY STATUS: Hx of falls  POSTURE COMMENTS:  rounded shoulders and forward head   FUNCTIONAL OUTCOME MEASURES: Fastening/unfastening 3 buttons: 52.41 Physical performance test: PPT#2 (simulated eating) 10.82 & PPT#4 (donning/doffing jacket): 30 secs  COORDINATION: 9 Hole Peg test: Right: 29.90 sec; Left: 49.28 sec Box and Blocks:  Right 40blocks, Left 29 blocks   UE ROM:  WFL  UE MMT:   WFL  SENSATION: Light touch: WFL  Vision: Pt is unable to track inferiorly which is consistent with PSP, difficulty tracking superiorly  COGNITION: Overall cognitive status: Impaired, short term memory deficits, slowed processing  OBSERVATIONS: Bradykinesia   TODAY'S TREATMENT:    Reviewed memory strategies and ways to keep thinking skills sharp.  Reviewed scanning and tilting head down when negotiating steps, stepping off curbs or with uneven terrain d/t decreased eye movement in inferior quadrants.   Practiced handwriting - wrote 3 sentences x 2 with cues to slow down, aim big and slide forearm along page, and keep big at end of sentence. Pt did better second time.  Practiced writing name x 3 maintaining size and legibility  Discussed safety for fall prevention w/ dynamic standing, transfers, and walking to bathroom. Recommended sitting up slowly, then standing up slowly and waiting 2-3 sec before walking. Also reinforced wide staggered stance with dynamic  standing. Reinforced sitting for donning/doffing pants over feet, bathing, and donning/doffing pullover shirt. Practiced standing to hook/unhook belt w/ wider stance    PATIENT EDUCATION: Education details: sit- stand from chair, Education regarding memory compensations, keeping thinking skills sharp. Education performed regarding visual compensations due to inability to track in inferior visual field. Person educated: Patient and Spouse Education method: Explanation, Demonstration, Verbal cues, handout issued Education comprehension: verbalized understanding, returned demonstration, and verbal cues required   HOME EXERCISE PROGRAM: Coordination HEP 11/05/22-Memory compensations, keeping thinking skills sharp    GOALS: Potential goals discussed with pt/ wife  SHORT TERM GOALS: Target date:11/07/22- extended due to scheduling     I with HEP. Baseline: Goal status:  ongoing, needs reinforcement  2.  Pt will verbalize understanding of adapted strategies to maximize safety and I with ADLs/ IADLs  including strategies to reduce falls.. Baseline:  Goal status: IN PROGRESS  3.  Pt will demonstrate improved LUE functional use as evidenced by increasing box/ blocks score by 3 blocks Baseline: LUE 29 blocks Goal status:met 36 blocks 11/05/22  4.  Pt will demonstrate understanding of memory compensations and ways to keep thinking skills sharp Baseline:  Goal status: met  5. Pt will write a his name with 100% legibility and minimal decrease in letter size Baseline:  Goal status: met  6. Pt/ wife will verbalize understanding of compensatory strategies for  visual deficits.  Goal status: education initiated, needs reinforcement  LONG TERM GOALS: Target date:12/13/22   Pt will verbalize understanding of ways to prevent future  complications and PD community resources. Baseline:  Goal status: INITIAL  2.  Pt will demonstrate improved fine motor coordination for ADLs as evidenced by  decreasing 9 hole peg test score for LUE by 3 secs Baseline:  Goal status: INITIAL  3.  Pt will demonstrate increased ease with dressing as eveidenced by decreasing PPT#4(don/ doff jacket) to 25 secs or less Baseline:  Goal status: INITIAL  4.  Pt will demonstrate improved ease with fastening buttons as evidenced by decreasing 3 button/ unbutton time to :48 secs or less  Baseline:  Goal status: INITIAL   ASSESSMENT:  CLINICAL IMPRESSION:Pt is progressing towards goals. Limited by memory/cognitive deficits  PERFORMANCE DEFICITS: in functional skills including ADLs, IADLs, coordination, dexterity, flexibility, Fine motor control, Gross motor control, mobility, balance, decreased knowledge of precautions, decreased knowledge of use of DME, vision, and UE functional use, cognitive skills including attention, energy/drive, learn, memory, perception, problem solving, safety awareness, thought, and understand, and psychosocial skills including coping strategies, environmental adaptation, habits, interpersonal interactions, and routines and behaviors.   IMPAIRMENTS: are limiting patient from ADLs, IADLs, play, leisure, and social participation.   COMORBIDITIES:  may have co-morbidities  that affects occupational performance. Patient will benefit from skilled OT to address above impairments and improve overall function.  MODIFICATION OR ASSISTANCE TO COMPLETE EVALUATION: Min-Moderate modification of tasks or assist with assess necessary to complete an evaluation.  OT OCCUPATIONAL PROFILE AND HISTORY: Detailed assessment: Review of records and additional review of physical, cognitive, psychosocial history related to current functional performance.  CLINICAL DECISION MAKING: Moderate - several treatment options, min-mod task modification necessary  REHAB POTENTIAL: Good  EVALUATION COMPLEXITY: Moderate    PLAN:  OT FREQUENCY: 2x/week  OT DURATION: 12 weeks  PLANNED INTERVENTIONS: self  care/ADL training, therapeutic exercise, therapeutic activity, neuromuscular re-education, manual therapy, passive range of motion, balance training, functional mobility training, aquatic therapy, splinting, ultrasound, paraffin, fluidotherapy, moist heat, cryotherapy, contrast bath, patient/family education, cognitive remediation/compensation, visual/perceptual remediation/compensation, energy conservation, coping strategies training, DME and/or AE instructions, and Re-evaluation  RECOMMENDED OTHER SERVICES: n/a  CONSULTED AND AGREED WITH PLAN OF CARE: Patient  PLAN FOR NEXT SESSION: work towards remaining goals  Redmond Baseman, OTR/L 11/19/22 1:23 PM Phone [phone] FAX [phone]

## 2022-11-21 ENCOUNTER — Ambulatory Visit: Payer: PPO | Admitting: Occupational Therapy

## 2022-11-21 DIAGNOSIS — R4189 Other symptoms and signs involving cognitive functions and awareness: Secondary | ICD-10-CM

## 2022-11-21 DIAGNOSIS — R293 Abnormal posture: Secondary | ICD-10-CM

## 2022-11-21 DIAGNOSIS — R2689 Other abnormalities of gait and mobility: Secondary | ICD-10-CM

## 2022-11-21 DIAGNOSIS — R29818 Other symptoms and signs involving the nervous system: Secondary | ICD-10-CM

## 2022-11-21 DIAGNOSIS — R278 Other lack of coordination: Secondary | ICD-10-CM

## 2022-11-21 DIAGNOSIS — M6281 Muscle weakness (generalized): Secondary | ICD-10-CM

## 2022-11-21 DIAGNOSIS — R2681 Unsteadiness on feet: Secondary | ICD-10-CM

## 2022-11-21 NOTE — Therapy (Signed)
OUTPATIENT OCCUPATIONAL THERAPY [facility]  Patient Name: Joel Webb MRN: [id number] DOB:1946-10-11, 77 y.o., male Today's Date: 11/21/2022  PCP: Dr. Moreen Fowler REFERRING PROVIDER: Dr. Leta Baptist   OT End of Session - 11/21/22 1403     Visit Number 7    Number of Visits 25    Date for OT Re-Evaluation 12/13/22    Authorization Type [employer]    OT Start Time 2831    OT Stop Time 5176    OT Time Calculation (min) 42 min                Past Medical History:  Diagnosis Date   Chronic kidney disease, stage 3 01/11/2021   ED (erectile dysfunction)    Essential hypertension 06/05/2017   Frequent falls    Gait abnormality    Gastroesophageal reflux disease 01/11/2021   Major depressive disorder    Microalbuminuria due to type 2 diabetes mellitus 05/25/2020   Mild cognitive impairment of uncertain or unknown etiology    Mixed hyperlipidemia 02/28/2016   Multiple lacunar infarcts    Plantar fasciitis    Prostatitis    Pure hypercholesterolemia 01/11/2021   PVC's (premature ventricular contractions) 01/28/2022   Uncontrolled type 2 diabetes mellitus with hyperglycemia, with long-term current use of insulin 02/28/2016   Past Surgical History:  Procedure Laterality Date   CIRCUMCISION     COLONOSCOPY     MOUTH SURGERY  01/2020   Patient Active Problem List   Diagnosis Date Noted   Multiple lacunar infarcts    Frequent falls    Gait abnormality    Major depressive disorder 05/30/2022   Mild cognitive impairment of uncertain or unknown etiology    PVC's (premature ventricular contractions) 01/28/2022   Pure hypercholesterolemia 01/11/2021   Gastroesophageal reflux disease 01/11/2021   Chronic kidney disease, stage 3 01/11/2021   Microalbuminuria due to type 2 diabetes mellitus 05/25/2020   Essential hypertension 06/05/2017   Uncontrolled type 2 diabetes mellitus with hyperglycemia, with long-term current use of insulin ([place]) 02/28/2016    Mixed hyperlipidemia 02/28/2016    ONSET DATE: referral 08/15/22  REFERRING DIAG: Parkinsonism  THERAPY DIAG:  Other symptoms and signs involving the nervous system  Other lack of coordination  Unsteadiness on feet  Cognitive deficits  Abnormal posture  Muscle weakness (generalized)  Rationale for Evaluation and Treatment: Rehabilitation  SUBJECTIVE:   SUBJECTIVE STATEMENT: Pt reports difficulty donning socksf Pt accompanied by: significant other  PERTINENT HISTORY: diabetes, hypercholesterolemia, high blood pressure, Parkinsonism possible PSP, hx of CVA , visual deficits L side   02/23/22 MRI brain 1. No reversible cause for symptoms. 2. Stable cortical and cerebellar volume loss. Somewhat prominent midbrain thinning, are there symptoms of supranuclear palsy? 3. Interval lacunar infarct in the right putamen, but overall mild chronic small vessel ischemia for age.   03/04/22 PET brain  - No significant loss of cortical metabolism to suggest Alzheimer's type dementia. No evidence of frontotemporal dementia   07/25/22 DATscan Significant bilateral decreased radiotracer activity within LEFT and RIGHT striatum. Deficit greater on the RIGHT. Findings suggestive of Parkinson's syndrome pathologies.    PRECAUTIONS: Fall   WEIGHT BEARING RESTRICTIONS: No  PAIN:  Are you having pain? No  FALLS: Has patient fallen in last 6 months? Yes. Number of falls multiple  LIVING ENVIRONMENT: Lives with: lives with their spouse Lives in: House/apartment Stairs: yes, has stair lift Has following equipment at home: stair lift , walkin shower  PLOF: Independent  PATIENT GOALS: stop losing balance, play  golf  OBJECTIVE:   HAND DOMINANCE: Right  ADLs: Overall ADLs: Pt is performing basic ADLs mod I however he has recent hx of falls Transfers/ambulation related to ADLs: Eating: difficulty with swallowing  Grooming: mod I UB Dressing: mod I LB Dressing: mod I Toileting: mod  I Bathing: mod I Tub Shower transfers: mod I Equipment: Grab bars and Walk in shower  IADLs: Shopping: grocery shops  with wife dropping him off Light housekeeping: washes dishes, does own laundry  Meal Prep: cooks occasionally  Community mobility: mod I with rollator Medication management: needs assist due to cognition, however pt is filling up pillbox Financial management: wife handles Handwriting:  NT  MOBILITY STATUS: Hx of falls  POSTURE COMMENTS:  rounded shoulders and forward head   FUNCTIONAL OUTCOME MEASURES: Fastening/unfastening 3 buttons: 52.41 Physical performance test: PPT#2 (simulated eating) 10.82 & PPT#4 (donning/doffing jacket): 30 secs  COORDINATION: 9 Hole Peg test: Right: 29.90 sec; Left: 49.28 sec Box and Blocks:  Right 40blocks, Left 29 blocks   UE ROM:  WFL  UE MMT:   WFL  SENSATION: Light touch: WFL  Vision: Pt is unable to track inferiorly which is consistent with PSP, difficulty tracking superiorly  COGNITION: Overall cognitive status: Impaired, short term memory deficits, slowed processing  OBSERVATIONS: Bradykinesia   TODAY'S TREATMENT:    Discussion with pt / wife, pt is starting to lash out at his wife. Therapist recommends that pt/ wife speak with a counselor. Environmental scanning 13/15 items located first pass, min v.c for scanning strategies. Pt missed 2 items that were low. Pt is going to look at an assisted living facility soon.  Handwriting activity,min v.c for legibility and letter size. Pt with improved performance. PATIENT EDUCATION: Education details: sit- stand from chair safely, donning doffing jacket with big movements, Discussed visual compensations due to inability to track in inferior visual field. Donning/ doffing shoe and sock with ankle crossed across knee, min v.c Person educated: Patient and Spouse Education method: Explanation, Demonstration, Verbal cues,  Education comprehension: verbalized understanding,  returned demonstration, and verbal cues required   HOME EXERCISE PROGRAM: Coordination HEP 11/05/22-Memory compensations, keeping thinking skills sharp    GOALS: Potential goals discussed with pt/ wife  SHORT TERM GOALS: Target date:11/07/22- extended due to scheduling     I with HEP. Baseline: Goal status:  ongoing, needs reinforcement  2.  Pt will verbalize understanding of adapted strategies to maximize safety and I with ADLs/ IADLs  including strategies to reduce falls.. Baseline:  Goal status: IN PROGRESS  3.  Pt will demonstrate improved LUE functional use as evidenced by increasing box/ blocks score by 3 blocks Baseline: LUE 29 blocks Goal status:met 36 blocks 11/05/22  4.  Pt will demonstrate understanding of memory compensations and ways to keep thinking skills sharp Baseline:  Goal status: met  5. Pt will write a his name with 100% legibility and minimal decrease in letter size Baseline:  Goal status: met  6. Pt/ wife will verbalize understanding of compensatory strategies for visual deficits.  Goal status: education initiated, needs reinforcement  LONG TERM GOALS: Target date:12/13/22   Pt will verbalize understanding of ways to prevent future  complications and PD community resources. Baseline:  Goal status: ongoing  2.  Pt will demonstrate improved fine motor coordination for ADLs as evidenced by decreasing 9 hole peg test score for LUE by 3 secs Baseline:  Goal status:not met, 51.44 11/21/22  3.  Pt will demonstrate increased ease with dressing as evidenced by  decreasing PPT#4(don/ doff jacket) to 25 secs or less Baseline:  Goal status: met 12.26 11/21/22  4.  Pt will demonstrate improved ease with fastening buttons as evidenced by decreasing 3 button/ unbutton time to :48 secs or less  Baseline:  Goal status:ongoing   ASSESSMENT:  CLINICAL IMPRESSION: Pt has made progress towards goals, hover he remains limited by cognitive and visual  deficits  PERFORMANCE DEFICITS: in functional skills including ADLs, IADLs, coordination, dexterity, flexibility, Fine motor control, Gross motor control, mobility, balance, decreased knowledge of precautions, decreased knowledge of use of DME, vision, and UE functional use, cognitive skills including attention, energy/drive, learn, memory, perception, problem solving, safety awareness, thought, and understand, and psychosocial skills including coping strategies, environmental adaptation, habits, interpersonal interactions, and routines and behaviors.   IMPAIRMENTS: are limiting patient from ADLs, IADLs, play, leisure, and social participation.   COMORBIDITIES:  may have co-morbidities  that affects occupational performance. Patient will benefit from skilled OT to address above impairments and improve overall function.  MODIFICATION OR ASSISTANCE TO COMPLETE EVALUATION: Min-Moderate modification of tasks or assist with assess necessary to complete an evaluation.  OT OCCUPATIONAL PROFILE AND HISTORY: Detailed assessment: Review of records and additional review of physical, cognitive, psychosocial history related to current functional performance.  CLINICAL DECISION MAKING: Moderate - several treatment options, min-mod task modification necessary  REHAB POTENTIAL: Good  EVALUATION COMPLEXITY: Moderate    PLAN:  OT FREQUENCY: 2x/week  OT DURATION: 12 weeks  PLANNED INTERVENTIONS: self care/ADL training, therapeutic exercise, therapeutic activity, neuromuscular re-education, manual therapy, passive range of motion, balance training, functional mobility training, aquatic therapy, splinting, ultrasound, paraffin, fluidotherapy, moist heat, cryotherapy, contrast bath, patient/family education, cognitive remediation/compensation, visual/perceptual remediation/compensation, energy conservation, coping strategies training, DME and/or AE instructions, and Re-evaluation  RECOMMENDED OTHER SERVICES:  n/a  CONSULTED AND AGREED WITH PLAN OF CARE: Patient  PLAN FOR NEXT SESSION: anticipate d/c next week, community resources, preventing future complications and respite care  Theone Murdoch, OTR/L 11/21/22 3:05 PM Phone [phone] FAX [phone]

## 2022-11-26 ENCOUNTER — Encounter: Payer: PPO | Admitting: Occupational Therapy

## 2022-11-26 DIAGNOSIS — F341 Dysthymic disorder: Secondary | ICD-10-CM | POA: Diagnosis not present

## 2022-11-28 ENCOUNTER — Encounter: Payer: PPO | Admitting: Occupational Therapy

## 2022-12-02 ENCOUNTER — Ambulatory Visit: Payer: PPO | Admitting: Occupational Therapy

## 2022-12-02 DIAGNOSIS — R278 Other lack of coordination: Secondary | ICD-10-CM

## 2022-12-02 DIAGNOSIS — M6281 Muscle weakness (generalized): Secondary | ICD-10-CM

## 2022-12-02 DIAGNOSIS — R293 Abnormal posture: Secondary | ICD-10-CM

## 2022-12-02 DIAGNOSIS — R2689 Other abnormalities of gait and mobility: Secondary | ICD-10-CM

## 2022-12-02 DIAGNOSIS — R29818 Other symptoms and signs involving the nervous system: Secondary | ICD-10-CM | POA: Diagnosis not present

## 2022-12-02 DIAGNOSIS — R2681 Unsteadiness on feet: Secondary | ICD-10-CM

## 2022-12-02 NOTE — Therapy (Signed)
OUTPATIENT OCCUPATIONAL THERAPY [facility]  Patient Name: Joel Webb MRN: [id number] DOB:08/17/1946, 77 y.o., male Today's Date: 12/02/2022  PCP: Dr. Moreen Fowler REFERRING PROVIDER: Dr. Leta Baptist  OCCUPATIONAL THERAPY DISCHARGE SUMMARY    Current functional level related to goals / functional outcomes: Pt made good overall progress towards goals. Pt met 4/5 long term goals.   Remaining deficits: Cognitive deficits, decreased functional mobility, decreased balance, decreased coordination, visual deficits   Education / Equipment: Pt/ wife were instructed in the following: Compensation for cognitive deficits, and visual deficits, ADL strategies, community resources, ways to prevent future complications. Pt/ wife verbalize understanding of all education.   Patient agrees to discharge. Patient goals were partially met. Patient is being discharged due to maximized rehab potential. .      OT End of Session - 12/02/22 1536     Visit Number 8    Number of Visits 25    Date for OT Re-Evaluation 12/13/22    Authorization Type [employer]    OT Start Time 1534    OT Stop Time 1615    OT Time Calculation (min) 41 min                Past Medical History:  Diagnosis Date   Chronic kidney disease, stage 3 01/11/2021   ED (erectile dysfunction)    Essential hypertension 06/05/2017   Frequent falls    Gait abnormality    Gastroesophageal reflux disease 01/11/2021   Major depressive disorder    Microalbuminuria due to type 2 diabetes mellitus 05/25/2020   Mild cognitive impairment of uncertain or unknown etiology    Mixed hyperlipidemia 02/28/2016   Multiple lacunar infarcts    Plantar fasciitis    Prostatitis    Pure hypercholesterolemia 01/11/2021   PVC's (premature ventricular contractions) 01/28/2022   Uncontrolled type 2 diabetes mellitus with hyperglycemia, with long-term current use of insulin 02/28/2016   Past Surgical History:  Procedure  Laterality Date   CIRCUMCISION     COLONOSCOPY     MOUTH SURGERY  01/2020   Patient Active Problem List   Diagnosis Date Noted   Multiple lacunar infarcts    Frequent falls    Gait abnormality    Major depressive disorder 05/30/2022   Mild cognitive impairment of uncertain or unknown etiology    PVC's (premature ventricular contractions) 01/28/2022   Pure hypercholesterolemia 01/11/2021   Gastroesophageal reflux disease 01/11/2021   Chronic kidney disease, stage 3 01/11/2021   Microalbuminuria due to type 2 diabetes mellitus 05/25/2020   Essential hypertension 06/05/2017   Uncontrolled type 2 diabetes mellitus with hyperglycemia, with long-term current use of insulin ([place]) 02/28/2016   Mixed hyperlipidemia 02/28/2016    ONSET DATE: referral 08/15/22  REFERRING DIAG: Parkinsonism  THERAPY DIAG:  Other lack of coordination  Unsteadiness on feet  Abnormal posture  Muscle weakness (generalized)  Other abnormalities of gait and mobility  Other symptoms and signs involving the nervous system  Rationale for Evaluation and Treatment: Rehabilitation  SUBJECTIVE:   SUBJECTIVE STATEMENT: Pt reports difficulty donning socksf Pt accompanied by: significant other  PERTINENT HISTORY: diabetes, hypercholesterolemia, high blood pressure, Parkinsonism possible PSP, hx of CVA , visual deficits L side   02/23/22 MRI brain 1. No reversible cause for symptoms. 2. Stable cortical and cerebellar volume loss. Somewhat prominent midbrain thinning, are there symptoms of supranuclear palsy? 3. Interval lacunar infarct in the right putamen, but overall mild chronic small vessel ischemia for age.   03/04/22 PET brain  - No significant loss  of cortical metabolism to suggest Alzheimer's type dementia. No evidence of frontotemporal dementia   07/25/22 DATscan Significant bilateral decreased radiotracer activity within LEFT and RIGHT striatum. Deficit greater on the RIGHT. Findings suggestive  of Parkinson's syndrome pathologies.    PRECAUTIONS: Fall   WEIGHT BEARING RESTRICTIONS: No  PAIN:  Are you having pain? No  FALLS: Has patient fallen in last 6 months? Yes. Number of falls multiple  LIVING ENVIRONMENT: Lives with: lives with their spouse Lives in: House/apartment Stairs: yes, has stair lift Has following equipment at home: stair lift , walkin shower  PLOF: Independent  PATIENT GOALS: stop losing balance, play golf  OBJECTIVE:   HAND DOMINANCE: Right  ADLs: Overall ADLs: Pt is performing basic ADLs mod I however he has recent hx of falls Transfers/ambulation related to ADLs: Eating: difficulty with swallowing  Grooming: mod I UB Dressing: mod I LB Dressing: mod I Toileting: mod I Bathing: mod I Tub Shower transfers: mod I Equipment: Grab bars and Walk in shower  IADLs: Shopping: grocery shops  with wife dropping him off Light housekeeping: washes dishes, does own laundry  Meal Prep: cooks occasionally  Community mobility: mod I with rollator Medication management: needs assist due to cognition, however pt is filling up pillbox Financial management: wife handles Handwriting:  NT  MOBILITY STATUS: Hx of falls  POSTURE COMMENTS:  rounded shoulders and forward head   FUNCTIONAL OUTCOME MEASURES: Fastening/unfastening 3 buttons: 52.41 Physical performance test: PPT#2 (simulated eating) 10.82 & PPT#4 (donning/doffing jacket): 30 secs  COORDINATION: 9 Hole Peg test: Right: 29.90 sec; Left: 49.28 sec Box and Blocks:  Right 40blocks, Left 29 blocks   UE ROM:  WFL  UE MMT:   WFL  SENSATION: Light touch: WFL  Vision: Pt is unable to track inferiorly which is consistent with PSP, difficulty tracking superiorly  COGNITION: Overall cognitive status: Impaired, short term memory deficits, slowed processing  OBSERVATIONS: Bradykinesia   TODAY'S TREATMENT:    Pt and wife were educated regarding safety for sit- stand and review of  coordination HEP. Therapist checked progress towards goals. Pt/ wife agree with plans for PD screen in 6 mons.   PATIENT EDUCATION: Education details:Pt and wife were educated regarding sit- stand and review of coordination HEP. Community resources provided and education regarding preventing future complications(ie: falls) Person educated: Patient and Spouse Education method: Consulting civil engineer, Media planner, Verbal cues, handout Education comprehension: verbalized understanding, returned demonstration, and verbal cues required   HOME EXERCISE PROGRAM: Coordination HEP 11/05/22-Memory compensations, keeping thinking skills sharp    GOALS: Potential goals discussed with pt/ wife  SHORT TERM GOALS: Target date:11/07/22- extended due to scheduling     I with HEP. Baseline: Goal status: met  2.  Pt will verbalize understanding of adapted strategies to maximize safety and I with ADLs/ IADLs  including strategies to reduce falls.. Baseline:  Goal status: met  3.  Pt will demonstrate improved LUE functional use as evidenced by increasing box/ blocks score by 3 blocks Baseline: LUE 29 blocks Goal status:met 36 blocks 11/05/22  4.  Pt will demonstrate understanding of memory compensations and ways to keep thinking skills sharp Baseline:  Goal status: met  5. Pt will write a his name with 100% legibility and minimal decrease in letter size Baseline:  Goal status: met  6. Pt/ wife will verbalize understanding of compensatory strategies for visual deficits.  Goal status: met, pt/wife verbalize understanding LONG TERM GOALS: Target date:12/13/22   Pt will verbalize understanding of ways to prevent future  complications and PD community resources. Baseline:  Goal status: met  2.  Pt will demonstrate improved fine motor coordination for ADLs as evidenced by decreasing 9 hole peg test score for LUE by 3 secs Baseline:  Goal status:not met, 51.44 11/21/22  3.  Pt will demonstrate  increased ease with dressing as evidenced by decreasing PPT#4(don/ doff jacket) to 25 secs or less Baseline:  Goal status: met 12.26 11/21/22  4.  Pt will demonstrate improved ease with fastening buttons as evidenced by decreasing 3 button/ unbutton time to :48 secs or less  Baseline:  Goal status:met 35.34   ASSESSMENT:  CLINICAL IMPRESSION: Pt has made good overall progress. He did not fully achieve all goals due to the severity of his cognitive deficits  PERFORMANCE DEFICITS: in functional skills including ADLs, IADLs, coordination, dexterity, flexibility, Fine motor control, Gross motor control, mobility, balance, decreased knowledge of precautions, decreased knowledge of use of DME, vision, and UE functional use, cognitive skills including attention, energy/drive, learn, memory, perception, problem solving, safety awareness, thought, and understand, and psychosocial skills including coping strategies, environmental adaptation, habits, interpersonal interactions, and routines and behaviors.   IMPAIRMENTS: are limiting patient from ADLs, IADLs, play, leisure, and social participation.   COMORBIDITIES:  may have co-morbidities  that affects occupational performance. Patient will benefit from skilled OT to address above impairments and improve overall function.  MODIFICATION OR ASSISTANCE TO COMPLETE EVALUATION: Min-Moderate modification of tasks or assist with assess necessary to complete an evaluation.  OT OCCUPATIONAL PROFILE AND HISTORY: Detailed assessment: Review of records and additional review of physical, cognitive, psychosocial history related to current functional performance.  CLINICAL DECISION MAKING: Moderate - several treatment options, min-mod task modification necessary  REHAB POTENTIAL: Good  EVALUATION COMPLEXITY: Moderate    PLAN:  OT FREQUENCY: 2x/week  OT DURATION: 12 weeks  PLANNED INTERVENTIONS: self care/ADL training, therapeutic exercise, therapeutic  activity, neuromuscular re-education, manual therapy, passive range of motion, balance training, functional mobility training, aquatic therapy, splinting, ultrasound, paraffin, fluidotherapy, moist heat, cryotherapy, contrast bath, patient/family education, cognitive remediation/compensation, visual/perceptual remediation/compensation, energy conservation, coping strategies training, DME and/or AE instructions, and Re-evaluation  RECOMMENDED OTHER SERVICES: n/a  CONSULTED AND AGREED WITH PLAN OF CARE: Patient  PLAN FOR NEXT SESSION: d/c OT, PD screens in 6 months Theone Murdoch, OTR/L 12/02/22 3:38 PM Phone [phone] FAX [phone]

## 2022-12-02 NOTE — Patient Instructions (Signed)
  Coordination Activities  Perform the following activities for 20 minutes 1 times per day with both hand(s).  Rotate ball in fingertips (clockwise and counter-clockwise). Toss ball between hands. Flip cards 1 at a time  Deal cards with your thumb (Hold deck in hand and push card off top with thumb). Pick up coins and place in container or coin bank. Pick up coins and stack. Pick up coins one at a time until you get 5-10 in your hand, then move coins from palm to fingertips to stack one at a time. Practice writing and/or typing.

## 2022-12-03 ENCOUNTER — Encounter: Payer: PPO | Admitting: Occupational Therapy

## 2022-12-05 ENCOUNTER — Ambulatory Visit: Payer: PPO | Admitting: Occupational Therapy

## 2022-12-16 DIAGNOSIS — F341 Dysthymic disorder: Secondary | ICD-10-CM | POA: Diagnosis not present

## 2022-12-26 DIAGNOSIS — N189 Chronic kidney disease, unspecified: Secondary | ICD-10-CM | POA: Diagnosis not present

## 2022-12-26 DIAGNOSIS — N400 Enlarged prostate without lower urinary tract symptoms: Secondary | ICD-10-CM | POA: Diagnosis not present

## 2022-12-26 DIAGNOSIS — E782 Mixed hyperlipidemia: Secondary | ICD-10-CM | POA: Diagnosis not present

## 2022-12-26 DIAGNOSIS — E1122 Type 2 diabetes mellitus with diabetic chronic kidney disease: Secondary | ICD-10-CM | POA: Diagnosis not present

## 2022-12-26 DIAGNOSIS — E7800 Pure hypercholesterolemia, unspecified: Secondary | ICD-10-CM | POA: Diagnosis not present

## 2022-12-26 DIAGNOSIS — E1165 Type 2 diabetes mellitus with hyperglycemia: Secondary | ICD-10-CM | POA: Diagnosis not present

## 2022-12-26 DIAGNOSIS — G231 Progressive supranuclear ophthalmoplegia [Steele-Richardson-Olszewski]: Secondary | ICD-10-CM | POA: Diagnosis not present

## 2022-12-26 DIAGNOSIS — K219 Gastro-esophageal reflux disease without esophagitis: Secondary | ICD-10-CM | POA: Diagnosis not present

## 2022-12-26 DIAGNOSIS — I10 Essential (primary) hypertension: Secondary | ICD-10-CM | POA: Diagnosis not present

## 2022-12-26 DIAGNOSIS — Z0000 Encounter for general adult medical examination without abnormal findings: Secondary | ICD-10-CM | POA: Diagnosis not present

## 2022-12-26 DIAGNOSIS — G319 Degenerative disease of nervous system, unspecified: Secondary | ICD-10-CM | POA: Diagnosis not present

## 2022-12-26 DIAGNOSIS — Z1331 Encounter for screening for depression: Secondary | ICD-10-CM | POA: Diagnosis not present

## 2022-12-26 DIAGNOSIS — Z23 Encounter for immunization: Secondary | ICD-10-CM | POA: Diagnosis not present

## 2022-12-26 DIAGNOSIS — D696 Thrombocytopenia, unspecified: Secondary | ICD-10-CM | POA: Diagnosis not present

## 2022-12-26 DIAGNOSIS — N1831 Chronic kidney disease, stage 3a: Secondary | ICD-10-CM | POA: Diagnosis not present

## 2022-12-26 DIAGNOSIS — F339 Major depressive disorder, recurrent, unspecified: Secondary | ICD-10-CM | POA: Diagnosis not present

## 2023-01-06 ENCOUNTER — Ambulatory Visit: Payer: PPO | Admitting: Diagnostic Neuroimaging

## 2023-01-06 DIAGNOSIS — F341 Dysthymic disorder: Secondary | ICD-10-CM | POA: Diagnosis not present

## 2023-01-08 ENCOUNTER — Ambulatory Visit (INDEPENDENT_AMBULATORY_CARE_PROVIDER_SITE_OTHER): Payer: HMO | Admitting: Diagnostic Neuroimaging

## 2023-01-08 ENCOUNTER — Encounter: Payer: Self-pay | Admitting: Diagnostic Neuroimaging

## 2023-01-08 VITALS — BP 128/83 | HR 61 | Ht 69.0 in | Wt 178.0 lb

## 2023-01-08 DIAGNOSIS — R4189 Other symptoms and signs involving cognitive functions and awareness: Secondary | ICD-10-CM

## 2023-01-08 DIAGNOSIS — G20C Parkinsonism, unspecified: Secondary | ICD-10-CM | POA: Diagnosis not present

## 2023-01-08 DIAGNOSIS — R269 Unspecified abnormalities of gait and mobility: Secondary | ICD-10-CM | POA: Diagnosis not present

## 2023-01-08 DIAGNOSIS — G231 Progressive supranuclear ophthalmoplegia [Steele-Richardson-Olszewski]: Secondary | ICD-10-CM

## 2023-01-08 DIAGNOSIS — R296 Repeated falls: Secondary | ICD-10-CM

## 2023-01-08 NOTE — Progress Notes (Signed)
[facility]  PATIENT: Joel Webb DOB: 07-22-1946  REFERRING CLINICIAN: Antony Contras, MD HISTORY FROM: patient and wife REASON FOR VISIT: follow up   HISTORICAL  CHIEF COMPLAINT:  Chief Complaint  Patient presents with   Follow-up    Pt with wife,Rm 7,  increase in falls. Balance has been off. Completed PT/OT/ST they completed a swallow test and he has started choking/coughing a lot.      HISTORY OF PRESENT ILLNESS:   UPDATE (01/08/23, VRP): Since last visit, continues with falls and choking issues. Completed PT, OT, ST.   UPDATE (08/13/22, VRP): Since last visit, continues with gait abnormality, oral dyskinesias, eye movement difficulty, poor insight.  UPDATE (07/02/22, VRP): Since last visit, here for evaluation of gait difficulty, cognitive impairment, visual abnormalities.  Concern for progressive supranuclear palsy raised.  Wife concerned about patient's driving ability.  He agrees to stop driving today.  UPDATE (03/12/22, VRP): Since last visit, doing about the same. Symptoms are mainly balance issues, mood changes, memory changes, mainly noted per wife.   UPDATE (02/11/22, VRP): Since last visit, memory and balance issues continue. Neuropsych testing in 2022 showed similar issues; not clearly AD or FTD. Also had hypotension in Dec 2022 related to medication changes. More issues veering to the left (walking and driving).  UPDATE (10/16/20, VRP): Since last visit, patient feels stable.  He feels like his memory balance difficulties are stable.  Patient's wife is very concerned about progressive attention and focus difficulty, leaving the stove on, bumping into the wall on the left side, bruising his left arm, zoning out, not paying attention to her conversations.  Continues to have issues with marital discord.  UPDATE (06/12/20, VRP): Since last visit, doing well. Symptoms are improved. Severity is mild. No alleviating or aggravating factors. Tolerating  meds. Memory and gait improved. Now exercising at [facility] 4x per week and feeling stronger.   UPDATE (02/28/20, Dr. Nicole Kindred): "I met with [name] to review the findings resulting from his neuropsychological evaluation. Since the last appointment, he has been about the same. Time was spent reviewing the impressions and recommendations that are detailed in the evaluation report. I explained that int he present case, we do not have enough for a diagnosis of frank cognitive impairment and that I think depression, relational issues, and other more common reversible factors are the likely causes of Mr. Mcmurdie difficulties. I counseled them about things to watch out for in FTD and was candid about the fact that the condition is often misdiagnosed as depression or relationship distress. We now have a strong baseline against which to compare Mr. Ohr future performance. Interventions provided during this encounter included psychoeducation, and other topics as reflected in the patient instructions. I took time to explain the findings and answer all the patient's questions. I encouraged Mr. Doolan to contact me should he have any further questions or if further follow up is desired."  UPDATE (01/26/20, VRP): Since last visit, doing about the same. Symptoms are persistent. Severity is moderate per patient. No alleviating or aggravating factors. Wife still notes memory and personality changes.  PRIOR HPI (12/22/19): 77 year old male here for evaluation of gait and balance difficulty.  History of diabetes, hypercholesterolemia, high blood pressure.  For past 6 to 8 months patient has had sensation of stumbling, tripping, numbness in feet, left foot incoordination.  Symptoms worsening over time.  He has more difficulty with steps and uneven surfaces.  He notices problem in his left foot.  Has  had some back pain in the past.  Wife also notes at least 1 year of memory loss, personality and mood changes, irritable,  paranoid, forgetting anniversary dates, forgetting medications and appointments, leaving stove on.  Patient and wife had COVID in Jan 2021.    REVIEW OF SYSTEMS: Full 14 system review of systems performed and negative with exception of: As per HPI.  ALLERGIES: No Known Allergies  HOME MEDICATIONS: Outpatient Medications Prior to Visit  Medication Sig Dispense Refill   amLODipine (NORVASC) 5 MG tablet Take 5 mg by mouth daily.     atorvastatin (LIPITOR) 80 MG tablet Take 80 mg by mouth daily.     BD INSULIN SYRINGE U/F 31G X 5/16" 1 ML MISC USE TO GIVE 2 INSULIN INJECTIONS DAILY AS DIRECTED BY YOUR DOCTOR 200 each 5   Blood Glucose Monitoring Suppl (ACCU-CHEK GUIDE) w/Device KIT Use to check blood sugar once a day 1 kit 0   DULoxetine (CYMBALTA) 60 MG capsule 60 mg daily.     ezetimibe (ZETIA) 10 MG tablet Take 1 tablet (10 mg total) by mouth daily. 90 tablet 3   finasteride (PROSCAR) 5 MG tablet      glucose blood (ACCU-CHEK GUIDE) test strip Use as instructed to check blood sugar once a day dx code E11.65 100 each 2   insulin degludec (TRESIBA FLEXTOUCH) 200 UNIT/ML FlexTouch Pen INJECT 40 UNITS UNDER THE SKIN DAILY 18 mL 0   Insulin Pen Needle ([employer] PEN NEEDLES) 32G X 4 MM MISC USE ON INSULIN PEN 4 TIMES DAILY 100 each 0   MOUNJARO 7.5 MG/0.5ML Pen Inject 7.5 mg into the skin once a week.     Multiple Vitamin (MULTIVITAMIN WITH MINERALS) TABS tablet Take 1 tablet by mouth daily.     NOVOLIN R 100 UNIT/ML injection INJECT 0.08 MLS(8 UNITS TOTAL) INTO THE SKIN TWICE DAILY BEFORE A MEAL; ALSO INJECT 10 UNITS UNDER THE SKIN ONCE DAILY AT SUPPER (Patient taking differently: 6units am, 8-10units lunch, 12 units at supper) 10 mL 3   olmesartan (BENICAR) 20 MG tablet TAKE ONE TABLET BY MOUTH DAILY; (REPLACING BENICAR HCT-DOCTOR KUMAR) 90 tablet 1   Omega-3 Fatty Acids (FISH OIL) 1000 MG CAPS Take by mouth. 2x/day     omeprazole (PRILOSEC) 20 MG capsule Take 20 mg by mouth every other day. PRN      OneTouch Delica Lancets [id number] MISC Use to check blood sugar once a day 100 each 2   tamsulosin (FLOMAX) 0.4 MG CAPS capsule Take 0.4 mg by mouth. Reported on [id number]     TRULICITY 1.5 0000000 SOPN INJECT 1.5 MG UNDER THE SKIN ONCE WEEKLY 2 mL 5   No facility-administered medications prior to visit.    PAST MEDICAL HISTORY: Past Medical History:  Diagnosis Date   Chronic kidney disease, stage 3 01/11/2021   ED (erectile dysfunction)    Essential hypertension 06/05/2017   Frequent falls    Gait abnormality    Gastroesophageal reflux disease 01/11/2021   Major depressive disorder    Microalbuminuria due to type 2 diabetes mellitus 05/25/2020   Mild cognitive impairment of uncertain or unknown etiology    Mixed hyperlipidemia 02/28/2016   Multiple lacunar infarcts    Plantar fasciitis    Prostatitis    Pure hypercholesterolemia 01/11/2021   PVC's (premature ventricular contractions) 01/28/2022   Uncontrolled type 2 diabetes mellitus with hyperglycemia, with long-term current use of insulin 02/28/2016    PAST SURGICAL HISTORY: Past Surgical History:  Procedure Laterality Date  CIRCUMCISION     COLONOSCOPY     MOUTH SURGERY  01/2020    FAMILY HISTORY: Family History  Problem Relation Age of Onset   Hepatitis C Mother    Mesothelioma Father    Diabetes Father    Heart disease Father     SOCIAL HISTORY: Social History   Socioeconomic History   Marital status: Married    Spouse name: [name]   Number of children: 1   Years of education: 16   Highest education level: Bachelor's degree (e.g., BA, AB, BS)  Occupational History   Occupation: Retired    Comment: sales  Tobacco Use   Smoking status: Former    Types: Cigarettes    Quit date: 12/20/2008    Years since quitting: 14.0   Smokeless tobacco: Never  Substance and Sexual Activity   Alcohol use: Yes    Comment: rarely   Drug use: No   Sexual activity: Not on file  Other Topics Concern   Not on file   Social History Narrative   01/26/20 Lives with wife   Caffeine, none   Social Determinants of Health   Financial Resource Strain: Not on file  Food Insecurity: Not on file  Transportation Needs: Not on file  Physical Activity: Not on file  Stress: Not on file  Social Connections: Not on file  Intimate Partner Violence: Not on file     PHYSICAL EXAM  GENERAL EXAM/CONSTITUTIONAL: Vitals:  Vitals:   01/08/23 1327  BP: 128/83  Pulse: 61  Weight: 178 lb (80.7 kg)  Height: 5' 9"$  (1.753 m)   Body mass index is 26.29 kg/m. Wt Readings from Last 3 Encounters:  01/08/23 178 lb (80.7 kg)  07/02/22 189 lb 4 oz (85.8 kg)  06/27/22 190 lb (86.2 kg)   Patient is in no distress; well developed, nourished and groomed; neck is supple  CARDIOVASCULAR: Examination of carotid arteries is normal; no carotid bruits Regular rate and rhythm, no murmurs Examination of peripheral vascular system by observation and palpation is normal  EYES: Ophthalmoscopic exam of optic discs and posterior segments is normal; no papilledema or hemorrhages No results found.  MUSCULOSKELETAL: Gait, strength, tone, movements noted in Neurologic exam below  NEUROLOGIC: MENTAL STATUS:     01/08/2023    1:32 PM 02/11/2022   10:38 AM 10/16/2020    4:21 PM  MMSE - Mini Mental State Exam  Orientation to time 5 5 4  $ Orientation to Place 5 4 4  $ Registration 3 3 2  $ Attention/ Calculation 3 5 5  $ Recall 3 3 3  $ Language- name 2 objects 2 2 2  $ Language- repeat 1 1 0  Language- follow 3 step command 3 3 3  $ Language- read & follow direction 1 1 1  $ Write a sentence 1 1 1  $ Copy design 1 1 1  $ Total score 28 29 26   $ awake, alert, oriented to person, place and time recent and remote memory intact normal attention and concentration language fluent, comprehension intact, naming intact fund of knowledge appropriate  CRANIAL NERVE:  2nd - no papilledema on fundoscopic exam 2nd, 3rd, 4th, 6th - pupils equal and  reactive to light, visual fields full to confrontation, extraocular muscles --> DECR UPGAZE AND DOWNGAZE  ON SMOOTH PURSUIT; SACCADIC BREAKDOWN OF SMOOTH PUSUIT ON LATERAL GAZE; no nystagmus 5th - facial sensation symmetric 7th - facial strength symmetric; OROLINGUAL DYSKINESIA; CHEWING ON INSIDE OF LEFT CHEEK 8th - hearing intact 9th - palate elevates symmetrically, uvula midline 11th -  shoulder shrug symmetric 12th - tongue protrusion midline DECR EYE BLINKING   MOTOR:  INCREASE TONE IN BUE normal bulk and tone, full strength in the BUE, BLE   SENSORY:  normal and symmetric to light touch   COORDINATION:  finger-nose-finger, fine finger movements normal   REFLEXES:  deep tendon reflexes TRACE and symmetric   GAIT/STATION:  narrow based gait; STRAIGHT ARMS, DECR ARM SWING; FAST GAIT    DIAGNOSTIC DATA (LABS, IMAGING, TESTING) - I reviewed patient records, labs, notes, testing and imaging myself where available.  Lab Results  Component Value Date   WBC 8.4 03/13/2022   HGB 13.6 03/13/2022   HCT 40.5 03/13/2022   MCV 90.4 03/13/2022   PLT 145 (L) 03/13/2022      Component Value Date/Time   NA 139 06/04/2022 0850   K 4.7 06/04/2022 0850   CL 101 06/04/2022 0850   CO2 21 06/04/2022 0850   GLUCOSE 196 (H) 06/04/2022 0850   GLUCOSE 141 (H) 03/13/2022 1548   BUN 26 06/04/2022 0850   CREATININE 1.46 (H) 06/04/2022 0850   CALCIUM 9.8 06/04/2022 0850   PROT 6.4 06/04/2022 0850   ALBUMIN 4.0 06/04/2022 0850   AST 12 06/04/2022 0850   ALT 15 06/04/2022 0850   ALKPHOS 123 (H) 06/04/2022 0850   BILITOT 0.4 06/04/2022 0850   GFRNONAA 38 (L) 03/13/2022 1548   Lab Results  Component Value Date   CHOL 181 06/04/2022   HDL 33 (L) 06/04/2022   LDLCALC 120 (H) 06/04/2022   LDLDIRECT 107.0 01/08/2022   TRIG 158 (H) 06/04/2022   CHOLHDL 5.5 (H) 06/04/2022   Lab Results  Component Value Date   HGBA1C 7.6 (H) 01/08/2022   Lab Results  Component Value Date   [id number] 12/22/2019   Lab Results  Component Value Date   TSH 1.71 02/02/2016    01/12/20 MRI brain [I reviewed images myself and agree with interpretation. -VRP]  - Mild atrophy and mild chronic small vessel ischemic disease. - No acute findings.  01/12/20 MRI cervical spine [I reviewed images myself and agree with interpretation. -VRP]  - At C3-4: disc bulging and facet hypertrophy with mild spinal stenosis and severe biforaminal stenosis. - At C2-3: disc bulging and facet hypertrophy with moderate biforaminal stenosis.  02/23/22 MRI brain 1. No reversible cause for symptoms. 2. Stable cortical and cerebellar volume loss. Somewhat prominent midbrain thinning, are there symptoms of supranuclear palsy? 3. Interval lacunar infarct in the right putamen, but overall mild chronic small vessel ischemia for age.  03/04/22 PET brain  - No significant loss of cortical metabolism to suggest Alzheimer's type dementia. No evidence of frontotemporal dementia  07/25/22 DATscan Significant bilateral decreased radiotracer activity within LEFT and RIGHT striatum. Deficit greater on the RIGHT. Findings suggestive of Parkinson's syndrome pathologies.    ASSESSMENT AND PLAN  77 y.o. year old male here with mild gait and balance difficulty, incoordination, memory loss, personality mood changes.   Dx:  1. PSP (progressive supranuclear palsy) ([place])   2. Frequent falls   3. Parkinsonism, unspecified Parkinsonism type   4. Cognitive deficits   5. Gait difficulty      PLAN:  GAIT DIFF / OCULOMOTOR DYSFUNCTION / FACIAL DYSKINESIA (possible PSP vs other atypical parkinsonism, MSA, neurodegenerative dz) - safety precautions reviewed - no driving; fall precautions; use cane / walker - continue PT, OT, ST exercises at home  MEMORY LOSS / BEHAVIOR / PERSONALITY changes (could be related to neurodegenerative dz; depression, marital issues,  hearing loss also may be contributory) ->Mild Neurocognitive  Disorder ("mild cognitive impairment") - continue supportive care - safety / supervision issues reviewed - daily physical activity / exercise (at least 15-30 minutes) - eat more plants / vegetables - increase social activities, brain stimulation, games, puzzles, hobbies, crafts, arts, music - aim for at least 7-8 hours sleep per night (or more) - avoid smoking and alcohol - caution with medications, finances; no driving  Return for pending if symptoms worsen or fail to improve, return to PCP.       Penni Bombard, MD [id number], [id number] PM Certified in Neurology, Neurophysiology and Neuroimaging  [facility] [address], [place], [place] [address] [phone]

## 2023-01-08 NOTE — Patient Instructions (Signed)
GAIT DIFF / OCULOMOTOR DYSFUNCTION / FACIAL DYSKINESIA (possible PSP vs other atypical parkinsonism, MSA, neurodegenerative dz) - safety precautions reviewed - no driving; fall precautions; use cane / walker - continue PT, OT, ST exercises at home  MEMORY LOSS / BEHAVIOR / PERSONALITY changes (could be related to neurodegenerative dz; depression, marital issues, hearing loss also may be contributory) ->Mild Neurocognitive Disorder ("mild cognitive impairment") - continue supportive care - safety / supervision issues reviewed - daily physical activity / exercise (at least 15-30 minutes) - eat more plants / vegetables - increase social activities, brain stimulation, games, puzzles, hobbies, crafts, arts, music - aim for at least 7-8 hours sleep per night (or more) - avoid smoking and alcohol - caution with medications, finances; no driving

## 2023-01-13 DIAGNOSIS — N1831 Chronic kidney disease, stage 3a: Secondary | ICD-10-CM | POA: Diagnosis not present

## 2023-01-20 ENCOUNTER — Other Ambulatory Visit: Payer: Self-pay | Admitting: Endocrinology

## 2023-01-20 DIAGNOSIS — E1165 Type 2 diabetes mellitus with hyperglycemia: Secondary | ICD-10-CM

## 2023-01-23 DIAGNOSIS — E782 Mixed hyperlipidemia: Secondary | ICD-10-CM | POA: Diagnosis not present

## 2023-01-23 DIAGNOSIS — N1831 Chronic kidney disease, stage 3a: Secondary | ICD-10-CM | POA: Diagnosis not present

## 2023-01-23 DIAGNOSIS — I10 Essential (primary) hypertension: Secondary | ICD-10-CM | POA: Diagnosis not present

## 2023-01-23 DIAGNOSIS — E1122 Type 2 diabetes mellitus with diabetic chronic kidney disease: Secondary | ICD-10-CM | POA: Diagnosis not present

## 2023-01-23 DIAGNOSIS — F329 Major depressive disorder, single episode, unspecified: Secondary | ICD-10-CM | POA: Diagnosis not present

## 2023-01-30 DIAGNOSIS — F341 Dysthymic disorder: Secondary | ICD-10-CM | POA: Diagnosis not present

## 2023-01-31 DIAGNOSIS — M109 Gout, unspecified: Secondary | ICD-10-CM | POA: Diagnosis not present

## 2023-01-31 DIAGNOSIS — E1165 Type 2 diabetes mellitus with hyperglycemia: Secondary | ICD-10-CM | POA: Diagnosis not present

## 2023-02-06 DIAGNOSIS — R3914 Feeling of incomplete bladder emptying: Secondary | ICD-10-CM | POA: Diagnosis not present

## 2023-02-06 DIAGNOSIS — N403 Nodular prostate with lower urinary tract symptoms: Secondary | ICD-10-CM | POA: Diagnosis not present

## 2023-02-06 DIAGNOSIS — R310 Gross hematuria: Secondary | ICD-10-CM | POA: Diagnosis not present

## 2023-02-15 ENCOUNTER — Other Ambulatory Visit: Payer: Self-pay | Admitting: Endocrinology

## 2023-02-15 DIAGNOSIS — E1165 Type 2 diabetes mellitus with hyperglycemia: Secondary | ICD-10-CM

## 2023-02-25 DIAGNOSIS — M109 Gout, unspecified: Secondary | ICD-10-CM | POA: Diagnosis not present

## 2023-02-25 DIAGNOSIS — I10 Essential (primary) hypertension: Secondary | ICD-10-CM | POA: Diagnosis not present

## 2023-02-26 ENCOUNTER — Encounter: Payer: Self-pay | Admitting: Diagnostic Neuroimaging

## 2023-02-26 DIAGNOSIS — R269 Unspecified abnormalities of gait and mobility: Secondary | ICD-10-CM

## 2023-02-26 DIAGNOSIS — G231 Progressive supranuclear ophthalmoplegia [Steele-Richardson-Olszewski]: Secondary | ICD-10-CM

## 2023-02-26 DIAGNOSIS — R4189 Other symptoms and signs involving cognitive functions and awareness: Secondary | ICD-10-CM

## 2023-02-26 DIAGNOSIS — R296 Repeated falls: Secondary | ICD-10-CM

## 2023-02-26 DIAGNOSIS — G20C Parkinsonism, unspecified: Secondary | ICD-10-CM

## 2023-02-27 ENCOUNTER — Other Ambulatory Visit: Payer: Self-pay

## 2023-03-03 NOTE — Telephone Encounter (Signed)
Spoke with Eunice Blase, contact at [facility] and she is unaware of having these. She will dig further into looking into this. She provided me also with their phone number to the intake team at [facility] which should be able to assist further. [phone].

## 2023-03-07 DIAGNOSIS — R310 Gross hematuria: Secondary | ICD-10-CM | POA: Diagnosis not present

## 2023-03-07 DIAGNOSIS — N281 Cyst of kidney, acquired: Secondary | ICD-10-CM | POA: Diagnosis not present

## 2023-03-07 DIAGNOSIS — K5730 Diverticulosis of large intestine without perforation or abscess without bleeding: Secondary | ICD-10-CM | POA: Diagnosis not present

## 2023-03-07 DIAGNOSIS — R319 Hematuria, unspecified: Secondary | ICD-10-CM | POA: Diagnosis not present

## 2023-03-10 ENCOUNTER — Other Ambulatory Visit: Payer: Self-pay

## 2023-03-10 DIAGNOSIS — M79672 Pain in left foot: Secondary | ICD-10-CM | POA: Diagnosis not present

## 2023-03-10 DIAGNOSIS — Z515 Encounter for palliative care: Secondary | ICD-10-CM

## 2023-03-10 DIAGNOSIS — I10 Essential (primary) hypertension: Secondary | ICD-10-CM | POA: Diagnosis not present

## 2023-03-10 DIAGNOSIS — W19XXXA Unspecified fall, initial encounter: Secondary | ICD-10-CM | POA: Diagnosis not present

## 2023-03-10 NOTE — Telephone Encounter (Signed)
Called the number, [phone], and was able to speak with Urcella. She is also not familiar with weighted walker She placed on hold to reach out and discuss further with team members and no one was aware. She did say we could place the order and send over to their intake department who will then reach out to proper location and have them see if that can be processed.  If not, they will let the patient know.

## 2023-03-17 ENCOUNTER — Other Ambulatory Visit: Payer: Self-pay

## 2023-03-17 VITALS — BP 120/80 | HR 62 | Resp 18

## 2023-03-17 DIAGNOSIS — R3912 Poor urinary stream: Secondary | ICD-10-CM | POA: Diagnosis not present

## 2023-03-17 DIAGNOSIS — R310 Gross hematuria: Secondary | ICD-10-CM | POA: Diagnosis not present

## 2023-03-17 DIAGNOSIS — Z515 Encounter for palliative care: Secondary | ICD-10-CM

## 2023-03-17 DIAGNOSIS — N403 Nodular prostate with lower urinary tract symptoms: Secondary | ICD-10-CM | POA: Diagnosis not present

## 2023-03-18 NOTE — Progress Notes (Signed)
1409- Palliative Care Initial Encounter Note   PATIENT NAME: Joel Webb DOB: [birth year] MRN: [id number]  PRIMARY CARE PROVIDER: Tally Joe, MD  RESPONSIBLE PARTY:  Acct ID - Guarantor Home Phone Work Phone Relationship Acct Type  [id number] Jamison Neighbor* [phone]  Self P/F     [address], [place], [place] [address]   RN completed home visit. Wife also present   HISTORY OF PRESENT ILLNESS:  77yo male with history of HTN, PVCs, GERD, CKD III, multiple lacunar infarcts, DMII, Hyperlipidemia, Depression, frequent falls, PSP progressive supernuclear palsy- a rare brain disease that affects walking, balance, eye movements and swallowing., and cognitive deficits.   Socially: lives with wife in two story home for 35 years. Has stair lift and spends a lot of time in the basement. Has one daughter in [place]. Wife lives with him and is having cataract surgery tomorrow.   Cognitive: Alert. Oriented to people, place. Looks to wife to answer many questions. Many answers during visits was, "because I don't want too."  Pt corrects wife a lot during visits. Wife explains that he worked at home for many years and at onset of covid when she started working from home, she noticed his personality changing and that he was having balance issues. "He used to be much calmer than he is, now he gets frustrated and doesn't want to really be around anyone."  RN to mail Dementia Resource Guide book to wife. Gave an introductory explanation of booklet and then asked wife to look thru it. We will discuss further at next visit.   Appetite: Difficulty swallowing (caused by psp). Supposed to use thickened liquids, be on softer foods. Warm liquids like coffee is harder than cold liquids to swallow. Wife reports that he is supposed to take meds with pudding or applesauce. Pt "doesn't like it."   Mobility: Uses standard walker at times. "Doesn't want to have to use it so he doesn't." Wife is working with  pcp to get weighted walker. Balance issues. Pt had tendencies to fall backwards with PSP.   Falls: Pt has very frequent falls. Fell x 3 last week. Does have emergency alert necklace on. States, " I put it on because I knew you were coming."  Sleeping Pattern: Pt reports that he sleeps approx. 9 hours at night and then will take a nap in the afternoons. Wife reports that often times, he "drifts off in front of his computer or the tv"  Pain: Denies any type of chronic pain.  Gi/GU: Denies any issues with bowel or bladder. No incontinence.  Palliative Care/ Hospice: RN explained role and purpose of palliative care including visit frequency. Also discussed benefits of hospice care as well as the differences between the two with patient.   Goals of Care: Pt wants to remain at home as long as possible. When his wife cannot take care of him at home, his plan is to move in to an assisted living facility.   RN will notify SW so she can begin work on FL-[place], explained to pt and wife that there are often times waiting lists for AL.    CODE STATUS: Full Code ADVANCED DIRECTIVES: N MOST FORM: No- Most form given to wife with explanation and instructions. Voiced understanding PPS: 60  Next appt scheduled: Dee and/or Monica to call and schedule follow up visit in 3-4 weeks.     PHYSICAL EXAM:   VITALS: Today's Vitals   03/17/23 1418  BP: 120/80  Pulse: 62  Resp: 18  SpO2: 95%    LUNGS: clear bilaterally, non prod cough occasionally  CARDIAC:  regular, no JVD EXTREMITIES: MAEx 4, no edema SKIN: bruises noted to bilat forearms   Barbette Merino, RN

## 2023-03-19 NOTE — Telephone Encounter (Signed)
Here is documentation between neuro PT and myself on the walker

## 2023-03-21 NOTE — Progress Notes (Signed)
Initial Palliative Care Telephonic Encounter Patient is diagnosed with PSP. Patient has gout in his foot. [facility] is managing his medications. He had a fall at about 3:30 am this morning, with no injuries. He also fell twice on Tuesdays. His PT, OT and ST concluded in February. His swallowing is worsening and coughs/chokes when he is eating and sometimes when speaking. The goal is to move patient into a LTC facility.  His wife is planning to have cataract surgery tomorrow.  He has a follow-up visit scheduled for 03/17/23 with palliative care RN>

## 2023-04-08 DIAGNOSIS — G231 Progressive supranuclear ophthalmoplegia [Steele-Richardson-Olszewski]: Secondary | ICD-10-CM | POA: Diagnosis not present

## 2023-04-08 DIAGNOSIS — Z7189 Other specified counseling: Secondary | ICD-10-CM | POA: Diagnosis not present

## 2023-04-08 DIAGNOSIS — I10 Essential (primary) hypertension: Secondary | ICD-10-CM | POA: Diagnosis not present

## 2023-04-08 DIAGNOSIS — N1831 Chronic kidney disease, stage 3a: Secondary | ICD-10-CM | POA: Diagnosis not present

## 2023-04-08 DIAGNOSIS — Z9989 Dependence on other enabling machines and devices: Secondary | ICD-10-CM | POA: Diagnosis not present

## 2023-04-08 DIAGNOSIS — Z111 Encounter for screening for respiratory tuberculosis: Secondary | ICD-10-CM | POA: Diagnosis not present

## 2023-04-08 DIAGNOSIS — K219 Gastro-esophageal reflux disease without esophagitis: Secondary | ICD-10-CM | POA: Diagnosis not present

## 2023-04-08 DIAGNOSIS — Z794 Long term (current) use of insulin: Secondary | ICD-10-CM | POA: Diagnosis not present

## 2023-04-08 DIAGNOSIS — E782 Mixed hyperlipidemia: Secondary | ICD-10-CM | POA: Diagnosis not present

## 2023-04-08 DIAGNOSIS — E1122 Type 2 diabetes mellitus with diabetic chronic kidney disease: Secondary | ICD-10-CM | POA: Diagnosis not present

## 2023-04-08 DIAGNOSIS — F339 Major depressive disorder, recurrent, unspecified: Secondary | ICD-10-CM | POA: Diagnosis not present

## 2023-04-08 DIAGNOSIS — N400 Enlarged prostate without lower urinary tract symptoms: Secondary | ICD-10-CM | POA: Diagnosis not present

## 2023-04-22 DIAGNOSIS — I10 Essential (primary) hypertension: Secondary | ICD-10-CM | POA: Diagnosis not present

## 2023-04-22 DIAGNOSIS — E7800 Pure hypercholesterolemia, unspecified: Secondary | ICD-10-CM | POA: Diagnosis not present

## 2023-04-22 DIAGNOSIS — E1165 Type 2 diabetes mellitus with hyperglycemia: Secondary | ICD-10-CM | POA: Diagnosis not present

## 2023-04-28 DIAGNOSIS — W19XXXA Unspecified fall, initial encounter: Secondary | ICD-10-CM | POA: Diagnosis not present

## 2023-04-28 DIAGNOSIS — I1310 Hypertensive heart and chronic kidney disease without heart failure, with stage 1 through stage 4 chronic kidney disease, or unspecified chronic kidney disease: Secondary | ICD-10-CM | POA: Diagnosis not present

## 2023-04-28 DIAGNOSIS — N401 Enlarged prostate with lower urinary tract symptoms: Secondary | ICD-10-CM | POA: Diagnosis not present

## 2023-04-28 DIAGNOSIS — E785 Hyperlipidemia, unspecified: Secondary | ICD-10-CM | POA: Diagnosis not present

## 2023-04-28 DIAGNOSIS — N183 Chronic kidney disease, stage 3 unspecified: Secondary | ICD-10-CM | POA: Diagnosis not present

## 2023-04-28 DIAGNOSIS — K2100 Gastro-esophageal reflux disease with esophagitis, without bleeding: Secondary | ICD-10-CM | POA: Diagnosis not present

## 2023-04-28 DIAGNOSIS — E1122 Type 2 diabetes mellitus with diabetic chronic kidney disease: Secondary | ICD-10-CM | POA: Diagnosis not present

## 2023-04-28 DIAGNOSIS — F339 Major depressive disorder, recurrent, unspecified: Secondary | ICD-10-CM | POA: Diagnosis not present

## 2023-05-01 DIAGNOSIS — E559 Vitamin D deficiency, unspecified: Secondary | ICD-10-CM | POA: Diagnosis not present

## 2023-05-01 DIAGNOSIS — E119 Type 2 diabetes mellitus without complications: Secondary | ICD-10-CM | POA: Diagnosis not present

## 2023-05-01 DIAGNOSIS — Z79899 Other long term (current) drug therapy: Secondary | ICD-10-CM | POA: Diagnosis not present

## 2023-05-01 DIAGNOSIS — E039 Hypothyroidism, unspecified: Secondary | ICD-10-CM | POA: Diagnosis not present

## 2023-05-05 DIAGNOSIS — N39498 Other specified urinary incontinence: Secondary | ICD-10-CM | POA: Diagnosis not present

## 2023-05-05 DIAGNOSIS — F329 Major depressive disorder, single episode, unspecified: Secondary | ICD-10-CM | POA: Diagnosis not present

## 2023-05-05 DIAGNOSIS — S51812D Laceration without foreign body of left forearm, subsequent encounter: Secondary | ICD-10-CM | POA: Diagnosis not present

## 2023-05-05 DIAGNOSIS — N401 Enlarged prostate with lower urinary tract symptoms: Secondary | ICD-10-CM | POA: Diagnosis not present

## 2023-05-05 DIAGNOSIS — S41112D Laceration without foreign body of left upper arm, subsequent encounter: Secondary | ICD-10-CM | POA: Diagnosis not present

## 2023-05-05 DIAGNOSIS — Z7985 Long-term (current) use of injectable non-insulin antidiabetic drugs: Secondary | ICD-10-CM | POA: Diagnosis not present

## 2023-05-05 DIAGNOSIS — I129 Hypertensive chronic kidney disease with stage 1 through stage 4 chronic kidney disease, or unspecified chronic kidney disease: Secondary | ICD-10-CM | POA: Diagnosis not present

## 2023-05-05 DIAGNOSIS — E782 Mixed hyperlipidemia: Secondary | ICD-10-CM | POA: Diagnosis not present

## 2023-05-05 DIAGNOSIS — Z87891 Personal history of nicotine dependence: Secondary | ICD-10-CM | POA: Diagnosis not present

## 2023-05-05 DIAGNOSIS — M150 Primary generalized (osteo)arthritis: Secondary | ICD-10-CM | POA: Diagnosis not present

## 2023-05-05 DIAGNOSIS — H9193 Unspecified hearing loss, bilateral: Secondary | ICD-10-CM | POA: Diagnosis not present

## 2023-05-05 DIAGNOSIS — N183 Chronic kidney disease, stage 3 unspecified: Secondary | ICD-10-CM | POA: Diagnosis not present

## 2023-05-05 DIAGNOSIS — E119 Type 2 diabetes mellitus without complications: Secondary | ICD-10-CM | POA: Diagnosis not present

## 2023-05-05 DIAGNOSIS — R1310 Dysphagia, unspecified: Secondary | ICD-10-CM | POA: Diagnosis not present

## 2023-05-05 DIAGNOSIS — K219 Gastro-esophageal reflux disease without esophagitis: Secondary | ICD-10-CM | POA: Diagnosis not present

## 2023-05-05 DIAGNOSIS — S51811D Laceration without foreign body of right forearm, subsequent encounter: Secondary | ICD-10-CM | POA: Diagnosis not present

## 2023-05-05 DIAGNOSIS — Z794 Long term (current) use of insulin: Secondary | ICD-10-CM | POA: Diagnosis not present

## 2023-05-05 DIAGNOSIS — G231 Progressive supranuclear ophthalmoplegia [Steele-Richardson-Olszewski]: Secondary | ICD-10-CM | POA: Diagnosis not present

## 2023-05-05 DIAGNOSIS — Z8673 Personal history of transient ischemic attack (TIA), and cerebral infarction without residual deficits: Secondary | ICD-10-CM | POA: Diagnosis not present

## 2023-05-09 DIAGNOSIS — F329 Major depressive disorder, single episode, unspecified: Secondary | ICD-10-CM | POA: Diagnosis not present

## 2023-05-09 DIAGNOSIS — N1831 Chronic kidney disease, stage 3a: Secondary | ICD-10-CM | POA: Diagnosis not present

## 2023-05-09 DIAGNOSIS — E1122 Type 2 diabetes mellitus with diabetic chronic kidney disease: Secondary | ICD-10-CM | POA: Diagnosis not present

## 2023-05-09 DIAGNOSIS — I10 Essential (primary) hypertension: Secondary | ICD-10-CM | POA: Diagnosis not present

## 2023-05-14 DIAGNOSIS — I6381 Other cerebral infarction due to occlusion or stenosis of small artery: Secondary | ICD-10-CM | POA: Diagnosis not present

## 2023-05-14 DIAGNOSIS — K219 Gastro-esophageal reflux disease without esophagitis: Secondary | ICD-10-CM | POA: Diagnosis not present

## 2023-05-14 DIAGNOSIS — N183 Chronic kidney disease, stage 3 unspecified: Secondary | ICD-10-CM | POA: Diagnosis not present

## 2023-05-14 DIAGNOSIS — I10 Essential (primary) hypertension: Secondary | ICD-10-CM | POA: Diagnosis not present

## 2023-05-14 DIAGNOSIS — G231 Progressive supranuclear ophthalmoplegia [Steele-Richardson-Olszewski]: Secondary | ICD-10-CM | POA: Diagnosis not present

## 2023-05-14 DIAGNOSIS — Z515 Encounter for palliative care: Secondary | ICD-10-CM | POA: Diagnosis not present

## 2023-05-14 DIAGNOSIS — G3184 Mild cognitive impairment, so stated: Secondary | ICD-10-CM | POA: Diagnosis not present

## 2023-05-14 DIAGNOSIS — E1165 Type 2 diabetes mellitus with hyperglycemia: Secondary | ICD-10-CM | POA: Diagnosis not present

## 2023-05-14 DIAGNOSIS — E1129 Type 2 diabetes mellitus with other diabetic kidney complication: Secondary | ICD-10-CM | POA: Diagnosis not present

## 2023-05-14 DIAGNOSIS — R296 Repeated falls: Secondary | ICD-10-CM | POA: Diagnosis not present

## 2023-05-14 DIAGNOSIS — F329 Major depressive disorder, single episode, unspecified: Secondary | ICD-10-CM | POA: Diagnosis not present

## 2023-05-17 DIAGNOSIS — R051 Acute cough: Secondary | ICD-10-CM | POA: Diagnosis not present

## 2023-05-17 DIAGNOSIS — S51012A Laceration without foreign body of left elbow, initial encounter: Secondary | ICD-10-CM | POA: Diagnosis not present

## 2023-05-19 ENCOUNTER — Encounter (HOSPITAL_BASED_OUTPATIENT_CLINIC_OR_DEPARTMENT_OTHER): Payer: Self-pay | Admitting: Family

## 2023-05-19 ENCOUNTER — Ambulatory Visit (INDEPENDENT_AMBULATORY_CARE_PROVIDER_SITE_OTHER): Payer: HMO | Admitting: Family

## 2023-05-19 VITALS — BP 128/72 | HR 78 | Ht 69.0 in | Wt 187.0 lb

## 2023-05-19 DIAGNOSIS — I872 Venous insufficiency (chronic) (peripheral): Secondary | ICD-10-CM

## 2023-05-19 DIAGNOSIS — R600 Localized edema: Secondary | ICD-10-CM

## 2023-05-19 DIAGNOSIS — I493 Ventricular premature depolarization: Secondary | ICD-10-CM | POA: Diagnosis not present

## 2023-05-19 DIAGNOSIS — I10 Essential (primary) hypertension: Secondary | ICD-10-CM | POA: Diagnosis not present

## 2023-05-19 DIAGNOSIS — E782 Mixed hyperlipidemia: Secondary | ICD-10-CM

## 2023-05-19 MED ORDER — FUROSEMIDE 20 MG PO TABS: ORAL_TABLET | ORAL | 1 refills | Status: DC

## 2023-05-19 NOTE — Progress Notes (Signed)
Cardiology Office Note:  .   Date:  05/19/2023  ID:  Joel Webb, DOB Jan 21, 1946, MRN [id number] PCP: Tally Joe, MD  [facility] Providers Cardiologist:  None    History of Present Illness: .   Joel Webb is a 77 y.o. male with a history of hypertension, hyperlipidemia, CKD 3, diabetes mellitus, GERD, progressive supernuclear palsy (affecting walking, balance, eye movements, swallowing, and cognitive deficits)  ED visit 10/2021 near-syncope episode with prodrome of lightheadedness and general weakness.  EMS found him hypotensive in 70s.  Amlodipine and olmesartan had been recently increased.  He also noted poor p.o. intake that day.  Frequent PVCs by telemetry. Initial evaluation 01/2022 for posthospital follow-up of near-syncope.  Blood pressure is well-controlled on regimen of olmesartan and reduced dose amlodipine.  HCTZ had been discontinued.  He was referred to PREP exercise program.  He wore a monitor showing rare PAC/PVC and up to 6 beats of SVT.  Last seen 05/28/2022 by Dr. Duke Salvia.  BP at home averaging 137 over 80s.  No recurrent presyncopal episodes.  He was participating in PREP exercise program.  He presents today for follow-up with his wife. On 04/17/23 he moved into [facility] due to PSP. Facility staff inadvertently missed a few doses of antihypertensives with subsequent BP readings 179/101, 175/90,168/10/,172/111. BP readings have normalized since that time. PCP has recently discontinued hydrochlorothiazide due to 2 episodes of gout. Reports over the last month has had bilateral feet/ankle swelling.  Sits with legs in dependent position most of the time.  CXR Saturday was clear per his and his wife's report. No chest pain, orthopnea, PND, palpitations, lightheadedness, dizziness.  ROS: Please see the history of present illness.    All other systems reviewed and are negative.  Studies Reviewed: Marland Kitchen   EKG Interpretation Date/Time:  Monday May 19 2023  14:49:24 EDT Ventricular Rate:  78 PR Interval:  198 QRS Duration:  92 QT Interval:  386 QTC Calculation: 440 R Axis:   3  Text Interpretation: Normal sinus rhythm with frequent Premature ventricular complexes No acute ST/T wave changes.  Confirmed by Gillian Shields ([id number]) on 05/19/2023 2:55:38 PM    Cardiac Studies & Procedures         MONITORS  LONG TERM MONITOR (3-14 DAYS) 03/03/2022  Narrative 2 Day Zio Monitor  Quality: Fair.  Baseline artifact. Predominant rhythm: sinus rhythm First degree AV block Average heart rate: 75 bpm Max heart rate: 135 bpm Min heart rate: 51 bpm Pauses >2.5 seconds: none  Up to 6 beats SVT Rare PACs and PVCs Ventricular bigeminy and trigeminy   Tiffany C. Duke Salvia, MD, [facility] 03/03/2022 4:59 PM           Risk Assessment/Calculations:             Physical Exam:   VS:  BP 128/72   Pulse 78   Ht 5\' 9"  (1.753 m)   Wt 187 lb (84.8 kg)   BMI 27.62 kg/m    Wt Readings from Last 3 Encounters:  05/19/23 187 lb (84.8 kg)  01/08/23 178 lb (80.7 kg)  07/02/22 189 lb 4 oz (85.8 kg)    GEN: Well nourished, well developed in no acute distress NECK: No JVD; No carotid bruits CARDIAC: RRR, no murmurs, rubs, gallops RESPIRATORY:  Clear to auscultation without rales, wheezing or rhonchi  ABDOMEN: Soft, non-tender, non-distended EXTREMITIES:  Nonpitting L ankle edema; No deformity   ASSESSMENT AND PLAN: .    LE edema / Gout /  Venous insufficiency- L ankle with nonpitting edema. Reports bilateral LE have been intermittently swelling. Hydrochlorothiazide recently discontinued due to 2 gout flares, agree with plan to remain off. No dyspnea, orthopnea, PND suggestive of HF. Likely etiology venous insufficiency. Leg elevation, compression stockings, and low sodium diet encouraged. Will provide Rx for Lasix 20mg  to be taken as needed once per week for persistent edema. If edema worsens or develops new dyspnea, consider echocardiogram.  HTN - BP at  goal less than 130/80.  Continue amlodipine 5 mg daily, olmesartan 20 mg daily.   HLD - LDL goal <100, ideally <70. 12/2022 LDL 51. On Atorvastatin 80mg  and Zetia 10mg  every day.  Continue same.  PVC - Noted by EKG today.  Known prior diagnosis.  Unaware of PVC but no palpitations.  Prior monitor 02/2022 with PVC burden <1%.  Would recommend addition of beta-blocker only if symptomatic.  DM2 - Continue to follow with PCP.        Dispo: follow up in one year with Dr. Duke Salvia  Signed, Alver Sorrow, NP

## 2023-05-19 NOTE — Patient Instructions (Addendum)
Medication Instructions:  Your physician has recommended you make the following change in your medication:   Start: Lasix 20mg  tablet once per week as needed for swelling   *If you need a refill on your cardiac medications before your next appointment, please call your pharmacy*  Follow-Up: At [facility], you and your health needs are our priority.  As part of our continuing mission to provide you with exceptional heart care, we have created designated Provider Care Teams.  These Care Teams include your primary Cardiologist (physician) and Advanced Practice Providers (APPs -  Physician Assistants and Nurse Practitioners) who all work together to provide you with the care you need, when you need it.  We recommend signing up for the patient portal called "MyChart".  Sign up information is provided on this After Visit Summary.  MyChart is used to connect with patients for Virtual Visits (Telemedicine).  Patients are able to view lab/test results, encounter notes, upcoming appointments, etc.  Non-urgent messages can be sent to your provider as well.   To learn more about what you can do with MyChart, go to [email or web address].    Your next appointment:   1 year follow up with Dr. Duke Salvia   Other Instructions To prevent or reduce lower extremity swelling: Eat a low salt diet. Salt makes the body hold onto extra fluid which causes swelling. Sit with legs elevated. For example, in the recliner or on an ottoman.  Wear knee-high compression stockings during the daytime. Ones labeled 15-20 mmHg provide good compression.

## 2023-05-26 DIAGNOSIS — E1122 Type 2 diabetes mellitus with diabetic chronic kidney disease: Secondary | ICD-10-CM | POA: Diagnosis not present

## 2023-05-26 DIAGNOSIS — G231 Progressive supranuclear ophthalmoplegia [Steele-Richardson-Olszewski]: Secondary | ICD-10-CM | POA: Diagnosis not present

## 2023-05-26 DIAGNOSIS — S61401A Unspecified open wound of right hand, initial encounter: Secondary | ICD-10-CM | POA: Diagnosis not present

## 2023-05-26 DIAGNOSIS — N183 Chronic kidney disease, stage 3 unspecified: Secondary | ICD-10-CM | POA: Diagnosis not present

## 2023-05-26 DIAGNOSIS — I1310 Hypertensive heart and chronic kidney disease without heart failure, with stage 1 through stage 4 chronic kidney disease, or unspecified chronic kidney disease: Secondary | ICD-10-CM | POA: Diagnosis not present

## 2023-06-03 DIAGNOSIS — M109 Gout, unspecified: Secondary | ICD-10-CM | POA: Diagnosis not present

## 2023-06-03 DIAGNOSIS — E7800 Pure hypercholesterolemia, unspecified: Secondary | ICD-10-CM | POA: Diagnosis not present

## 2023-06-03 DIAGNOSIS — N189 Chronic kidney disease, unspecified: Secondary | ICD-10-CM | POA: Diagnosis not present

## 2023-06-03 DIAGNOSIS — E1165 Type 2 diabetes mellitus with hyperglycemia: Secondary | ICD-10-CM | POA: Diagnosis not present

## 2023-06-03 DIAGNOSIS — I10 Essential (primary) hypertension: Secondary | ICD-10-CM | POA: Diagnosis not present

## 2023-06-13 DIAGNOSIS — E785 Hyperlipidemia, unspecified: Secondary | ICD-10-CM | POA: Diagnosis not present

## 2023-06-13 DIAGNOSIS — F329 Major depressive disorder, single episode, unspecified: Secondary | ICD-10-CM | POA: Diagnosis not present

## 2023-06-16 DIAGNOSIS — G231 Progressive supranuclear ophthalmoplegia [Steele-Richardson-Olszewski]: Secondary | ICD-10-CM | POA: Diagnosis not present

## 2023-06-16 DIAGNOSIS — N183 Chronic kidney disease, stage 3 unspecified: Secondary | ICD-10-CM | POA: Diagnosis not present

## 2023-06-16 DIAGNOSIS — E1122 Type 2 diabetes mellitus with diabetic chronic kidney disease: Secondary | ICD-10-CM | POA: Diagnosis not present

## 2023-06-16 DIAGNOSIS — I1310 Hypertensive heart and chronic kidney disease without heart failure, with stage 1 through stage 4 chronic kidney disease, or unspecified chronic kidney disease: Secondary | ICD-10-CM | POA: Diagnosis not present

## 2023-06-25 DIAGNOSIS — F3341 Major depressive disorder, recurrent, in partial remission: Secondary | ICD-10-CM | POA: Diagnosis not present

## 2023-06-25 DIAGNOSIS — G231 Progressive supranuclear ophthalmoplegia [Steele-Richardson-Olszewski]: Secondary | ICD-10-CM | POA: Diagnosis not present

## 2023-06-25 DIAGNOSIS — N401 Enlarged prostate with lower urinary tract symptoms: Secondary | ICD-10-CM | POA: Diagnosis not present

## 2023-06-25 DIAGNOSIS — E1122 Type 2 diabetes mellitus with diabetic chronic kidney disease: Secondary | ICD-10-CM | POA: Diagnosis not present

## 2023-06-25 DIAGNOSIS — Z7985 Long-term (current) use of injectable non-insulin antidiabetic drugs: Secondary | ICD-10-CM | POA: Diagnosis not present

## 2023-06-25 DIAGNOSIS — Z794 Long term (current) use of insulin: Secondary | ICD-10-CM | POA: Diagnosis not present

## 2023-06-25 DIAGNOSIS — E785 Hyperlipidemia, unspecified: Secondary | ICD-10-CM | POA: Diagnosis not present

## 2023-06-25 DIAGNOSIS — I129 Hypertensive chronic kidney disease with stage 1 through stage 4 chronic kidney disease, or unspecified chronic kidney disease: Secondary | ICD-10-CM | POA: Diagnosis not present

## 2023-06-25 DIAGNOSIS — N183 Chronic kidney disease, stage 3 unspecified: Secondary | ICD-10-CM | POA: Diagnosis not present

## 2023-06-25 DIAGNOSIS — K219 Gastro-esophageal reflux disease without esophagitis: Secondary | ICD-10-CM | POA: Diagnosis not present

## 2023-06-30 DIAGNOSIS — E1129 Type 2 diabetes mellitus with other diabetic kidney complication: Secondary | ICD-10-CM | POA: Diagnosis not present

## 2023-06-30 DIAGNOSIS — E1165 Type 2 diabetes mellitus with hyperglycemia: Secondary | ICD-10-CM | POA: Diagnosis not present

## 2023-06-30 DIAGNOSIS — G3184 Mild cognitive impairment, so stated: Secondary | ICD-10-CM | POA: Diagnosis not present

## 2023-06-30 DIAGNOSIS — I6381 Other cerebral infarction due to occlusion or stenosis of small artery: Secondary | ICD-10-CM | POA: Diagnosis not present

## 2023-06-30 DIAGNOSIS — G231 Progressive supranuclear ophthalmoplegia [Steele-Richardson-Olszewski]: Secondary | ICD-10-CM | POA: Diagnosis not present

## 2023-06-30 DIAGNOSIS — R739 Hyperglycemia, unspecified: Secondary | ICD-10-CM | POA: Diagnosis not present

## 2023-06-30 DIAGNOSIS — Z515 Encounter for palliative care: Secondary | ICD-10-CM | POA: Diagnosis not present

## 2023-06-30 DIAGNOSIS — K219 Gastro-esophageal reflux disease without esophagitis: Secondary | ICD-10-CM | POA: Diagnosis not present

## 2023-06-30 DIAGNOSIS — N183 Chronic kidney disease, stage 3 unspecified: Secondary | ICD-10-CM | POA: Diagnosis not present

## 2023-06-30 DIAGNOSIS — I10 Essential (primary) hypertension: Secondary | ICD-10-CM | POA: Diagnosis not present

## 2023-06-30 DIAGNOSIS — R296 Repeated falls: Secondary | ICD-10-CM | POA: Diagnosis not present

## 2023-06-30 DIAGNOSIS — F329 Major depressive disorder, single episode, unspecified: Secondary | ICD-10-CM | POA: Diagnosis not present

## 2023-07-04 DIAGNOSIS — N183 Chronic kidney disease, stage 3 unspecified: Secondary | ICD-10-CM | POA: Diagnosis not present

## 2023-07-04 DIAGNOSIS — N401 Enlarged prostate with lower urinary tract symptoms: Secondary | ICD-10-CM | POA: Diagnosis not present

## 2023-07-04 DIAGNOSIS — S51812D Laceration without foreign body of left forearm, subsequent encounter: Secondary | ICD-10-CM | POA: Diagnosis not present

## 2023-07-04 DIAGNOSIS — M150 Primary generalized (osteo)arthritis: Secondary | ICD-10-CM | POA: Diagnosis not present

## 2023-07-04 DIAGNOSIS — I129 Hypertensive chronic kidney disease with stage 1 through stage 4 chronic kidney disease, or unspecified chronic kidney disease: Secondary | ICD-10-CM | POA: Diagnosis not present

## 2023-07-04 DIAGNOSIS — S41112D Laceration without foreign body of left upper arm, subsequent encounter: Secondary | ICD-10-CM | POA: Diagnosis not present

## 2023-07-04 DIAGNOSIS — E782 Mixed hyperlipidemia: Secondary | ICD-10-CM | POA: Diagnosis not present

## 2023-07-04 DIAGNOSIS — N39498 Other specified urinary incontinence: Secondary | ICD-10-CM | POA: Diagnosis not present

## 2023-07-04 DIAGNOSIS — E119 Type 2 diabetes mellitus without complications: Secondary | ICD-10-CM | POA: Diagnosis not present

## 2023-07-04 DIAGNOSIS — F329 Major depressive disorder, single episode, unspecified: Secondary | ICD-10-CM | POA: Diagnosis not present

## 2023-07-04 DIAGNOSIS — S51811D Laceration without foreign body of right forearm, subsequent encounter: Secondary | ICD-10-CM | POA: Diagnosis not present

## 2023-07-04 DIAGNOSIS — G231 Progressive supranuclear ophthalmoplegia [Steele-Richardson-Olszewski]: Secondary | ICD-10-CM | POA: Diagnosis not present

## 2023-07-11 DIAGNOSIS — G3184 Mild cognitive impairment, so stated: Secondary | ICD-10-CM | POA: Diagnosis not present

## 2023-07-11 DIAGNOSIS — F3341 Major depressive disorder, recurrent, in partial remission: Secondary | ICD-10-CM | POA: Diagnosis not present

## 2023-07-14 DIAGNOSIS — E1122 Type 2 diabetes mellitus with diabetic chronic kidney disease: Secondary | ICD-10-CM | POA: Diagnosis not present

## 2023-07-14 DIAGNOSIS — R600 Localized edema: Secondary | ICD-10-CM | POA: Diagnosis not present

## 2023-07-14 DIAGNOSIS — N183 Chronic kidney disease, stage 3 unspecified: Secondary | ICD-10-CM | POA: Diagnosis not present

## 2023-07-14 DIAGNOSIS — I129 Hypertensive chronic kidney disease with stage 1 through stage 4 chronic kidney disease, or unspecified chronic kidney disease: Secondary | ICD-10-CM | POA: Diagnosis not present

## 2023-07-14 DIAGNOSIS — Z794 Long term (current) use of insulin: Secondary | ICD-10-CM | POA: Diagnosis not present

## 2023-07-15 ENCOUNTER — Ambulatory Visit: Payer: PPO | Admitting: Physical Therapy

## 2023-07-15 ENCOUNTER — Ambulatory Visit: Payer: Self-pay | Admitting: Occupational Therapy

## 2023-07-15 ENCOUNTER — Ambulatory Visit: Payer: PPO | Admitting: Speech Pathology

## 2023-07-17 DIAGNOSIS — R296 Repeated falls: Secondary | ICD-10-CM | POA: Diagnosis not present

## 2023-07-17 DIAGNOSIS — G3184 Mild cognitive impairment, so stated: Secondary | ICD-10-CM | POA: Diagnosis not present

## 2023-07-17 DIAGNOSIS — I6381 Other cerebral infarction due to occlusion or stenosis of small artery: Secondary | ICD-10-CM | POA: Diagnosis not present

## 2023-07-17 DIAGNOSIS — S41112A Laceration without foreign body of left upper arm, initial encounter: Secondary | ICD-10-CM | POA: Diagnosis not present

## 2023-07-17 DIAGNOSIS — N183 Chronic kidney disease, stage 3 unspecified: Secondary | ICD-10-CM | POA: Diagnosis not present

## 2023-07-17 DIAGNOSIS — K219 Gastro-esophageal reflux disease without esophagitis: Secondary | ICD-10-CM | POA: Diagnosis not present

## 2023-07-17 DIAGNOSIS — F329 Major depressive disorder, single episode, unspecified: Secondary | ICD-10-CM | POA: Diagnosis not present

## 2023-07-17 DIAGNOSIS — G231 Progressive supranuclear ophthalmoplegia [Steele-Richardson-Olszewski]: Secondary | ICD-10-CM | POA: Diagnosis not present

## 2023-07-17 DIAGNOSIS — E1165 Type 2 diabetes mellitus with hyperglycemia: Secondary | ICD-10-CM | POA: Diagnosis not present

## 2023-07-17 DIAGNOSIS — I10 Essential (primary) hypertension: Secondary | ICD-10-CM | POA: Diagnosis not present

## 2023-07-17 DIAGNOSIS — E1129 Type 2 diabetes mellitus with other diabetic kidney complication: Secondary | ICD-10-CM | POA: Diagnosis not present

## 2023-07-17 DIAGNOSIS — E785 Hyperlipidemia, unspecified: Secondary | ICD-10-CM | POA: Diagnosis not present

## 2023-07-17 DIAGNOSIS — Z515 Encounter for palliative care: Secondary | ICD-10-CM | POA: Diagnosis not present

## 2023-07-22 ENCOUNTER — Other Ambulatory Visit: Payer: Self-pay | Admitting: Endocrinology

## 2023-07-22 DIAGNOSIS — E1165 Type 2 diabetes mellitus with hyperglycemia: Secondary | ICD-10-CM

## 2023-07-29 DIAGNOSIS — N400 Enlarged prostate without lower urinary tract symptoms: Secondary | ICD-10-CM | POA: Diagnosis not present

## 2023-07-29 DIAGNOSIS — Z794 Long term (current) use of insulin: Secondary | ICD-10-CM | POA: Diagnosis not present

## 2023-07-29 DIAGNOSIS — G231 Progressive supranuclear ophthalmoplegia [Steele-Richardson-Olszewski]: Secondary | ICD-10-CM | POA: Diagnosis not present

## 2023-07-29 DIAGNOSIS — E782 Mixed hyperlipidemia: Secondary | ICD-10-CM | POA: Diagnosis not present

## 2023-07-29 DIAGNOSIS — K219 Gastro-esophageal reflux disease without esophagitis: Secondary | ICD-10-CM | POA: Diagnosis not present

## 2023-07-29 DIAGNOSIS — I10 Essential (primary) hypertension: Secondary | ICD-10-CM | POA: Diagnosis not present

## 2023-07-29 DIAGNOSIS — Z9181 History of falling: Secondary | ICD-10-CM | POA: Diagnosis not present

## 2023-07-29 DIAGNOSIS — E1122 Type 2 diabetes mellitus with diabetic chronic kidney disease: Secondary | ICD-10-CM | POA: Diagnosis not present

## 2023-07-29 DIAGNOSIS — T148XXA Other injury of unspecified body region, initial encounter: Secondary | ICD-10-CM | POA: Diagnosis not present

## 2023-07-29 DIAGNOSIS — N1831 Chronic kidney disease, stage 3a: Secondary | ICD-10-CM | POA: Diagnosis not present

## 2023-07-29 DIAGNOSIS — F339 Major depressive disorder, recurrent, unspecified: Secondary | ICD-10-CM | POA: Diagnosis not present

## 2023-08-01 DIAGNOSIS — Z515 Encounter for palliative care: Secondary | ICD-10-CM | POA: Diagnosis not present

## 2023-08-01 DIAGNOSIS — I10 Essential (primary) hypertension: Secondary | ICD-10-CM | POA: Diagnosis not present

## 2023-08-01 DIAGNOSIS — K219 Gastro-esophageal reflux disease without esophagitis: Secondary | ICD-10-CM | POA: Diagnosis not present

## 2023-08-01 DIAGNOSIS — N183 Chronic kidney disease, stage 3 unspecified: Secondary | ICD-10-CM | POA: Diagnosis not present

## 2023-08-01 DIAGNOSIS — R296 Repeated falls: Secondary | ICD-10-CM | POA: Diagnosis not present

## 2023-08-01 DIAGNOSIS — S41112A Laceration without foreign body of left upper arm, initial encounter: Secondary | ICD-10-CM | POA: Diagnosis not present

## 2023-08-01 DIAGNOSIS — G3184 Mild cognitive impairment, so stated: Secondary | ICD-10-CM | POA: Diagnosis not present

## 2023-08-01 DIAGNOSIS — G231 Progressive supranuclear ophthalmoplegia [Steele-Richardson-Olszewski]: Secondary | ICD-10-CM | POA: Diagnosis not present

## 2023-08-01 DIAGNOSIS — E1165 Type 2 diabetes mellitus with hyperglycemia: Secondary | ICD-10-CM | POA: Diagnosis not present

## 2023-08-01 DIAGNOSIS — E1129 Type 2 diabetes mellitus with other diabetic kidney complication: Secondary | ICD-10-CM | POA: Diagnosis not present

## 2023-08-01 DIAGNOSIS — I6381 Other cerebral infarction due to occlusion or stenosis of small artery: Secondary | ICD-10-CM | POA: Diagnosis not present

## 2023-08-01 DIAGNOSIS — F329 Major depressive disorder, single episode, unspecified: Secondary | ICD-10-CM | POA: Diagnosis not present

## 2023-08-04 DIAGNOSIS — R296 Repeated falls: Secondary | ICD-10-CM | POA: Diagnosis not present

## 2023-08-04 DIAGNOSIS — S91115A Laceration without foreign body of left lesser toe(s) without damage to nail, initial encounter: Secondary | ICD-10-CM | POA: Diagnosis not present

## 2023-08-04 DIAGNOSIS — G231 Progressive supranuclear ophthalmoplegia [Steele-Richardson-Olszewski]: Secondary | ICD-10-CM | POA: Diagnosis not present

## 2023-08-06 ENCOUNTER — Other Ambulatory Visit: Payer: Self-pay

## 2023-08-06 ENCOUNTER — Emergency Department (HOSPITAL_BASED_OUTPATIENT_CLINIC_OR_DEPARTMENT_OTHER)
Admission: EM | Admit: 2023-08-06 | Discharge: 2023-08-06 | Disposition: A | Discharge status: Home / Self Care | Payer: HMO | Attending: Emergency Medicine | Admitting: Emergency Medicine

## 2023-08-06 DIAGNOSIS — S41112A Laceration without foreign body of left upper arm, initial encounter: Secondary | ICD-10-CM | POA: Insufficient documentation

## 2023-08-06 DIAGNOSIS — M79632 Pain in left forearm: Secondary | ICD-10-CM | POA: Diagnosis present

## 2023-08-06 DIAGNOSIS — S51812A Laceration without foreign body of left forearm, initial encounter: Secondary | ICD-10-CM | POA: Diagnosis not present

## 2023-08-06 DIAGNOSIS — W07XXXA Fall from chair, initial encounter: Secondary | ICD-10-CM | POA: Diagnosis not present

## 2023-08-06 DIAGNOSIS — W19XXXA Unspecified fall, initial encounter: Secondary | ICD-10-CM | POA: Diagnosis not present

## 2023-08-06 DIAGNOSIS — I10 Essential (primary) hypertension: Secondary | ICD-10-CM | POA: Diagnosis not present

## 2023-08-06 LAB — BASIC METABOLIC PANEL WITH GFR
Anion gap: 12 (ref 5–15)
BUN: 33 mg/dL — ABNORMAL HIGH (ref 8–23)
CO2: 18 mmol/L — ABNORMAL LOW (ref 22–32)
Calcium: 8.7 mg/dL — ABNORMAL LOW (ref 8.9–10.3)
Chloride: 102 mmol/L (ref 98–111)
Creatinine, Ser: 1.7 mg/dL — ABNORMAL HIGH (ref 0.61–1.24)
GFR, Estimated: 41 mL/min — ABNORMAL LOW (ref >60)
Glucose, Bld: 208 mg/dL — ABNORMAL HIGH (ref 70–99)
Potassium: 4.2 mmol/L (ref 3.5–5.1)
Sodium: 132 mmol/L — ABNORMAL LOW (ref 135–145)

## 2023-08-06 LAB — CBC
HCT: 38.4 % — ABNORMAL LOW (ref 39.0–52.0)
Hemoglobin: 13.3 g/dL (ref 13.0–17.0)
MCH: 29.8 pg (ref 26.0–34.0)
MCHC: 34.6 g/dL (ref 30.0–36.0)
MCV: 86.1 fL (ref 80.0–100.0)
Platelets: 141 10*3/uL — ABNORMAL LOW (ref 150–400)
RBC: 4.46 MIL/uL (ref 4.22–5.81)
RDW: 13.2 % (ref 11.5–15.5)
WBC: 9.5 10*3/uL (ref 4.0–10.5)
nRBC: 0 % (ref 0.0–0.2)

## 2023-08-06 LAB — CBG MONITORING, ED: Glucose-Capillary: 199 mg/dL — ABNORMAL HIGH (ref 70–99)

## 2023-08-06 NOTE — ED Provider Notes (Signed)
[place] EMERGENCY DEPARTMENT AT [facility] Provider Note   CSN: [id number] Arrival date & time: 08/06/23  1241     History Chief Complaint  Patient presents with  . Fall    HPI FARHAN LEHN is a 77 y.o. male presenting for fall at SNF. He is a 77 year old male.  He states that he was walking from the lunch table back to his room to sit in his chair when he sat in the chair and accidentally tipped all the way over the side.  Says he fell onto his left side. Denies fevers chills nausea vomiting syncope or shortness of breath. Patient's recorded medical, surgical, social, medication list and allergies were reviewed in the Snapshot window as part of the initial history.   Review of Systems   Review of Systems  Constitutional:  Negative for chills and fever.  HENT:  Negative for ear pain and sore throat.   Eyes:  Negative for pain and visual disturbance.  Respiratory:  Negative for cough and shortness of breath.   Cardiovascular:  Negative for chest pain and palpitations.  Gastrointestinal:  Negative for abdominal pain and vomiting.  Genitourinary:  Negative for dysuria and hematuria.  Musculoskeletal:  Negative for arthralgias and back pain.  Skin:  Negative for color change and rash.  Neurological:  Negative for seizures and syncope.  All other systems reviewed and are negative.   Physical Exam Updated Vital Signs BP 129/73   Pulse 100   Temp 98 F (36.7 C) (Oral)   Resp 18   SpO2 95%  Physical Exam Vitals and nursing note reviewed.  Constitutional:      General: He is not in acute distress.    Appearance: He is well-developed.  HENT:     Head: Normocephalic and atraumatic.  Eyes:     Conjunctiva/sclera: Conjunctivae normal.  Cardiovascular:     Rate and Rhythm: Normal rate and regular rhythm.     Heart sounds: No murmur heard. Pulmonary:     Effort: Pulmonary effort is normal. No respiratory distress.     Breath sounds: Normal breath sounds.   Abdominal:     Palpations: Abdomen is soft.     Tenderness: There is no abdominal tenderness.  Musculoskeletal:        General: Signs of injury (Substantial skin substantial skin tears over the left upper arm without obvious laceration.  He has full range of motion.) present. No swelling.     Cervical back: Neck supple.  Skin:    General: Skin is warm and dry.     Capillary Refill: Capillary refill takes less than 2 seconds.  Neurological:     Mental Status: He is alert.  Psychiatric:        Mood and Affect: Mood normal.     ED Course/ Medical Decision Making/ A&P    Procedures Procedures   Medications Ordered in ED Medications - No data to display Medical Decision Making:    QUOC KASEMAN is a 77 y.o. male who presented to the ED today with a moderate mechanisma trauma, detailed above.    Handoff received from EMS.  Additional history discussed with patient's family/caregivers.   Given this mechanism of trauma, a full physical exam was performed. Notably, patient was HDS in NAD.   Reviewed and confirmed nursing documentation for past medical history, family history, social history.    Initial Assessment/Plan:   This is a patient presenting with a moderate mechanism trauma.  As such, I have considered  intracranial injuries including intracranial hemorrhage, intrathoracic injuries including blunt myocardial or blunt lung injury, blunt abdominal injuries including aortic dissection, bladder injury, spleen injury, liver injury and I have considered orthopedic injuries including extremity or spinal injury.  With the patient's presentation of moderate mechanism trauma but with an otherwise reassuring exam, there is no indication for further objective evaluation at this time. Patient ambulating, tolerating PO intake and in no acute distress stable for continued OP care and management. Supportive care reinforced and patient is overall well appearing in no acute distress stable for  continued OP care and management.   Disposition:  I have considered need for hospitalization, however, considering all of the above, I believe this patient is stable for discharge at this time.  Patient/family educated about specific return precautions for given chief complaint and symptoms.  Patient/family educated about follow-up with PCP.     Patient/family expressed understanding of return precautions and need for follow-up. Patient spoken to regarding all imaging and laboratory results and appropriate follow up for these results. All education provided in verbal form with additional information in written form. Time was allowed for answering of patient questions. Patient discharged.    Emergency Department Medication Summary:   Medications - No data to display         Clinical Impression: No diagnosis found.   Data Unavailable   Final Clinical Impression(s) / ED Diagnoses Final diagnoses:  None    Rx / DC Orders ED Discharge Orders     None         Glyn Ade, MD 08/06/23 2313

## 2023-08-06 NOTE — ED Triage Notes (Signed)
Pt A+Ox4 in triage. [facility] resident. Reports baseline ROM in all extremities. Swelling seen in both feet- reports normal for him. Reports tripping at facility due to peripheral neuropathy. Head, neck, spine visually inspected and palpated- no abnormal findings. Skin tears to left forearm and upper arm- dressed in gauze by EMS. No thinners.

## 2023-08-06 NOTE — ED Triage Notes (Signed)
Per EMS, pt came from [facility] living independent living or ALF. EMS was called d/t pt falling from sitting position witnessed by staff causing him to get a skin tear to the L forearm and R leg. Both bandaged by EMS PTA. EMS advised pt did not hit his head, no LOC, and does not take blood thinner med. PMH neuropathy.   EMS VS 158/78 BP HR 62  CBG 202

## 2023-08-06 NOTE — ED Notes (Signed)
Cleaned skin tears to left forearm and left posterior upper arm. Both wounds dressed with xeroform, nonadherent pad and kling wrap. Patient tolerated well. Assisted patient to restroom and to vehicle per wheelchair. Discharge instructions family member and she will drive him back to the facility.

## 2023-08-07 DIAGNOSIS — G231 Progressive supranuclear ophthalmoplegia [Steele-Richardson-Olszewski]: Secondary | ICD-10-CM | POA: Diagnosis not present

## 2023-08-07 DIAGNOSIS — G3184 Mild cognitive impairment, so stated: Secondary | ICD-10-CM | POA: Diagnosis not present

## 2023-08-07 DIAGNOSIS — F3341 Major depressive disorder, recurrent, in partial remission: Secondary | ICD-10-CM | POA: Diagnosis not present

## 2023-08-07 DIAGNOSIS — S41112A Laceration without foreign body of left upper arm, initial encounter: Secondary | ICD-10-CM | POA: Diagnosis not present

## 2023-08-11 DIAGNOSIS — Z9181 History of falling: Secondary | ICD-10-CM | POA: Diagnosis not present

## 2023-08-11 DIAGNOSIS — S91115A Laceration without foreign body of left lesser toe(s) without damage to nail, initial encounter: Secondary | ICD-10-CM | POA: Diagnosis not present

## 2023-08-11 DIAGNOSIS — G231 Progressive supranuclear ophthalmoplegia [Steele-Richardson-Olszewski]: Secondary | ICD-10-CM | POA: Diagnosis not present

## 2023-08-17 DIAGNOSIS — E785 Hyperlipidemia, unspecified: Secondary | ICD-10-CM | POA: Diagnosis not present

## 2023-08-17 DIAGNOSIS — F329 Major depressive disorder, single episode, unspecified: Secondary | ICD-10-CM | POA: Diagnosis not present

## 2023-08-20 DIAGNOSIS — R269 Unspecified abnormalities of gait and mobility: Secondary | ICD-10-CM | POA: Diagnosis not present

## 2023-08-20 DIAGNOSIS — R296 Repeated falls: Secondary | ICD-10-CM | POA: Diagnosis not present

## 2023-08-25 DIAGNOSIS — Z9181 History of falling: Secondary | ICD-10-CM | POA: Diagnosis not present

## 2023-08-25 DIAGNOSIS — G231 Progressive supranuclear ophthalmoplegia [Steele-Richardson-Olszewski]: Secondary | ICD-10-CM | POA: Diagnosis not present

## 2023-08-25 DIAGNOSIS — N183 Chronic kidney disease, stage 3 unspecified: Secondary | ICD-10-CM | POA: Diagnosis not present

## 2023-08-25 DIAGNOSIS — E1122 Type 2 diabetes mellitus with diabetic chronic kidney disease: Secondary | ICD-10-CM | POA: Diagnosis not present

## 2023-08-25 DIAGNOSIS — I129 Hypertensive chronic kidney disease with stage 1 through stage 4 chronic kidney disease, or unspecified chronic kidney disease: Secondary | ICD-10-CM | POA: Diagnosis not present

## 2023-09-02 DIAGNOSIS — E1165 Type 2 diabetes mellitus with hyperglycemia: Secondary | ICD-10-CM | POA: Diagnosis not present

## 2023-09-04 DIAGNOSIS — F3341 Major depressive disorder, recurrent, in partial remission: Secondary | ICD-10-CM | POA: Diagnosis not present

## 2023-09-04 DIAGNOSIS — G3184 Mild cognitive impairment, so stated: Secondary | ICD-10-CM | POA: Diagnosis not present

## 2023-09-09 DIAGNOSIS — E1165 Type 2 diabetes mellitus with hyperglycemia: Secondary | ICD-10-CM | POA: Diagnosis not present

## 2023-09-09 DIAGNOSIS — M109 Gout, unspecified: Secondary | ICD-10-CM | POA: Diagnosis not present

## 2023-09-09 DIAGNOSIS — I10 Essential (primary) hypertension: Secondary | ICD-10-CM | POA: Diagnosis not present

## 2023-09-09 DIAGNOSIS — N189 Chronic kidney disease, unspecified: Secondary | ICD-10-CM | POA: Diagnosis not present

## 2023-09-09 DIAGNOSIS — E7800 Pure hypercholesterolemia, unspecified: Secondary | ICD-10-CM | POA: Diagnosis not present

## 2023-09-11 DIAGNOSIS — G231 Progressive supranuclear ophthalmoplegia [Steele-Richardson-Olszewski]: Secondary | ICD-10-CM | POA: Diagnosis not present

## 2023-09-11 DIAGNOSIS — E1165 Type 2 diabetes mellitus with hyperglycemia: Secondary | ICD-10-CM | POA: Diagnosis not present

## 2023-09-11 DIAGNOSIS — S41112A Laceration without foreign body of left upper arm, initial encounter: Secondary | ICD-10-CM | POA: Diagnosis not present

## 2023-09-11 DIAGNOSIS — Z515 Encounter for palliative care: Secondary | ICD-10-CM | POA: Diagnosis not present

## 2023-09-11 DIAGNOSIS — F329 Major depressive disorder, single episode, unspecified: Secondary | ICD-10-CM | POA: Diagnosis not present

## 2023-09-11 DIAGNOSIS — G3184 Mild cognitive impairment, so stated: Secondary | ICD-10-CM | POA: Diagnosis not present

## 2023-09-11 DIAGNOSIS — E1129 Type 2 diabetes mellitus with other diabetic kidney complication: Secondary | ICD-10-CM | POA: Diagnosis not present

## 2023-09-11 DIAGNOSIS — R296 Repeated falls: Secondary | ICD-10-CM | POA: Diagnosis not present

## 2023-09-11 DIAGNOSIS — N183 Chronic kidney disease, stage 3 unspecified: Secondary | ICD-10-CM | POA: Diagnosis not present

## 2023-09-11 DIAGNOSIS — I10 Essential (primary) hypertension: Secondary | ICD-10-CM | POA: Diagnosis not present

## 2023-09-11 DIAGNOSIS — I6381 Other cerebral infarction due to occlusion or stenosis of small artery: Secondary | ICD-10-CM | POA: Diagnosis not present

## 2023-09-11 DIAGNOSIS — K219 Gastro-esophageal reflux disease without esophagitis: Secondary | ICD-10-CM | POA: Diagnosis not present

## 2023-09-15 DIAGNOSIS — F329 Major depressive disorder, single episode, unspecified: Secondary | ICD-10-CM | POA: Diagnosis not present

## 2023-09-15 DIAGNOSIS — E1122 Type 2 diabetes mellitus with diabetic chronic kidney disease: Secondary | ICD-10-CM | POA: Diagnosis not present

## 2023-09-15 DIAGNOSIS — R600 Localized edema: Secondary | ICD-10-CM | POA: Diagnosis not present

## 2023-09-15 DIAGNOSIS — G231 Progressive supranuclear ophthalmoplegia [Steele-Richardson-Olszewski]: Secondary | ICD-10-CM | POA: Diagnosis not present

## 2023-09-15 DIAGNOSIS — N183 Chronic kidney disease, stage 3 unspecified: Secondary | ICD-10-CM | POA: Diagnosis not present

## 2023-09-15 DIAGNOSIS — E785 Hyperlipidemia, unspecified: Secondary | ICD-10-CM | POA: Diagnosis not present

## 2023-09-15 DIAGNOSIS — I129 Hypertensive chronic kidney disease with stage 1 through stage 4 chronic kidney disease, or unspecified chronic kidney disease: Secondary | ICD-10-CM | POA: Diagnosis not present

## 2023-09-20 DIAGNOSIS — R269 Unspecified abnormalities of gait and mobility: Secondary | ICD-10-CM | POA: Diagnosis not present

## 2023-09-20 DIAGNOSIS — R296 Repeated falls: Secondary | ICD-10-CM | POA: Diagnosis not present

## 2023-09-22 DIAGNOSIS — N183 Chronic kidney disease, stage 3 unspecified: Secondary | ICD-10-CM | POA: Diagnosis not present

## 2023-09-22 DIAGNOSIS — N401 Enlarged prostate with lower urinary tract symptoms: Secondary | ICD-10-CM | POA: Diagnosis not present

## 2023-09-22 DIAGNOSIS — G231 Progressive supranuclear ophthalmoplegia [Steele-Richardson-Olszewski]: Secondary | ICD-10-CM | POA: Diagnosis not present

## 2023-09-22 DIAGNOSIS — E1122 Type 2 diabetes mellitus with diabetic chronic kidney disease: Secondary | ICD-10-CM | POA: Diagnosis not present

## 2023-09-22 DIAGNOSIS — I129 Hypertensive chronic kidney disease with stage 1 through stage 4 chronic kidney disease, or unspecified chronic kidney disease: Secondary | ICD-10-CM | POA: Diagnosis not present

## 2023-09-22 DIAGNOSIS — N39498 Other specified urinary incontinence: Secondary | ICD-10-CM | POA: Diagnosis not present

## 2023-10-02 DIAGNOSIS — G3184 Mild cognitive impairment, so stated: Secondary | ICD-10-CM | POA: Diagnosis not present

## 2023-10-02 DIAGNOSIS — F3341 Major depressive disorder, recurrent, in partial remission: Secondary | ICD-10-CM | POA: Diagnosis not present

## 2023-10-02 DIAGNOSIS — Z9181 History of falling: Secondary | ICD-10-CM | POA: Diagnosis not present

## 2023-10-03 DIAGNOSIS — N183 Chronic kidney disease, stage 3 unspecified: Secondary | ICD-10-CM | POA: Diagnosis not present

## 2023-10-03 DIAGNOSIS — E1122 Type 2 diabetes mellitus with diabetic chronic kidney disease: Secondary | ICD-10-CM | POA: Diagnosis not present

## 2023-10-03 DIAGNOSIS — G231 Progressive supranuclear ophthalmoplegia [Steele-Richardson-Olszewski]: Secondary | ICD-10-CM | POA: Diagnosis not present

## 2023-10-03 DIAGNOSIS — I10 Essential (primary) hypertension: Secondary | ICD-10-CM | POA: Diagnosis not present

## 2023-10-03 DIAGNOSIS — R2689 Other abnormalities of gait and mobility: Secondary | ICD-10-CM | POA: Diagnosis not present

## 2023-10-03 DIAGNOSIS — Z9181 History of falling: Secondary | ICD-10-CM | POA: Diagnosis not present

## 2023-10-03 DIAGNOSIS — I129 Hypertensive chronic kidney disease with stage 1 through stage 4 chronic kidney disease, or unspecified chronic kidney disease: Secondary | ICD-10-CM | POA: Diagnosis not present

## 2023-10-13 DIAGNOSIS — E785 Hyperlipidemia, unspecified: Secondary | ICD-10-CM | POA: Diagnosis not present

## 2023-10-13 DIAGNOSIS — N183 Chronic kidney disease, stage 3 unspecified: Secondary | ICD-10-CM | POA: Diagnosis not present

## 2023-10-13 DIAGNOSIS — I129 Hypertensive chronic kidney disease with stage 1 through stage 4 chronic kidney disease, or unspecified chronic kidney disease: Secondary | ICD-10-CM | POA: Diagnosis not present

## 2023-10-13 DIAGNOSIS — E1122 Type 2 diabetes mellitus with diabetic chronic kidney disease: Secondary | ICD-10-CM | POA: Diagnosis not present

## 2023-10-14 DIAGNOSIS — F3341 Major depressive disorder, recurrent, in partial remission: Secondary | ICD-10-CM | POA: Diagnosis not present

## 2023-10-14 DIAGNOSIS — F329 Major depressive disorder, single episode, unspecified: Secondary | ICD-10-CM | POA: Diagnosis not present

## 2023-10-14 DIAGNOSIS — E785 Hyperlipidemia, unspecified: Secondary | ICD-10-CM | POA: Diagnosis not present

## 2023-10-14 DIAGNOSIS — E119 Type 2 diabetes mellitus without complications: Secondary | ICD-10-CM | POA: Diagnosis not present

## 2023-10-23 DIAGNOSIS — E559 Vitamin D deficiency, unspecified: Secondary | ICD-10-CM | POA: Diagnosis not present

## 2023-10-23 DIAGNOSIS — Z79899 Other long term (current) drug therapy: Secondary | ICD-10-CM | POA: Diagnosis not present

## 2023-10-23 DIAGNOSIS — E785 Hyperlipidemia, unspecified: Secondary | ICD-10-CM | POA: Diagnosis not present

## 2023-10-29 DIAGNOSIS — H1033 Unspecified acute conjunctivitis, bilateral: Secondary | ICD-10-CM | POA: Diagnosis not present

## 2023-10-30 DIAGNOSIS — G231 Progressive supranuclear ophthalmoplegia [Steele-Richardson-Olszewski]: Secondary | ICD-10-CM | POA: Diagnosis not present

## 2023-10-30 DIAGNOSIS — G3184 Mild cognitive impairment, so stated: Secondary | ICD-10-CM | POA: Diagnosis not present

## 2023-10-30 DIAGNOSIS — F3341 Major depressive disorder, recurrent, in partial remission: Secondary | ICD-10-CM | POA: Diagnosis not present

## 2023-10-31 DIAGNOSIS — R296 Repeated falls: Secondary | ICD-10-CM | POA: Diagnosis not present

## 2023-10-31 DIAGNOSIS — S41112A Laceration without foreign body of left upper arm, initial encounter: Secondary | ICD-10-CM | POA: Diagnosis not present

## 2023-10-31 DIAGNOSIS — E1165 Type 2 diabetes mellitus with hyperglycemia: Secondary | ICD-10-CM | POA: Diagnosis not present

## 2023-10-31 DIAGNOSIS — Z515 Encounter for palliative care: Secondary | ICD-10-CM | POA: Diagnosis not present

## 2023-10-31 DIAGNOSIS — E1129 Type 2 diabetes mellitus with other diabetic kidney complication: Secondary | ICD-10-CM | POA: Diagnosis not present

## 2023-10-31 DIAGNOSIS — B309 Viral conjunctivitis, unspecified: Secondary | ICD-10-CM | POA: Diagnosis not present

## 2023-10-31 DIAGNOSIS — G231 Progressive supranuclear ophthalmoplegia [Steele-Richardson-Olszewski]: Secondary | ICD-10-CM | POA: Diagnosis not present

## 2023-10-31 DIAGNOSIS — N183 Chronic kidney disease, stage 3 unspecified: Secondary | ICD-10-CM | POA: Diagnosis not present

## 2023-10-31 DIAGNOSIS — G3184 Mild cognitive impairment, so stated: Secondary | ICD-10-CM | POA: Diagnosis not present

## 2023-10-31 DIAGNOSIS — F329 Major depressive disorder, single episode, unspecified: Secondary | ICD-10-CM | POA: Diagnosis not present

## 2023-10-31 DIAGNOSIS — I10 Essential (primary) hypertension: Secondary | ICD-10-CM | POA: Diagnosis not present

## 2023-10-31 DIAGNOSIS — K219 Gastro-esophageal reflux disease without esophagitis: Secondary | ICD-10-CM | POA: Diagnosis not present

## 2023-11-02 DIAGNOSIS — G231 Progressive supranuclear ophthalmoplegia [Steele-Richardson-Olszewski]: Secondary | ICD-10-CM | POA: Diagnosis not present

## 2023-11-02 DIAGNOSIS — E1122 Type 2 diabetes mellitus with diabetic chronic kidney disease: Secondary | ICD-10-CM | POA: Diagnosis not present

## 2023-11-02 DIAGNOSIS — R2689 Other abnormalities of gait and mobility: Secondary | ICD-10-CM | POA: Diagnosis not present

## 2023-11-02 DIAGNOSIS — I10 Essential (primary) hypertension: Secondary | ICD-10-CM | POA: Diagnosis not present

## 2023-11-02 DIAGNOSIS — Z9181 History of falling: Secondary | ICD-10-CM | POA: Diagnosis not present

## 2023-11-03 DIAGNOSIS — G809 Cerebral palsy, unspecified: Secondary | ICD-10-CM | POA: Diagnosis not present

## 2023-11-03 DIAGNOSIS — R2689 Other abnormalities of gait and mobility: Secondary | ICD-10-CM | POA: Diagnosis not present

## 2023-11-03 DIAGNOSIS — N189 Chronic kidney disease, unspecified: Secondary | ICD-10-CM | POA: Diagnosis not present

## 2023-11-03 DIAGNOSIS — M6281 Muscle weakness (generalized): Secondary | ICD-10-CM | POA: Diagnosis not present

## 2023-11-03 DIAGNOSIS — B351 Tinea unguium: Secondary | ICD-10-CM | POA: Diagnosis not present

## 2023-11-03 DIAGNOSIS — R262 Difficulty in walking, not elsewhere classified: Secondary | ICD-10-CM | POA: Diagnosis not present

## 2023-11-03 DIAGNOSIS — E785 Hyperlipidemia, unspecified: Secondary | ICD-10-CM | POA: Diagnosis not present

## 2023-11-03 DIAGNOSIS — F339 Major depressive disorder, recurrent, unspecified: Secondary | ICD-10-CM | POA: Diagnosis not present

## 2023-11-03 DIAGNOSIS — I10 Essential (primary) hypertension: Secondary | ICD-10-CM | POA: Diagnosis not present

## 2023-11-03 DIAGNOSIS — I872 Venous insufficiency (chronic) (peripheral): Secondary | ICD-10-CM | POA: Diagnosis not present

## 2023-11-03 DIAGNOSIS — E119 Type 2 diabetes mellitus without complications: Secondary | ICD-10-CM | POA: Diagnosis not present

## 2023-11-03 DIAGNOSIS — R238 Other skin changes: Secondary | ICD-10-CM | POA: Diagnosis not present

## 2023-11-07 DIAGNOSIS — E119 Type 2 diabetes mellitus without complications: Secondary | ICD-10-CM | POA: Diagnosis not present

## 2023-11-07 DIAGNOSIS — E785 Hyperlipidemia, unspecified: Secondary | ICD-10-CM | POA: Diagnosis not present

## 2023-11-10 DIAGNOSIS — N183 Chronic kidney disease, stage 3 unspecified: Secondary | ICD-10-CM | POA: Diagnosis not present

## 2023-11-10 DIAGNOSIS — I129 Hypertensive chronic kidney disease with stage 1 through stage 4 chronic kidney disease, or unspecified chronic kidney disease: Secondary | ICD-10-CM | POA: Diagnosis not present

## 2023-11-10 DIAGNOSIS — E1122 Type 2 diabetes mellitus with diabetic chronic kidney disease: Secondary | ICD-10-CM | POA: Diagnosis not present

## 2023-11-10 DIAGNOSIS — E785 Hyperlipidemia, unspecified: Secondary | ICD-10-CM | POA: Diagnosis not present

## 2024-01-07 DIAGNOSIS — W19XXXA Unspecified fall, initial encounter: Secondary | ICD-10-CM | POA: Diagnosis not present

## 2024-01-07 DIAGNOSIS — S2020XA Contusion of thorax, unspecified, initial encounter: Secondary | ICD-10-CM | POA: Diagnosis not present

## 2024-01-13 DIAGNOSIS — F3341 Major depressive disorder, recurrent, in partial remission: Secondary | ICD-10-CM | POA: Diagnosis not present

## 2024-01-13 DIAGNOSIS — N401 Enlarged prostate with lower urinary tract symptoms: Secondary | ICD-10-CM | POA: Diagnosis not present

## 2024-01-15 DIAGNOSIS — Z7721 Contact with and (suspected) exposure to potentially hazardous body fluids: Secondary | ICD-10-CM | POA: Diagnosis not present

## 2024-01-19 DIAGNOSIS — R4182 Altered mental status, unspecified: Secondary | ICD-10-CM | POA: Diagnosis not present

## 2024-01-22 DIAGNOSIS — G231 Progressive supranuclear ophthalmoplegia [Steele-Richardson-Olszewski]: Secondary | ICD-10-CM | POA: Diagnosis not present

## 2024-01-22 DIAGNOSIS — G3184 Mild cognitive impairment, so stated: Secondary | ICD-10-CM | POA: Diagnosis not present

## 2024-01-22 DIAGNOSIS — F3341 Major depressive disorder, recurrent, in partial remission: Secondary | ICD-10-CM | POA: Diagnosis not present

## 2024-01-28 DIAGNOSIS — R1311 Dysphagia, oral phase: Secondary | ICD-10-CM | POA: Diagnosis not present

## 2024-01-28 DIAGNOSIS — R296 Repeated falls: Secondary | ICD-10-CM | POA: Diagnosis not present

## 2024-01-28 DIAGNOSIS — G3184 Mild cognitive impairment, so stated: Secondary | ICD-10-CM | POA: Diagnosis not present

## 2024-01-28 DIAGNOSIS — F329 Major depressive disorder, single episode, unspecified: Secondary | ICD-10-CM | POA: Diagnosis not present

## 2024-01-28 DIAGNOSIS — K219 Gastro-esophageal reflux disease without esophagitis: Secondary | ICD-10-CM | POA: Diagnosis not present

## 2024-01-28 DIAGNOSIS — S41112A Laceration without foreign body of left upper arm, initial encounter: Secondary | ICD-10-CM | POA: Diagnosis not present

## 2024-01-28 DIAGNOSIS — N183 Chronic kidney disease, stage 3 unspecified: Secondary | ICD-10-CM | POA: Diagnosis not present

## 2024-01-28 DIAGNOSIS — B309 Viral conjunctivitis, unspecified: Secondary | ICD-10-CM | POA: Diagnosis not present

## 2024-01-28 DIAGNOSIS — Z515 Encounter for palliative care: Secondary | ICD-10-CM | POA: Diagnosis not present

## 2024-01-28 DIAGNOSIS — I10 Essential (primary) hypertension: Secondary | ICD-10-CM | POA: Diagnosis not present

## 2024-01-28 DIAGNOSIS — E1165 Type 2 diabetes mellitus with hyperglycemia: Secondary | ICD-10-CM | POA: Diagnosis not present

## 2024-01-28 DIAGNOSIS — G231 Progressive supranuclear ophthalmoplegia [Steele-Richardson-Olszewski]: Secondary | ICD-10-CM | POA: Diagnosis not present

## 2024-01-28 DIAGNOSIS — E1122 Type 2 diabetes mellitus with diabetic chronic kidney disease: Secondary | ICD-10-CM | POA: Diagnosis not present

## 2024-01-28 DIAGNOSIS — E1129 Type 2 diabetes mellitus with other diabetic kidney complication: Secondary | ICD-10-CM | POA: Diagnosis not present

## 2024-01-31 DIAGNOSIS — R2689 Other abnormalities of gait and mobility: Secondary | ICD-10-CM | POA: Diagnosis not present

## 2024-01-31 DIAGNOSIS — G231 Progressive supranuclear ophthalmoplegia [Steele-Richardson-Olszewski]: Secondary | ICD-10-CM | POA: Diagnosis not present

## 2024-01-31 DIAGNOSIS — E1122 Type 2 diabetes mellitus with diabetic chronic kidney disease: Secondary | ICD-10-CM | POA: Diagnosis not present

## 2024-01-31 DIAGNOSIS — I10 Essential (primary) hypertension: Secondary | ICD-10-CM | POA: Diagnosis not present

## 2024-01-31 DIAGNOSIS — Z9181 History of falling: Secondary | ICD-10-CM | POA: Diagnosis not present

## 2024-02-03 DIAGNOSIS — I130 Hypertensive heart and chronic kidney disease with heart failure and stage 1 through stage 4 chronic kidney disease, or unspecified chronic kidney disease: Secondary | ICD-10-CM | POA: Diagnosis not present

## 2024-02-03 DIAGNOSIS — E1122 Type 2 diabetes mellitus with diabetic chronic kidney disease: Secondary | ICD-10-CM | POA: Diagnosis not present

## 2024-02-03 DIAGNOSIS — N182 Chronic kidney disease, stage 2 (mild): Secondary | ICD-10-CM | POA: Diagnosis not present

## 2024-02-06 DIAGNOSIS — N183 Chronic kidney disease, stage 3 unspecified: Secondary | ICD-10-CM | POA: Diagnosis not present

## 2024-02-06 DIAGNOSIS — F329 Major depressive disorder, single episode, unspecified: Secondary | ICD-10-CM | POA: Diagnosis not present

## 2024-02-06 DIAGNOSIS — G3184 Mild cognitive impairment, so stated: Secondary | ICD-10-CM | POA: Diagnosis not present

## 2024-02-06 DIAGNOSIS — E782 Mixed hyperlipidemia: Secondary | ICD-10-CM | POA: Diagnosis not present

## 2024-02-06 DIAGNOSIS — Z87440 Personal history of urinary (tract) infections: Secondary | ICD-10-CM | POA: Diagnosis not present

## 2024-02-06 DIAGNOSIS — E1122 Type 2 diabetes mellitus with diabetic chronic kidney disease: Secondary | ICD-10-CM | POA: Diagnosis not present

## 2024-02-06 DIAGNOSIS — Z7985 Long-term (current) use of injectable non-insulin antidiabetic drugs: Secondary | ICD-10-CM | POA: Diagnosis not present

## 2024-02-06 DIAGNOSIS — Z9181 History of falling: Secondary | ICD-10-CM | POA: Diagnosis not present

## 2024-02-06 DIAGNOSIS — N39498 Other specified urinary incontinence: Secondary | ICD-10-CM | POA: Diagnosis not present

## 2024-02-06 DIAGNOSIS — I493 Ventricular premature depolarization: Secondary | ICD-10-CM | POA: Diagnosis not present

## 2024-02-06 DIAGNOSIS — R1312 Dysphagia, oropharyngeal phase: Secondary | ICD-10-CM | POA: Diagnosis not present

## 2024-02-06 DIAGNOSIS — Z794 Long term (current) use of insulin: Secondary | ICD-10-CM | POA: Diagnosis not present

## 2024-02-06 DIAGNOSIS — W19XXXD Unspecified fall, subsequent encounter: Secondary | ICD-10-CM | POA: Diagnosis not present

## 2024-02-06 DIAGNOSIS — N401 Enlarged prostate with lower urinary tract symptoms: Secondary | ICD-10-CM | POA: Diagnosis not present

## 2024-02-06 DIAGNOSIS — Z87891 Personal history of nicotine dependence: Secondary | ICD-10-CM | POA: Diagnosis not present

## 2024-02-06 DIAGNOSIS — K219 Gastro-esophageal reflux disease without esophagitis: Secondary | ICD-10-CM | POA: Diagnosis not present

## 2024-02-06 DIAGNOSIS — I129 Hypertensive chronic kidney disease with stage 1 through stage 4 chronic kidney disease, or unspecified chronic kidney disease: Secondary | ICD-10-CM | POA: Diagnosis not present

## 2024-02-06 DIAGNOSIS — S41102D Unspecified open wound of left upper arm, subsequent encounter: Secondary | ICD-10-CM | POA: Diagnosis not present

## 2024-02-06 DIAGNOSIS — G231 Progressive supranuclear ophthalmoplegia [Steele-Richardson-Olszewski]: Secondary | ICD-10-CM | POA: Diagnosis not present

## 2024-02-10 DIAGNOSIS — N182 Chronic kidney disease, stage 2 (mild): Secondary | ICD-10-CM | POA: Diagnosis not present

## 2024-02-10 DIAGNOSIS — I130 Hypertensive heart and chronic kidney disease with heart failure and stage 1 through stage 4 chronic kidney disease, or unspecified chronic kidney disease: Secondary | ICD-10-CM | POA: Diagnosis not present

## 2024-02-17 DIAGNOSIS — G231 Progressive supranuclear ophthalmoplegia [Steele-Richardson-Olszewski]: Secondary | ICD-10-CM | POA: Diagnosis not present

## 2024-02-17 DIAGNOSIS — F3341 Major depressive disorder, recurrent, in partial remission: Secondary | ICD-10-CM | POA: Diagnosis not present

## 2024-02-17 DIAGNOSIS — G3184 Mild cognitive impairment, so stated: Secondary | ICD-10-CM | POA: Diagnosis not present

## 2024-02-23 DIAGNOSIS — R296 Repeated falls: Secondary | ICD-10-CM | POA: Diagnosis not present

## 2024-02-23 DIAGNOSIS — E1122 Type 2 diabetes mellitus with diabetic chronic kidney disease: Secondary | ICD-10-CM | POA: Diagnosis not present

## 2024-02-23 DIAGNOSIS — R1311 Dysphagia, oral phase: Secondary | ICD-10-CM | POA: Diagnosis not present

## 2024-02-23 DIAGNOSIS — N182 Chronic kidney disease, stage 2 (mild): Secondary | ICD-10-CM | POA: Diagnosis not present

## 2024-02-23 DIAGNOSIS — I130 Hypertensive heart and chronic kidney disease with heart failure and stage 1 through stage 4 chronic kidney disease, or unspecified chronic kidney disease: Secondary | ICD-10-CM | POA: Diagnosis not present

## 2024-02-24 DIAGNOSIS — F331 Major depressive disorder, recurrent, moderate: Secondary | ICD-10-CM | POA: Diagnosis not present

## 2024-02-24 DIAGNOSIS — F329 Major depressive disorder, single episode, unspecified: Secondary | ICD-10-CM | POA: Diagnosis not present

## 2024-03-02 ENCOUNTER — Emergency Department (HOSPITAL_COMMUNITY)

## 2024-03-02 ENCOUNTER — Other Ambulatory Visit: Payer: Self-pay

## 2024-03-02 ENCOUNTER — Emergency Department (HOSPITAL_COMMUNITY)
Admission: EM | Admit: 2024-03-02 | Discharge: 2024-03-02 | Disposition: A | Discharge status: Skilled Nursing Facility | Attending: Emergency Medicine | Admitting: Emergency Medicine

## 2024-03-02 ENCOUNTER — Encounter (HOSPITAL_COMMUNITY): Payer: Self-pay | Admitting: *Deleted

## 2024-03-02 DIAGNOSIS — E1122 Type 2 diabetes mellitus with diabetic chronic kidney disease: Secondary | ICD-10-CM | POA: Insufficient documentation

## 2024-03-02 DIAGNOSIS — I129 Hypertensive chronic kidney disease with stage 1 through stage 4 chronic kidney disease, or unspecified chronic kidney disease: Secondary | ICD-10-CM | POA: Diagnosis not present

## 2024-03-02 DIAGNOSIS — M542 Cervicalgia: Secondary | ICD-10-CM | POA: Diagnosis not present

## 2024-03-02 DIAGNOSIS — W010XXA Fall on same level from slipping, tripping and stumbling without subsequent striking against object, initial encounter: Secondary | ICD-10-CM | POA: Insufficient documentation

## 2024-03-02 DIAGNOSIS — S0990XA Unspecified injury of head, initial encounter: Secondary | ICD-10-CM | POA: Diagnosis not present

## 2024-03-02 DIAGNOSIS — R2689 Other abnormalities of gait and mobility: Secondary | ICD-10-CM | POA: Diagnosis not present

## 2024-03-02 DIAGNOSIS — G231 Progressive supranuclear ophthalmoplegia [Steele-Richardson-Olszewski]: Secondary | ICD-10-CM | POA: Diagnosis not present

## 2024-03-02 DIAGNOSIS — J90 Pleural effusion, not elsewhere classified: Secondary | ICD-10-CM | POA: Diagnosis not present

## 2024-03-02 DIAGNOSIS — N183 Chronic kidney disease, stage 3 unspecified: Secondary | ICD-10-CM | POA: Insufficient documentation

## 2024-03-02 DIAGNOSIS — W19XXXA Unspecified fall, initial encounter: Secondary | ICD-10-CM | POA: Diagnosis not present

## 2024-03-02 DIAGNOSIS — R918 Other nonspecific abnormal finding of lung field: Secondary | ICD-10-CM | POA: Diagnosis not present

## 2024-03-02 DIAGNOSIS — I6782 Cerebral ischemia: Secondary | ICD-10-CM | POA: Diagnosis not present

## 2024-03-02 DIAGNOSIS — M47812 Spondylosis without myelopathy or radiculopathy, cervical region: Secondary | ICD-10-CM | POA: Diagnosis not present

## 2024-03-02 DIAGNOSIS — I10 Essential (primary) hypertension: Secondary | ICD-10-CM | POA: Diagnosis not present

## 2024-03-02 DIAGNOSIS — Z9181 History of falling: Secondary | ICD-10-CM | POA: Diagnosis not present

## 2024-03-02 MED ORDER — FUROSEMIDE 40 MG PO TABS
40.0000 mg | ORAL_TABLET | Freq: Once | ORAL | Status: AC
  Administered 2024-03-02: 40 mg via ORAL
  Filled 2024-03-02: qty 1

## 2024-03-02 NOTE — Discharge Instructions (Signed)
 Please take 20mg  of Lasix for the next 7 days.  Please follow up with your cardiologist.

## 2024-03-02 NOTE — ED Triage Notes (Addendum)
 BIB EMS from [facility] due to fall without reported injury but wants to be checked out. Slipped and fell, assisted up by staff, sitting at computer when EMS arrived. Hit head, no LOC or Thinners. 130/78-96-98% CBG 131

## 2024-03-02 NOTE — ED Provider Notes (Signed)
 [place] EMERGENCY DEPARTMENT AT [facility] Provider Note   CSN: [id number] Arrival date & time: 03/02/24  7829     History  Chief Complaint  Patient presents with   Joel Webb is a 78 y.o. male with medical history to include PVCs, GERD, chronic kidney disease stage III, hypertension, type 2 diabetes, frequent falls, gait abnormality, mild cognitive impairment of uncertain unknown etiology.  Patient presents to ED with his wife for evaluation of fall.  The patient apparently suffers from frequent falls secondary to his PSP.  Per his wife, patient apparently slipped and fell earlier today and struck his head.  He denies losing consciousness, denies blood thinners.  He reports he was on the ground for 30 seconds before being helped up to his feet by staff.  When EMS arrived, the patient was sitting at the computer.  Patient denies any loss of consciousness, neck pain, headache, nausea, vomiting, lightheadedness, dizziness or weakness.  He denies blood thinners.  He is here with his wife who is concerned and feels as if the patient is to receive imaging to ensure that there is no underlying injury.  Patient wife is also concerned about CBG however CBG is 138.  Patient neurostatus is at baseline per his wife.  Denies any other concerns.   Fall       Home Medications     Allergies    Patient has no known allergies.    Review of Systems   Review of Systems  All other systems reviewed and are negative.   Physical Exam Updated Vital Signs BP (!) 143/114 (BP Location: Right Arm)   Pulse 92   Temp 97.7 F (36.5 C) (Oral)   Resp 16   Ht 5\' 10"  (1.778 m)   Wt 79.4 kg   SpO2 96%   BMI 25.11 kg/m  Physical Exam Vitals and nursing note reviewed.  Constitutional:      General: He is not in acute distress.    Appearance: He is well-developed.  HENT:     Head: Normocephalic and atraumatic.     Comments: No external signs of trauma to patient  head Eyes:     Conjunctiva/sclera: Conjunctivae normal.  Neck:     Comments: No cervical spinal tenderness.  No step-off or crepitus. Cardiovascular:     Rate and Rhythm: Normal rate and regular rhythm.     Heart sounds: No murmur heard. Pulmonary:     Effort: Pulmonary effort is normal. No respiratory distress.     Breath sounds: Normal breath sounds.  Abdominal:     Palpations: Abdomen is soft.     Tenderness: There is no abdominal tenderness.  Musculoskeletal:        General: No swelling.     Cervical back: Neck supple.  Skin:    General: Skin is warm and dry.     Capillary Refill: Capillary refill takes less than 2 seconds.  Neurological:     General: No focal deficit present.     Mental Status: He is alert and oriented to person, place, and time.     GCS: GCS eye subscore is 4. GCS verbal subscore is 5. GCS motor subscore is 6.     Cranial Nerves: Cranial nerves 2-12 are intact. No cranial nerve deficit.     Sensory: Sensation is intact. No sensory deficit.     Motor: Motor function is intact. No weakness.     Comments: Mental status at baseline.  Patient follows commands appropriately.  5 out of 5 strength bilateral upper extremities.  5 out of 5 strength bilateral lower extremities.  Psychiatric:        Mood and Affect: Mood normal.     ED Results / Procedures / Treatments   Labs (all labs ordered are listed, but only abnormal results are displayed) Labs Reviewed - No data to display  EKG None  Radiology DG Chest 2 View Result Date: 03/02/2024 CLINICAL DATA:  Pleural effusion EXAM: CHEST - 2 VIEW COMPARISON:  05/17/2023 FINDINGS: Bilateral perihilar and lower lung zone pulmonary infiltrates have developed, more focal at the left lung base and small bilateral pleural effusions have developed most suggestive of mild cardiogenic failure. No pneumothorax. Cardiac size within normal limits. No acute bone abnormality. IMPRESSION: 1. Interval development of mild cardiogenic  failure with small bilateral pleural effusions. Electronically Signed   By: Helyn Numbers M.D.   On: 03/02/2024 22:19   CT Head Wo Contrast Result Date: 03/02/2024 CLINICAL DATA:  Head trauma EXAM: CT HEAD WITHOUT CONTRAST TECHNIQUE: Contiguous axial images were obtained from the base of the skull through the vertex without intravenous contrast. RADIATION DOSE REDUCTION: This exam was performed according to the departmental dose-optimization program which includes automated exposure control, adjustment of the mA and/or kV according to patient size and/or use of iterative reconstruction technique. COMPARISON:  Head CT 03/13/2022. FINDINGS: Brain: No evidence of acute infarction, hemorrhage, hydrocephalus, extra-axial collection or mass lesion/mass effect. There is moderate diffuse atrophy. There is mild periventricular white matter hypodensity, likely chronic small vessel ischemic change. There is an old lacunar infarct in the right corona. Vascular: Atherosclerotic calcifications are present within the cavernous internal carotid arteries. Skull: Normal. Negative for fracture or focal lesion. Sinuses/Orbits: No acute finding. Other: None. IMPRESSION: 1. No acute intracranial process. 2. Moderate diffuse atrophy and mild chronic small vessel ischemic change. Electronically Signed   By: Darliss Cheney M.D.   On: 03/02/2024 21:53   CT Cervical Spine Wo Contrast Result Date: 03/02/2024 CLINICAL DATA:  Recent fall with neck pain, initial encounter EXAM: CT CERVICAL SPINE WITHOUT CONTRAST TECHNIQUE: Multidetector CT imaging of the cervical spine was performed without intravenous contrast. Multiplanar CT image reconstructions were also generated. RADIATION DOSE REDUCTION: This exam was performed according to the departmental dose-optimization program which includes automated exposure control, adjustment of the mA and/or kV according to patient size and/or use of iterative reconstruction technique. COMPARISON:  None  Available. FINDINGS: Alignment: Within normal limits. Skull base and vertebrae: 7 cervical segments are well visualized. Vertebral body height is well maintained. Mild facet hypertrophic changes are noted. Mild osteophytic changes are seen as well. No acute fracture or acute facet abnormality is noted. The odontoid is within normal limits. Soft tissues and spinal canal: Surrounding soft tissue structures are within normal limits. Upper chest: There is opacification of the right lung apex which may represent a pleural effusion. Chest x-ray is recommended for further evaluation. Other: None IMPRESSION: Multilevel degenerative change without acute abnormality. Findings suspicious for right pleural effusion. Chest x-ray may be helpful for further evaluation. Electronically Signed   By: Alcide Clever M.D.   On: 03/02/2024 21:50    Procedures Procedures   Medications Ordered in ED Medications - No data to display  ED Course/ Medical Decision Making/ A&P  Medical Decision Making Amount and/or Complexity of Data Reviewed Radiology: ordered.   78 year old male presents for evaluation.  Please see HPI for further details.  On examination patient is afebrile  and nontachycardic.  Lung sounds are clear bilaterally, not hypoxic.  Abdomen soft and compressible.  Neurological examinations at baseline.  No tenderness to cervical spine.  Overall nontoxic in appearance.  No signs of trauma the patient had.  Will collect CT cervical, CT head.  CT head unremarkable.  CT cervical negative however does show evidence of right pleural effusion.  Will collect chest x-ray to further evaluate.  Patient signed out to [employer] PA-C pending x-ray.   Final Clinical Impression(s) / ED Diagnoses Final diagnoses:  Fall, initial encounter    Rx / DC Orders ED Discharge Orders     None         Kristin Peyer 03/02/24 2225    Hershel Los, MD 03/02/24 [phone]

## 2024-03-02 NOTE — ED Provider Notes (Signed)
 Patient here after mechanical fall.  CT head and C-spine are reassuring.  Patient noted to have possible pleural effusion on CT.    CXR ordered and consistent with small bilateral pleural effusions.  He does have some pitting edema on his lower extremities and takes lasix as needed.  Patient has ambulated maintaining >90% O2 sat and says that he feels normal while walking.  I've encouraged him to take lasix for the next 7 days and to follow-up with his PCP.   Sherel Dikes, PA-C 03/02/24 2256    Hershel Los, MD 03/02/24 [phone]

## 2024-03-03 DIAGNOSIS — R1312 Dysphagia, oropharyngeal phase: Secondary | ICD-10-CM | POA: Diagnosis not present

## 2024-03-03 DIAGNOSIS — R4182 Altered mental status, unspecified: Secondary | ICD-10-CM | POA: Diagnosis not present

## 2024-03-03 DIAGNOSIS — S41102D Unspecified open wound of left upper arm, subsequent encounter: Secondary | ICD-10-CM | POA: Diagnosis not present

## 2024-03-03 DIAGNOSIS — G231 Progressive supranuclear ophthalmoplegia [Steele-Richardson-Olszewski]: Secondary | ICD-10-CM | POA: Diagnosis not present

## 2024-03-08 DIAGNOSIS — N390 Urinary tract infection, site not specified: Secondary | ICD-10-CM | POA: Diagnosis not present

## 2024-03-08 DIAGNOSIS — Z9181 History of falling: Secondary | ICD-10-CM | POA: Diagnosis not present

## 2024-03-08 DIAGNOSIS — W19XXXA Unspecified fall, initial encounter: Secondary | ICD-10-CM | POA: Diagnosis not present

## 2024-03-08 DIAGNOSIS — E162 Hypoglycemia, unspecified: Secondary | ICD-10-CM | POA: Diagnosis not present

## 2024-03-08 DIAGNOSIS — J90 Pleural effusion, not elsewhere classified: Secondary | ICD-10-CM | POA: Diagnosis not present

## 2024-03-09 ENCOUNTER — Telehealth: Payer: Self-pay | Admitting: Diagnostic Neuroimaging

## 2024-03-09 DIAGNOSIS — E119 Type 2 diabetes mellitus without complications: Secondary | ICD-10-CM | POA: Diagnosis not present

## 2024-03-09 DIAGNOSIS — G231 Progressive supranuclear ophthalmoplegia [Steele-Richardson-Olszewski]: Secondary | ICD-10-CM | POA: Diagnosis not present

## 2024-03-09 DIAGNOSIS — I509 Heart failure, unspecified: Secondary | ICD-10-CM | POA: Diagnosis not present

## 2024-03-09 DIAGNOSIS — Z9181 History of falling: Secondary | ICD-10-CM | POA: Diagnosis not present

## 2024-03-09 DIAGNOSIS — I1310 Hypertensive heart and chronic kidney disease without heart failure, with stage 1 through stage 4 chronic kidney disease, or unspecified chronic kidney disease: Secondary | ICD-10-CM | POA: Diagnosis not present

## 2024-03-09 DIAGNOSIS — E1122 Type 2 diabetes mellitus with diabetic chronic kidney disease: Secondary | ICD-10-CM | POA: Diagnosis not present

## 2024-03-09 DIAGNOSIS — N182 Chronic kidney disease, stage 2 (mild): Secondary | ICD-10-CM | POA: Diagnosis not present

## 2024-03-09 NOTE — Telephone Encounter (Signed)
 Pt's wife called stating that the pt is needing a PSP Eval appt and that it needs to be done quickly. There was nothing avail till Oct. Wife would like to know if the pt can be squeezed in. Please advise.

## 2024-03-10 NOTE — Telephone Encounter (Signed)
 Spoke w/Pt wife who stated Pt has worsened and has multiple falls, but without serious injury. Spouse stated Pt is now in asst living - [facility] [address], as she was having difficulty caring for him. Stated Pt has a fall almost 1xweek and at times even when using his walker. Pt has become incontinent of B&B, gets confused and is not always able to answer questions appropriately, and speech has slowed. Spouse stated PCP recommended neurology do updated eval of PSP for SNF placement due to increasing needs and no longer able to afford ALF so Pt needs to go on medicaid. Spouse also stated due to this they are having to file for divorce to make it easier for Pt to obtain medicaid funding for SNF care. Scheduled Pt for earlier visit 04/20/24 as requested. Spouse stated Pt has upcoming visits this week with both endocrinology and cardiology. Spouse stated once Pt has seen both endo and cards this week will decide if needs to keep neuro appt for June and will call to cancel if that is the case. Spouse stated thanks for the call.

## 2024-03-11 DIAGNOSIS — N189 Chronic kidney disease, unspecified: Secondary | ICD-10-CM | POA: Diagnosis not present

## 2024-03-11 DIAGNOSIS — M109 Gout, unspecified: Secondary | ICD-10-CM | POA: Diagnosis not present

## 2024-03-11 DIAGNOSIS — E7800 Pure hypercholesterolemia, unspecified: Secondary | ICD-10-CM | POA: Diagnosis not present

## 2024-03-11 DIAGNOSIS — I10 Essential (primary) hypertension: Secondary | ICD-10-CM | POA: Diagnosis not present

## 2024-03-11 DIAGNOSIS — E1165 Type 2 diabetes mellitus with hyperglycemia: Secondary | ICD-10-CM | POA: Diagnosis not present

## 2024-03-12 ENCOUNTER — Encounter (HOSPITAL_BASED_OUTPATIENT_CLINIC_OR_DEPARTMENT_OTHER): Payer: Self-pay | Admitting: Family

## 2024-03-12 ENCOUNTER — Ambulatory Visit (HOSPITAL_BASED_OUTPATIENT_CLINIC_OR_DEPARTMENT_OTHER): Admitting: Family

## 2024-03-12 VITALS — BP 120/70 | HR 81 | Ht 69.0 in | Wt 182.0 lb

## 2024-03-12 DIAGNOSIS — R600 Localized edema: Secondary | ICD-10-CM

## 2024-03-12 DIAGNOSIS — E782 Mixed hyperlipidemia: Secondary | ICD-10-CM | POA: Diagnosis not present

## 2024-03-12 DIAGNOSIS — I493 Ventricular premature depolarization: Secondary | ICD-10-CM

## 2024-03-12 DIAGNOSIS — I10 Essential (primary) hypertension: Secondary | ICD-10-CM | POA: Diagnosis not present

## 2024-03-12 MED ORDER — FUROSEMIDE 20 MG PO TABS: ORAL_TABLET | ORAL | 1 refills | Status: DC

## 2024-03-12 NOTE — Patient Instructions (Signed)
 Medication Instructions:  Your physician has recommended you make the following change in your medication:   Start: Lasix  once a week on Mondays   *If you need a refill on your cardiac medications before your next appointment, please call your pharmacy*  Lab Work: Your physician recommends that you return for lab work today- BMP & BNP   Follow-Up: At [facility], you and your health needs are our priority.  As part of our continuing mission to provide you with exceptional heart care, our providers are all part of one team.  This team includes your primary Cardiologist (physician) and Advanced Practice Providers or APPs (Physician Assistants and Nurse Practitioners) who all work together to provide you with the care you need, when you need it.  Please follow up in 3-4 months  with Dr. Theodis Fiscal, Slater Duncan, NP or Neomi Banks, NP

## 2024-03-12 NOTE — Progress Notes (Signed)
 Cardiology Office Note:  .   Date:  03/12/2024  ID:  Joel Webb, DOB 11-17-1946, MRN [id number] PCP: Rae Bugler, MD  [facility] Providers Cardiologist:  None    History of Present Illness: .   Joel Webb is a 78 y.o. male with a history of hypertension, hyperlipidemia, CKD 3, diabetes mellitus, GERD, progressive supernuclear palsy (affecting walking, balance, eye movements, swallowing, and cognitive deficits)  ED visit 10/2021 near-syncope episode with prodrome of lightheadedness and general weakness.  EMS found him hypotensive in 70s.  Amlodipine and olmesartan  had been recently increased.  He also noted poor p.o. intake that day.  Frequent PVCs by telemetry. Initial evaluation 01/2022 for posthospital follow-up of near-syncope.  Blood pressure is well-controlled on regimen of olmesartan  and reduced dose amlodipine.  HCTZ had been discontinued.  He was referred to PREP exercise program.  He wore a monitor showing rare PAC/PVC and up to 6 beats of SVT.  Seen 05/28/2022 by Dr. Theodis Fiscal.  BP at home averaging 137 over 80s.  No recurrent presyncopal episodes.  He was participating in PREP exercise program.  Last seen 05/19/2023 after moving into [facility] 03/2023. PCP team had stopped hydrochlorothiazide  due to gout. Due to increased LE edema, Rx'd Lasix  20mg  PRN once per week for LE edema. Leg elevation encouraged.   ED visit 03/02/24 with mechanical fall. CT head, C-spine reassuring and CXR with small bilateral pleural effusions, pitting edema. He was recommended Lasix  20mg  daily x 7 days then follow up with PCP.   He presents today for follow-up with his wife. Weight 05/19/23 187 lbs and today 182 lbs. He does not believe that he received Furosemide  20mg  daily x 7 days as directed by ED provider.  His wife assists with history today due to cognitive deficits.  Noted increased care burden despite ALF and considering moving into SNF.  We reviewed chest x-ray 03/07/2024 with  impression " interval development of mild cardiogenic failure with small bilateral pleural effusion".  He reports his lower extremity edema is overall improved.  No exertional dyspnea however is sedentary for most of the day.  We discussed possible further workup of heart failure including echocardiogram, prefers to proceed with conservative management.  ROS: Please see the history of present illness.    All other systems reviewed and are negative.  Studies Reviewed: .         Cardiac Studies & Procedures   ______________________________________________________________________________________________        Carles Cheadle  LONG TERM MONITOR (3-14 DAYS) 02/18/2022  Narrative 2 Day Zio Monitor  Quality: Fair.  Baseline artifact. Predominant rhythm: sinus rhythm First degree AV block Average heart rate: 75 bpm Max heart rate: 135 bpm Min heart rate: 51 bpm Pauses >2.5 seconds: none  Up to 6 beats SVT Rare PACs and PVCs Ventricular bigeminy and trigeminy   Tiffany C. Theodis Fiscal, MD, [facility] 03/03/2022 4:59 PM       ______________________________________________________________________________________________      Risk Assessment/Calculations:             Physical Exam:   VS:  BP 120/70   Pulse 81   Ht 5\' 9"  (1.753 m)   Wt 182 lb (82.6 kg)   SpO2 100%   BMI 26.88 kg/m    Wt Readings from Last 3 Encounters:  03/12/24 182 lb (82.6 kg)  03/02/24 175 lb (79.4 kg)  05/19/23 187 lb (84.8 kg)    GEN: Well nourished, well developed in no acute distress NECK: No JVD;  No carotid bruits CARDIAC: RRR, no murmurs, rubs, gallops RESPIRATORY:  Clear to auscultation without rales, wheezing or rhonchi  ABDOMEN: Soft, non-tender, non-distended EXTREMITIES:  Nonpitting L ankle edema; No deformity   ASSESSMENT AND PLAN: .    LE edema / Venous insufficiency / ? Heart Failure -  CXR 03/02/24 impression "interval development of mild cardiogenic failure with small bilateral pleural  effusion".  He is mostly sedentary so difficult to ascertain NYHA status.  Mobility limited by progressive supranuclear palsy and uses wheelchair or Elhadj Girton at baseline.  Reports no dyspnea.  Lower extremity edema has been overall improved compared to ED visit.   We discussed possible echocardiogram to assess for heart failure however he and his wife would prefer conservative management through medications.  Rx Lasix  20mg  once per week on Mondays as was not consistently receiving PRN dosing at ALF.  BMET/BNP today. Reassess Lasix  dosing based on result.  Gout-HCTZ previously discontinued by PCP due to recurrent gout.  Careful utilization of loop diuretic Lasix .  May consider alternative such as spironolactone.  No present symptoms of gout.  HTN - BP at goal less than 130/80.  Continue amlodipine 5 mg daily, olmesartan  20 mg daily.   Progressive supranuclear palsy -follows with neurology.  His wife has noted progressive memory issues as well as more frequent falls.  While he is in ALF they are considering transition to SNF.  HLD - LDL goal <100, ideally <70.On Atorvastatin 80mg  and Zetia  10mg  every day.  Continue same.  PVC - Prior monitor 02/2022 with PVC burden <1%.  Denies palpitations.  Would recommend beta-blocker only if symptomatic.  DM2 - Continue to follow with PCP.        Dispo: follow up in 3-4 mos  Signed, Clearnce Curia, NP

## 2024-03-13 LAB — BASIC METABOLIC PANEL WITH GFR
BUN/Creatinine Ratio: 17 (ref 10–24)
BUN: 25 mg/dL (ref 8–27)
CO2: 21 mmol/L (ref 20–29)
Calcium: 8.5 mg/dL — ABNORMAL LOW (ref 8.6–10.2)
Chloride: 105 mmol/L (ref 96–106)
Creatinine, Ser: 1.51 mg/dL — ABNORMAL HIGH (ref 0.76–1.27)
Glucose: 147 mg/dL — ABNORMAL HIGH (ref 70–99)
Potassium: 4.5 mmol/L (ref 3.5–5.2)
Sodium: 141 mmol/L (ref 134–144)
eGFR: 47 mL/min/{1.73_m2} — ABNORMAL LOW (ref >59)

## 2024-03-13 LAB — BRAIN NATRIURETIC PEPTIDE: BNP: 612.3 pg/mL — ABNORMAL HIGH (ref 0.0–100.0)

## 2024-03-15 ENCOUNTER — Encounter (HOSPITAL_BASED_OUTPATIENT_CLINIC_OR_DEPARTMENT_OTHER): Payer: Self-pay

## 2024-03-15 DIAGNOSIS — R600 Localized edema: Secondary | ICD-10-CM

## 2024-03-15 MED ORDER — FUROSEMIDE 20 MG PO TABS: ORAL_TABLET | ORAL | 3 refills | Status: DC

## 2024-03-15 NOTE — Telephone Encounter (Signed)
Tried calling patient,left message to callback.

## 2024-03-15 NOTE — Addendum Note (Signed)
 Addended by: Guss Legacy on: 03/15/2024 08:53 AM   Modules accepted: Orders

## 2024-03-16 DIAGNOSIS — F331 Major depressive disorder, recurrent, moderate: Secondary | ICD-10-CM | POA: Diagnosis not present

## 2024-03-16 DIAGNOSIS — I1310 Hypertensive heart and chronic kidney disease without heart failure, with stage 1 through stage 4 chronic kidney disease, or unspecified chronic kidney disease: Secondary | ICD-10-CM | POA: Diagnosis not present

## 2024-03-18 DIAGNOSIS — F331 Major depressive disorder, recurrent, moderate: Secondary | ICD-10-CM | POA: Diagnosis not present

## 2024-03-18 DIAGNOSIS — R600 Localized edema: Secondary | ICD-10-CM | POA: Diagnosis not present

## 2024-03-18 DIAGNOSIS — G3184 Mild cognitive impairment, so stated: Secondary | ICD-10-CM | POA: Diagnosis not present

## 2024-03-18 DIAGNOSIS — R296 Repeated falls: Secondary | ICD-10-CM | POA: Diagnosis not present

## 2024-03-18 IMAGING — PT NM PET TUM IMG INITIAL (PI) WHOLE BODY
1 series · 20 of 25 positions shown · non-contrast
Comparison: Brain MRI 02/23/2022

CLINICAL DATA: Dementia. Frontotemporal dementia versus Alzheimer's
type dementia

EXAM:
NM PET METABOLIC BRAIN
TECHNIQUE: 10.0 mCi F-18 FDG was injected intravenously. Full-ring PET imaging
was performed from the vertex to skull base. CT data was obtained
and used for attenuation correction and anatomic localization.
FASTING BLOOD GLUCOSE:  Value: 81 mg/dl

[Series 1062: mpr tra 3mm range · 0.91mm/px · 20 of 41 slices shown]
[im 1/41]
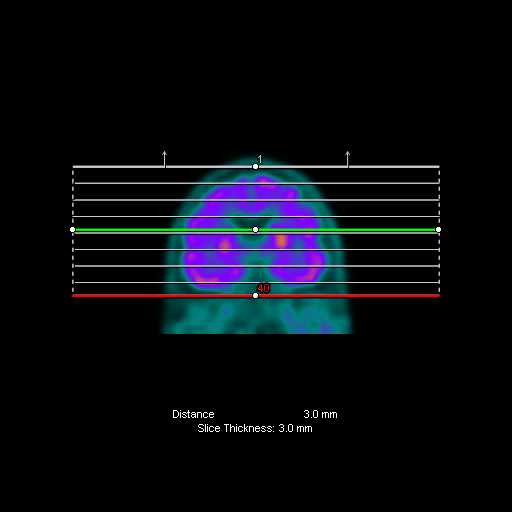
[im 2/41]
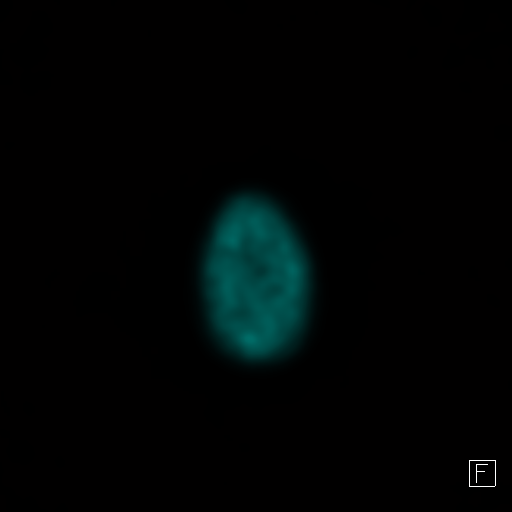
[im 6/41]
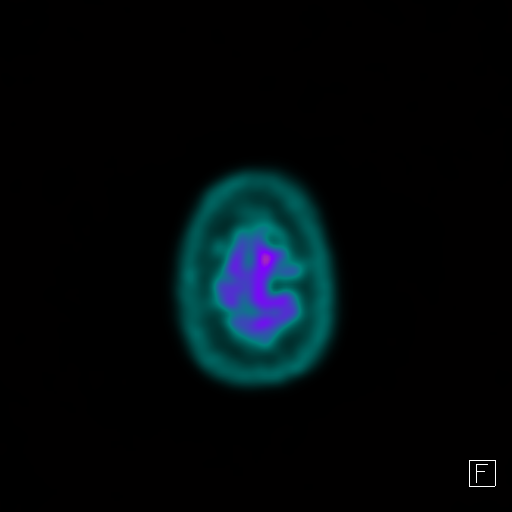
[im 7/41]
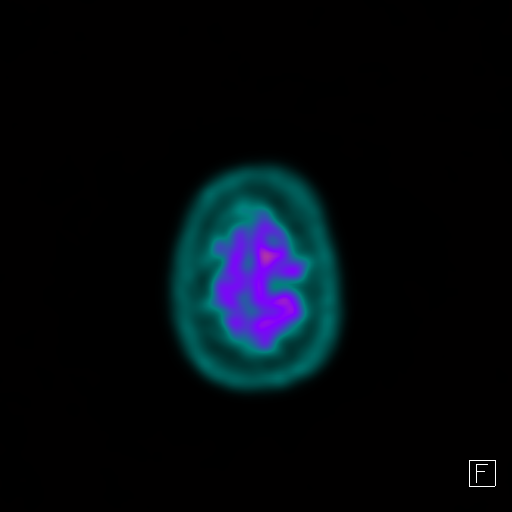
[im 9/41]
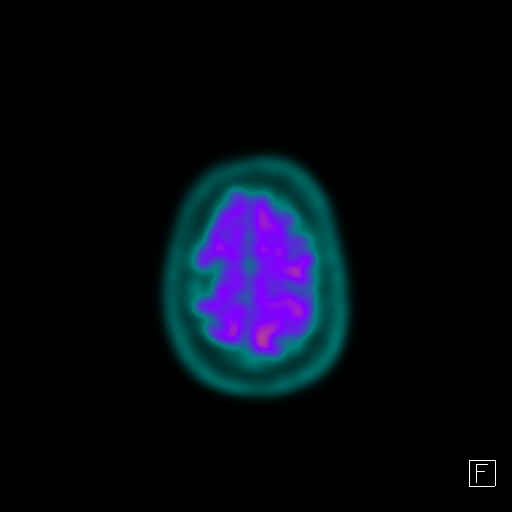
[im 11/41]
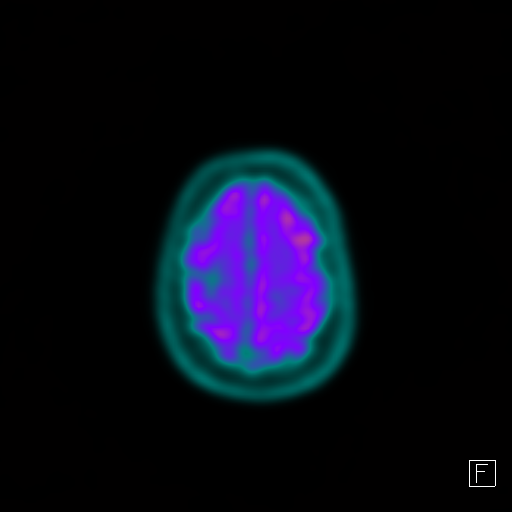
[im 14/41]
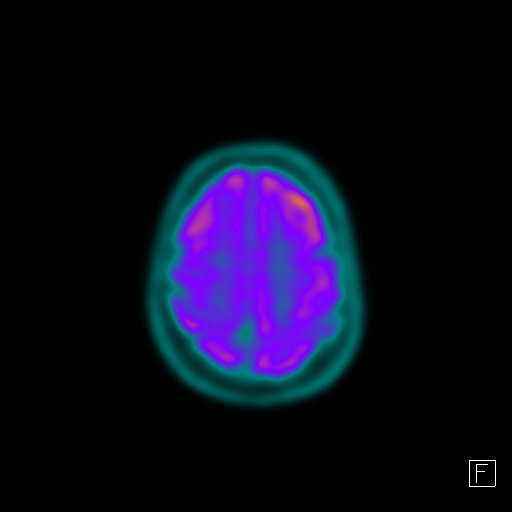
[im 16/41]
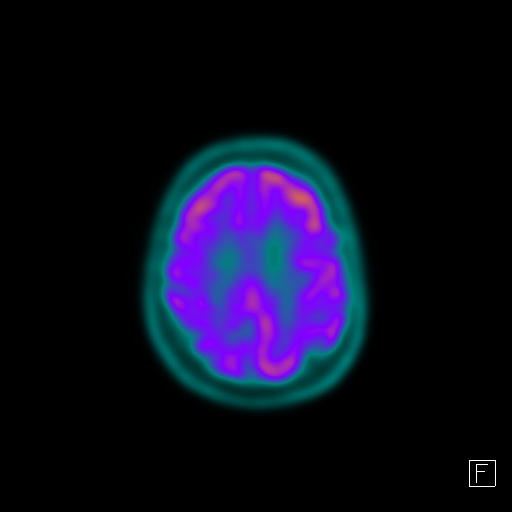
[im 17/41]
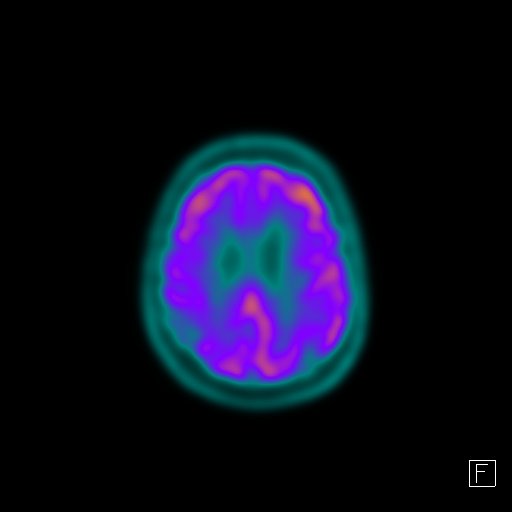
[im 19/41]
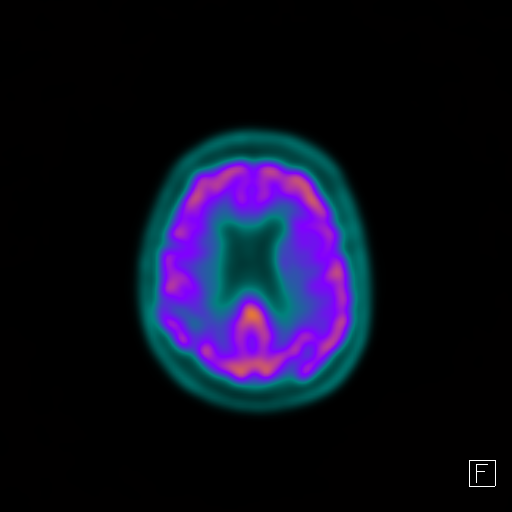
[im 22/41]
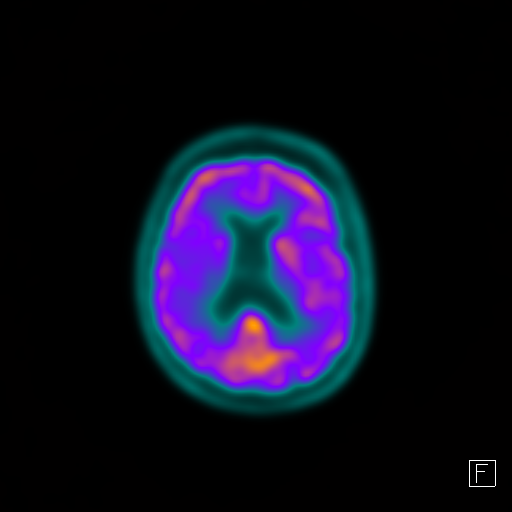
[im 24/41]
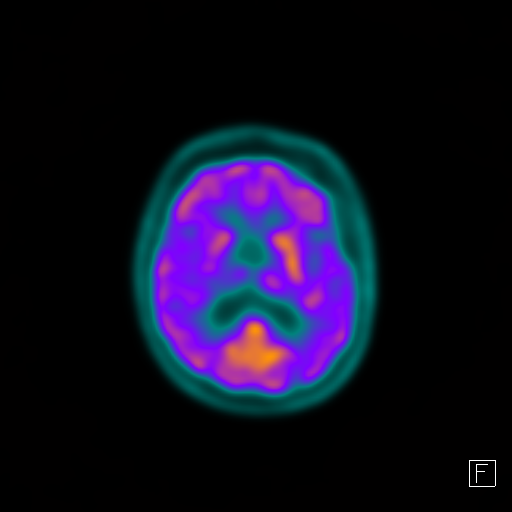
[im 26/41]
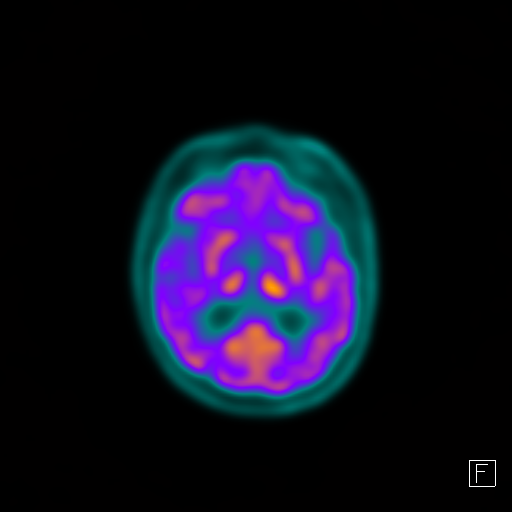
[im 27/41]
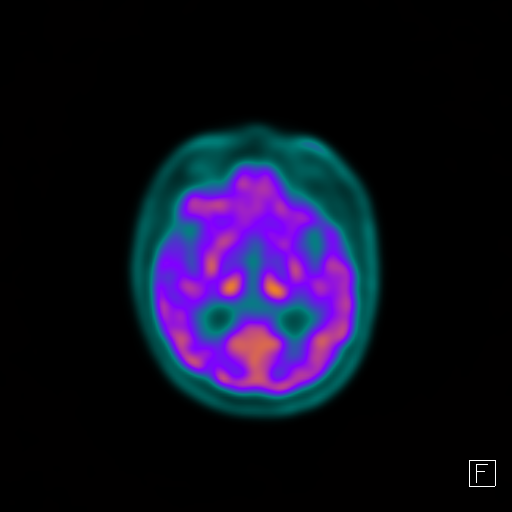
[im 31/41]
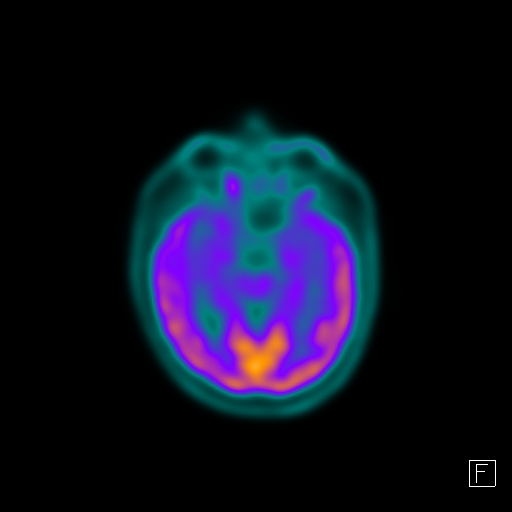
[im 32/41]
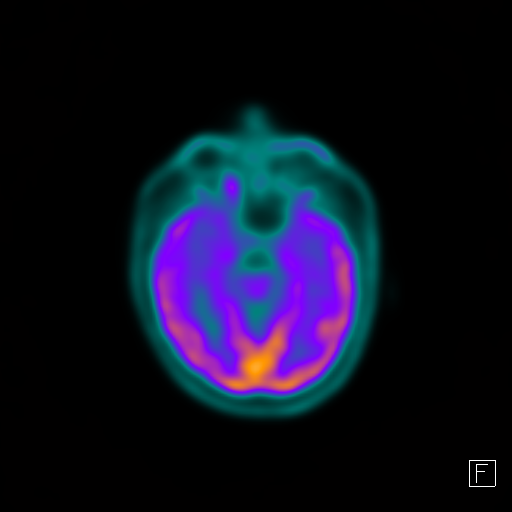
[im 34/41]
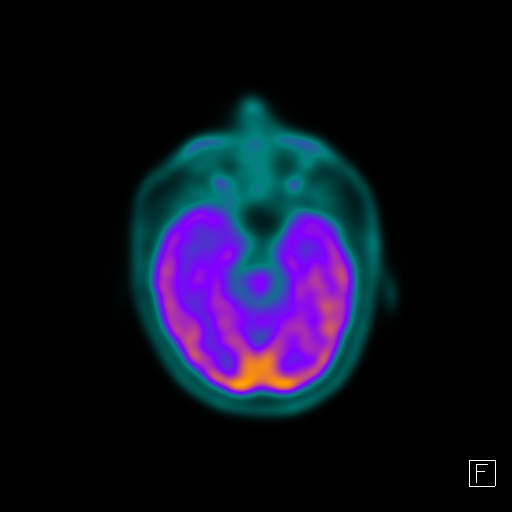
[im 36/41]
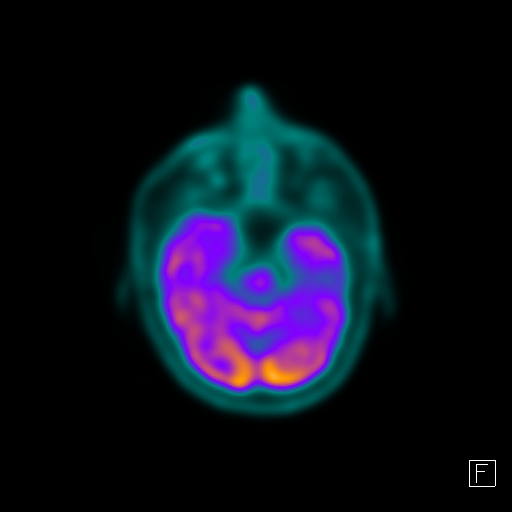
[im 39/41]
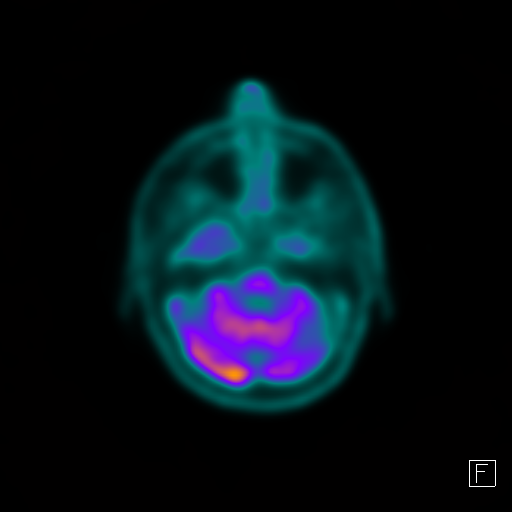
[im 41/41]
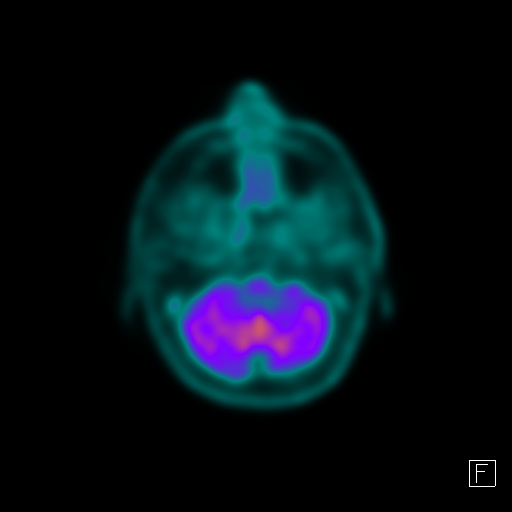

[20 of 25 positions shown; findings below may reference images not displayed]

FINDINGS: No clear loss of cortical metabolic activity in the high parietal
lobes. Normal cortical metabolism in the frontal lobes. Normal
cortical metabolism in the occipital lobes and temporal lobes.
IMPRESSION: No significant loss of cortical metabolism to suggest Alzheimer's
type dementia. No evidence of frontotemporal dementia

## 2024-03-22 DIAGNOSIS — N400 Enlarged prostate without lower urinary tract symptoms: Secondary | ICD-10-CM | POA: Diagnosis not present

## 2024-03-22 DIAGNOSIS — E1122 Type 2 diabetes mellitus with diabetic chronic kidney disease: Secondary | ICD-10-CM | POA: Diagnosis not present

## 2024-03-22 DIAGNOSIS — R296 Repeated falls: Secondary | ICD-10-CM | POA: Diagnosis not present

## 2024-03-22 DIAGNOSIS — G3184 Mild cognitive impairment, so stated: Secondary | ICD-10-CM | POA: Diagnosis not present

## 2024-03-22 DIAGNOSIS — J90 Pleural effusion, not elsewhere classified: Secondary | ICD-10-CM | POA: Diagnosis not present

## 2024-03-22 DIAGNOSIS — S40812A Abrasion of left upper arm, initial encounter: Secondary | ICD-10-CM | POA: Diagnosis not present

## 2024-03-29 DIAGNOSIS — S41102A Unspecified open wound of left upper arm, initial encounter: Secondary | ICD-10-CM | POA: Diagnosis not present

## 2024-03-29 DIAGNOSIS — F329 Major depressive disorder, single episode, unspecified: Secondary | ICD-10-CM | POA: Diagnosis not present

## 2024-03-29 DIAGNOSIS — Z515 Encounter for palliative care: Secondary | ICD-10-CM | POA: Diagnosis not present

## 2024-03-29 DIAGNOSIS — W19XXXA Unspecified fall, initial encounter: Secondary | ICD-10-CM | POA: Diagnosis not present

## 2024-03-29 DIAGNOSIS — E1129 Type 2 diabetes mellitus with other diabetic kidney complication: Secondary | ICD-10-CM | POA: Diagnosis not present

## 2024-03-29 DIAGNOSIS — N183 Chronic kidney disease, stage 3 unspecified: Secondary | ICD-10-CM | POA: Diagnosis not present

## 2024-03-29 DIAGNOSIS — R296 Repeated falls: Secondary | ICD-10-CM | POA: Diagnosis not present

## 2024-03-29 DIAGNOSIS — G231 Progressive supranuclear ophthalmoplegia [Steele-Richardson-Olszewski]: Secondary | ICD-10-CM | POA: Diagnosis not present

## 2024-03-29 DIAGNOSIS — B309 Viral conjunctivitis, unspecified: Secondary | ICD-10-CM | POA: Diagnosis not present

## 2024-03-29 DIAGNOSIS — G3184 Mild cognitive impairment, so stated: Secondary | ICD-10-CM | POA: Diagnosis not present

## 2024-03-29 DIAGNOSIS — I10 Essential (primary) hypertension: Secondary | ICD-10-CM | POA: Diagnosis not present

## 2024-03-29 DIAGNOSIS — E1122 Type 2 diabetes mellitus with diabetic chronic kidney disease: Secondary | ICD-10-CM | POA: Diagnosis not present

## 2024-03-29 DIAGNOSIS — S41112A Laceration without foreign body of left upper arm, initial encounter: Secondary | ICD-10-CM | POA: Diagnosis not present

## 2024-03-29 DIAGNOSIS — E1165 Type 2 diabetes mellitus with hyperglycemia: Secondary | ICD-10-CM | POA: Diagnosis not present

## 2024-03-29 DIAGNOSIS — K219 Gastro-esophageal reflux disease without esophagitis: Secondary | ICD-10-CM | POA: Diagnosis not present

## 2024-03-29 DIAGNOSIS — S51002A Unspecified open wound of left elbow, initial encounter: Secondary | ICD-10-CM | POA: Diagnosis not present

## 2024-03-30 DIAGNOSIS — Z9181 History of falling: Secondary | ICD-10-CM | POA: Diagnosis not present

## 2024-03-30 DIAGNOSIS — T17928S Food in respiratory tract, part unspecified causing other injury, sequela: Secondary | ICD-10-CM | POA: Diagnosis not present

## 2024-03-30 DIAGNOSIS — F331 Major depressive disorder, recurrent, moderate: Secondary | ICD-10-CM | POA: Diagnosis not present

## 2024-03-30 DIAGNOSIS — I509 Heart failure, unspecified: Secondary | ICD-10-CM | POA: Diagnosis not present

## 2024-03-30 DIAGNOSIS — F439 Reaction to severe stress, unspecified: Secondary | ICD-10-CM | POA: Diagnosis not present

## 2024-03-30 DIAGNOSIS — G231 Progressive supranuclear ophthalmoplegia [Steele-Richardson-Olszewski]: Secondary | ICD-10-CM | POA: Diagnosis not present

## 2024-03-30 DIAGNOSIS — E1122 Type 2 diabetes mellitus with diabetic chronic kidney disease: Secondary | ICD-10-CM | POA: Diagnosis not present

## 2024-03-30 DIAGNOSIS — F329 Major depressive disorder, single episode, unspecified: Secondary | ICD-10-CM | POA: Diagnosis not present

## 2024-03-30 DIAGNOSIS — T148XXA Other injury of unspecified body region, initial encounter: Secondary | ICD-10-CM | POA: Diagnosis not present

## 2024-03-31 DIAGNOSIS — S51002D Unspecified open wound of left elbow, subsequent encounter: Secondary | ICD-10-CM | POA: Diagnosis not present

## 2024-03-31 DIAGNOSIS — F329 Major depressive disorder, single episode, unspecified: Secondary | ICD-10-CM | POA: Diagnosis not present

## 2024-03-31 DIAGNOSIS — S51802D Unspecified open wound of left forearm, subsequent encounter: Secondary | ICD-10-CM | POA: Diagnosis not present

## 2024-03-31 DIAGNOSIS — E1122 Type 2 diabetes mellitus with diabetic chronic kidney disease: Secondary | ICD-10-CM | POA: Diagnosis not present

## 2024-03-31 DIAGNOSIS — E1165 Type 2 diabetes mellitus with hyperglycemia: Secondary | ICD-10-CM | POA: Diagnosis not present

## 2024-03-31 DIAGNOSIS — I129 Hypertensive chronic kidney disease with stage 1 through stage 4 chronic kidney disease, or unspecified chronic kidney disease: Secondary | ICD-10-CM | POA: Diagnosis not present

## 2024-04-01 DIAGNOSIS — G231 Progressive supranuclear ophthalmoplegia [Steele-Richardson-Olszewski]: Secondary | ICD-10-CM | POA: Diagnosis not present

## 2024-04-01 DIAGNOSIS — R2689 Other abnormalities of gait and mobility: Secondary | ICD-10-CM | POA: Diagnosis not present

## 2024-04-01 DIAGNOSIS — Z9181 History of falling: Secondary | ICD-10-CM | POA: Diagnosis not present

## 2024-04-01 DIAGNOSIS — E1122 Type 2 diabetes mellitus with diabetic chronic kidney disease: Secondary | ICD-10-CM | POA: Diagnosis not present

## 2024-04-01 DIAGNOSIS — I10 Essential (primary) hypertension: Secondary | ICD-10-CM | POA: Diagnosis not present

## 2024-04-05 ENCOUNTER — Observation Stay (HOSPITAL_COMMUNITY)
Admission: EM | Admit: 2024-04-05 | Discharge: 2024-04-10 | Disposition: A | Discharge status: Skilled Nursing Facility | Attending: Internal Medicine | Admitting: Internal Medicine

## 2024-04-05 ENCOUNTER — Other Ambulatory Visit: Payer: Self-pay

## 2024-04-05 ENCOUNTER — Encounter (HOSPITAL_COMMUNITY): Payer: Self-pay | Admitting: Emergency Medicine

## 2024-04-05 ENCOUNTER — Emergency Department (HOSPITAL_COMMUNITY)

## 2024-04-05 DIAGNOSIS — W19XXXA Unspecified fall, initial encounter: Secondary | ICD-10-CM | POA: Diagnosis not present

## 2024-04-05 DIAGNOSIS — N1832 Chronic kidney disease, stage 3b: Secondary | ICD-10-CM | POA: Diagnosis not present

## 2024-04-05 DIAGNOSIS — Z79899 Other long term (current) drug therapy: Secondary | ICD-10-CM | POA: Diagnosis not present

## 2024-04-05 DIAGNOSIS — E1122 Type 2 diabetes mellitus with diabetic chronic kidney disease: Secondary | ICD-10-CM | POA: Insufficient documentation

## 2024-04-05 DIAGNOSIS — R5381 Other malaise: Secondary | ICD-10-CM | POA: Diagnosis not present

## 2024-04-05 DIAGNOSIS — Z794 Long term (current) use of insulin: Secondary | ICD-10-CM | POA: Diagnosis not present

## 2024-04-05 DIAGNOSIS — R296 Repeated falls: Principal | ICD-10-CM

## 2024-04-05 DIAGNOSIS — R918 Other nonspecific abnormal finding of lung field: Secondary | ICD-10-CM | POA: Diagnosis not present

## 2024-04-05 DIAGNOSIS — T17320A Food in larynx causing asphyxiation, initial encounter: Secondary | ICD-10-CM | POA: Diagnosis not present

## 2024-04-05 DIAGNOSIS — S41102A Unspecified open wound of left upper arm, initial encounter: Secondary | ICD-10-CM | POA: Diagnosis not present

## 2024-04-05 DIAGNOSIS — I517 Cardiomegaly: Secondary | ICD-10-CM | POA: Diagnosis not present

## 2024-04-05 DIAGNOSIS — I130 Hypertensive heart and chronic kidney disease with heart failure and stage 1 through stage 4 chronic kidney disease, or unspecified chronic kidney disease: Secondary | ICD-10-CM | POA: Insufficient documentation

## 2024-04-05 DIAGNOSIS — E785 Hyperlipidemia, unspecified: Secondary | ICD-10-CM | POA: Diagnosis not present

## 2024-04-05 DIAGNOSIS — J90 Pleural effusion, not elsewhere classified: Secondary | ICD-10-CM | POA: Diagnosis not present

## 2024-04-05 DIAGNOSIS — Z87891 Personal history of nicotine dependence: Secondary | ICD-10-CM | POA: Diagnosis not present

## 2024-04-05 DIAGNOSIS — T17308A Unspecified foreign body in larynx causing other injury, initial encounter: Secondary | ICD-10-CM | POA: Insufficient documentation

## 2024-04-05 DIAGNOSIS — S51002A Unspecified open wound of left elbow, initial encounter: Secondary | ICD-10-CM | POA: Diagnosis not present

## 2024-04-05 DIAGNOSIS — G231 Progressive supranuclear ophthalmoplegia [Steele-Richardson-Olszewski]: Secondary | ICD-10-CM | POA: Diagnosis not present

## 2024-04-05 DIAGNOSIS — E1165 Type 2 diabetes mellitus with hyperglycemia: Secondary | ICD-10-CM | POA: Insufficient documentation

## 2024-04-05 DIAGNOSIS — I5021 Acute systolic (congestive) heart failure: Secondary | ICD-10-CM | POA: Diagnosis not present

## 2024-04-05 DIAGNOSIS — R9082 White matter disease, unspecified: Secondary | ICD-10-CM | POA: Diagnosis not present

## 2024-04-05 DIAGNOSIS — S41001A Unspecified open wound of right shoulder, initial encounter: Secondary | ICD-10-CM | POA: Diagnosis not present

## 2024-04-05 DIAGNOSIS — R059 Cough, unspecified: Secondary | ICD-10-CM | POA: Diagnosis not present

## 2024-04-05 DIAGNOSIS — I10 Essential (primary) hypertension: Secondary | ICD-10-CM | POA: Diagnosis not present

## 2024-04-05 DIAGNOSIS — R1312 Dysphagia, oropharyngeal phase: Secondary | ICD-10-CM | POA: Diagnosis not present

## 2024-04-05 DIAGNOSIS — Z043 Encounter for examination and observation following other accident: Secondary | ICD-10-CM | POA: Diagnosis not present

## 2024-04-05 LAB — URINALYSIS, W/ REFLEX TO CULTURE (INFECTION SUSPECTED)
Bacteria, UA: NONE SEEN
Bilirubin Urine: NEGATIVE
Glucose, UA: >=500 mg/dL — AB
Ketones, ur: NEGATIVE mg/dL
Leukocytes,Ua: NEGATIVE
Nitrite: NEGATIVE
Protein, ur: 100 mg/dL — AB
Specific Gravity, Urine: 1.009 (ref 1.005–1.030)
pH: 5 (ref 5.0–8.0)

## 2024-04-05 LAB — CBC WITH DIFFERENTIAL/PLATELET
Abs Immature Granulocytes: 0.04 10*3/uL (ref 0.00–0.07)
Basophils Absolute: 0 10*3/uL (ref 0.0–0.1)
Basophils Relative: 0 %
Eosinophils Absolute: 0.2 10*3/uL (ref 0.0–0.5)
Eosinophils Relative: 2 %
HCT: 42.6 % (ref 39.0–52.0)
Hemoglobin: 14.4 g/dL (ref 13.0–17.0)
Immature Granulocytes: 1 %
Lymphocytes Relative: 19 %
Lymphs Abs: 1.6 10*3/uL (ref 0.7–4.0)
MCH: 29.4 pg (ref 26.0–34.0)
MCHC: 33.8 g/dL (ref 30.0–36.0)
MCV: 87.1 fL (ref 80.0–100.0)
Monocytes Absolute: 0.9 10*3/uL (ref 0.1–1.0)
Monocytes Relative: 11 %
Neutro Abs: 5.7 10*3/uL (ref 1.7–7.7)
Neutrophils Relative %: 67 %
Platelets: 143 10*3/uL — ABNORMAL LOW (ref 150–400)
RBC: 4.89 MIL/uL (ref 4.22–5.81)
RDW: 14.3 % (ref 11.5–15.5)
WBC: 8.4 10*3/uL (ref 4.0–10.5)
nRBC: 0 % (ref 0.0–0.2)

## 2024-04-05 LAB — COMPREHENSIVE METABOLIC PANEL WITH GFR
ALT: 12 U/L (ref 0–44)
AST: 16 U/L (ref 15–41)
Albumin: 3.4 g/dL — ABNORMAL LOW (ref 3.5–5.0)
Alkaline Phosphatase: 98 U/L (ref 38–126)
Anion gap: 9 (ref 5–15)
BUN: 25 mg/dL — ABNORMAL HIGH (ref 8–23)
CO2: 22 mmol/L (ref 22–32)
Calcium: 9.3 mg/dL (ref 8.9–10.3)
Chloride: 107 mmol/L (ref 98–111)
Creatinine, Ser: 1.46 mg/dL — ABNORMAL HIGH (ref 0.61–1.24)
GFR, Estimated: 49 mL/min — ABNORMAL LOW (ref >60)
Glucose, Bld: 176 mg/dL — ABNORMAL HIGH (ref 70–99)
Potassium: 4 mmol/L (ref 3.5–5.1)
Sodium: 138 mmol/L (ref 135–145)
Total Bilirubin: 0.7 mg/dL (ref 0.0–1.2)
Total Protein: 6.5 g/dL (ref 6.5–8.1)

## 2024-04-05 MED ORDER — PROCHLORPERAZINE EDISYLATE 10 MG/2ML IJ SOLN: 5.0000 mg | Freq: Four times a day (QID) | INTRAMUSCULAR | Status: DC | PRN

## 2024-04-05 MED ORDER — ENOXAPARIN SODIUM 40 MG/0.4ML IJ SOSY
40.0000 mg | PREFILLED_SYRINGE | Freq: Every day | INTRAMUSCULAR | Status: DC
  Administered 2024-04-06 – 2024-04-10 (×5): 40 mg via SUBCUTANEOUS
  Filled 2024-04-05 (×5): qty 0.4

## 2024-04-05 MED ORDER — ACETAMINOPHEN 325 MG PO TABS: 650.0000 mg | ORAL_TABLET | Freq: Four times a day (QID) | ORAL | Status: DC | PRN

## 2024-04-05 MED ORDER — POLYETHYLENE GLYCOL 3350 17 G PO PACK: 17.0000 g | PACK | Freq: Every day | ORAL | Status: DC | PRN

## 2024-04-05 MED ORDER — LACTATED RINGERS IV SOLN: INTRAVENOUS | Status: AC

## 2024-04-05 MED ORDER — MELATONIN 5 MG PO TABS
5.0000 mg | ORAL_TABLET | Freq: Every evening | ORAL | Status: DC | PRN
  Administered 2024-04-06 – 2024-04-07 (×2): 5 mg via ORAL
  Filled 2024-04-05 (×2): qty 1

## 2024-04-05 MED ORDER — INSULIN ASPART 100 UNIT/ML IJ SOLN
0.0000 [IU] | Freq: Three times a day (TID) | INTRAMUSCULAR | Status: DC
  Administered 2024-04-06 – 2024-04-07 (×2): 2 [IU] via SUBCUTANEOUS
  Administered 2024-04-07 – 2024-04-08 (×3): 1 [IU] via SUBCUTANEOUS
  Administered 2024-04-08: 2 [IU] via SUBCUTANEOUS
  Administered 2024-04-08 – 2024-04-09 (×2): 1 [IU] via SUBCUTANEOUS
  Administered 2024-04-09: 3 [IU] via SUBCUTANEOUS
  Administered 2024-04-10 (×2): 2 [IU] via SUBCUTANEOUS

## 2024-04-05 MED ORDER — INSULIN ASPART 100 UNIT/ML IJ SOLN
0.0000 [IU] | Freq: Every day | INTRAMUSCULAR | Status: DC
Start: 2024-04-06 — End: 2024-04-10

## 2024-04-05 NOTE — ED Provider Notes (Signed)
 [place] EMERGENCY DEPARTMENT AT [facility] Provider Note   CSN: [id number] Arrival date & time: 04/05/24  1749     History  Chief Complaint  Patient presents with   Joel Webb is a 78 y.o. male.  Joel Webb is a 78 y.o. male with a history of hypertension, hyperlipidemia, CKD 3, diabetes mellitus, GERD, progressive supernuclear palsy presenting to the emergency department with frequent falls.  Patient is here with his family member who states that he has fallen multiple times in the last week and at least 2 times today.  She states with his PSP he has balance issues that tend to cause his falls.  She states that he normally walks with a walker.  She states that this also affects his swallowing and that he has been having a lot more choking with eating recently and has not been eating as much.  The patient states that he has had no lightheadedness or dizziness no chest pain or shortness of breath prior to the falls.  He denies any fevers, dysuria or hematuria, diarrhea or constipation.  He states that he has been coughing and is coughing up a bit of mucus.  His family states that he currently lives at an assisted living and she feels that he is unsafe to return home due to his falls. His wife is also concerned about L-sided weakness, patient states he feels his left side has been more weak for the past month.  The history is provided by the patient and a relative.  Fall       Home Medications Prior to Admission medications   Medication Sig Start Date End Date Taking? Authorizing Provider  atorvastatin (LIPITOR) 80 MG tablet Take 80 mg by mouth daily.   Yes [provider]  Cholecalciferol 50 MCG (2000 UT) CAPS Take 2,000 Units by mouth daily. 11/20/23  Yes [provider]  DULoxetine (CYMBALTA) 60 MG capsule 60 mg daily. 05/30/20  Yes [provider]  ezetimibe  (ZETIA ) 10 MG tablet Take 1 tablet (10 mg total) by mouth daily.  07/17/22  Yes Maudine Sos, MD  furosemide  (LASIX ) 20 MG tablet Take one tablet daily for 5 days and then twice weekly on Monday's and Thursdays. 03/15/24  Yes Clearnce Curia, NP  furosemide  (LASIX ) 20 MG tablet Take 20 mg by mouth as needed for fluid or edema. Give 1 tablet by mouth as needed for lower extremity swelling.  May take once per week as needed. If needed more than once, contact MD.   Yes [provider]  loperamide (IMODIUM) 2 MG capsule Take 2 mg by mouth every 4 (four) hours as needed for diarrhea or loose stools. 03/05/24  Yes [provider]  neomycin-bacitracin-polymyxin [phone] OINT SMARTSIG:1 sparingly Topical Daily 03/22/24  Yes [provider]  NOVOLIN R 100 UNIT/ML injection INJECT 0.08 MLS(8 UNITS TOTAL) INTO THE SKIN TWICE DAILY BEFORE A MEAL; ALSO INJECT 10 UNITS UNDER THE SKIN ONCE DAILY AT SUPPER Patient taking differently: Inject 0-6 Units into the skin 3 (three) times daily before meals. Sliding scale: 0-149=  0 units 150-200= 4 units 201-999= 6 units 06/07/21  Yes [name], MD  olmesartan  (BENICAR ) 20 MG tablet TAKE ONE TABLET BY MOUTH DAILY; (REPLACING BENICAR  HCT-DOCTOR KUMAR) 10/02/21  Yes [name], MD  omeprazole (PRILOSEC) 20 MG capsule Take 20 mg by mouth daily as needed (acid reflux anti-spasmodics). PRN   Yes [provider]  tamsulosin (FLOMAX) 0.4  MG CAPS capsule Take 0.4 mg by mouth 2 (two) times daily.   Yes [provider]  tirzepatide Florence Hunt) 10 MG/0.5ML Pen Inject 7.5 mg into the skin once a week. Inject 10mg  subcutaneously one time per day on Thursday. 12/26/22  Yes [provider]      Allergies    Patient has no known allergies.    Review of Systems   Review of Systems  Physical Exam Updated Vital Signs BP (!) 128/113 (BP Location: Right Arm) Comment: new bp meds given  Pulse 95   Temp 97.8 F (36.6 C) (Oral)   Resp 20   Wt 78 kg   SpO2 95%   BMI 25.39 kg/m  Physical  Exam Vitals and nursing note reviewed.  Constitutional:      General: He is not in acute distress.    Appearance: Normal appearance.  HENT:     Head: Normocephalic and atraumatic.     Nose: Nose normal.     Mouth/Throat:     Mouth: Mucous membranes are moist.     Pharynx: Oropharynx is clear.  Eyes:     Extraocular Movements: Extraocular movements intact.     Conjunctiva/sclera: Conjunctivae normal.  Neck:     Comments: No midline neck tenderness Cardiovascular:     Rate and Rhythm: Normal rate and regular rhythm.     Heart sounds: Normal heart sounds.  Pulmonary:     Effort: Pulmonary effort is normal.     Breath sounds: Normal breath sounds.  Abdominal:     General: Abdomen is flat.     Palpations: Abdomen is soft.     Tenderness: There is no abdominal tenderness.  Musculoskeletal:        General: Normal range of motion.     Cervical back: Normal range of motion and neck supple.  Skin:    General: Skin is warm and dry.  Neurological:     General: No focal deficit present.     Mental Status: He is alert and oriented to person, place, and time.     Comments: No visible facial droop No drift in all 4 extremities Sensation intact in all 4 extremities Normal finger-to-nose bilaterally  Psychiatric:        Mood and Affect: Mood normal.        Behavior: Behavior normal.     ED Results / Procedures / Treatments   Labs (all labs ordered are listed, but only abnormal results are displayed) Labs Reviewed  COMPREHENSIVE METABOLIC PANEL WITH GFR - Abnormal; Notable for the following components:      Result Value   Glucose, Bld 176 (*)    BUN 25 (*)    Creatinine, Ser 1.46 (*)    Albumin 3.4 (*)    GFR, Estimated 49 (*)    All other components within normal limits  CBC WITH DIFFERENTIAL/PLATELET - Abnormal; Notable for the following components:   Platelets 143 (*)    All other components within normal limits  URINALYSIS, W/ REFLEX TO CULTURE (INFECTION SUSPECTED) -  Abnormal; Notable for the following components:   Glucose, UA >=500 (*)    Hgb urine dipstick SMALL (*)    Protein, ur 100 (*)    All other components within normal limits  BRAIN NATRIURETIC PEPTIDE - Abnormal; Notable for the following components:   B Natriuretic Peptide 878.5 (*)    All other components within normal limits  CBC - Abnormal; Notable for the following components:   Platelets 143 (*)  All other components within normal limits  BASIC METABOLIC PANEL WITH GFR - Abnormal; Notable for the following components:   Glucose, Bld 147 (*)    Creatinine, Ser 1.32 (*)    Calcium 8.8 (*)    GFR, Estimated 56 (*)    All other components within normal limits  GLUCOSE, CAPILLARY - Abnormal; Notable for the following components:   Glucose-Capillary 129 (*)    All other components within normal limits  GLUCOSE, CAPILLARY - Abnormal; Notable for the following components:   Glucose-Capillary 153 (*)    All other components within normal limits  GLUCOSE, CAPILLARY - Abnormal; Notable for the following components:   Glucose-Capillary 119 (*)    All other components within normal limits  COMPREHENSIVE METABOLIC PANEL WITH GFR - Abnormal; Notable for the following components:   CO2 20 (*)    Glucose, Bld 165 (*)    Creatinine, Ser 1.25 (*)    Calcium 8.7 (*)    Total Protein 6.0 (*)    Albumin 3.1 (*)    AST 14 (*)    GFR, Estimated 59 (*)    All other components within normal limits  CBC - Abnormal; Notable for the following components:   Platelets 130 (*)    All other components within normal limits  GLUCOSE, CAPILLARY - Abnormal; Notable for the following components:   Glucose-Capillary 161 (*)    All other components within normal limits  GLUCOSE, CAPILLARY - Abnormal; Notable for the following components:   Glucose-Capillary 153 (*)    All other components within normal limits  GLUCOSE, CAPILLARY - Abnormal; Notable for the following components:   Glucose-Capillary 200  (*)    All other components within normal limits  GLUCOSE, CAPILLARY - Abnormal; Notable for the following components:   Glucose-Capillary 133 (*)    All other components within normal limits  CBG MONITORING, ED - Abnormal; Notable for the following components:   Glucose-Capillary 154 (*)    All other components within normal limits  MRSA NEXT GEN BY PCR, NASAL  HEMOGLOBIN A1C  MAGNESIUM  PHOSPHORUS    EKG EKG Interpretation Date/Time:  Monday Apr 05 2024 17:57:42 EDT Ventricular Rate:  82 PR Interval:  192 QRS Duration:  100 QT Interval:  402 QTC Calculation: 469 R Axis:   27  Text Interpretation: Normal sinus rhythm Incomplete right bundle branch block Cannot rule out Anteroseptal infarct , age undetermined Abnormal ECG  PVCs resolved compared to prior EKG Confirmed by Celesta Coke ([id number]) on 04/05/2024 8:40:29 PM  Radiology MR BRAIN WO CONTRAST Result Date: 04/06/2024 CLINICAL DATA:  Initial evaluation for mental status change, unknown cause. EXAM: MRI HEAD WITHOUT CONTRAST TECHNIQUE: Multiplanar, multiecho pulse sequences of the brain and surrounding structures were obtained without intravenous contrast. COMPARISON:  CT from 04/05/2024 FINDINGS: Brain: Mild age-related cerebral atrophy. Patchy T2/FLAIR hyperintensity involving the supratentorial cerebral white matter and pons, consistent with chronic small vessel ischemic disease, mild in nature. Remote lacunar infarct present at the right basal ganglia. No evidence for acute or subacute infarct. No acute or chronic intracranial blood products. No mass lesion, midline shift or mass effect. No hydrocephalus or extra-axial fluid collection. Pituitary gland within normal limits. Vascular: Major intracranial vascular flow voids are maintained. Skull and upper cervical spine: Craniocervical junction with normal limits. Bone marrow signal intensity normal. No scalp soft tissue abnormality. Sinuses/Orbits: Globes orbital soft tissues  within normal limits. Small right maxillary sinus retention cyst noted. Paranasal sinuses are otherwise clear. No mastoid effusion.  Other: None. IMPRESSION: 1. No acute intracranial abnormality. 2. Mild age-related cerebral atrophy with chronic small vessel ischemic disease, with remote lacunar infarct at the right basal ganglia. Electronically Signed   By: Virgia Griffins M.D.   On: 04/06/2024 18:23   ECHOCARDIOGRAM COMPLETE Result Date: 04/06/2024    ECHOCARDIOGRAM REPORT   Patient Name:   Joel Webb Date of Exam: 04/06/2024 Medical Rec #:  [id number]        Height:       69.0 in Accession #:    [id number]       Weight:       182.0 lb Date of Birth:  [birth year]       BSA:          1.985 m Patient Age:    77 years         BP:           107/59 mmHg Patient Gender: M                HR:           87 bpm. Exam Location:  Inpatient Procedure: 2D Echo, 3D Echo, Color Doppler and Cardiac Doppler (Both Spectral            and Color Flow Doppler were utilized during procedure). Indications:    CHF-acute diastolic  History:        Patient has no prior history of Echocardiogram examinations.                 Arrythmias:PVC; Risk Factors:Diabetes, Dyslipidemia and                 Hypertension. CKD stage 3.  Sonographer:    Juanita Shaw Referring Phys: [id number] CAROLE N HALL  Sonographer Comments: Image acquisition challenging due to respiratory motion. IMPRESSIONS  1. Left ventricular ejection fraction, by estimation, is 35 to 40%. The left ventricle has moderately decreased function. Left ventricular endocardial border not optimally defined to evaluate regional wall motion. The left ventricular internal cavity size was mildly dilated. Left ventricular diastolic parameters are indeterminate.  2. Right ventricular systolic function is normal. The right ventricular size is normal. Tricuspid regurgitation signal is inadequate for assessing PA pressure.  3. The mitral valve is normal in structure. Trivial mitral valve  regurgitation. No evidence of mitral stenosis.  4. The aortic valve is tricuspid. Aortic valve regurgitation is trivial. No aortic stenosis is present.  5. The inferior vena cava is normal in size with greater than 50% respiratory variability, suggesting right atrial pressure of 3 mmHg. Comparison(s): Patient did not have IV access for definity microbubble contrast. Images are technically difficult and estimation of left ventricular systolic function is of low confidence. Conclusion(s)/Recommendation(s): The study should be repeated with Definity contrast or an alternative imaging modality should be used. FINDINGS  Left Ventricle: Left ventricular ejection fraction, by estimation, is 35 to 40%. The left ventricle has moderately decreased function. Left ventricular endocardial border not optimally defined to evaluate regional wall motion. The left ventricular internal cavity size was mildly dilated. There is no left ventricular hypertrophy. Left ventricular diastolic function could not be evaluated due to nondiagnostic images. Left ventricular diastolic parameters are indeterminate. Right Ventricle: The right ventricular size is normal. No increase in right ventricular wall thickness. Right ventricular systolic function is normal. Tricuspid regurgitation signal is inadequate for assessing PA pressure. Left Atrium: Left atrial size was normal in size. Right Atrium: Right atrial size was normal in size. Pericardium:  There is no evidence of pericardial effusion. Mitral Valve: The mitral valve is normal in structure. Trivial mitral valve regurgitation. No evidence of mitral valve stenosis. MV peak gradient, 2.3 mmHg. The mean mitral valve gradient is 1.0 mmHg. Tricuspid Valve: The tricuspid valve is normal in structure. Tricuspid valve regurgitation is not demonstrated. No evidence of tricuspid stenosis. Aortic Valve: The aortic valve is tricuspid. Aortic valve regurgitation is trivial. No aortic stenosis is present.  Aortic valve mean gradient measures 2.0 mmHg. Aortic valve peak gradient measures 3.1 mmHg. Aortic valve area, by VTI measures 2.52 cm. Pulmonic Valve: The pulmonic valve was normal in structure. Pulmonic valve regurgitation is not visualized. No evidence of pulmonic stenosis. Aorta: The aortic root is normal in size and structure. Venous: The inferior vena cava is normal in size with greater than 50% respiratory variability, suggesting right atrial pressure of 3 mmHg. IAS/Shunts: No atrial level shunt detected by color flow Doppler.  LEFT VENTRICLE PLAX 2D LVIDd:         5.70 cm      Diastology LVIDs:         4.90 cm      LV e' medial:    7.40 cm/s LV PW:         1.20 cm      LV E/e' medial:  7.8 LV IVS:        0.90 cm      LV e' lateral:   12.10 cm/s LVOT diam:     2.30 cm      LV E/e' lateral: 4.8 LV SV:         34 LV SV Index:   17 LVOT Area:     4.15 cm  LV Volumes (MOD) LV vol d, MOD A2C: 139.0 ml LV vol d, MOD A4C: 210.0 ml LV vol s, MOD A2C: 60.9 ml LV vol s, MOD A4C: 112.0 ml LV SV MOD A2C:     78.1 ml LV SV MOD A4C:     210.0 ml LV SV MOD BP:      85.9 ml RIGHT VENTRICLE             IVC RV Basal diam:  3.40 cm     IVC diam: 1.20 cm RV Mid diam:    2.80 cm RV S prime:     11.80 cm/s TAPSE (M-mode): 1.8 cm LEFT ATRIUM             Index        RIGHT ATRIUM           Index LA diam:        4.20 cm 2.12 cm/m   RA Area:     10.50 cm LA Vol (A2C):   72.5 ml 36.53 ml/m  RA Volume:   21.20 ml  10.68 ml/m LA Vol (A4C):   35.5 ml 17.89 ml/m LA Biplane Vol: 51.2 ml 25.80 ml/m  AORTIC VALVE                    PULMONIC VALVE AV Area (Vmax):    2.85 cm     PV Vmax:       0.75 m/s AV Area (Vmean):   2.12 cm     PV Peak grad:  2.2 mmHg AV Area (VTI):     2.52 cm AV Vmax:           88.50 cm/s AV Vmean:          62.500 cm/s AV  VTI:            0.136 m AV Peak Grad:      3.1 mmHg AV Mean Grad:      2.0 mmHg LVOT Vmax:         60.70 cm/s LVOT Vmean:        31.900 cm/s LVOT VTI:          0.083 m LVOT/AV VTI ratio: 0.61   AORTA Ao Root diam: 3.70 cm Ao Asc diam:  3.65 cm MITRAL VALVE MV Area (PHT): 4.06 cm    SHUNTS MV Area VTI:   2.38 cm    Systemic VTI:  0.08 m MV Peak grad:  2.3 mmHg    Systemic Diam: 2.30 cm MV Mean grad:  1.0 mmHg MV Vmax:       0.76 m/s MV Vmean:      44.9 cm/s MV Decel Time: 187 msec MV E velocity: 57.80 cm/s MV A velocity: 46.00 cm/s MV E/A ratio:  1.26 Mihai Croitoru MD Electronically signed by Luana Rumple MD Signature Date/Time: 04/06/2024/5:52:07 PM    Final    DG Swallowing Func-Speech Pathology Result Date: 04/06/2024 Table formatting from the original result was not included. Modified Barium Swallow Study Patient Details Name: Joel Webb MRN: [id number] Date of Birth: 1946/10/12 Today's Date: 04/06/2024 HPI/PMH: HPI: Pt is a 78 yo male presenting from ALF with frequent falls and choking episodes. MBS in 2023 revealed minimal oropharyngeal dysphagia with aspiration of thin liquids on sequential sips (PAS 7, sensed but not cleared by cough). PMH includes: PSP, GERD, MCI, HTN, HLD, CKD, DMII Clinical Impression: Pt has an oropharyngeal dysphagia with reduced coordination that contributes to aspiration with thin and nectar thick liquids. His oral phase is marked by repetitive/disorganized lingual motion, disorganized chewing with small pieces of cracker not fully masticated before swallowing, and inconsistent oral clearance. Premature spillage was noted, as was piecemeal swallowing at times, making amount of oral residue quite variable, although ultimately he cleared his mouth well. The barium tablet could not be orally transited though and was manually removed. Reduced oral coordination resulted in inconsistent site of initiation, with liquids often spilling into his airway before airway closure. Aspiration of thin and nectar thick liquids is inconsistently sensed (PAS 7 and 8), and even when he does cough, he does not clear his airway. Coughing sometimes appears to be delayed, and sometimes he  coughs without overt connection to aspiration, which could be indicative of further delayed sensation. Pharyngeally there is reduced soft palate elevation, anterior hyoid movement, laryngeal vestibule closure, epiglottic movement, and pharyngeal squeeze that contribute to both reduced safety and efficiency. A collection of residue remains primarily in the valleculae. Trace penetration, sometimes from this residue, occurs after the swallow with honey thick liquids. Compensatory strategies tested include oral hold, chin tuck, and use of straw, but they did not significantly improve airway protection. Small, single sips (cued to "sip on hot coffee") resulted in better airway protection with thin liquids (penetration x1) but not with nectar thick liquids (PAS 7). Discussed findings with pt and spouse, who was present for the study. We discussed a more conservative approach, including honey thick liquids (with water protocol) as this did not result in aspiration even with larger volumes and self-pacing. An alternative option would be to remain on thin liquids, as this is his baseline diet and he appears to have been tolerating it with no pulmonary issues despite signs of dysphagia for months. If on thin liquids, would  consider a Provale cup to regulate bolus size until he can more consistently take small, single sips. Either option could include f/u SLP for swallowing therapy to optimize function. At this time, pt and his wife would like to remain on mechanical soft diet and thin liquids, with careful use of strategies to reduce but not eliminate aspiration risk. Factors that may increase risk of adverse event in presence of aspiration [name] & [name] 2021): Factors that may increase risk of adverse event in presence of aspiration [name] & [name] 2021): Reduced cognitive function; Limited mobility; Weak cough; Aspiration of thick, dense, and/or acidic materials Recommendations/Plan: Swallowing Evaluation  Recommendations Swallowing Evaluation Recommendations Recommendations: PO diet PO Diet Recommendation: Dysphagia 3 (Mechanical soft); Thin liquids (Level 0) Liquid Administration via: Cup; Other (Comment) (provale cup) Medication Administration: Whole meds with puree Supervision: Patient able to self-feed; Full supervision/cueing for swallowing strategies Swallowing strategies  : Minimize environmental distractions; Slow rate; Small bites/sips (one small sip at a time, like you are "sipping on hot coffee" (can use Provale cup)) Postural changes: Position pt fully upright for meals Oral care recommendations: Oral care QID (4x/day) Treatment Plan Treatment Plan Treatment recommendations: Therapy as outlined in treatment plan below Follow-up recommendations: Skilled nursing-short term rehab (<3 hours/day) Functional status assessment: Patient has had a recent decline in their functional status and demonstrates the ability to make significant improvements in function in a reasonable and predictable amount of time. Treatment frequency: Min 2x/week Treatment duration: 2 weeks Interventions: Aspiration precaution training; Compensatory techniques; Patient/family education; Trials of upgraded texture/liquids; Respiratory muscle strength training Recommendations Recommendations for follow up therapy are one component of a multi-disciplinary discharge planning process, led by the attending physician.  Recommendations may be updated based on patient status, additional functional criteria and insurance authorization. Assessment: Orofacial Exam: Orofacial Exam Oral Cavity: Oral Hygiene: WFL Oral Cavity - Dentition: Adequate natural dentition; Other (Comment) (partial) Orofacial Anatomy: WFL Anatomy: Anatomy: WFL Boluses Administered: Boluses Administered Boluses Administered: Thin liquids (Level 0); Mildly thick liquids (Level 2, nectar thick); Moderately thick liquids (Level 3, honey thick); Puree; Solid  Oral Impairment  Domain: Oral Impairment Domain Lip Closure: No labial escape (as can be observed - limited view throughout the study) Tongue control during bolus hold: Posterior escape of less than half of bolus Bolus preparation/mastication: Disorganized chewing/mashing with solid pieces of bolus unchewed Bolus transport/lingual motion: Repetitive/disorganized tongue motion Oral residue: Majority of bolus remaining Location of oral residue : Floor of mouth; Tongue; Lateral sulci Initiation of pharyngeal swallow : Pyriform sinuses  Pharyngeal Impairment Domain: Pharyngeal Impairment Domain Soft palate elevation: Trace column of contrast or air between SP and PW Laryngeal elevation: Complete superior movement of thyroid  cartilage with complete approximation of arytenoids to epiglottic petiole Anterior hyoid excursion: Partial anterior movement Epiglottic movement: Partial inversion Laryngeal vestibule closure: Incomplete, narrow column air/contrast in laryngeal vestibule Pharyngeal stripping wave : Present - diminished Pharyngeal contraction (A/P view only): N/A Pharyngoesophageal segment opening: Complete distension and complete duration, no obstruction of flow Tongue base retraction: No contrast between tongue base and posterior pharyngeal wall (PPW) Pharyngeal residue: Collection of residue within or on pharyngeal structures Location of pharyngeal residue: Tongue base; Valleculae  Esophageal Impairment Domain: Esophageal Impairment Domain Esophageal clearance upright position: Complete clearance, esophageal coating Pill: Pill Consistency administered: Puree Puree: Impaired (see clinical impressions) Penetration/Aspiration Scale Score: Penetration/Aspiration Scale Score 1.  Material does not enter airway: Puree 3.  Material enters airway, remains ABOVE vocal cords and not ejected out: Solid 5.  Material  enters airway, CONTACTS cords and not ejected out: Moderately thick liquids (Level 3, honey thick) 8.  Material enters airway,  passes BELOW cords without attempt by patient to eject out (silent aspiration) : Thin liquids (Level 0); Mildly thick liquids (Level 2, nectar thick) Compensatory Strategies: Compensatory Strategies Compensatory strategies: Yes Straw: Ineffective Ineffective Straw: Thin liquid (Level 0) Chin tuck: Ineffective Ineffective Chin Tuck: Thin liquid (Level 0) Oral bolus hold: Ineffective Ineffective Oral Bolus Hold : Mildly thick liquid (Level 2, nectar thick)   General Information: Caregiver present: Yes (wife)  Diet Prior to this Study: Dysphagia 3 (mechanical soft); Other (Comment) (with sips of water)   Temperature : Normal   Respiratory Status: WFL   Supplemental O2: None (Room air)   History of Recent Intubation: No  Behavior/Cognition: Alert; Cooperative; Pleasant mood Self-Feeding Abilities: Able to self-feed Baseline vocal quality/speech: Normal (speech dysarthric) Volitional Cough: Able to elicit Volitional Swallow: Able to elicit Exam Limitations: No limitations Goal Planning: Prognosis for improved oropharyngeal function: Good No data recorded No data recorded Patient/Family Stated Goal: want his swallowing assessed, wants him to get more help upon discharge Consulted and agree with results and recommendations: Patient; Family member/caregiver Pain: Pain Assessment Pain Assessment: Faces Faces Pain Scale: 0 End of Session: Start Time:SLP Start Time (ACUTE ONLY): 1257 Stop Time: SLP Stop Time (ACUTE ONLY): 1328 Time Calculation:SLP Time Calculation (min) (ACUTE ONLY): 31 min Charges: SLP Evaluations $ SLP Speech Visit: 1 Visit SLP Evaluations $BSS Swallow: 1 Procedure $MBS Swallow: 1 Procedure SLP visit diagnosis: SLP Visit Diagnosis: Dysphagia, unspecified (R13.10) Past Medical History: Past Medical History: Diagnosis Date  Chronic kidney disease, stage 3 01/11/2021  ED (erectile dysfunction)   Essential hypertension 06/05/2017  Frequent falls   Gait abnormality   Gastroesophageal reflux disease 01/11/2021   Major depressive disorder   Microalbuminuria due to type 2 diabetes mellitus 05/25/2020  Mild cognitive impairment of uncertain or unknown etiology   Mixed hyperlipidemia 02/28/2016  Multiple lacunar infarcts   Plantar fasciitis   Prostatitis   Pure hypercholesterolemia 01/11/2021  PVC's (premature ventricular contractions) 01/28/2022  Uncontrolled type 2 diabetes mellitus with hyperglycemia, with long-term current use of insulin  02/28/2016 Past Surgical History: Past Surgical History: Procedure Laterality Date  CIRCUMCISION    COLONOSCOPY    MOUTH SURGERY  01/2020 [name]., M.A. CCC-SLP Acute Rehabilitation Services Office: [phone] Secure chat preferred 04/06/2024, 2:36 PM  DG Chest 1 View Result Date: 04/05/2024 CLINICAL DATA:  Fall EXAM: CHEST  1 VIEW COMPARISON:  03/02/2024 FINDINGS: Borderline to mild cardiomegaly with small pleural effusions and vascular congestion similar compared to prior. Patchy atelectasis or infiltrate at the left base. No pneumothorax IMPRESSION: Borderline to mild cardiomegaly with small pleural effusions and vascular congestion. Patchy atelectasis or infiltrate at the left base. Electronically Signed   By: Esmeralda Hedge M.D.   On: 04/05/2024 22:10   CT Head Wo Contrast Result Date: 04/05/2024 CLINICAL DATA:  Multiple falls EXAM: CT HEAD WITHOUT CONTRAST CT CERVICAL SPINE WITHOUT CONTRAST TECHNIQUE: Multidetector CT imaging of the head and cervical spine was performed following the standard protocol without intravenous contrast. Multiplanar CT image reconstructions of the cervical spine were also generated. RADIATION DOSE REDUCTION: This exam was performed according to the departmental dose-optimization program which includes automated exposure control, adjustment of the mA and/or kV according to patient size and/or use of iterative reconstruction technique. COMPARISON:  03/02/2024 FINDINGS: CT HEAD FINDINGS Brain: No evidence of acute infarction, hemorrhage, hydrocephalus,  extra-axial collection or mass lesion/mass effect. Periventricular  white matter hypodensity. Lacunar infarction of the right lentiform nuclei. Vascular: No hyperdense vessel or unexpected calcification. Skull: Normal. Negative for fracture or focal lesion. Sinuses/Orbits: No acute finding. Other: None. CT CERVICAL SPINE FINDINGS Alignment: Normal. Skull base and vertebrae: No acute fracture. No primary bone lesion or focal pathologic process. Soft tissues and spinal canal: No prevertebral fluid or swelling. No visible canal hematoma. Disc levels: Focally moderate disc space height loss of C6-C7 with otherwise intact disc spaces. Upper chest: Negative. Other: None. IMPRESSION: 1. No acute intracranial pathology. Small-vessel white matter disease and old lacunar infarction of the right lentiform nuclei. 2. No fracture or static subluxation of the cervical spine. 3. Focally moderate disc space height loss of C6-C7 with otherwise intact disc spaces. Electronically Signed   By: Fredricka Jenny M.D.   On: 04/05/2024 21:58   CT Cervical Spine Wo Contrast Result Date: 04/05/2024 CLINICAL DATA:  Multiple falls EXAM: CT HEAD WITHOUT CONTRAST CT CERVICAL SPINE WITHOUT CONTRAST TECHNIQUE: Multidetector CT imaging of the head and cervical spine was performed following the standard protocol without intravenous contrast. Multiplanar CT image reconstructions of the cervical spine were also generated. RADIATION DOSE REDUCTION: This exam was performed according to the departmental dose-optimization program which includes automated exposure control, adjustment of the mA and/or kV according to patient size and/or use of iterative reconstruction technique. COMPARISON:  03/02/2024 FINDINGS: CT HEAD FINDINGS Brain: No evidence of acute infarction, hemorrhage, hydrocephalus, extra-axial collection or mass lesion/mass effect. Periventricular white matter hypodensity. Lacunar infarction of the right lentiform nuclei. Vascular: No hyperdense  vessel or unexpected calcification. Skull: Normal. Negative for fracture or focal lesion. Sinuses/Orbits: No acute finding. Other: None. CT CERVICAL SPINE FINDINGS Alignment: Normal. Skull base and vertebrae: No acute fracture. No primary bone lesion or focal pathologic process. Soft tissues and spinal canal: No prevertebral fluid or swelling. No visible canal hematoma. Disc levels: Focally moderate disc space height loss of C6-C7 with otherwise intact disc spaces. Upper chest: Negative. Other: None. IMPRESSION: 1. No acute intracranial pathology. Small-vessel white matter disease and old lacunar infarction of the right lentiform nuclei. 2. No fracture or static subluxation of the cervical spine. 3. Focally moderate disc space height loss of C6-C7 with otherwise intact disc spaces. Electronically Signed   By: Fredricka Jenny M.D.   On: 04/05/2024 21:58    Procedures Procedures    Medications Ordered in ED Medications  enoxaparin  (LOVENOX ) injection 40 mg (40 mg Subcutaneous Given 04/07/24 0818)  acetaminophen  (TYLENOL ) tablet 650 mg (has no administration in time range)  prochlorperazine  (COMPAZINE ) injection 5 mg (has no administration in time range)  melatonin tablet 5 mg (5 mg Oral Given 04/06/24 2119)  polyethylene glycol (MIRALAX  / GLYCOLAX ) packet 17 g (has no administration in time range)  insulin  aspart (novoLOG ) injection 0-9 Units (1 Units Subcutaneous Given 04/07/24 1214)  insulin  aspart (novoLOG ) injection 0-5 Units ( Subcutaneous Not Given 04/06/24 2125)  lactated ringers  infusion ( Intravenous Patient Refused/Not Given 04/06/24 0057)  atorvastatin (LIPITOR) tablet 80 mg (80 mg Oral Given 04/07/24 0819)  ezetimibe  (ZETIA ) tablet 10 mg (10 mg Oral Given 04/07/24 0819)  metoprolol tartrate (LOPRESSOR) tablet 12.5 mg (12.5 mg Oral Given 04/07/24 1523)  losartan (COZAAR) tablet 25 mg (25 mg Oral Given 04/07/24 1523)  spironolactone (ALDACTONE) tablet 12.5 mg (12.5 mg Oral Given 04/07/24 1523)     ED Course/ Medical Decision Making/ A&P Clinical Course as of 04/07/24 1556  Mon Apr 05, 2024  2210 No acute traumatic injury on CT  imaging. [VK]  2239 CXR with findings of mild CHF. [VK]  2310 CMP within normal range. Will discuss with hospitalist for admission for frequent falls.  [VK]    Clinical Course User Index [VK] Kingsley, Mazikeen Hehn K, DO                                 Medical Decision Making This patient presents to the ED with chief complaint(s) of frequent falls with pertinent past medical history of HTN, HLD, CKD, DM, GERD, PSP which further complicates the presenting complaint. The complaint involves an extensive differential diagnosis and also carries with it a high risk of complications and morbidity.    The differential diagnosis includes ICH, mass effect, aspiration, pneumonia, pneumothorax, pulmonary edema, pleural effusion, infection, electrolyte derangement, no other traumatic injury seen on exam, no focal neurologic deficits making CVA less likely  Additional history obtained: Additional history obtained from spouse Records reviewed outpatient cardiology records  ED Course and Reassessment: On patient's arrival he is hemodynamically stable in no acute distress.  Patient EKG on arrival that showed normal sinus rhythm without acute ischemic changes.  Patient will have labs including urine to evaluate for etiology of his falls as well as a head CT, C-spine and chest x-ray to evaluate for any injury or signs of aspiration.  He will be closely reassessed.  Independent labs interpretation:  The following labs were independently interpreted: at baseline/no acute abnormality  Independent visualization of imaging: - I independently visualized the following imaging with scope of interpretation limited to determining acute life threatening conditions related to emergency care: CTH/C-spine, CXR, which revealed no traumatic injury, mild pulm edema  Consultation: -  Consulted or discussed management/test interpretation w/ external professional: hospitalist  Consideration for admission or further workup: patient requires admission for frequent falls Social Determinants of health: N/A    Amount and/or Complexity of Data Reviewed Labs: ordered. Radiology: ordered.  Risk Decision regarding hospitalization.          Final Clinical Impression(s) / ED Diagnoses Final diagnoses:  Frequent falls  Choking, initial encounter    Rx / DC Orders ED Discharge Orders     None         Kingsley, Damoni Erker K, DO 04/07/24 1556

## 2024-04-05 NOTE — H&P (Signed)
 History and Physical  Joel Webb DOB: Apr 16, 1946 DOA: 04/05/2024  Referring physician: Dr. Nora Beal, EDP  PCP: Rae Bugler, MD  Outpatient Specialists: Cardiology. Patient coming from: Assisted living facility.  Chief Complaint: Frequent falls.  HPI: Joel Webb is a 78 y.o. male with medical history significant for essential hypertension, hyperlipidemia, CKD 3B, type 2 diabetes, GERD, progressive supranuclear palsy affecting walking, balance and movement swallowing and cognitive deficits, who presents to the ER via EMS from ALF with frequent falls and choking episodes, which have led him to not eat as well.  No reported subjective fevers or chills.  In the ER, weak appearing, reportedly, the patient has been falling multiple times in the last week and at least 2 times today.  At baseline ambulates with a walker.  Has also been having more episodes of dysphagia.  No reported fevers or chills.  Endorses worsening left-sided weakness for about a month.  Noncontrast head CT and cervical spine did not show any acute intracranial pathology.  It revealed small vessel white matter disease and old lacunar infarction of the right lentiform nuclei.  No fracture or static subluxation of the cervical spine.  Focally moderate disc space height loss of C6-C7 with otherwise intact disc spaces.  Chest x-ray revealed borderline to mild cardiomegaly with small pleural effusions and vascular congestion.  Patchy atelectasis or infiltrates at the left base.  BNP was elevated greater than 800.  With associated lower extremity edema, concern for possible acute CHF.  The patient is on p.o. Lasix  20 mg weekly, on Mondays.  Due to concern for unsafe discharge from the ER, EDP requested admission for further management of dysphagia, falls, and acute CHF.  Admitted by [facility], hospitalist service.  ED Course: Temperature 99.7.  BP 145/93, pulse 114, respiratory rate 28, O2 saturation 94% on room  air.  CBC essentially unremarkable except for, cytopenia 143K.  CMP remarkable for serum glucose 176, BUN 25, creatinine 1.46, albumin 3.4, GFR 49, BNP 878.  Review of Systems: Review of systems as noted in the HPI. All other systems reviewed and are negative.   Past Medical History:  Diagnosis Date   Chronic kidney disease, stage 3 01/11/2021   ED (erectile dysfunction)    Essential hypertension 06/05/2017   Frequent falls    Gait abnormality    Gastroesophageal reflux disease 01/11/2021   Major depressive disorder    Microalbuminuria due to type 2 diabetes mellitus 05/25/2020   Mild cognitive impairment of uncertain or unknown etiology    Mixed hyperlipidemia 02/28/2016   Multiple lacunar infarcts    Plantar fasciitis    Prostatitis    Pure hypercholesterolemia 01/11/2021   PVC's (premature ventricular contractions) 01/28/2022   Uncontrolled type 2 diabetes mellitus with hyperglycemia, with long-term current use of insulin  02/28/2016   Past Surgical History:  Procedure Laterality Date   CIRCUMCISION     COLONOSCOPY     MOUTH SURGERY  01/2020    Social History:  reports that he quit smoking about 15 years ago. His smoking use included cigarettes. He has never used smokeless tobacco. He reports current alcohol use. He reports that he does not use drugs.   No Known Allergies  Family History  Problem Relation Age of Onset   Hepatitis C Mother    Mesothelioma Father    Diabetes Father    Heart disease Father       Prior to Admission medications   Medication Sig Start Date End Date Taking? Authorizing Provider  amLODipine (NORVASC) 5 MG tablet Take 5 mg by mouth daily.    [provider]  atorvastatin (LIPITOR) 80 MG tablet Take 80 mg by mouth daily.    [provider]  BD INSULIN  SYRINGE U/F 31G X 5/16" 1 ML MISC USE TO GIVE 2 INSULIN  INJECTIONS DAILY AS DIRECTED BY YOUR DOCTOR 01/23/22   [name], MD  Blood Glucose Monitoring Suppl (ACCU-CHEK GUIDE)  w/Device KIT Use to check blood sugar once a day Patient not taking: Reported on 03/12/2024 03/29/21   [name], MD  DROPLET PEN NEEDLES 32G X 4 MM MISC USE ON INSULIN  PEN FOUR TIMES A DAY           NEED FOLLOW UP APPOINTMENT 02/15/23   [name], MD  DULoxetine (CYMBALTA) 60 MG capsule 60 mg daily. 05/30/20   [provider]  ezetimibe  (ZETIA ) 10 MG tablet Take 1 tablet (10 mg total) by mouth daily. 07/17/22   Maudine Sos, MD  finasteride (PROSCAR) 5 MG tablet  01/02/18   [provider]  furosemide  (LASIX ) 20 MG tablet Take one tablet daily for 5 days and then twice weekly on Monday's and Thursdays. 03/15/24   Clearnce Curia, NP  glucose blood (ACCU-CHEK GUIDE) test strip Use as instructed to check blood sugar once a day dx code E11.65 Patient not taking: Reported on 03/12/2024 01/11/22   [name], MD  insulin  degludec (TRESIBA  FLEXTOUCH) 200 UNIT/ML FlexTouch Pen INJECT 40 UNITS UNDER THE SKIN DAILY 10/09/22   [name], MD  MOUNJARO 7.5 MG/0.5ML Pen Inject 7.5 mg into the skin once a week. 12/26/22   [provider]  Multiple Vitamin (MULTIVITAMIN WITH MINERALS) TABS tablet Take 1 tablet by mouth daily.    [provider]  NOVOLIN R 100 UNIT/ML injection INJECT 0.08 MLS(8 UNITS TOTAL) INTO THE SKIN TWICE DAILY BEFORE A MEAL; ALSO INJECT 10 UNITS UNDER THE SKIN ONCE DAILY AT SUPPER Patient taking differently: 6units am, 8-10units lunch, 12 units at supper 06/07/21   [name], MD  olmesartan  (BENICAR ) 20 MG tablet TAKE ONE TABLET BY MOUTH DAILY; (REPLACING BENICAR  HCT-DOCTOR Joel Webb) 10/02/21   [name], MD  Omega-3 Fatty Acids (FISH OIL) 1000 MG CAPS Take by mouth. 2x/day    [provider]  omeprazole (PRILOSEC) 20 MG capsule Take 20 mg by mouth every other day. PRN    [provider]  OneTouch Delica Lancets 33G MISC Use to check blood sugar once a day Patient not taking: Reported on 03/12/2024 04/19/22   [name], MD   tamsulosin (FLOMAX) 0.4 MG CAPS capsule Take 0.4 mg by mouth. Reported on 05/29/2016    [provider]    Physical Exam: BP (!) 145/93 (BP Location: Right Arm)   Pulse 93   Temp 97.7 F (36.5 C) (Oral)   Resp 20   SpO2 92%   General: 78 y.o. year-old male Frail in no acute distress.  Alert and oriented x3. Cardiovascular: Regular rate and rhythm with no rubs or gallops.  No thyromegaly or JVD noted.  Lower extremity edema bilaterally. Respiratory: Mild rales at bases.  Poor inspiratory effort. Abdomen: Soft nontender nondistended with normal bowel sounds x4 quadrants. Muskuloskeletal: No cyanosis or clubbing noted bilaterally Neuro: CN II-XII intact, strength, sensation, reflexes Skin: No ulcerative lesions noted or rashes.  Bruising involving left upper extremity. Psychiatry: Judgement and insight appear normal. Mood is appropriate for condition and setting          Labs on Admission:  Basic Metabolic Panel: Recent Labs  Lab 04/05/24 2229  NA 138  K 4.0  CL 107  CO2 22  GLUCOSE 176*  BUN 25*  CREATININE 1.46*  CALCIUM 9.3   Liver Function Tests: Recent Labs  Lab 04/05/24 2229  AST 16  ALT 12  ALKPHOS 98  BILITOT 0.7  PROT 6.5  ALBUMIN 3.4*   No results for input(s): "LIPASE", "AMYLASE" in the last 168 hours. No results for input(s): "AMMONIA" in the last 168 hours. CBC: Recent Labs  Lab 04/05/24 2229  WBC 8.4  NEUTROABS 5.7  HGB 14.4  HCT 42.6  MCV 87.1  PLT 143*   Cardiac Enzymes: No results for input(s): "CKTOTAL", "CKMB", "CKMBINDEX", "TROPONINI" in the last 168 hours.  BNP (last 3 results) Recent Labs    03/12/24 1528 04/05/24 2229  BNP 612.3* 878.5*    ProBNP (last 3 results) No results for input(s): "PROBNP" in the last 8760 hours.  CBG: Recent Labs  Lab 04/06/24 0102  GLUCAP 154*    Radiological Exams on Admission: DG Chest 1 View Result Date: 04/05/2024 CLINICAL DATA:  Fall EXAM: CHEST  1 VIEW COMPARISON:   03/02/2024 FINDINGS: Borderline to mild cardiomegaly with small pleural effusions and vascular congestion similar compared to prior. Patchy atelectasis or infiltrate at the left base. No pneumothorax IMPRESSION: Borderline to mild cardiomegaly with small pleural effusions and vascular congestion. Patchy atelectasis or infiltrate at the left base. Electronically Signed   By: Esmeralda Hedge M.D.   On: 04/05/2024 22:10   CT Head Wo Contrast Result Date: 04/05/2024 CLINICAL DATA:  Multiple falls EXAM: CT HEAD WITHOUT CONTRAST CT CERVICAL SPINE WITHOUT CONTRAST TECHNIQUE: Multidetector CT imaging of the head and cervical spine was performed following the standard protocol without intravenous contrast. Multiplanar CT image reconstructions of the cervical spine were also generated. RADIATION DOSE REDUCTION: This exam was performed according to the departmental dose-optimization program which includes automated exposure control, adjustment of the mA and/or kV according to patient size and/or use of iterative reconstruction technique. COMPARISON:  03/02/2024 FINDINGS: CT HEAD FINDINGS Brain: No evidence of acute infarction, hemorrhage, hydrocephalus, extra-axial collection or mass lesion/mass effect. Periventricular white matter hypodensity. Lacunar infarction of the right lentiform nuclei. Vascular: No hyperdense vessel or unexpected calcification. Skull: Normal. Negative for fracture or focal lesion. Sinuses/Orbits: No acute finding. Other: None. CT CERVICAL SPINE FINDINGS Alignment: Normal. Skull base and vertebrae: No acute fracture. No primary bone lesion or focal pathologic process. Soft tissues and spinal canal: No prevertebral fluid or swelling. No visible canal hematoma. Disc levels: Focally moderate disc space height loss of C6-C7 with otherwise intact disc spaces. Upper chest: Negative. Other: None. IMPRESSION: 1. No acute intracranial pathology. Small-vessel white matter disease and old lacunar infarction of  the right lentiform nuclei. 2. No fracture or static subluxation of the cervical spine. 3. Focally moderate disc space height loss of C6-C7 with otherwise intact disc spaces. Electronically Signed   By: Fredricka Jenny M.D.   On: 04/05/2024 21:58   CT Cervical Spine Wo Contrast Result Date: 04/05/2024 CLINICAL DATA:  Multiple falls EXAM: CT HEAD WITHOUT CONTRAST CT CERVICAL SPINE WITHOUT CONTRAST TECHNIQUE: Multidetector CT imaging of the head and cervical spine was performed following the standard protocol without intravenous contrast. Multiplanar CT image reconstructions of the cervical spine were also generated. RADIATION DOSE REDUCTION: This exam was performed according to the departmental dose-optimization program which includes automated exposure control, adjustment of the mA and/or kV according to patient size and/or use of  iterative reconstruction technique. COMPARISON:  03/02/2024 FINDINGS: CT HEAD FINDINGS Brain: No evidence of acute infarction, hemorrhage, hydrocephalus, extra-axial collection or mass lesion/mass effect. Periventricular white matter hypodensity. Lacunar infarction of the right lentiform nuclei. Vascular: No hyperdense vessel or unexpected calcification. Skull: Normal. Negative for fracture or focal lesion. Sinuses/Orbits: No acute finding. Other: None. CT CERVICAL SPINE FINDINGS Alignment: Normal. Skull base and vertebrae: No acute fracture. No primary bone lesion or focal pathologic process. Soft tissues and spinal canal: No prevertebral fluid or swelling. No visible canal hematoma. Disc levels: Focally moderate disc space height loss of C6-C7 with otherwise intact disc spaces. Upper chest: Negative. Other: None. IMPRESSION: 1. No acute intracranial pathology. Small-vessel white matter disease and old lacunar infarction of the right lentiform nuclei. 2. No fracture or static subluxation of the cervical spine. 3. Focally moderate disc space height loss of C6-C7 with otherwise intact disc  spaces. Electronically Signed   By: Fredricka Jenny M.D.   On: 04/05/2024 21:58    EKG: I independently viewed the EKG done and my findings are as followed: Normal sinus rhythm rate of 82.  Nonspecific ST-T changes.  QTc 469.  Assessment/Plan Present on Admission: **None**  Principal Problem:   Frequent falls  Frequent falls in the setting of progressive supranuclear palsy Follows with neurology PT OT evaluation Fall precautions  Acute CHF, unspecified No history of 2D echo in the past Presented with BNP elevated greater than 800, pulmonary edema, cardiomegaly on chest x-ray, bilateral lower extremity edema. Follow 2D echo Start strict I's and O's and daily weight The patient follows with cardiology outpatient  Oropharyngeal dysphagia Speech therapy evaluation Aspiration precautions Currently on dysphagia 1 diet  Hypertension BP is not at goal, elevated Resume home Norvasc Closely monitor vital signs  Hyperlipidemia Resume home Lipitor and Zetia   Type 2 diabetes with hyperglycemia Hemoglobin A1c 7.6 on 01/08/2022 Insulin  sliding scale for hyperglycemia On dysphagia 1 diet, continue aspiration precautions   Time: 75 minutes.   DVT prophylaxis: Subcu Lovenox  daily.  Code Status: Full code.  Family Communication: None at bedside.  Disposition Plan: Admitted to telemetry medical unit.  Consults called: None.  Admission status: Observation status.   Status is: Observation    Bary Boss MD [facility] Pager [phone]  If 7PM-7AM, please contact night-coverage [email or web address] Password [email or web address]  04/06/2024, 3:17 AM

## 2024-04-05 NOTE — ED Triage Notes (Signed)
 Pt BIB by EMS from [facility] for multiple mechanical falls. Denies head injury or LOC. Pt alert and oriented to baseline. Denies any pain at this time. Sent for medical evaluation.

## 2024-04-06 ENCOUNTER — Observation Stay (HOSPITAL_COMMUNITY)

## 2024-04-06 ENCOUNTER — Observation Stay (HOSPITAL_BASED_OUTPATIENT_CLINIC_OR_DEPARTMENT_OTHER)

## 2024-04-06 DIAGNOSIS — I5031 Acute diastolic (congestive) heart failure: Secondary | ICD-10-CM

## 2024-04-06 DIAGNOSIS — R296 Repeated falls: Secondary | ICD-10-CM | POA: Diagnosis not present

## 2024-04-06 DIAGNOSIS — G319 Degenerative disease of nervous system, unspecified: Secondary | ICD-10-CM | POA: Diagnosis not present

## 2024-04-06 DIAGNOSIS — T17308A Unspecified foreign body in larynx causing other injury, initial encounter: Secondary | ICD-10-CM | POA: Diagnosis not present

## 2024-04-06 DIAGNOSIS — Z8673 Personal history of transient ischemic attack (TIA), and cerebral infarction without residual deficits: Secondary | ICD-10-CM | POA: Diagnosis not present

## 2024-04-06 DIAGNOSIS — I6782 Cerebral ischemia: Secondary | ICD-10-CM | POA: Diagnosis not present

## 2024-04-06 LAB — BASIC METABOLIC PANEL WITH GFR
Anion gap: 6 (ref 5–15)
BUN: 22 mg/dL (ref 8–23)
CO2: 22 mmol/L (ref 22–32)
Calcium: 8.8 mg/dL — ABNORMAL LOW (ref 8.9–10.3)
Chloride: 109 mmol/L (ref 98–111)
Creatinine, Ser: 1.32 mg/dL — ABNORMAL HIGH (ref 0.61–1.24)
GFR, Estimated: 56 mL/min — ABNORMAL LOW (ref >60)
Glucose, Bld: 147 mg/dL — ABNORMAL HIGH (ref 70–99)
Potassium: 3.7 mmol/L (ref 3.5–5.1)
Sodium: 137 mmol/L (ref 135–145)

## 2024-04-06 LAB — ECHOCARDIOGRAM COMPLETE
AR max vel: 2.85 cm2
AV Area VTI: 2.52 cm2
AV Area mean vel: 2.12 cm2
AV Mean grad: 2 mmHg
AV Peak grad: 3.1 mmHg
Ao pk vel: 0.89 m/s
Area-P 1/2: 4.06 cm2
Calc EF: 49 %
MV VTI: 2.38 cm2
S' Lateral: 4.9 cm
Single Plane A2C EF: 56.2 %
Single Plane A4C EF: 46.7 %

## 2024-04-06 LAB — HEMOGLOBIN A1C
Hgb A1c MFr Bld: 5.5 % (ref 4.8–5.6)
Mean Plasma Glucose: 111.15 mg/dL

## 2024-04-06 LAB — PHOSPHORUS: Phosphorus: 3.5 mg/dL (ref 2.5–4.6)

## 2024-04-06 LAB — CBC
HCT: 44.6 % (ref 39.0–52.0)
Hemoglobin: 14.6 g/dL (ref 13.0–17.0)
MCH: 28.6 pg (ref 26.0–34.0)
MCHC: 32.7 g/dL (ref 30.0–36.0)
MCV: 87.5 fL (ref 80.0–100.0)
Platelets: 143 10*3/uL — ABNORMAL LOW (ref 150–400)
RBC: 5.1 MIL/uL (ref 4.22–5.81)
RDW: 14.2 % (ref 11.5–15.5)
WBC: 8.8 10*3/uL (ref 4.0–10.5)
nRBC: 0 % (ref 0.0–0.2)

## 2024-04-06 LAB — GLUCOSE, CAPILLARY
Glucose-Capillary: 129 mg/dL — ABNORMAL HIGH (ref 70–99)
Glucose-Capillary: 153 mg/dL — ABNORMAL HIGH (ref 70–99)
Glucose-Capillary: 119 mg/dL — ABNORMAL HIGH (ref 70–99)
Glucose-Capillary: 161 mg/dL — ABNORMAL HIGH (ref 70–99)

## 2024-04-06 LAB — BRAIN NATRIURETIC PEPTIDE: B Natriuretic Peptide: 878.5 pg/mL — ABNORMAL HIGH (ref 0.0–100.0)

## 2024-04-06 LAB — MRSA NEXT GEN BY PCR, NASAL: MRSA by PCR Next Gen: NOT DETECTED

## 2024-04-06 LAB — MAGNESIUM: Magnesium: 2 mg/dL (ref 1.7–2.4)

## 2024-04-06 LAB — CBG MONITORING, ED: Glucose-Capillary: 154 mg/dL — ABNORMAL HIGH (ref 70–99)

## 2024-04-06 MED ORDER — AMLODIPINE BESYLATE 5 MG PO TABS
5.0000 mg | ORAL_TABLET | Freq: Every day | ORAL | Status: DC
  Administered 2024-04-06 – 2024-04-07 (×2): 5 mg via ORAL
  Filled 2024-04-06 (×2): qty 1

## 2024-04-06 MED ORDER — ATORVASTATIN CALCIUM 80 MG PO TABS
80.0000 mg | ORAL_TABLET | Freq: Every day | ORAL | Status: DC
  Administered 2024-04-06 – 2024-04-10 (×5): 80 mg via ORAL
  Filled 2024-04-06 (×5): qty 1

## 2024-04-06 MED ORDER — METOPROLOL TARTRATE 25 MG PO TABS: 12.5000 mg | ORAL_TABLET | Freq: Two times a day (BID) | ORAL | Status: DC

## 2024-04-06 MED ORDER — EZETIMIBE 10 MG PO TABS
10.0000 mg | ORAL_TABLET | Freq: Every day | ORAL | Status: DC
  Administered 2024-04-06 – 2024-04-10 (×5): 10 mg via ORAL
  Filled 2024-04-06 (×5): qty 1

## 2024-04-06 NOTE — ED Notes (Signed)
 Pt refusing IV start. States, "I dont need all of that". Del Favia, MD notified.

## 2024-04-06 NOTE — Care Management Obs Status (Signed)
 MEDICARE OBSERVATION STATUS NOTIFICATION   Patient Details  Name: Joel Webb MRN: [id number] Date of Birth: 1946-01-29   Medicare Observation Status Notification Given:  Yes Pt wife present as well during explanation of obs status.     Elspeth Hals, LCSW 04/06/2024, 3:00 PM

## 2024-04-06 NOTE — TOC Initial Note (Signed)
 Transition of Care [facility]) - Initial/Assessment Note    Patient Details  Name: Joel Webb MRN: [id number] Date of Birth: 01-29-1946  Transition of Care [facility]) CM/SW Contact:    Elspeth Hals, LCSW Phone Number: 04/06/2024, 3:08 PM  Clinical Narrative:    CSW informed by PT that they will recommend SNF, wife requesting to speak with CSW. PT note pending.   Pt listed as oriented x4, he is able to answer basic questions but most information provided by wife.   Pt from [facility], wife has given notice of plan to leave and admit to [facility] at the end of May.  Plan was to private pay Antonia Battiest while applying for medicaid.    Wife requesting help with medicaid application as well.  Email sent to [place]/Financial counseling.           Expected Discharge Plan: Skilled Nursing Facility Barriers to Discharge: Other (must enter comment) (insurance auth)   Patient Goals and CMS Choice Patient states their goals for this hospitalization and ongoing recovery are:: play golf again   Choice offered to / list presented to : Spouse (wife Libby Ree)      Expected Discharge Plan and Services In-house Referral: Clinical Social Work   Post Acute Care Choice: Skilled Nursing Facility Living arrangements for the past 2 months: Assisted Living Facility ([facility])                                      Prior Living Arrangements/Services Living arrangements for the past 2 months: Assisted Living Facility ([facility]) Lives with:: Facility Resident Patient language and need for interpreter reviewed:: Yes        Need for Family Participation in Patient Care: Yes (Comment) Care giver support system in place?: Yes (comment) Current home services: Other (comment) (na) Criminal Activity/Legal Involvement Pertinent to Current Situation/Hospitalization: No - Comment as needed  Activities of Daily Living   ADL Screening (condition at time of  admission) Independently performs ADLs?: No Does the patient have a NEW difficulty with bathing/dressing/toileting/self-feeding that is expected to last >3 days?: Yes (Initiates electronic notice to provider for possible OT consult) Does the patient have a NEW difficulty with getting in/out of bed, walking, or climbing stairs that is expected to last >3 days?: Yes (Initiates electronic notice to provider for possible PT consult) Does the patient have a NEW difficulty with communication that is expected to last >3 days?: No Is the patient deaf or have difficulty hearing?: Yes Does the patient have difficulty seeing, even when wearing glasses/contacts?: No Does the patient have difficulty concentrating, remembering, or making decisions?: Yes  Permission Sought/Granted                  Emotional Assessment Appearance:: Appears stated age Attitude/Demeanor/Rapport: Engaged Affect (typically observed): Pleasant Orientation: : Oriented to Self, Oriented to Place, Oriented to  Time, Oriented to Situation      Admission diagnosis:  Frequent falls [R29.6] Choking, initial encounter [T17.308A] Patient Active Problem List   Diagnosis Date Noted   Multiple lacunar infarcts    Frequent falls    Gait abnormality    Major depressive disorder 05/30/2022   Mild cognitive impairment of uncertain or unknown etiology    PVC's (premature ventricular contractions) 01/28/2022   Pure hypercholesterolemia 01/11/2021   Gastroesophageal reflux disease 01/11/2021   Chronic kidney disease, stage 3 01/11/2021   Microalbuminuria due to  type 2 diabetes mellitus 05/25/2020   Essential hypertension 06/05/2017   Uncontrolled type 2 diabetes mellitus with hyperglycemia, with long-term current use of insulin  (HCC) 02/28/2016   Mixed hyperlipidemia 02/28/2016   PCP:  Rae Bugler, MD Pharmacy:   [facility] [id number] - [address], [place] - [address] [place]  [address] Phone: [phone] Fax: [phone]  [facility] [place], [place] - [place] [address] [place] [address] Phone: [phone] Fax: [phone]     Social Drivers of Health (SDOH) Social History: SDOH Screenings   Food Insecurity: No Food Insecurity (04/06/2024)  Housing: Low Risk  (04/06/2024)  Transportation Needs: No Transportation Needs (04/06/2024)  Utilities: Not At Risk (04/06/2024)  Depression (PHQ2-9): Low Risk  (07/10/2020)  Social Connections: Socially Isolated (04/06/2024)  Tobacco Use: Medium Risk (04/05/2024)   SDOH Interventions:     Readmission Risk Interventions     No data to display

## 2024-04-06 NOTE — Progress Notes (Signed)
 RE:  Joel Webb       Date of Birth:  2046-08-29     Date:   04/06/24       To Whom It May Concern:  Please be advised that the above-named patient will require a short-term nursing home stay - anticipated 30 days or less for rehabilitation and strengthening.  The plan is for return home.                 MD signature                Date

## 2024-04-06 NOTE — Evaluation (Addendum)
 Physical Therapy Evaluation Patient Details Name: Joel Webb MRN: [id number] DOB: [birth year] Today's Date: 04/06/2024  History of Present Illness  Pt is a 78 y.o. male presenting to [facility] ED on 04/05/24 from ALF with frequent falls and choking episodes. PMH is significant for HTN, HLD, CKD, T2 DM, GERD, and progressive supranuclear palsy.  Clinical Impression  Pt presents to PT with deficits in functional mobility, gait, balance, cognition, power, safety awareness. Pt has a history of multiple falls, reporting losing his balance in multiple directions. Pt reports increased difficulty with sit to stand transfers and has had multiple instances of poor control when sitting resulting in further near falls. Pt tends to lean anteriorly with trunk flexed and walker displaced multiple feet forward of base of support, PT provides multiple cues in an effort to correct this. PT also provides further cues for transfer efficiency however the pt demonstrates a persistent posterior lean and bracing of BLE against the bed. Pt remains at a high risk for further falls at this time. Patient will benefit from continued inpatient follow up therapy, <3 hours/day.        If plan is discharge home, recommend the following: A little help with walking and/or transfers;A little help with bathing/dressing/bathroom;Assistance with cooking/housework;Direct supervision/assist for medications management;Direct supervision/assist for financial management;Assist for transportation;Help with stairs or ramp for entrance;Supervision due to cognitive status   Can travel by private vehicle   Yes    Equipment Recommendations None recommended by PT  Recommendations for Other Services       Functional Status Assessment Patient has had a recent decline in their functional status and demonstrates the ability to make significant improvements in function in a reasonable and predictable amount of time.     Precautions / Restrictions  Precautions Precautions: Fall Recall of Precautions/Restrictions: Intact Precaution/Restrictions Comments: PSP Restrictions Weight Bearing Restrictions Per Provider Order: No      Mobility  Bed Mobility Overal bed mobility: Needs Assistance Bed Mobility: Supine to Sit, Sit to Supine     Supine to sit: Supervision Sit to supine: Supervision        Transfers Overall transfer level: Needs assistance Equipment used: Rolling walker (2 wheels) Transfers: Sit to/from Stand Sit to Stand: Min assist, Contact guard assist           General transfer comment: pt often with a posterior lean, requires cueing for hand placement and for anterior weight shift. pt tends to lean posteriorly and brace against bed. Pt performs ~10 sit to stand reps    Ambulation/Gait Ambulation/Gait assistance: Min assist Gait Distance (Feet): 120 Feet Assistive device: Rolling walker (2 wheels) Gait Pattern/deviations: Step-through pattern, Trunk flexed Gait velocity: reduced Gait velocity interpretation: <1.8 ft/sec, indicate of risk for recurrent falls   General Gait Details: pt with step-through gait, often requiring cues to maintain RW closer to BOS due to increased trunk flexion.  Stairs            Wheelchair Mobility     Tilt Bed    Modified Rankin (Stroke Patients Only)       Balance Overall balance assessment: Needs assistance Sitting-balance support: No upper extremity supported, Feet supported Sitting balance-Leahy Scale: Good     Standing balance support: Bilateral upper extremity supported, Reliant on assistive device for balance Standing balance-Leahy Scale: Poor                               Pertinent Vitals/Pain  Pain Assessment Pain Assessment: Faces Faces Pain Scale: No hurt    Home Living Family/patient expects to be discharged to:: Assisted living                 Home Equipment: Rolling Walker (2 wheels);Lift chair;Transport  chair Additional Comments: pt has been residing at [facility]    Prior Function Prior Level of Function : Needs assist             Mobility Comments: pt ambulates with RW, has had a history of multiple falls in various directions ADLs Comments: assistance for management of meds, meal prep and bathing     Extremity/Trunk Assessment   Upper Extremity Assessment Upper Extremity Assessment: Generalized weakness (pt with chronic L weakness from CVA, at least 4-/5 based on observation)    Lower Extremity Assessment Lower Extremity Assessment: Generalized weakness (pt with chronic L weakness from CVA, at least 4-/5 based on observation)    Cervical / Trunk Assessment Cervical / Trunk Assessment: Normal  Communication   Communication Communication: Impaired Factors Affecting Communication:  (delayed response time)    Cognition Arousal: Alert Behavior During Therapy: Flat affect   PT - Cognitive impairments: History of cognitive impairments                       PT - Cognition Comments: history of cognitive deficits due to PSP, pt with delayed processing and response time, demonstrates fair awareness of balance and safety deficits when directly asked however the pt appears to have reduced awareness during functional mobility based on reports of pt's spouse Following commands: Intact       Cueing Cueing Techniques: Verbal cues, Visual cues     General Comments General comments (skin integrity, edema, etc.): VSS on RA, pt with difficulty with downward gaze chronically due to PSP    Exercises     Assessment/Plan    PT Assessment Patient needs continued PT services  PT Problem List Decreased strength;Decreased activity tolerance;Decreased balance;Decreased mobility;Decreased cognition;Decreased knowledge of use of DME;Decreased knowledge of precautions;Decreased safety awareness       PT Treatment Interventions DME instruction;Gait training;Functional mobility  training;Therapeutic activities;Therapeutic exercise;Neuromuscular re-education;Balance training;Patient/family education;Wheelchair mobility training;Cognitive remediation    PT Goals (Current goals can be found in the Care Plan section)  Acute Rehab PT Goals Patient Stated Goal: to reduce falls risk, improve balance PT Goal Formulation: With patient/family Time For Goal Achievement: 04/20/24 Potential to Achieve Goals: Fair    Frequency Min 2X/week     Co-evaluation               AM-PAC PT "6 Clicks" Mobility  Outcome Measure Help needed turning from your back to your side while in a flat bed without using bedrails?: A Little Help needed moving from lying on your back to sitting on the side of a flat bed without using bedrails?: A Little Help needed moving to and from a bed to a chair (including a wheelchair)?: A Little Help needed standing up from a chair using your arms (e.g., wheelchair or bedside chair)?: A Little Help needed to walk in hospital room?: A Little Help needed climbing 3-5 steps with a railing? : A Lot 6 Click Score: 17    End of Session Equipment Utilized During Treatment: Gait belt Activity Tolerance: Patient tolerated treatment well Patient left: in bed;with call bell/phone within reach;with bed alarm set Nurse Communication: Mobility status PT Visit Diagnosis: Other abnormalities of gait and mobility (R26.89);Other symptoms and  signs involving the nervous system (R29.898)    Time: 1610-9604 PT Time Calculation (min) (ACUTE ONLY): 34 min   Charges:   PT Evaluation $PT Eval Low Complexity: 1 Low PT Treatments $Therapeutic Activity: 8-22 mins PT General Charges $$ ACUTE PT VISIT: 1 Visit         Rexie Catena, PT, DPT Acute Rehabilitation Office [phone]   Rexie Catena 04/06/2024, 3:32 PM

## 2024-04-06 NOTE — Hospital Course (Signed)
 78 y.o. male with medical history significant for essential hypertension, hyperlipidemia, CKD 3B, type 2 diabetes, GERD, progressive supranuclear palsy affecting walking, balance and movement swallowing and cognitive deficits, who presents to the ER via EMS from ALF with frequent falls and choking episodes, which have led him to not eat as well.  No reported subjective fevers or chills.   In the ER, weak appearing, reportedly, the patient has been falling multiple times in the last week and at least 2 times today.  At baseline ambulates with a walker.  Has also been having more episodes of dysphagia.  No reported fevers or chills.  Endorses worsening left-sided weakness for about a month.   Noncontrast head CT and cervical spine did not show any acute intracranial pathology.  It revealed small vessel white matter disease and old lacunar infarction of the right lentiform nuclei.  No fracture or static subluxation of the cervical spine.  Focally moderate disc space height loss of C6-C7 with otherwise intact disc spaces.   Chest x-ray revealed borderline to mild cardiomegaly with small pleural effusions and vascular congestion.  Patchy atelectasis or infiltrates at the left base.  BNP was elevated greater than 800.  With associated lower extremity edema, concern for possible acute CHF.  The patient is on p.o. Lasix  20 mg weekly, on Mondays.   Due to concern for unsafe discharge from the ER, EDP requested admission for further management of dysphagia, falls, and acute CHF.   Admitted by [facility], hospitalist service.

## 2024-04-06 NOTE — Progress Notes (Signed)
 Progress Note   Patient: Joel Webb ZOX:[id number] DOB: September 24, 1946 DOA: 04/05/2024     0 DOS: the patient was seen and examined on 04/06/2024   Brief hospital course:  78 y.o. male with medical history significant for essential hypertension, hyperlipidemia, CKD 3B, type 2 diabetes, GERD, progressive supranuclear palsy affecting walking, balance and movement swallowing and cognitive deficits, who presents to the ER via EMS from ALF with frequent falls and choking episodes, which have led him to not eat as well.  No reported subjective fevers or chills.   In the ER, weak appearing, reportedly, the patient has been falling multiple times in the last week and at least 2 times today.  At baseline ambulates with a walker.  Has also been having more episodes of dysphagia.  No reported fevers or chills.  Endorses worsening left-sided weakness for about a month.   Noncontrast head CT and cervical spine did not show any acute intracranial pathology.  It revealed small vessel white matter disease and old lacunar infarction of the right lentiform nuclei.  No fracture or static subluxation of the cervical spine.  Focally moderate disc space height loss of C6-C7 with otherwise intact disc spaces.   Chest x-ray revealed borderline to mild cardiomegaly with small pleural effusions and vascular congestion.  Patchy atelectasis or infiltrates at the left base.  BNP was elevated greater than 800.  With associated lower extremity edema, concern for possible acute CHF.  The patient is on p.o. Lasix  20 mg weekly, on Mondays.   Due to concern for unsafe discharge from the ER, EDP requested admission for further management of dysphagia, falls, and acute CHF.   Admitted by [facility], hospitalist service.  Assessment and Plan: Frequent falls in the setting of progressive supranuclear palsy Follows with neurology as outpt PT/OT consulted. Recs for SNF noted   Acute CHF, unspecified No history of 2D echo in the  past Presented with BNP elevated greater than 800, pulmonary edema, cardiomegaly on chest x-ray, bilateral lower extremity edema. 2d echo reviewed with EF 35-40%. Unable to eval for WMA Pt was recently established with Cardiology as outpt. Will ask Cardiology to see here   Oropharyngeal dysphagia Speech therapy evaluation Pt now s/p MBS and cleared for dysphagia 3 per SLP   Hypertension Cont home Norvasc Overall stable   Hyperlipidemia Resume home Lipitor and Zetia    Type 2 diabetes with hyperglycemia Hemoglobin A1c 7.6 on 01/08/2022 Cont SSI as needed     Subjective: Reported LE swelling, mostly around the ankles  Physical Exam: Vitals:   04/06/24 0500 04/06/24 0608 04/06/24 0738 04/06/24 1500  BP: (!) 141/103 (!) (P) 146/90 (!) 107/59 (!) 141/93  Pulse: 99 (P) 92 96 93  Resp: (!) 22 (P) 20 15 18   Temp:   (!) 97.5 F (36.4 C) 97.6 F (36.4 C)  TempSrc:    Oral  SpO2: 96% (P) 95% 93% 98%   General exam: Awake, laying in bed, in nad Respiratory system: Normal respiratory effort, no wheezing Cardiovascular system: regular rate, s1, s2 Gastrointestinal system: Soft, nondistended, positive BS Central nervous system: CN2-12 grossly intact, strength intact Extremities: Perfused, no clubbing Skin: Normal skin turgor, no notable skin lesions seen Psychiatry: Mood normal // no visual hallucinations   Data Reviewed:  Labs reviewed: Na 137, K 3.7, Cr 1.32, WBC 8.8, hgb 14.6, Plts 143  Family Communication: Pt in room, family at bedside  Disposition: Status is: Observation The patient remains OBS appropriate and will d/c before 2 midnights.  Planned Discharge Destination: Skilled nursing facility     Author: Cherylle Corwin, MD 04/06/2024 7:14 PM  For on call review [email or web address].

## 2024-04-06 NOTE — Evaluation (Signed)
 Clinical/Bedside Swallow Evaluation Patient Details  Name: Joel Webb MRN: [id number] Date of Birth: 05-10-1946  Today's Date: 04/06/2024 Time: SLP Start Time (ACUTE ONLY): 0932 SLP Stop Time (ACUTE ONLY): 0949 SLP Time Calculation (min) (ACUTE ONLY): 17 min  Past Medical History:  Past Medical History:  Diagnosis Date   Chronic kidney disease, stage 3 01/11/2021   ED (erectile dysfunction)    Essential hypertension 06/05/2017   Frequent falls    Gait abnormality    Gastroesophageal reflux disease 01/11/2021   Major depressive disorder    Microalbuminuria due to type 2 diabetes mellitus 05/25/2020   Mild cognitive impairment of uncertain or unknown etiology    Mixed hyperlipidemia 02/28/2016   Multiple lacunar infarcts    Plantar fasciitis    Prostatitis    Pure hypercholesterolemia 01/11/2021   PVC's (premature ventricular contractions) 01/28/2022   Uncontrolled type 2 diabetes mellitus with hyperglycemia, with long-term current use of insulin  02/28/2016   Past Surgical History:  Past Surgical History:  Procedure Laterality Date   CIRCUMCISION     COLONOSCOPY     MOUTH SURGERY  01/2020   HPI:  Pt is a 78 yo male presenting from ALF with frequent falls and choking episodes. MBS in 2023 revealed minimal oropharyngeal dysphagia with aspiration of thin liquids on sequential sips (PAS 7, sensed but not cleared by cough). PMH includes: PSP, GERD, MCI, HTN, HLD, CKD, DMII    Assessment / Plan / Recommendation  Clinical Impression  Pt has a grossly functional oral motor exam (very subtle reduced ROM on L side of face) without overt oral deficits during mastication or oral clearance. He has frequent, strong coughing that is associated with both thin and nectar thick liquids. Pt currently on modified diet of purees and nectar thick liquids per MD, but pt/wife report that he has not been on a modified diet PTA. He has had coughing with thin liquids for several months now, but  denies any h/o PNA. It sounds like he has a good, consistent oral care regimen. Pt and wife are interested in pursuing MBS to better evaluate oropharyngeal swallow with increased signs of dysphagia since lase MBS in 2023. Pending completion of the study, would offer mechanical soft solids and thin water only, keeping up with oral care.   SLP Visit Diagnosis: Dysphagia, unspecified (R13.10)    Aspiration Risk       Diet Recommendation Dysphagia 3 (Mech soft);Free water protocol after oral care    Medication Administration: Whole meds with puree Supervision: Patient able to self feed;Full supervision/cueing for compensatory strategies Compensations: Slow rate;Small sips/bites Postural Changes: Seated upright at 90 degrees;Remain upright for at least 30 minutes after po intake    Other  Recommendations Oral Care Recommendations: Oral care QID    Recommendations for follow up therapy are one component of a multi-disciplinary discharge planning process, led by the attending physician.  Recommendations may be updated based on patient status, additional functional criteria and insurance authorization.  Follow up Recommendations  (TBA)      Assistance Recommended at Discharge    Functional Status Assessment    Frequency and Duration            Prognosis Prognosis for improved oropharyngeal function: Good      Swallow Study   General HPI: Pt is a 78 yo male presenting from ALF with frequent falls and choking episodes. MBS in 2023 revealed minimal oropharyngeal dysphagia with aspiration of thin liquids on sequential sips (PAS 7, sensed but not  cleared by cough). PMH includes: PSP, GERD, MCI, HTN, HLD, CKD, DMII Type of Study: Bedside Swallow Evaluation Previous Swallow Assessment: see HPI Diet Prior to this Study: Dysphagia 1 (pureed);Mildly thick liquids (Level 2, nectar thick) Temperature Spikes Noted: No Respiratory Status: Room air History of Recent Intubation:  No Behavior/Cognition: Alert;Cooperative;Pleasant mood Oral Cavity Assessment: Within Functional Limits Oral Care Completed by SLP: No Oral Cavity - Dentition: Adequate natural dentition;Other (Comment) (partial) Vision: Functional for self-feeding Self-Feeding Abilities: Able to feed self Patient Positioning: Upright in bed Baseline Vocal Quality: Normal;Other (comment) (speech dysarthric) Volitional Cough: Strong Volitional Swallow: Able to elicit    Oral/Motor/Sensory Function Overall Oral Motor/Sensory Function: Within functional limits   Ice Chips Ice chips: Not tested   Thin Liquid Thin Liquid: Impaired Presentation: Self Fed;Cup Pharyngeal  Phase Impairments: Multiple swallows;Cough - Immediate;Cough - Delayed    Nectar Thick Nectar Thick Liquid: Impaired Presentation: Cup;Self Fed Pharyngeal Phase Impairments: Cough - Immediate   Honey Thick Honey Thick Liquid: Not tested   Puree Puree: Within functional limits Presentation: Self Fed;Spoon   Solid     Solid: Within functional limits Presentation: Self Fed      [name]., M.A. CCC-SLP Acute Rehabilitation Services Office: [phone]  Secure chat preferred  04/06/2024,10:04 AM

## 2024-04-06 NOTE — Evaluation (Signed)
 Modified Barium Swallow Study  Patient Details  Name: Joel Webb MRN: [id number] Date of Birth: 06-25-1946  Today's Date: 04/06/2024  Modified Barium Swallow completed.  Full report located under Chart Review in the Imaging Section.  History of Present Illness Pt is a 78 yo male presenting from ALF with frequent falls and choking episodes. MBS in 2023 revealed minimal oropharyngeal dysphagia with aspiration of thin liquids on sequential sips (PAS 7, sensed but not cleared by cough). PMH includes: PSP, GERD, MCI, HTN, HLD, CKD, DMII   Clinical Impression Pt has an oropharyngeal dysphagia with reduced coordination that contributes to aspiration with thin and nectar thick liquids. His oral phase is marked by repetitive/disorganized lingual motion, disorganized chewing with small pieces of cracker not fully masticated before swallowing, and inconsistent oral clearance. Premature spillage was noted, as was piecemeal swallowing at times, making amount of oral residue quite variable, although ultimately he cleared his mouth well. The barium tablet could not be orally transited though and was manually removed. Reduced oral coordination resulted in inconsistent site of initiation, with liquids often spilling into his airway before airway closure. Aspiration of thin and nectar thick liquids is inconsistently sensed (PAS 7 and 8), and even when he does cough, he does not clear his airway. Coughing sometimes appears to be delayed, and sometimes he coughs without overt connection to aspiration, which could be indicative of further delayed sensation.  Pharyngeally there is reduced soft palate elevation, anterior hyoid movement, laryngeal vestibule closure, epiglottic movement, and pharyngeal squeeze that contribute to both reduced safety and efficiency. A collection of residue remains primarily in the valleculae. Trace penetration, sometimes from this residue, occurs after the swallow with honey thick  liquids. Compensatory strategies tested include oral hold, chin tuck, and use of straw, but they did not significantly improve airway protection. Small, single sips (cued to "sip on hot coffee") resulted in better airway protection with thin liquids (penetration x1) but not with nectar thick liquids (PAS 7).  Discussed findings with pt and spouse, who was present for the study. We discussed a more conservative approach, including honey thick liquids (with water protocol) as this did not result in aspiration even with larger volumes and self-pacing. An alternative option would be to remain on thin liquids, as this is his baseline diet and he appears to have been tolerating it with no pulmonary issues despite signs of dysphagia for months. If on thin liquids, would consider a Provale cup to regulate bolus size until he can more consistently take small, single sips. Either option could include f/u SLP for swallowing therapy to optimize function. At this time, pt and his wife would like to remain on mechanical soft diet and thin liquids, with careful use of strategies to reduce but not eliminate aspiration risk.  Factors that may increase risk of adverse event in presence of aspiration Joel Webb & Joel Webb 2021): Reduced cognitive function;Limited mobility;Weak cough;Aspiration of thick, dense, and/or acidic materials  Swallow Evaluation Recommendations Recommendations: PO diet PO Diet Recommendation: Dysphagia 3 (Mechanical soft);Thin liquids (Level 0) Liquid Administration via: Cup;Other (Comment) (provale cup) Medication Administration: Whole meds with puree Supervision: Patient able to self-feed;Full supervision/cueing for swallowing strategies Swallowing strategies  : Minimize environmental distractions;Slow rate;Small bites/sips (one small sip at a time, like you are "sipping on hot coffee" (can use Provale cup)) Postural changes: Position pt fully upright for meals Oral care recommendations: Oral care  QID (4x/day)      [name]., M.A. CCC-SLP Acute Rehabilitation Services Office: [phone]  Secure chat preferred  04/06/2024,2:25 PM

## 2024-04-06 NOTE — Progress Notes (Signed)
 Speech Language Pathology Treatment: Dysphagia  Patient Details Name: Joel Webb MRN: [id number] DOB: 1946/09/19 Today's Date: 04/06/2024 Time: 4098-1191 SLP Time Calculation (min) (ACUTE ONLY): 17 min  Assessment / Plan / Recommendation Clinical Impression  SLP f/u after MBS to bring Provale cup (10CC) to room. SLP also provided pt with supplies to perform oral care. Cup was opened and set up for pt. He required assistance with repositioning, as well as Min cues to figure out how to use it, but he picked up on it very quickly. Note that it did not eliminate coughing. Will need to see if it starts to reduce it as he uses it more comfortable.   While in the room pt's daughter, Joel Webb, called and spoke to this SLP on speaker phone. Joel Webb is a hospital based SLP and is very familiar with neurogenic dysphagia. MBS results and recommendations were reviewed. She acknowledges her understanding and overall says she is not surprised based on clinical presentation she was seeing. She says that they would like to focus on comfort and hydration, acknowledging that he aspirates and that we should try to reduce the frequency if possible, but would like for him to stay on thin liquids (she also confirms no h/o PNA). He agrees with thin liquids via Provale cup, but would like for him to have water in between meals from a regular cup to support hydration. SLP updated recommendations accordingly and will plan to f/u with trial therapy to optimize/maintain function in light of otherwise progressive neurological dx.    HPI HPI: Pt is a 78 yo male presenting from ALF with frequent falls and choking episodes. MBS in 2023 revealed minimal oropharyngeal dysphagia with aspiration of thin liquids on sequential sips (PAS 7, sensed but not cleared by cough). PMH includes: PSP, GERD, MCI, HTN, HLD, CKD, DMII      SLP Plan  Continue with current plan of care      Recommendations for follow up therapy are one  component of a multi-disciplinary discharge planning process, led by the attending physician.  Recommendations may be updated based on patient status, additional functional criteria and insurance authorization.    Recommendations  Diet recommendations: Dysphagia 3 (mechanical soft);Thin liquid Liquids provided via: Cup;No straw (thin liquids via Provale cup; water via regular cup) Medication Administration: Whole meds with puree Supervision: Patient able to self feed;Full supervision/cueing for compensatory strategies Compensations: Slow rate;Small sips/bites Postural Changes and/or Swallow Maneuvers: Seated upright 90 degrees                  Oral care QID     Dysphagia, oropharyngeal phase (R13.12)     Continue with current plan of care     [name]., M.A. CCC-SLP Acute Rehabilitation Services Office: [phone]  Secure chat preferred   04/06/2024, 3:18 PM

## 2024-04-06 NOTE — Progress Notes (Signed)
  Echocardiogram 2D Echocardiogram has been performed.  Shivali Quackenbush L Yenni Carra RDCS 04/06/2024, 9:09 AM

## 2024-04-07 DIAGNOSIS — I5021 Acute systolic (congestive) heart failure: Secondary | ICD-10-CM

## 2024-04-07 DIAGNOSIS — I493 Ventricular premature depolarization: Secondary | ICD-10-CM | POA: Diagnosis not present

## 2024-04-07 DIAGNOSIS — R296 Repeated falls: Secondary | ICD-10-CM | POA: Diagnosis not present

## 2024-04-07 LAB — GLUCOSE, CAPILLARY
Glucose-Capillary: 153 mg/dL — ABNORMAL HIGH (ref 70–99)
Glucose-Capillary: 200 mg/dL — ABNORMAL HIGH (ref 70–99)
Glucose-Capillary: 133 mg/dL — ABNORMAL HIGH (ref 70–99)
Glucose-Capillary: 139 mg/dL — ABNORMAL HIGH (ref 70–99)
Glucose-Capillary: 117 mg/dL — ABNORMAL HIGH (ref 70–99)

## 2024-04-07 LAB — CBC
HCT: 44.2 % (ref 39.0–52.0)
Hemoglobin: 14.9 g/dL (ref 13.0–17.0)
MCH: 29 pg (ref 26.0–34.0)
MCHC: 33.7 g/dL (ref 30.0–36.0)
MCV: 86 fL (ref 80.0–100.0)
Platelets: 130 10*3/uL — ABNORMAL LOW (ref 150–400)
RBC: 5.14 MIL/uL (ref 4.22–5.81)
RDW: 14.3 % (ref 11.5–15.5)
WBC: 8 10*3/uL (ref 4.0–10.5)
nRBC: 0 % (ref 0.0–0.2)

## 2024-04-07 LAB — COMPREHENSIVE METABOLIC PANEL WITH GFR
ALT: 11 U/L (ref 0–44)
AST: 14 U/L — ABNORMAL LOW (ref 15–41)
Albumin: 3.1 g/dL — ABNORMAL LOW (ref 3.5–5.0)
Alkaline Phosphatase: 96 U/L (ref 38–126)
Anion gap: 9 (ref 5–15)
BUN: 20 mg/dL (ref 8–23)
CO2: 20 mmol/L — ABNORMAL LOW (ref 22–32)
Calcium: 8.7 mg/dL — ABNORMAL LOW (ref 8.9–10.3)
Chloride: 109 mmol/L (ref 98–111)
Creatinine, Ser: 1.25 mg/dL — ABNORMAL HIGH (ref 0.61–1.24)
GFR, Estimated: 59 mL/min — ABNORMAL LOW (ref >60)
Glucose, Bld: 165 mg/dL — ABNORMAL HIGH (ref 70–99)
Potassium: 3.8 mmol/L (ref 3.5–5.1)
Sodium: 138 mmol/L (ref 135–145)
Total Bilirubin: 1.2 mg/dL (ref 0.0–1.2)
Total Protein: 6 g/dL — ABNORMAL LOW (ref 6.5–8.1)

## 2024-04-07 MED ORDER — METOPROLOL SUCCINATE ER 25 MG PO TB24
25.0000 mg | ORAL_TABLET | Freq: Every day | ORAL | Status: DC
  Administered 2024-04-08 – 2024-04-10 (×3): 25 mg via ORAL
  Filled 2024-04-07 (×3): qty 1

## 2024-04-07 MED ORDER — SPIRONOLACTONE 12.5 MG HALF TABLET
12.5000 mg | ORAL_TABLET | Freq: Every day | ORAL | Status: DC
  Administered 2024-04-07 – 2024-04-10 (×4): 12.5 mg via ORAL
  Filled 2024-04-07 (×4): qty 1

## 2024-04-07 MED ORDER — LOSARTAN POTASSIUM 50 MG PO TABS
25.0000 mg | ORAL_TABLET | Freq: Every day | ORAL | Status: DC
  Administered 2024-04-07 – 2024-04-10 (×4): 25 mg via ORAL
  Filled 2024-04-07 (×4): qty 1

## 2024-04-07 MED ORDER — METOPROLOL TARTRATE 12.5 MG HALF TABLET
12.5000 mg | ORAL_TABLET | Freq: Two times a day (BID) | ORAL | Status: DC
  Administered 2024-04-07: 12.5 mg via ORAL
  Filled 2024-04-07: qty 1

## 2024-04-07 NOTE — NC FL2 (Signed)
 [place]  MEDICAID FL2 LEVEL OF CARE FORM     IDENTIFICATION  Patient Name: Joel Webb Birthdate: 1946-01-28 Sex: male Admission Date (Current Location): 04/05/2024  [facility] and [place] Number:  Producer, television/film/video and Address:  [facility], [address] [place], [place] [address]      Provider Number: [id number]  Attending Physician Name and Address:  [place], Eric J, DO  Relative Name and Phone Number:  CESAREO, VICKREY [phone]    Current Level of Care: Hospital Recommended Level of Care: Skilled Nursing Facility Prior Approval Number:    Date Approved/Denied:   PASRR Number:    Discharge Plan: SNF    Current Diagnoses: Patient Active Problem List   Diagnosis Date Noted   Multiple lacunar infarcts    Frequent falls    Gait abnormality    Major depressive disorder 05/30/2022   Mild cognitive impairment of uncertain or unknown etiology    PVC's (premature ventricular contractions) 01/28/2022   Pure hypercholesterolemia 01/11/2021   Gastroesophageal reflux disease 01/11/2021   Chronic kidney disease, stage 3 01/11/2021   Microalbuminuria due to type 2 diabetes mellitus 05/25/2020   Essential hypertension 06/05/2017   Uncontrolled type 2 diabetes mellitus with hyperglycemia, with long-term current use of insulin  (HCC) 02/28/2016   Mixed hyperlipidemia 02/28/2016    Orientation RESPIRATION BLADDER Height & Weight     Self, Time, Situation, Place  Normal Incontinent Weight: 171 lb 15.3 oz (78 kg) Height:     BEHAVIORAL SYMPTOMS/MOOD NEUROLOGICAL BOWEL NUTRITION STATUS      Continent Diet (see discharge summary)  AMBULATORY STATUS COMMUNICATION OF NEEDS Skin   Limited Assist Verbally Skin abrasions, Other (Comment) (several skin tears)                       Personal Care Assistance Level of Assistance  Bathing, Feeding, Dressing Bathing Assistance: Limited assistance Feeding assistance: Limited  assistance Dressing Assistance: Limited assistance     Functional Limitations Info  Sight, Hearing, Speech Sight Info: Adequate Hearing Info: Impaired Speech Info: Adequate    SPECIAL CARE FACTORS FREQUENCY  PT (By licensed PT), OT (By licensed OT)     PT Frequency: 5x week OT Frequency: 5x week            Contractures Contractures Info: Not present    Additional Factors Info  Code Status, Allergies, Insulin  Sliding Scale Code Status Info: full Allergies Info: NKA   Insulin  Sliding Scale Info: Novolog : see discharge summary       Current Medications (04/07/2024):  This is the current hospital active medication list Current Facility-Administered Medications  Medication Dose Route Frequency Provider Last Rate Last Admin   acetaminophen  (TYLENOL ) tablet 650 mg  650 mg Oral Q6H PRN Reesa Cannon N, DO       amLODipine (NORVASC) tablet 5 mg  5 mg Oral Daily [place], Carole N, DO   5 mg at 04/07/24 0819   atorvastatin (LIPITOR) tablet 80 mg  80 mg Oral Daily Hall, Carole N, DO   80 mg at 04/07/24 5621   enoxaparin  (LOVENOX ) injection 40 mg  40 mg Subcutaneous Daily Reesa Cannon N, DO   40 mg at 04/07/24 0818   ezetimibe  (ZETIA ) tablet 10 mg  10 mg Oral Daily Reesa Cannon N, DO   10 mg at 04/07/24 3086   insulin  aspart (novoLOG ) injection 0-5 Units  0-5 Units Subcutaneous QHS Reesa Cannon N, DO       insulin   aspart (novoLOG ) injection 0-9 Units  0-9 Units Subcutaneous TID WC Reesa Cannon N, DO   2 Units at 04/07/24 0818   melatonin tablet 5 mg  5 mg Oral QHS PRN Hall, Carole N, DO   5 mg at 04/06/24 2119   polyethylene glycol (MIRALAX  / GLYCOLAX ) packet 17 g  17 g Oral Daily PRN Hall, Carole N, DO       prochlorperazine  (COMPAZINE ) injection 5 mg  5 mg Intravenous Q6H PRN Hall, Carole N, DO         Discharge Medications: Please see discharge summary for a list of discharge medications.  Relevant Imaging Results:  Relevant Lab Results:   Additional Information SSN:  [id number]  Elspeth Hals, LCSW

## 2024-04-07 NOTE — Progress Notes (Signed)
 Heart Failure Navigator Progress Note  Assessed for Heart & Vascular TOC clinic readiness.  Patient with new onset CHF, EF 35-40%. He would like to defer repeat ECHO and titration of GDMT outpatient. Conservative management given history of falls. [facility] appt scheduled for 6/3.   Navigator available for reassessment of patient.   Jerilyn Monte, PharmD, BCPS Heart Failure Stewardship Pharmacist Phone [phone]

## 2024-04-07 NOTE — Progress Notes (Signed)
 PROGRESS NOTE    Joel Webb  NFA:[id number] DOB: 1946/01/25 DOA: 04/05/2024 PCP: Rae Bugler, MD    Brief Narrative:   Joel Webb is a 78 y.o. male with past medical history significant for HTN, HLD, CKD stage IIIb, DM2, progressive supranuclear palsy affecting ambulation/balance/swallowing with cognitive defects, GERD who presented to [facility] ED from [facility] with increased falls and choking episodes leading to poor oral intake.  No reported fever/chills.  Apparently at ALF, patient has been falling at least 2 times daily, at baseline utilizes a walker for ambulation.  Additionally having more episodes of dysphagia with worsening left-sided weakness for roughly 1 month.  In the ED, temperature 97.9 F, HR 83, RR 18, BP 137/82, SpO2 95% on room air.  WBC 8.4, hemoglobin 14.4, platelet count 143.  Sodium 138, potassium 4.0, chloride 107, CO2 22, glucose 176, BUN 25, creatinine 1.46.  AST 16, ALT 12, total Ruben 0.9.  BNP 878.5.  A1c 5.5.  Urinalysis with small hemoglobin, negative nitrite/leukocytes, no bacteria, 0-5 WBCs.  CT head/C-spine without contrast with no acute intracranial pathology, noted small vessel white matter disease and old lacunar infarction on the right limited informed nuclear, no fracture/subluxation of the C-spine, moderate disc space height loss at C6/7.  Chest x-ray with borderline/mild cardiomegaly with small pleural effusions and vascular congestion.  Due to unsafe discharge from the ED, EDP requested admission for further evaluation and management of dysphagia, falls and acute CHF.  TRH consulted for admission.  Assessment & Plan:   Frequent falls in the setting of progressive supranuclear palsy Follows with neurology outpatient. -- PT/OT recommend SNF; TOC following -- Fall precautions, continue therapy while inpatient   Acute systolic congestive heart failure  Presented with BNP elevated greater than 800, pulmonary edema, cardiomegaly on chest  x-ray, bilateral lower extremity edema.  No previous TTE available for review.  TTE with LVEF 35 to 40%, unable to evaluate LV regional wall motion, no aortic stenosis, IVC normal in size. -- Cardiology consulted -- Start metoprolol tartrate 12.5 m p.o. twice daily -- Start losartan 25 mg p.o. daily -- Start spironolactone 12.5 mg p.o. daily -- Strict I's and O's and daily weights -- BMP/BNP in the a.m. -- Awaiting further recommendations from cardiology   Oropharyngeal dysphagia Patient seen by speech therapy, underwent MBS and cleared for dysphagia 3 diet. Dysphagia 3 (mechanical soft);Thin liquid Liquids provided via: Cup;No straw (thin liquids via Provale cup; water via regular cup) Medication Administration: Whole meds with puree Supervision: Patient able to self feed;Full supervision/cueing for compensatory strategies Compensations: Slow rate;Small sips/bites Postural Changes and/or Swallow Maneuvers: Seated upright 90 degrees Spleen  -- Aspiration precautions   Hypertension Will discontinue home amlodipine. -- Start metoprolol tartrate 12.5 m p.o. twice daily -- Start losartan 25 mg p.o. daily -- Start spironolactone 12.5 mg p.o. daily   Hyperlipidemia -- Atorvastatin 80 mg p.o. daily -- Zetia  10 g p.o. daily   Type 2 diabetes with hyperglycemia Hemoglobin A1c 5.5.  On Mounjaro and Novolin sliding scale outpatient. -- SSI for coverage -- CBG before every meal/at bedtime   DVT prophylaxis: enoxaparin  (LOVENOX ) injection 40 mg Start: 04/06/24 1000    Code Status: Full Code Family Communication: No family present at bedside this morning  Disposition Plan:  Level of care: Telemetry Medical Status is: Observation The patient remains OBS appropriate and will d/c before 2 midnights.    Consultants:  Cardiology: Pending  Procedures:  TTE  Antimicrobials:  none   Subjective: Patient  seen examined bedside, currently walking around the unit with a mobility tech.   No complaints this morning.  Awaiting cardiology evaluation.  No family present, updated spouse via telephone this afternoon.  Patient denies headache, no chest pain, no palpitations, no shortness of breath, no abdominal pain, no fever/chills, no nausea/vomiting/diarrhea, no paresthesias.  No acute events overnight per nursing staff.  Objective: Vitals:   04/06/24 2128 04/07/24 0425 04/07/24 0433 04/07/24 0809  BP: (!) 140/92  (!) 144/85 (!) 142/102  Pulse: (!) 105  91 96  Resp: 17 18 17 20   Temp: 98.2 F (36.8 C)  98.7 F (37.1 C) 97.6 F (36.4 C)  TempSrc:   Oral   SpO2: 92%  (!) 88% 93%  Weight:  78 kg      Intake/Output Summary (Last 24 hours) at 04/07/2024 1341 Last data filed at 04/07/2024 1236 Gross per 24 hour  Intake 540 ml  Output 2 ml  Net 538 ml   Filed Weights   04/07/24 0425  Weight: 78 kg    Examination:  Physical Exam: GEN: NAD, alert and oriented x 3, elderly appearance HEENT: NCAT, PERRL, EOMI, sclera clear, MMM PULM: CTAB w/o wheezes/crackles, normal respiratory effort, on room air CV: RRR w/o M/G/R GI: abd soft, NTND, + BS MSK: no peripheral edema, moves all extremities independently NEURO: No focal no PSYCH: normal mood/affect    Data Reviewed: I have personally reviewed following labs and imaging studies  CBC: Recent Labs  Lab 04/05/24 2229 04/06/24 0433 04/07/24 0651  WBC 8.4 8.8 8.0  NEUTROABS 5.7  --   --   HGB 14.4 14.6 14.9  HCT 42.6 44.6 44.2  MCV 87.1 87.5 86.0  PLT 143* 143* 130*   Basic Metabolic Panel: Recent Labs  Lab 04/05/24 2229 04/06/24 0433 04/07/24 0651  NA 138 137 138  K 4.0 3.7 3.8  CL 107 109 109  CO2 22 22 20*  GLUCOSE 176* 147* 165*  BUN 25* 22 20  CREATININE 1.46* 1.32* 1.25*  CALCIUM 9.3 8.8* 8.7*  MG  --  2.0  --   PHOS  --  3.5  --    GFR: Estimated Creatinine Clearance: 49.5 mL/min (A) (by C-G formula based on SCr of 1.25 mg/dL (H)). Liver Function Tests: Recent Labs  Lab 04/05/24 2229  04/07/24 0651  AST 16 14*  ALT 12 11  ALKPHOS 98 96  BILITOT 0.7 1.2  PROT 6.5 6.0*  ALBUMIN 3.4* 3.1*   No results for input(s): "LIPASE", "AMYLASE" in the last 168 hours. No results for input(s): "AMMONIA" in the last 168 hours. Coagulation Profile: No results for input(s): "INR", "PROTIME" in the last 168 hours. Cardiac Enzymes: No results for input(s): "CKTOTAL", "CKMB", "CKMBINDEX", "TROPONINI" in the last 168 hours. BNP (last 3 results) No results for input(s): "PROBNP" in the last 8760 hours. HbA1C: Recent Labs    04/05/24 2229  HGBA1C 5.5   CBG: Recent Labs  Lab 04/06/24 1605 04/06/24 2124 04/07/24 0634 04/07/24 0809 04/07/24 1141  GLUCAP 119* 161* 153* 200* 133*   Lipid Profile: No results for input(s): "CHOL", "HDL", "LDLCALC", "TRIG", "CHOLHDL", "LDLDIRECT" in the last 72 hours. Thyroid  Function Tests: No results for input(s): "TSH", "T4TOTAL", "FREET4", "T3FREE", "THYROIDAB" in the last 72 hours. Anemia Panel: No results for input(s): "VITAMINB12", "FOLATE", "FERRITIN", "TIBC", "IRON", "RETICCTPCT" in the last 72 hours. Sepsis Labs: No results for input(s): "PROCALCITON", "LATICACIDVEN" in the last 168 hours.  Recent Results (from the past 240 hours)  MRSA Next  Gen by PCR, Nasal     Status: None   Collection Time: 04/06/24  6:46 AM   Specimen: Nasal Mucosa; Nasal Swab  Result Value Ref Range Status   MRSA by PCR Next Gen NOT DETECTED NOT DETECTED Final    Comment: (NOTE) The GeneXpert MRSA Assay (FDA approved for NASAL specimens only), is one component of a comprehensive MRSA colonization surveillance program. It is not intended to diagnose MRSA infection nor to guide or monitor treatment for MRSA infections. Test performance is not FDA approved in patients less than 3 years old. Performed at [facility], [address] [place], [place] [address]          Radiology Studies: MR BRAIN WO CONTRAST Result Date: 04/06/2024 CLINICAL  DATA:  Initial evaluation for mental status change, unknown cause. EXAM: MRI HEAD WITHOUT CONTRAST TECHNIQUE: Multiplanar, multiecho pulse sequences of the brain and surrounding structures were obtained without intravenous contrast. COMPARISON:  CT from 04/05/2024 FINDINGS: Brain: Mild age-related cerebral atrophy. Patchy T2/FLAIR hyperintensity involving the supratentorial cerebral white matter and pons, consistent with chronic small vessel ischemic disease, mild in nature. Remote lacunar infarct present at the right basal ganglia. No evidence for acute or subacute infarct. No acute or chronic intracranial blood products. No mass lesion, midline shift or mass effect. No hydrocephalus or extra-axial fluid collection. Pituitary gland within normal limits. Vascular: Major intracranial vascular flow voids are maintained. Skull and upper cervical spine: Craniocervical junction with normal limits. Bone marrow signal intensity normal. No scalp soft tissue abnormality. Sinuses/Orbits: Globes orbital soft tissues within normal limits. Small right maxillary sinus retention cyst noted. Paranasal sinuses are otherwise clear. No mastoid effusion. Other: None. IMPRESSION: 1. No acute intracranial abnormality. 2. Mild age-related cerebral atrophy with chronic small vessel ischemic disease, with remote lacunar infarct at the right basal ganglia. Electronically Signed   By: Virgia Griffins M.D.   On: 04/06/2024 18:23   ECHOCARDIOGRAM COMPLETE Result Date: 04/06/2024    ECHOCARDIOGRAM REPORT   Patient Name:   Joel Webb Date of Exam: 04/06/2024 Medical Rec #:  [id number]        Height:       69.0 in Accession #:    [id number]       Weight:       182.0 lb Date of Birth:  06-05-1946       BSA:          1.985 m Patient Age:    77 years         BP:           107/59 mmHg Patient Gender: M                HR:           87 bpm. Exam Location:  Inpatient Procedure: 2D Echo, 3D Echo, Color Doppler and Cardiac Doppler (Both  Spectral            and Color Flow Doppler were utilized during procedure). Indications:    CHF-acute diastolic  History:        Patient has no prior history of Echocardiogram examinations.                 Arrythmias:PVC; Risk Factors:Diabetes, Dyslipidemia and                 Hypertension. CKD stage 3.  Sonographer:    Juanita Shaw Referring Phys: [id number] Charlotta Cook HALL  Sonographer Comments: Image acquisition challenging due to respiratory  motion. IMPRESSIONS  1. Left ventricular ejection fraction, by estimation, is 35 to 40%. The left ventricle has moderately decreased function. Left ventricular endocardial border not optimally defined to evaluate regional wall motion. The left ventricular internal cavity size was mildly dilated. Left ventricular diastolic parameters are indeterminate.  2. Right ventricular systolic function is normal. The right ventricular size is normal. Tricuspid regurgitation signal is inadequate for assessing PA pressure.  3. The mitral valve is normal in structure. Trivial mitral valve regurgitation. No evidence of mitral stenosis.  4. The aortic valve is tricuspid. Aortic valve regurgitation is trivial. No aortic stenosis is present.  5. The inferior vena cava is normal in size with greater than 50% respiratory variability, suggesting right atrial pressure of 3 mmHg. Comparison(s): Patient did not have IV access for definity microbubble contrast. Images are technically difficult and estimation of left ventricular systolic function is of low confidence. Conclusion(s)/Recommendation(s): The study should be repeated with Definity contrast or an alternative imaging modality should be used. FINDINGS  Left Ventricle: Left ventricular ejection fraction, by estimation, is 35 to 40%. The left ventricle has moderately decreased function. Left ventricular endocardial border not optimally defined to evaluate regional wall motion. The left ventricular internal cavity size was mildly dilated. There is no  left ventricular hypertrophy. Left ventricular diastolic function could not be evaluated due to nondiagnostic images. Left ventricular diastolic parameters are indeterminate. Right Ventricle: The right ventricular size is normal. No increase in right ventricular wall thickness. Right ventricular systolic function is normal. Tricuspid regurgitation signal is inadequate for assessing PA pressure. Left Atrium: Left atrial size was normal in size. Right Atrium: Right atrial size was normal in size. Pericardium: There is no evidence of pericardial effusion. Mitral Valve: The mitral valve is normal in structure. Trivial mitral valve regurgitation. No evidence of mitral valve stenosis. MV peak gradient, 2.3 mmHg. The mean mitral valve gradient is 1.0 mmHg. Tricuspid Valve: The tricuspid valve is normal in structure. Tricuspid valve regurgitation is not demonstrated. No evidence of tricuspid stenosis. Aortic Valve: The aortic valve is tricuspid. Aortic valve regurgitation is trivial. No aortic stenosis is present. Aortic valve mean gradient measures 2.0 mmHg. Aortic valve peak gradient measures 3.1 mmHg. Aortic valve area, by VTI measures 2.52 cm. Pulmonic Valve: The pulmonic valve was normal in structure. Pulmonic valve regurgitation is not visualized. No evidence of pulmonic stenosis. Aorta: The aortic root is normal in size and structure. Venous: The inferior vena cava is normal in size with greater than 50% respiratory variability, suggesting right atrial pressure of 3 mmHg. IAS/Shunts: No atrial level shunt detected by color flow Doppler.  LEFT VENTRICLE PLAX 2D LVIDd:         5.70 cm      Diastology LVIDs:         4.90 cm      LV e' medial:    7.40 cm/s LV PW:         1.20 cm      LV E/e' medial:  7.8 LV IVS:        0.90 cm      LV e' lateral:   12.10 cm/s LVOT diam:     2.30 cm      LV E/e' lateral: 4.8 LV SV:         34 LV SV Index:   17 LVOT Area:     4.15 cm  LV Volumes (MOD) LV vol d, MOD A2C: 139.0 ml LV vol  d, MOD A4C: 210.0 ml  LV vol s, MOD A2C: 60.9 ml LV vol s, MOD A4C: 112.0 ml LV SV MOD A2C:     78.1 ml LV SV MOD A4C:     210.0 ml LV SV MOD BP:      85.9 ml RIGHT VENTRICLE             IVC RV Basal diam:  3.40 cm     IVC diam: 1.20 cm RV Mid diam:    2.80 cm RV S prime:     11.80 cm/s TAPSE (M-mode): 1.8 cm LEFT ATRIUM             Index        RIGHT ATRIUM           Index LA diam:        4.20 cm 2.12 cm/m   RA Area:     10.50 cm LA Vol (A2C):   72.5 ml 36.53 ml/m  RA Volume:   21.20 ml  10.68 ml/m LA Vol (A4C):   35.5 ml 17.89 ml/m LA Biplane Vol: 51.2 ml 25.80 ml/m  AORTIC VALVE                    PULMONIC VALVE AV Area (Vmax):    2.85 cm     PV Vmax:       0.75 m/s AV Area (Vmean):   2.12 cm     PV Peak grad:  2.2 mmHg AV Area (VTI):     2.52 cm AV Vmax:           88.50 cm/s AV Vmean:          62.500 cm/s AV VTI:            0.136 m AV Peak Grad:      3.1 mmHg AV Mean Grad:      2.0 mmHg LVOT Vmax:         60.70 cm/s LVOT Vmean:        31.900 cm/s LVOT VTI:          0.083 m LVOT/AV VTI ratio: 0.61  AORTA Ao Root diam: 3.70 cm Ao Asc diam:  3.65 cm MITRAL VALVE MV Area (PHT): 4.06 cm    SHUNTS MV Area VTI:   2.38 cm    Systemic VTI:  0.08 m MV Peak grad:  2.3 mmHg    Systemic Diam: 2.30 cm MV Mean grad:  1.0 mmHg MV Vmax:       0.76 m/s MV Vmean:      44.9 cm/s MV Decel Time: 187 msec MV E velocity: 57.80 cm/s MV A velocity: 46.00 cm/s MV E/A ratio:  1.26 Mihai Croitoru MD Electronically signed by Luana Rumple MD Signature Date/Time: 04/06/2024/5:52:07 PM    Final    DG Swallowing Func-Speech Pathology Result Date: 04/06/2024 Table formatting from the original result was not included. Modified Barium Swallow Study Patient Details Name: Joel Webb MRN: [id number] Date of Birth: 26-May-1946 Today's Date: 04/06/2024 HPI/PMH: HPI: Pt is a 78 yo male presenting from ALF with frequent falls and choking episodes. MBS in 2023 revealed minimal oropharyngeal dysphagia with aspiration of thin liquids on  sequential sips (PAS 7, sensed but not cleared by cough). PMH includes: PSP, GERD, MCI, HTN, HLD, CKD, DMII Clinical Impression: Pt has an oropharyngeal dysphagia with reduced coordination that contributes to aspiration with thin and nectar thick liquids. His oral phase is marked by repetitive/disorganized lingual motion, disorganized chewing with small pieces of cracker  not fully masticated before swallowing, and inconsistent oral clearance. Premature spillage was noted, as was piecemeal swallowing at times, making amount of oral residue quite variable, although ultimately he cleared his mouth well. The barium tablet could not be orally transited though and was manually removed. Reduced oral coordination resulted in inconsistent site of initiation, with liquids often spilling into his airway before airway closure. Aspiration of thin and nectar thick liquids is inconsistently sensed (PAS 7 and 8), and even when he does cough, he does not clear his airway. Coughing sometimes appears to be delayed, and sometimes he coughs without overt connection to aspiration, which could be indicative of further delayed sensation. Pharyngeally there is reduced soft palate elevation, anterior hyoid movement, laryngeal vestibule closure, epiglottic movement, and pharyngeal squeeze that contribute to both reduced safety and efficiency. A collection of residue remains primarily in the valleculae. Trace penetration, sometimes from this residue, occurs after the swallow with honey thick liquids. Compensatory strategies tested include oral hold, chin tuck, and use of straw, but they did not significantly improve airway protection. Small, single sips (cued to "sip on hot coffee") resulted in better airway protection with thin liquids (penetration x1) but not with nectar thick liquids (PAS 7). Discussed findings with pt and spouse, who was present for the study. We discussed a more conservative approach, including honey thick liquids (with  water protocol) as this did not result in aspiration even with larger volumes and self-pacing. An alternative option would be to remain on thin liquids, as this is his baseline diet and he appears to have been tolerating it with no pulmonary issues despite signs of dysphagia for months. If on thin liquids, would consider a Provale cup to regulate bolus size until he can more consistently take small, single sips. Either option could include f/u SLP for swallowing therapy to optimize function. At this time, pt and his wife would like to remain on mechanical soft diet and thin liquids, with careful use of strategies to reduce but not eliminate aspiration risk. Factors that may increase risk of adverse event in presence of aspiration [name] & [name] 2021): Factors that may increase risk of adverse event in presence of aspiration [name] & [name] 2021): Reduced cognitive function; Limited mobility; Weak cough; Aspiration of thick, dense, and/or acidic materials Recommendations/Plan: Swallowing Evaluation Recommendations Swallowing Evaluation Recommendations Recommendations: PO diet PO Diet Recommendation: Dysphagia 3 (Mechanical soft); Thin liquids (Level 0) Liquid Administration via: Cup; Other (Comment) (provale cup) Medication Administration: Whole meds with puree Supervision: Patient able to self-feed; Full supervision/cueing for swallowing strategies Swallowing strategies  : Minimize environmental distractions; Slow rate; Small bites/sips (one small sip at a time, like you are "sipping on hot coffee" (can use Provale cup)) Postural changes: Position pt fully upright for meals Oral care recommendations: Oral care QID (4x/day) Treatment Plan Treatment Plan Treatment recommendations: Therapy as outlined in treatment plan below Follow-up recommendations: Skilled nursing-short term rehab (<3 hours/day) Functional status assessment: Patient has had a recent decline in their functional status and demonstrates the  ability to make significant improvements in function in a reasonable and predictable amount of time. Treatment frequency: Min 2x/week Treatment duration: 2 weeks Interventions: Aspiration precaution training; Compensatory techniques; Patient/family education; Trials of upgraded texture/liquids; Respiratory muscle strength training Recommendations Recommendations for follow up therapy are one component of a multi-disciplinary discharge planning process, led by the attending physician.  Recommendations may be updated based on patient status, additional functional criteria and insurance authorization. Assessment: Orofacial Exam: Orofacial Exam Oral Cavity:  Oral Hygiene: WFL Oral Cavity - Dentition: Adequate natural dentition; Other (Comment) (partial) Orofacial Anatomy: WFL Anatomy: Anatomy: WFL Boluses Administered: Boluses Administered Boluses Administered: Thin liquids (Level 0); Mildly thick liquids (Level 2, nectar thick); Moderately thick liquids (Level 3, honey thick); Puree; Solid  Oral Impairment Domain: Oral Impairment Domain Lip Closure: No labial escape (as can be observed - limited view throughout the study) Tongue control during bolus hold: Posterior escape of less than half of bolus Bolus preparation/mastication: Disorganized chewing/mashing with solid pieces of bolus unchewed Bolus transport/lingual motion: Repetitive/disorganized tongue motion Oral residue: Majority of bolus remaining Location of oral residue : Floor of mouth; Tongue; Lateral sulci Initiation of pharyngeal swallow : Pyriform sinuses  Pharyngeal Impairment Domain: Pharyngeal Impairment Domain Soft palate elevation: Trace column of contrast or air between SP and PW Laryngeal elevation: Complete superior movement of thyroid  cartilage with complete approximation of arytenoids to epiglottic petiole Anterior hyoid excursion: Partial anterior movement Epiglottic movement: Partial inversion Laryngeal vestibule closure: Incomplete, narrow  column air/contrast in laryngeal vestibule Pharyngeal stripping wave : Present - diminished Pharyngeal contraction (A/P view only): N/A Pharyngoesophageal segment opening: Complete distension and complete duration, no obstruction of flow Tongue base retraction: No contrast between tongue base and posterior pharyngeal wall (PPW) Pharyngeal residue: Collection of residue within or on pharyngeal structures Location of pharyngeal residue: Tongue base; Valleculae  Esophageal Impairment Domain: Esophageal Impairment Domain Esophageal clearance upright position: Complete clearance, esophageal coating Pill: Pill Consistency administered: Puree Puree: Impaired (see clinical impressions) Penetration/Aspiration Scale Score: Penetration/Aspiration Scale Score 1.  Material does not enter airway: Puree 3.  Material enters airway, remains ABOVE vocal cords and not ejected out: Solid 5.  Material enters airway, CONTACTS cords and not ejected out: Moderately thick liquids (Level 3, honey thick) 8.  Material enters airway, passes BELOW cords without attempt by patient to eject out (silent aspiration) : Thin liquids (Level 0); Mildly thick liquids (Level 2, nectar thick) Compensatory Strategies: Compensatory Strategies Compensatory strategies: Yes Straw: Ineffective Ineffective Straw: Thin liquid (Level 0) Chin tuck: Ineffective Ineffective Chin Tuck: Thin liquid (Level 0) Oral bolus hold: Ineffective Ineffective Oral Bolus Hold : Mildly thick liquid (Level 2, nectar thick)   General Information: Caregiver present: Yes (wife)  Diet Prior to this Study: Dysphagia 3 (mechanical soft); Other (Comment) (with sips of water)   Temperature : Normal   Respiratory Status: WFL   Supplemental O2: None (Room air)   History of Recent Intubation: No  Behavior/Cognition: Alert; Cooperative; Pleasant mood Self-Feeding Abilities: Able to self-feed Baseline vocal quality/speech: Normal (speech dysarthric) Volitional Cough: Able to elicit Volitional  Swallow: Able to elicit Exam Limitations: No limitations Goal Planning: Prognosis for improved oropharyngeal function: Good No data recorded No data recorded Patient/Family Stated Goal: want his swallowing assessed, wants him to get more help upon discharge Consulted and agree with results and recommendations: Patient; Family member/caregiver Pain: Pain Assessment Pain Assessment: Faces Faces Pain Scale: 0 End of Session: Start Time:SLP Start Time (ACUTE ONLY): 1257 Stop Time: SLP Stop Time (ACUTE ONLY): 1328 Time Calculation:SLP Time Calculation (min) (ACUTE ONLY): 31 min Charges: SLP Evaluations $ SLP Speech Visit: 1 Visit SLP Evaluations $BSS Swallow: 1 Procedure $MBS Swallow: 1 Procedure SLP visit diagnosis: SLP Visit Diagnosis: Dysphagia, unspecified (R13.10) Past Medical History: Past Medical History: Diagnosis Date  Chronic kidney disease, stage 3 01/11/2021  ED (erectile dysfunction)   Essential hypertension 06/05/2017  Frequent falls   Gait abnormality   Gastroesophageal reflux disease 01/11/2021  Major depressive disorder  Microalbuminuria due to type 2 diabetes mellitus 05/25/2020  Mild cognitive impairment of uncertain or unknown etiology   Mixed hyperlipidemia 02/28/2016  Multiple lacunar infarcts   Plantar fasciitis   Prostatitis   Pure hypercholesterolemia 01/11/2021  PVC's (premature ventricular contractions) 01/28/2022  Uncontrolled type 2 diabetes mellitus with hyperglycemia, with long-term current use of insulin  02/28/2016 Past Surgical History: Past Surgical History: Procedure Laterality Date  CIRCUMCISION    COLONOSCOPY    MOUTH SURGERY  01/2020 [name]., M.A. CCC-SLP Acute Rehabilitation Services Office: [phone] Secure chat preferred 04/06/2024, 2:36 PM  DG Chest 1 View Result Date: 04/05/2024 CLINICAL DATA:  Fall EXAM: CHEST  1 VIEW COMPARISON:  03/02/2024 FINDINGS: Borderline to mild cardiomegaly with small pleural effusions and vascular congestion similar compared to prior. Patchy  atelectasis or infiltrate at the left base. No pneumothorax IMPRESSION: Borderline to mild cardiomegaly with small pleural effusions and vascular congestion. Patchy atelectasis or infiltrate at the left base. Electronically Signed   By: Esmeralda Hedge M.D.   On: 04/05/2024 22:10   CT Head Wo Contrast Result Date: 04/05/2024 CLINICAL DATA:  Multiple falls EXAM: CT HEAD WITHOUT CONTRAST CT CERVICAL SPINE WITHOUT CONTRAST TECHNIQUE: Multidetector CT imaging of the head and cervical spine was performed following the standard protocol without intravenous contrast. Multiplanar CT image reconstructions of the cervical spine were also generated. RADIATION DOSE REDUCTION: This exam was performed according to the departmental dose-optimization program which includes automated exposure control, adjustment of the mA and/or kV according to patient size and/or use of iterative reconstruction technique. COMPARISON:  03/02/2024 FINDINGS: CT HEAD FINDINGS Brain: No evidence of acute infarction, hemorrhage, hydrocephalus, extra-axial collection or mass lesion/mass effect. Periventricular white matter hypodensity. Lacunar infarction of the right lentiform nuclei. Vascular: No hyperdense vessel or unexpected calcification. Skull: Normal. Negative for fracture or focal lesion. Sinuses/Orbits: No acute finding. Other: None. CT CERVICAL SPINE FINDINGS Alignment: Normal. Skull base and vertebrae: No acute fracture. No primary bone lesion or focal pathologic process. Soft tissues and spinal canal: No prevertebral fluid or swelling. No visible canal hematoma. Disc levels: Focally moderate disc space height loss of C6-C7 with otherwise intact disc spaces. Upper chest: Negative. Other: None. IMPRESSION: 1. No acute intracranial pathology. Small-vessel white matter disease and old lacunar infarction of the right lentiform nuclei. 2. No fracture or static subluxation of the cervical spine. 3. Focally moderate disc space height loss of C6-C7  with otherwise intact disc spaces. Electronically Signed   By: Fredricka Jenny M.D.   On: 04/05/2024 21:58   CT Cervical Spine Wo Contrast Result Date: 04/05/2024 CLINICAL DATA:  Multiple falls EXAM: CT HEAD WITHOUT CONTRAST CT CERVICAL SPINE WITHOUT CONTRAST TECHNIQUE: Multidetector CT imaging of the head and cervical spine was performed following the standard protocol without intravenous contrast. Multiplanar CT image reconstructions of the cervical spine were also generated. RADIATION DOSE REDUCTION: This exam was performed according to the departmental dose-optimization program which includes automated exposure control, adjustment of the mA and/or kV according to patient size and/or use of iterative reconstruction technique. COMPARISON:  03/02/2024 FINDINGS: CT HEAD FINDINGS Brain: No evidence of acute infarction, hemorrhage, hydrocephalus, extra-axial collection or mass lesion/mass effect. Periventricular white matter hypodensity. Lacunar infarction of the right lentiform nuclei. Vascular: No hyperdense vessel or unexpected calcification. Skull: Normal. Negative for fracture or focal lesion. Sinuses/Orbits: No acute finding. Other: None. CT CERVICAL SPINE FINDINGS Alignment: Normal. Skull base and vertebrae: No acute fracture. No primary bone lesion or focal pathologic process. Soft tissues and spinal canal: No  prevertebral fluid or swelling. No visible canal hematoma. Disc levels: Focally moderate disc space height loss of C6-C7 with otherwise intact disc spaces. Upper chest: Negative. Other: None. IMPRESSION: 1. No acute intracranial pathology. Small-vessel white matter disease and old lacunar infarction of the right lentiform nuclei. 2. No fracture or static subluxation of the cervical spine. 3. Focally moderate disc space height loss of C6-C7 with otherwise intact disc spaces. Electronically Signed   By: Fredricka Jenny M.D.   On: 04/05/2024 21:58        Scheduled Meds:  atorvastatin  80 mg Oral  Daily   enoxaparin  (LOVENOX ) injection  40 mg Subcutaneous Daily   ezetimibe   10 mg Oral Daily   insulin  aspart  0-5 Units Subcutaneous QHS   insulin  aspart  0-9 Units Subcutaneous TID WC   losartan  25 mg Oral Daily   metoprolol tartrate  12.5 mg Oral BID   spironolactone  12.5 mg Oral Daily   Continuous Infusions:   LOS: 0 days    Time spent: 50 minutes spent on 04/07/2024 caring for this patient face-to-face including chart review, ordering labs/tests, documenting, discussion with nursing staff, consultants, updating family and interview/physical exam    Rema [facility] [place], DO [facility] Available via Epic secure chat 7am-7pm After these hours, please refer to coverage provider listed on [email or web address] 04/07/2024, 1:41 PM

## 2024-04-07 NOTE — Progress Notes (Signed)
 Mobility Specialist Progress Note:    04/07/24 0952  Mobility  Activity Ambulated with assistance in hallway  Level of Assistance Contact guard assist, steadying assist  Assistive Device Front wheel walker  Distance Ambulated (ft) 90 ft  Activity Response Tolerated well  Mobility Referral Yes  Mobility visit 1 Mobility  Mobility Specialist Start Time (ACUTE ONLY) [id number]  Mobility Specialist Stop Time (ACUTE ONLY) [id number]  Mobility Specialist Time Calculation (min) (ACUTE ONLY) 10 min   Pt received in bed and agreeable. Required minA to come EOB and stand. Ambulated w/ contact guard and directional cues in hallway. Distance limited d/t complaints of some lightheadedness. Pt left in bed with call bell and all needs met. Bed alarm on.  Joel Webb Mobility Specialist Please contact via Special educational needs teacher or Rehab office at [phone]

## 2024-04-07 NOTE — TOC Progression Note (Addendum)
 Transition of Care [facility]) - Progression Note    Patient Details  Name: Joel Webb MRN: [id number] Date of Birth: 04-Apr-1946  [facility] of [facility]) CM/SW Contact  Elspeth Hals, LCSW Phone Number: 04/07/2024, 10:04 AM  Clinical Narrative:   SNF referral sent to [facility] confirms they can receive pt.  SNF auth request made to [facility]. No PTAR request.   PASSR docs uploaded to [facility] Must  Expected Discharge Plan: Skilled Nursing Facility Barriers to Discharge: Other (must enter comment) (insurance auth)  Expected Discharge Plan and Services In-house Referral: Clinical Social Work   Post Acute Care Choice: Skilled Nursing Facility Living arrangements for the past 2 months: Assisted Living Facility ([facility])                                       Social Determinants of Health (SDOH) Interventions SDOH Screenings   Food Insecurity: No Food Insecurity (04/06/2024)  Housing: Low Risk  (04/06/2024)  Transportation Needs: No Transportation Needs (04/06/2024)  Utilities: Not At Risk (04/06/2024)  Depression (PHQ2-9): Low Risk  (07/10/2020)  Social Connections: Socially Isolated (04/06/2024)  Tobacco Use: Medium Risk (04/05/2024)    Readmission Risk Interventions     No data to display

## 2024-04-07 NOTE — Evaluation (Signed)
 Occupational Therapy Evaluation Patient Details Name: Joel Webb MRN: [id number] DOB: 09/30/1946 Today's Date: 04/07/2024   History of Present Illness   Pt is a 78 y.o. male presenting to [facility] ED on 04/05/24 from ALF with frequent falls and choking episodes. PMH is significant for HTN, HLD, CKD, T2 DM, GERD, and progressive supranuclear palsy.     Clinical Impressions Pt admitted based on above, and was seen based on problem list below. PTA pt was at ALF receiving min to mod assistance with ADLs. Today pt is requiring set up  to mod assist for ADLs. Bed mobility was supervision and functional transfers are up to mod assist depending on pt's energy levels. Pt presenting with decreased awareness and insight into deficits. Recommendation of <3 hours of skilled rehab daily. OT will continue to follow acutely to maximize functional independence.        If plan is discharge home, recommend the following:   A lot of help with walking and/or transfers;A lot of help with bathing/dressing/bathroom     Functional Status Assessment   Patient has had a recent decline in their functional status and demonstrates the ability to make significant improvements in function in a reasonable and predictable amount of time.     Equipment Recommendations   Other (comment) (Defer to next venue)     Recommendations for Other Services         Precautions/Restrictions   Precautions Precautions: Fall Recall of Precautions/Restrictions: Intact Precaution/Restrictions Comments: PSP Restrictions Weight Bearing Restrictions Per Provider Order: No     Mobility Bed Mobility Overal bed mobility: Needs Assistance Bed Mobility: Sit to Supine       Sit to supine: Supervision   General bed mobility comments: Pt able to boost self up in bed no assist    Transfers Overall transfer level: Needs assistance Equipment used: Rolling walker (2 wheels) Transfers: Sit to/from Stand, Bed to  chair/wheelchair/BSC Sit to Stand: Supervision     Step pivot transfers: Min assist, Mod assist     General transfer comment: Min to mod assist d/t R lateral lean, depending on pt's fatigue levels      Balance Overall balance assessment: Needs assistance Sitting-balance support: No upper extremity supported, Feet supported Sitting balance-Leahy Scale: Fair   Postural control: Right lateral lean, Posterior lean Standing balance support: Bilateral upper extremity supported, Reliant on assistive device for balance Standing balance-Leahy Scale: Poor Standing balance comment: Heavy R lateal lean without UE support in standing                           ADL either performed or assessed with clinical judgement   ADL Overall ADL's : Needs assistance/impaired Eating/Feeding: Set up;Sitting   Grooming: Standing;Moderate assistance;Oral care Grooming Details (indicate cue type and reason): Heavy mod d/t R lateral lean without UE support Upper Body Bathing: Set up;Sitting   Lower Body Bathing: Moderate assistance;Sit to/from stand Lower Body Bathing Details (indicate cue type and reason): Mod to steady in standing Upper Body Dressing : Set up;Sitting   Lower Body Dressing: Moderate assistance;Sit to/from stand Lower Body Dressing Details (indicate cue type and reason): Mod assist to steady Toilet Transfer: Minimal assistance;Ambulation;Rolling walker (2 wheels);Regular Teacher, adult education Details (indicate cue type and reason): Min assist to steady Toileting- Clothing Manipulation and Hygiene: Minimal assistance;Sit to/from stand Toileting - Clothing Manipulation Details (indicate cue type and reason): Min assist to steady during clothing management     Functional mobility  during ADLs: Minimal assistance;Moderate assistance General ADL Comments: Decreased safety insight into standing balance, especially if fatigues     Vision Baseline Vision/History: 0 No visual  deficits Vision Assessment?: No apparent visual deficits            Pertinent Vitals/Pain Pain Assessment Pain Assessment: No/denies pain     Extremity/Trunk Assessment Upper Extremity Assessment Upper Extremity Assessment: Generalized weakness (PMH L sided weakness from CVA)   Lower Extremity Assessment Lower Extremity Assessment: Defer to PT evaluation   Cervical / Trunk Assessment Cervical / Trunk Assessment: Normal   Communication Communication Communication: Impaired Factors Affecting Communication: Other (comment) (Delayed processing speed)   Cognition Arousal: Alert Behavior During Therapy: Flat affect Cognition: Cognition impaired     Awareness: Intellectual awareness intact, Online awareness impaired Memory impairment (select all impairments): Working memory Attention impairment (select first level of impairment): Selective attention Executive functioning impairment (select all impairments): Reasoning, Problem solving OT - Cognition Comments: Pt with poor safety awareness, difficulty following commands, poor insight into extent of deficits         Following commands: Impaired Following commands impaired: Follows one step commands inconsistently, Follows one step commands with increased time     Cueing  General Comments   Cueing Techniques: Verbal cues;Visual cues  Pt received in bathroom from NT           Home Living Family/patient expects to be discharged to:: Assisted living         Home Equipment: Rolling Walker (2 wheels);Lift chair;Transport chair   Additional Comments: pt has been residing at [facility]      Prior Functioning/Environment Prior Level of Function : Needs assist       Mobility Comments: pt ambulates with RW, has had a history of multiple falls in various directions ADLs Comments: Assistance with bathing and LB dressing    OT Problem List: Decreased strength;Decreased range of motion;Decreased activity  tolerance;Impaired balance (sitting and/or standing)   OT Treatment/Interventions: Self-care/ADL training;Therapeutic exercise;Neuromuscular education;DME and/or AE instruction;Energy conservation;Therapeutic activities;Patient/family education;Balance training      OT Goals(Current goals can be found in the care plan section)   Acute Rehab OT Goals Patient Stated Goal: To go home OT Goal Formulation: With patient Time For Goal Achievement: 04/21/24 Potential to Achieve Goals: Good   OT Frequency:  Min 2X/week       AM-PAC OT "6 Clicks" Daily Activity     Outcome Measure Help from another person eating meals?: None Help from another person taking care of personal grooming?: A Little Help from another person toileting, which includes using toliet, bedpan, or urinal?: A Lot Help from another person bathing (including washing, rinsing, drying)?: A Lot Help from another person to put on and taking off regular upper body clothing?: A Little Help from another person to put on and taking off regular lower body clothing?: A Lot 6 Click Score: 16   End of Session Equipment Utilized During Treatment: Rolling walker (2 wheels) Nurse Communication: Mobility status  Activity Tolerance: Patient tolerated treatment well Patient left: in bed;with call bell/phone within reach;with bed alarm set  OT Visit Diagnosis: Unsteadiness on feet (R26.81);Other abnormalities of gait and mobility (R26.89);Repeated falls (R29.6);Muscle weakness (generalized) (M62.81)                Time: 1610-9604 OT Time Calculation (min): 14 min Charges:  OT General Charges $OT Visit: 1 Visit OT Evaluation $OT Eval Moderate Complexity: 1 Mod  Cindy Brindisi C, OT  Acute Rehabilitation Services Office  [phone] Secure chat preferred   Mickael Alamo 04/07/2024, 10:27 AM

## 2024-04-07 NOTE — Consult Note (Addendum)
 Cardiology Consultation   Patient ID: Joel Webb MRN: [id number]; DOB: 1946-08-07  Admit date: 04/05/2024 Date of Consult: 04/07/2024  PCP:  Rae Bugler, MD   [facility] Providers Cardiologist:  Maudine Sos, MD   {  Patient Profile:   Joel Webb is a 78 y.o. male with a hx of hypertension, hyperlipidemia, CKD 3, diabetes mellitus, GERD, progressive supernuclear palsy (affecting walking, balance, eye movements, swallowing, and cognitive deficits), frequent falls who is being seen 04/07/2024 for the evaluation of new heart failure at the request of Dr. [place].  History of Present Illness:   Joel Webb is followed by cardiology for new syncopal event in December 2022 in the setting of hypotension and recent increase in antihypertensive regiment with also noted poor p.o. intake.  Heart monitor ordered and showing rare PACs/PVCs.  Also seen in the ED with mechanical fall April 2025 with vascular congestion noted on imaging.  We followed up with him outpatient where echocardiogram was recommended however patient declined at that time.  At that time was on 20 mg of Lasix  once per week.    Currently patient being evaluated for fall, choking episodes, new onset HFrEF EF 35 to 40% although echocardiogram was performed without contrast and of questionable accuracy.  MRI of the head negative for any acute pathology but did note old lacunar infarct.  BNP greater than 800, chest x-ray with vascular congestion and/or infiltrate.  Cardiology asked to evaluate.  At baseline patient seems to have some some degree mild cognitive deficits to respond to very simple questions and often spacey not completely present.  Patient right now not complaining of any chest pain, shortness of breath, peripheral edema.  Reports infrequent complaints of chest pain that occur when he is stationary in bed flat.  Reports it feeling as a heaviness but not able to provide much more detail than this.   Attempted to discuss more about his CHF, however patient right now would like to discharge really does not want to be on any medications as he reportedly was told by previous provider to avoid meds.   Potassium 3.8.  Creatinine 1.28.  Albumin 3.1.  Hemoglobin 14.9   Past Medical History:  Diagnosis Date   Chronic kidney disease, stage 3 01/11/2021   ED (erectile dysfunction)    Essential hypertension 06/05/2017   Frequent falls    Gait abnormality    Gastroesophageal reflux disease 01/11/2021   Major depressive disorder    Microalbuminuria due to type 2 diabetes mellitus 05/25/2020   Mild cognitive impairment of uncertain or unknown etiology    Mixed hyperlipidemia 02/28/2016   Multiple lacunar infarcts    Plantar fasciitis    Prostatitis    Pure hypercholesterolemia 01/11/2021   PVC's (premature ventricular contractions) 01/28/2022   Uncontrolled type 2 diabetes mellitus with hyperglycemia, with long-term current use of insulin  02/28/2016    Past Surgical History:  Procedure Laterality Date   CIRCUMCISION     COLONOSCOPY     MOUTH SURGERY  01/2020     Inpatient Medications: Scheduled Meds:  atorvastatin  80 mg Oral Daily   enoxaparin  (LOVENOX ) injection  40 mg Subcutaneous Daily   ezetimibe   10 mg Oral Daily   insulin  aspart  0-5 Units Subcutaneous QHS   insulin  aspart  0-9 Units Subcutaneous TID WC   losartan  25 mg Oral Daily   metoprolol tartrate  12.5 mg Oral BID   spironolactone  12.5 mg Oral Daily   Continuous Infusions:  PRN Meds: acetaminophen , melatonin, polyethylene glycol, prochlorperazine   Allergies:   No Known Allergies  Social History:   Social History   Socioeconomic History   Marital status: Married    Spouse name: Libby Ree   Number of children: 1   Years of education: 16   Highest education level: Bachelor's degree (e.g., BA, AB, BS)  Occupational History   Occupation: Retired    Comment: Airline pilot  Tobacco Use   Smoking status: Former     Current packs/day: 0.00    Types: Cigarettes    Quit date: 12/20/2008    Years since quitting: 15.3   Smokeless tobacco: Never  Substance and Sexual Activity   Alcohol use: Yes    Comment: rarely   Drug use: No   Sexual activity: Not on file  Other Topics Concern   Not on file  Social History Narrative   01/26/20 Lives with wife   Caffeine, none   Social Drivers of Corporate investment banker Strain: Not on file  Food Insecurity: No Food Insecurity (04/06/2024)   Hunger Vital Sign    Worried About Running Out of Food in the Last Year: Never true    Ran Out of Food in the Last Year: Never true  Transportation Needs: No Transportation Needs (04/06/2024)   PRAPARE - Administrator, Civil Service (Medical): No    Lack of Transportation (Non-Medical): No  Physical Activity: Not on file  Stress: Not on file  Social Connections: Socially Isolated (04/06/2024)   Social Connection and Isolation Panel [NHANES]    Frequency of Communication with Friends and Family: Once a week    Frequency of Social Gatherings with Friends and Family: Once a week    Attends Religious Services: Never    Database administrator or Organizations: No    Attends Engineer, structural: Not on file    Marital Status: Married  Catering manager Violence: Not At Risk (04/06/2024)   Humiliation, Afraid, Rape, and Kick questionnaire    Fear of Current or Ex-Partner: No    Emotionally Abused: No    Physically Abused: No    Sexually Abused: No    Family History:   Family History  Problem Relation Age of Onset   Hepatitis C Mother    Mesothelioma Father    Diabetes Father    Heart disease Father      ROS:  Please see the history of present illness.  All other ROS reviewed and negative.     Physical Exam/Data:   Vitals:   04/06/24 2128 04/07/24 0425 04/07/24 0433 04/07/24 0809  BP: (!) 140/92  (!) 144/85 (!) 142/102  Pulse: (!) 105  91 96  Resp: 17 18 17 20   Temp: 98.2 F (36.8 C)   98.7 F (37.1 C) 97.6 F (36.4 C)  TempSrc:   Oral   SpO2: 92%  (!) 88% 93%  Weight:  78 kg      Intake/Output Summary (Last 24 hours) at 04/07/2024 1354 Last data filed at 04/07/2024 1236 Gross per 24 hour  Intake 540 ml  Output 2 ml  Net 538 ml      04/07/2024    4:25 AM 03/12/2024    2:39 PM 03/02/2024    6:31 PM  Last 3 Weights  Weight (lbs) 171 lb 15.3 oz 182 lb 175 lb  Weight (kg) 78 kg 82.555 kg 79.379 kg     Body mass index is 25.39 kg/m.  General:  Well nourished, well  developed, in no acute distress. HEENT: normal Neck: no JVD Vascular: No carotid bruits; Distal pulses 2+ bilaterally Cardiac:  normal S1, S2; RRR; no murmur  Lungs: Diminished breath sounds but do sound clear. Abd: soft, nontender, no hepatomegaly  Ext: no edema Musculoskeletal:  No deformities, BUE and BLE strength normal and equal Skin: warm and dry  Neuro:  CNs 2-12 intact, no focal abnormalities noted Psych:  Normal affect   EKG:  The EKG was personally reviewed and demonstrates: Sinus rhythm heart rate 82.  Nonspecific T wave changes.  Incomplete bundle branch block Telemetry:  Telemetry was personally reviewed and demonstrates: Sinus, occasional PVCs in the pattern of trigeminy at times.  Relevant CV Studies: Echocardiogram 04/06/2024 1. Left ventricular ejection fraction, by estimation, is 35 to 40%. The  left ventricle has moderately decreased function. Left ventricular  endocardial border not optimally defined to evaluate regional wall motion.  The left ventricular internal cavity  size was mildly dilated. Left ventricular diastolic parameters are  indeterminate.   2. Right ventricular systolic function is normal. The right ventricular  size is normal. Tricuspid regurgitation signal is inadequate for assessing  PA pressure.   3. The mitral valve is normal in structure. Trivial mitral valve  regurgitation. No evidence of mitral stenosis.   4. The aortic valve is tricuspid. Aortic valve  regurgitation is trivial.  No aortic stenosis is present.   5. The inferior vena cava is normal in size with greater than 50%  respiratory variability, suggesting right atrial pressure of 3 mmHg.   Comparison(s): Patient did not have IV access for definity microbubble  contrast. Images are technically difficult and estimation of left  ventricular systolic function is of low confidence.   Conclusion(s)/Recommendation(s): The study should be repeated with  Definity contrast or an alternative imaging modality should be used.   Laboratory Data:  High Sensitivity Troponin:  No results for input(s): "TROPONINIHS" in the last 720 hours.   Chemistry Recent Labs  Lab 04/05/24 2229 04/06/24 0433 04/07/24 0651  NA 138 137 138  K 4.0 3.7 3.8  CL 107 109 109  CO2 22 22 20*  GLUCOSE 176* 147* 165*  BUN 25* 22 20  CREATININE 1.46* 1.32* 1.25*  CALCIUM 9.3 8.8* 8.7*  MG  --  2.0  --   GFRNONAA 49* 56* 59*  ANIONGAP 9 6 9     Recent Labs  Lab 04/05/24 2229 04/07/24 0651  PROT 6.5 6.0*  ALBUMIN 3.4* 3.1*  AST 16 14*  ALT 12 11  ALKPHOS 98 96  BILITOT 0.7 1.2   Lipids No results for input(s): "CHOL", "TRIG", "HDL", "LABVLDL", "LDLCALC", "CHOLHDL" in the last 168 hours.  Hematology Recent Labs  Lab 04/05/24 2229 04/06/24 0433 04/07/24 0651  WBC 8.4 8.8 8.0  RBC 4.89 5.10 5.14  HGB 14.4 14.6 14.9  HCT 42.6 44.6 44.2  MCV 87.1 87.5 86.0  MCH 29.4 28.6 29.0  MCHC 33.8 32.7 33.7  RDW 14.3 14.2 14.3  PLT 143* 143* 130*   Thyroid  No results for input(s): "TSH", "FREET4" in the last 168 hours.  BNP Recent Labs  Lab 04/05/24 2229  BNP 878.5*    DDimer No results for input(s): "DDIMER" in the last 168 hours.   Radiology/Studies:  MR BRAIN WO CONTRAST Result Date: 04/06/2024 CLINICAL DATA:  Initial evaluation for mental status change, unknown cause. EXAM: MRI HEAD WITHOUT CONTRAST TECHNIQUE: Multiplanar, multiecho pulse sequences of the brain and surrounding structures were  obtained without intravenous contrast. COMPARISON:  CT from 04/05/2024 FINDINGS: Brain: Mild age-related cerebral atrophy. Patchy T2/FLAIR hyperintensity involving the supratentorial cerebral white matter and pons, consistent with chronic small vessel ischemic disease, mild in nature. Remote lacunar infarct present at the right basal ganglia. No evidence for acute or subacute infarct. No acute or chronic intracranial blood products. No mass lesion, midline shift or mass effect. No hydrocephalus or extra-axial fluid collection. Pituitary gland within normal limits. Vascular: Major intracranial vascular flow voids are maintained. Skull and upper cervical spine: Craniocervical junction with normal limits. Bone marrow signal intensity normal. No scalp soft tissue abnormality. Sinuses/Orbits: Globes orbital soft tissues within normal limits. Small right maxillary sinus retention cyst noted. Paranasal sinuses are otherwise clear. No mastoid effusion. Other: None. IMPRESSION: 1. No acute intracranial abnormality. 2. Mild age-related cerebral atrophy with chronic small vessel ischemic disease, with remote lacunar infarct at the right basal ganglia. Electronically Signed   By: Virgia Griffins M.D.   On: 04/06/2024 18:23   ECHOCARDIOGRAM COMPLETE Result Date: 04/06/2024    ECHOCARDIOGRAM REPORT   Patient Name:   Joel Webb Date of Exam: 04/06/2024 Medical Rec #:  [id number]        Height:       69.0 in Accession #:    [id number]       Weight:       182.0 lb Date of Birth:  1946/02/20       BSA:          1.985 m Patient Age:    77 years         BP:           107/59 mmHg Patient Gender: M                HR:           87 bpm. Exam Location:  Inpatient Procedure: 2D Echo, 3D Echo, Color Doppler and Cardiac Doppler (Both Spectral            and Color Flow Doppler were utilized during procedure). Indications:    CHF-acute diastolic  History:        Patient has no prior history of Echocardiogram examinations.                  Arrythmias:PVC; Risk Factors:Diabetes, Dyslipidemia and                 Hypertension. CKD stage 3.  Sonographer:    Juanita Shaw Referring Phys: [id number] CAROLE N HALL  Sonographer Comments: Image acquisition challenging due to respiratory motion. IMPRESSIONS  1. Left ventricular ejection fraction, by estimation, is 35 to 40%. The left ventricle has moderately decreased function. Left ventricular endocardial border not optimally defined to evaluate regional wall motion. The left ventricular internal cavity size was mildly dilated. Left ventricular diastolic parameters are indeterminate.  2. Right ventricular systolic function is normal. The right ventricular size is normal. Tricuspid regurgitation signal is inadequate for assessing PA pressure.  3. The mitral valve is normal in structure. Trivial mitral valve regurgitation. No evidence of mitral stenosis.  4. The aortic valve is tricuspid. Aortic valve regurgitation is trivial. No aortic stenosis is present.  5. The inferior vena cava is normal in size with greater than 50% respiratory variability, suggesting right atrial pressure of 3 mmHg. Comparison(s): Patient did not have IV access for definity microbubble contrast. Images are technically difficult and estimation of left ventricular systolic function is of low confidence. Conclusion(s)/Recommendation(s): The study should be repeated with Definity contrast  or an alternative imaging modality should be used. FINDINGS  Left Ventricle: Left ventricular ejection fraction, by estimation, is 35 to 40%. The left ventricle has moderately decreased function. Left ventricular endocardial border not optimally defined to evaluate regional wall motion. The left ventricular internal cavity size was mildly dilated. There is no left ventricular hypertrophy. Left ventricular diastolic function could not be evaluated due to nondiagnostic images. Left ventricular diastolic parameters are indeterminate. Right Ventricle: The  right ventricular size is normal. No increase in right ventricular wall thickness. Right ventricular systolic function is normal. Tricuspid regurgitation signal is inadequate for assessing PA pressure. Left Atrium: Left atrial size was normal in size. Right Atrium: Right atrial size was normal in size. Pericardium: There is no evidence of pericardial effusion. Mitral Valve: The mitral valve is normal in structure. Trivial mitral valve regurgitation. No evidence of mitral valve stenosis. MV peak gradient, 2.3 mmHg. The mean mitral valve gradient is 1.0 mmHg. Tricuspid Valve: The tricuspid valve is normal in structure. Tricuspid valve regurgitation is not demonstrated. No evidence of tricuspid stenosis. Aortic Valve: The aortic valve is tricuspid. Aortic valve regurgitation is trivial. No aortic stenosis is present. Aortic valve mean gradient measures 2.0 mmHg. Aortic valve peak gradient measures 3.1 mmHg. Aortic valve area, by VTI measures 2.52 cm. Pulmonic Valve: The pulmonic valve was normal in structure. Pulmonic valve regurgitation is not visualized. No evidence of pulmonic stenosis. Aorta: The aortic root is normal in size and structure. Venous: The inferior vena cava is normal in size with greater than 50% respiratory variability, suggesting right atrial pressure of 3 mmHg. IAS/Shunts: No atrial level shunt detected by color flow Doppler.  LEFT VENTRICLE PLAX 2D LVIDd:         5.70 cm      Diastology LVIDs:         4.90 cm      LV e' medial:    7.40 cm/s LV PW:         1.20 cm      LV E/e' medial:  7.8 LV IVS:        0.90 cm      LV e' lateral:   12.10 cm/s LVOT diam:     2.30 cm      LV E/e' lateral: 4.8 LV SV:         34 LV SV Index:   17 LVOT Area:     4.15 cm  LV Volumes (MOD) LV vol d, MOD A2C: 139.0 ml LV vol d, MOD A4C: 210.0 ml LV vol s, MOD A2C: 60.9 ml LV vol s, MOD A4C: 112.0 ml LV SV MOD A2C:     78.1 ml LV SV MOD A4C:     210.0 ml LV SV MOD BP:      85.9 ml RIGHT VENTRICLE             IVC RV  Basal diam:  3.40 cm     IVC diam: 1.20 cm RV Mid diam:    2.80 cm RV S prime:     11.80 cm/s TAPSE (M-mode): 1.8 cm LEFT ATRIUM             Index        RIGHT ATRIUM           Index LA diam:        4.20 cm 2.12 cm/m   RA Area:     10.50 cm LA Vol (A2C):   72.5 ml 36.53 ml/m  RA  Volume:   21.20 ml  10.68 ml/m LA Vol (A4C):   35.5 ml 17.89 ml/m LA Biplane Vol: 51.2 ml 25.80 ml/m  AORTIC VALVE                    PULMONIC VALVE AV Area (Vmax):    2.85 cm     PV Vmax:       0.75 m/s AV Area (Vmean):   2.12 cm     PV Peak grad:  2.2 mmHg AV Area (VTI):     2.52 cm AV Vmax:           88.50 cm/s AV Vmean:          62.500 cm/s AV VTI:            0.136 m AV Peak Grad:      3.1 mmHg AV Mean Grad:      2.0 mmHg LVOT Vmax:         60.70 cm/s LVOT Vmean:        31.900 cm/s LVOT VTI:          0.083 m LVOT/AV VTI ratio: 0.61  AORTA Ao Root diam: 3.70 cm Ao Asc diam:  3.65 cm MITRAL VALVE MV Area (PHT): 4.06 cm    SHUNTS MV Area VTI:   2.38 cm    Systemic VTI:  0.08 m MV Peak grad:  2.3 mmHg    Systemic Diam: 2.30 cm MV Mean grad:  1.0 mmHg MV Vmax:       0.76 m/s MV Vmean:      44.9 cm/s MV Decel Time: 187 msec MV E velocity: 57.80 cm/s MV A velocity: 46.00 cm/s MV E/A ratio:  1.26 Mihai Croitoru MD Electronically signed by Luana Rumple MD Signature Date/Time: 04/06/2024/5:52:07 PM    Final    DG Swallowing Func-Speech Pathology Result Date: 04/06/2024 Table formatting from the original result was not included. Modified Barium Swallow Study Patient Details Name: Joel Webb MRN: [id number] Date of Birth: 11/12/1946 Today's Date: 04/06/2024 HPI/PMH: HPI: Pt is a 78 yo male presenting from ALF with frequent falls and choking episodes. MBS in 2023 revealed minimal oropharyngeal dysphagia with aspiration of thin liquids on sequential sips (PAS 7, sensed but not cleared by cough). PMH includes: PSP, GERD, MCI, HTN, HLD, CKD, DMII Clinical Impression: Pt has an oropharyngeal dysphagia with reduced coordination that  contributes to aspiration with thin and nectar thick liquids. His oral phase is marked by repetitive/disorganized lingual motion, disorganized chewing with small pieces of cracker not fully masticated before swallowing, and inconsistent oral clearance. Premature spillage was noted, as was piecemeal swallowing at times, making amount of oral residue quite variable, although ultimately he cleared his mouth well. The barium tablet could not be orally transited though and was manually removed. Reduced oral coordination resulted in inconsistent site of initiation, with liquids often spilling into his airway before airway closure. Aspiration of thin and nectar thick liquids is inconsistently sensed (PAS 7 and 8), and even when he does cough, he does not clear his airway. Coughing sometimes appears to be delayed, and sometimes he coughs without overt connection to aspiration, which could be indicative of further delayed sensation. Pharyngeally there is reduced soft palate elevation, anterior hyoid movement, laryngeal vestibule closure, epiglottic movement, and pharyngeal squeeze that contribute to both reduced safety and efficiency. A collection of residue remains primarily in the valleculae. Trace penetration, sometimes from this residue, occurs after the swallow with honey thick liquids. Compensatory  strategies tested include oral hold, chin tuck, and use of straw, but they did not significantly improve airway protection. Small, single sips (cued to "sip on hot coffee") resulted in better airway protection with thin liquids (penetration x1) but not with nectar thick liquids (PAS 7). Discussed findings with pt and spouse, who was present for the study. We discussed a more conservative approach, including honey thick liquids (with water protocol) as this did not result in aspiration even with larger volumes and self-pacing. An alternative option would be to remain on thin liquids, as this is his baseline diet and he  appears to have been tolerating it with no pulmonary issues despite signs of dysphagia for months. If on thin liquids, would consider a Provale cup to regulate bolus size until he can more consistently take small, single sips. Either option could include f/u SLP for swallowing therapy to optimize function. At this time, pt and his wife would like to remain on mechanical soft diet and thin liquids, with careful use of strategies to reduce but not eliminate aspiration risk. Factors that may increase risk of adverse event in presence of aspiration [name] & [name] 2021): Factors that may increase risk of adverse event in presence of aspiration [name] & [name] 2021): Reduced cognitive function; Limited mobility; Weak cough; Aspiration of thick, dense, and/or acidic materials Recommendations/Plan: Swallowing Evaluation Recommendations Swallowing Evaluation Recommendations Recommendations: PO diet PO Diet Recommendation: Dysphagia 3 (Mechanical soft); Thin liquids (Level 0) Liquid Administration via: Cup; Other (Comment) (provale cup) Medication Administration: Whole meds with puree Supervision: Patient able to self-feed; Full supervision/cueing for swallowing strategies Swallowing strategies  : Minimize environmental distractions; Slow rate; Small bites/sips (one small sip at a time, like you are "sipping on hot coffee" (can use Provale cup)) Postural changes: Position pt fully upright for meals Oral care recommendations: Oral care QID (4x/day) Treatment Plan Treatment Plan Treatment recommendations: Therapy as outlined in treatment plan below Follow-up recommendations: Skilled nursing-short term rehab (<3 hours/day) Functional status assessment: Patient has had a recent decline in their functional status and demonstrates the ability to make significant improvements in function in a reasonable and predictable amount of time. Treatment frequency: Min 2x/week Treatment duration: 2 weeks Interventions: Aspiration  precaution training; Compensatory techniques; Patient/family education; Trials of upgraded texture/liquids; Respiratory muscle strength training Recommendations Recommendations for follow up therapy are one component of a multi-disciplinary discharge planning process, led by the attending physician.  Recommendations may be updated based on patient status, additional functional criteria and insurance authorization. Assessment: Orofacial Exam: Orofacial Exam Oral Cavity: Oral Hygiene: WFL Oral Cavity - Dentition: Adequate natural dentition; Other (Comment) (partial) Orofacial Anatomy: WFL Anatomy: Anatomy: WFL Boluses Administered: Boluses Administered Boluses Administered: Thin liquids (Level 0); Mildly thick liquids (Level 2, nectar thick); Moderately thick liquids (Level 3, honey thick); Puree; Solid  Oral Impairment Domain: Oral Impairment Domain Lip Closure: No labial escape (as can be observed - limited view throughout the study) Tongue control during bolus hold: Posterior escape of less than half of bolus Bolus preparation/mastication: Disorganized chewing/mashing with solid pieces of bolus unchewed Bolus transport/lingual motion: Repetitive/disorganized tongue motion Oral residue: Majority of bolus remaining Location of oral residue : Floor of mouth; Tongue; Lateral sulci Initiation of pharyngeal swallow : Pyriform sinuses  Pharyngeal Impairment Domain: Pharyngeal Impairment Domain Soft palate elevation: Trace column of contrast or air between SP and PW Laryngeal elevation: Complete superior movement of thyroid  cartilage with complete approximation of arytenoids to epiglottic petiole Anterior hyoid excursion: Partial anterior movement Epiglottic movement:  Partial inversion Laryngeal vestibule closure: Incomplete, narrow column air/contrast in laryngeal vestibule Pharyngeal stripping wave : Present - diminished Pharyngeal contraction (A/P view only): N/A Pharyngoesophageal segment opening: Complete distension  and complete duration, no obstruction of flow Tongue base retraction: No contrast between tongue base and posterior pharyngeal wall (PPW) Pharyngeal residue: Collection of residue within or on pharyngeal structures Location of pharyngeal residue: Tongue base; Valleculae  Esophageal Impairment Domain: Esophageal Impairment Domain Esophageal clearance upright position: Complete clearance, esophageal coating Pill: Pill Consistency administered: Puree Puree: Impaired (see clinical impressions) Penetration/Aspiration Scale Score: Penetration/Aspiration Scale Score 1.  Material does not enter airway: Puree 3.  Material enters airway, remains ABOVE vocal cords and not ejected out: Solid 5.  Material enters airway, CONTACTS cords and not ejected out: Moderately thick liquids (Level 3, honey thick) 8.  Material enters airway, passes BELOW cords without attempt by patient to eject out (silent aspiration) : Thin liquids (Level 0); Mildly thick liquids (Level 2, nectar thick) Compensatory Strategies: Compensatory Strategies Compensatory strategies: Yes Straw: Ineffective Ineffective Straw: Thin liquid (Level 0) Chin tuck: Ineffective Ineffective Chin Tuck: Thin liquid (Level 0) Oral bolus hold: Ineffective Ineffective Oral Bolus Hold : Mildly thick liquid (Level 2, nectar thick)   General Information: Caregiver present: Yes (wife)  Diet Prior to this Study: Dysphagia 3 (mechanical soft); Other (Comment) (with sips of water)   Temperature : Normal   Respiratory Status: WFL   Supplemental O2: None (Room air)   History of Recent Intubation: No  Behavior/Cognition: Alert; Cooperative; Pleasant mood Self-Feeding Abilities: Able to self-feed Baseline vocal quality/speech: Normal (speech dysarthric) Volitional Cough: Able to elicit Volitional Swallow: Able to elicit Exam Limitations: No limitations Goal Planning: Prognosis for improved oropharyngeal function: Good No data recorded No data recorded Patient/Family Stated Goal: want his  swallowing assessed, wants him to get more help upon discharge Consulted and agree with results and recommendations: Patient; Family member/caregiver Pain: Pain Assessment Pain Assessment: Faces Faces Pain Scale: 0 End of Session: Start Time:SLP Start Time (ACUTE ONLY): 1257 Stop Time: SLP Stop Time (ACUTE ONLY): 1328 Time Calculation:SLP Time Calculation (min) (ACUTE ONLY): 31 min Charges: SLP Evaluations $ SLP Speech Visit: 1 Visit SLP Evaluations $BSS Swallow: 1 Procedure $MBS Swallow: 1 Procedure SLP visit diagnosis: SLP Visit Diagnosis: Dysphagia, unspecified (R13.10) Past Medical History: Past Medical History: Diagnosis Date  Chronic kidney disease, stage 3 01/11/2021  ED (erectile dysfunction)   Essential hypertension 06/05/2017  Frequent falls   Gait abnormality   Gastroesophageal reflux disease 01/11/2021  Major depressive disorder   Microalbuminuria due to type 2 diabetes mellitus 05/25/2020  Mild cognitive impairment of uncertain or unknown etiology   Mixed hyperlipidemia 02/28/2016  Multiple lacunar infarcts   Plantar fasciitis   Prostatitis   Pure hypercholesterolemia 01/11/2021  PVC's (premature ventricular contractions) 01/28/2022  Uncontrolled type 2 diabetes mellitus with hyperglycemia, with long-term current use of insulin  02/28/2016 Past Surgical History: Past Surgical History: Procedure Laterality Date  CIRCUMCISION    COLONOSCOPY    MOUTH SURGERY  01/2020 [name]., M.A. CCC-SLP Acute Rehabilitation Services Office: [phone] Secure chat preferred 04/06/2024, 2:36 PM  DG Chest 1 View Result Date: 04/05/2024 CLINICAL DATA:  Fall EXAM: CHEST  1 VIEW COMPARISON:  03/02/2024 FINDINGS: Borderline to mild cardiomegaly with small pleural effusions and vascular congestion similar compared to prior. Patchy atelectasis or infiltrate at the left base. No pneumothorax IMPRESSION: Borderline to mild cardiomegaly with small pleural effusions and vascular congestion. Patchy atelectasis or infiltrate at  the left base. Electronically  Signed   By: Esmeralda Hedge M.D.   On: 04/05/2024 22:10   CT Head Wo Contrast Result Date: 04/05/2024 CLINICAL DATA:  Multiple falls EXAM: CT HEAD WITHOUT CONTRAST CT CERVICAL SPINE WITHOUT CONTRAST TECHNIQUE: Multidetector CT imaging of the head and cervical spine was performed following the standard protocol without intravenous contrast. Multiplanar CT image reconstructions of the cervical spine were also generated. RADIATION DOSE REDUCTION: This exam was performed according to the departmental dose-optimization program which includes automated exposure control, adjustment of the mA and/or kV according to patient size and/or use of iterative reconstruction technique. COMPARISON:  03/02/2024 FINDINGS: CT HEAD FINDINGS Brain: No evidence of acute infarction, hemorrhage, hydrocephalus, extra-axial collection or mass lesion/mass effect. Periventricular white matter hypodensity. Lacunar infarction of the right lentiform nuclei. Vascular: No hyperdense vessel or unexpected calcification. Skull: Normal. Negative for fracture or focal lesion. Sinuses/Orbits: No acute finding. Other: None. CT CERVICAL SPINE FINDINGS Alignment: Normal. Skull base and vertebrae: No acute fracture. No primary bone lesion or focal pathologic process. Soft tissues and spinal canal: No prevertebral fluid or swelling. No visible canal hematoma. Disc levels: Focally moderate disc space height loss of C6-C7 with otherwise intact disc spaces. Upper chest: Negative. Other: None. IMPRESSION: 1. No acute intracranial pathology. Small-vessel white matter disease and old lacunar infarction of the right lentiform nuclei. 2. No fracture or static subluxation of the cervical spine. 3. Focally moderate disc space height loss of C6-C7 with otherwise intact disc spaces. Electronically Signed   By: Fredricka Jenny M.D.   On: 04/05/2024 21:58   CT Cervical Spine Wo Contrast Result Date: 04/05/2024 CLINICAL DATA:  Multiple falls  EXAM: CT HEAD WITHOUT CONTRAST CT CERVICAL SPINE WITHOUT CONTRAST TECHNIQUE: Multidetector CT imaging of the head and cervical spine was performed following the standard protocol without intravenous contrast. Multiplanar CT image reconstructions of the cervical spine were also generated. RADIATION DOSE REDUCTION: This exam was performed according to the departmental dose-optimization program which includes automated exposure control, adjustment of the mA and/or kV according to patient size and/or use of iterative reconstruction technique. COMPARISON:  03/02/2024 FINDINGS: CT HEAD FINDINGS Brain: No evidence of acute infarction, hemorrhage, hydrocephalus, extra-axial collection or mass lesion/mass effect. Periventricular white matter hypodensity. Lacunar infarction of the right lentiform nuclei. Vascular: No hyperdense vessel or unexpected calcification. Skull: Normal. Negative for fracture or focal lesion. Sinuses/Orbits: No acute finding. Other: None. CT CERVICAL SPINE FINDINGS Alignment: Normal. Skull base and vertebrae: No acute fracture. No primary bone lesion or focal pathologic process. Soft tissues and spinal canal: No prevertebral fluid or swelling. No visible canal hematoma. Disc levels: Focally moderate disc space height loss of C6-C7 with otherwise intact disc spaces. Upper chest: Negative. Other: None. IMPRESSION: 1. No acute intracranial pathology. Small-vessel white matter disease and old lacunar infarction of the right lentiform nuclei. 2. No fracture or static subluxation of the cervical spine. 3. Focally moderate disc space height loss of C6-C7 with otherwise intact disc spaces. Electronically Signed   By: Fredricka Jenny M.D.   On: 04/05/2024 21:58     Assessment and Plan:   New onset HFrEF Echocardiogram with EF 35 to 40% although study was not performed with contrast and is of low confidence, repeat echocardiogram was recommended.  Vascular congestion noted on echocardiogram, not started on  on diuresis.  Looks to be euvolemic though. He initially stated that would like to defer repeat echocardiogram and titration of GDMT outpatient.  We should verify reduced EF before aggressively titrating medications.  He  is agreeable now to having an IV placed and undergoing repeat limited echo with contrast. May consider ischemic evaluation if EF still depressed despite GDMT. Primary team had added Lopressor 12.5 mg twice daily, losartan 25 mg, spironolactone 12.5 mg.  I would touch base with patient to see if he is okay with these changes.  However Lopressor should be transition to Toprol-XL if wanted to place on BB but with hx of his falls would be careful with this and titration of GDMT. Also was on olmesartan  PTA. Agree with stopping amlodipine to allow further titration of GDMT Can continue p.o. Lasix  20 mg as needed.  Progressive supranuclear palsy He some cognitive deficits and has had frequent falls lately.  PVCs Prior monitor in April 2023 with less than 1% burden.  With this low burden beta-blocker not necessarily needed if asymptomatic.  Hyperlipidemia On atorvastatin 80 mg and Zetia  every day.   Risk Assessment/Risk Scores:     [place]  Heart Association (NYHA) Functional Class NYHA Class II   For questions or updates, please contact [facility] Please consult [email or web address] for contact info under    Signed, Burnetta Cart, PA-C  04/07/2024 1:54 PM   Patient seen and examined.  Agree with below documentation.  Joel Webb is a 78 year old male with history of hypertension, hyperlipidemia, CKD, diabetes, progressive supranuclear palsy who we are consulted by Dr. [place] for evaluation of heart failure.  He was admitted following a fall and choking episodes.  Echocardiogram showed new onset systolic heart failure with EF 35 to 40%, although poor quality study as patient did not have IV access and unable to give contrast.  He declined repeat study initially.  Vital  signs show BP 128/113, pulse 95, SpO2 95% on room air. Labs notable for creatinine 1.25, sodium 138, hemoglobin 14.9, WBC 8.0, BNP 879.  Head CT on admission showed no acute intracranial pathology.  Brain MRI also showed no acute abnormality.  Chest x-ray with vascular congestion and small pleural effusions.  EKG showed normal sinus rhythm, rate 82, incomplete right bundle branch block, poor R wave progression.  Echocardiogram showed EF 35 to 40%, poor endocardial visualization, mild LV dilatation, normal RV function, no significant valvular disease.  On exam, patient is alert and oriented, regular rate and rhythm, no murmurs, lungs CTAB, no LE edema or JVD, flat affect.  For his acute systolic heart failure, recommend repeating echocardiogram with contrast (he is now agreeable to this).  EF does appear reduced, though poor quality study.  Plan repeat echo.  In the meantime we will start GDMT.  He has been started on Lopressor 12.5 mg twice daily, losartan 25 mg daily, and spironolactone 12.5 mg daily, as well as Lasix  20 mg daily as needed.  Agree with current regimen, but would consolidate Lopressor to Toprol-XL 25 mg daily.    Wendie Hamburg, MD

## 2024-04-07 NOTE — Progress Notes (Signed)
 Patient has been educated numerous times today on use of call bell to alert staff he needs help but continues to get out of bed unassisted stating he doesn't need staff help. Tele camera ordered to assist with patient safety.

## 2024-04-08 ENCOUNTER — Observation Stay (HOSPITAL_BASED_OUTPATIENT_CLINIC_OR_DEPARTMENT_OTHER)

## 2024-04-08 ENCOUNTER — Encounter (HOSPITAL_BASED_OUTPATIENT_CLINIC_OR_DEPARTMENT_OTHER): Payer: Self-pay | Admitting: Cardiovascular Disease

## 2024-04-08 DIAGNOSIS — I5021 Acute systolic (congestive) heart failure: Secondary | ICD-10-CM | POA: Diagnosis not present

## 2024-04-08 DIAGNOSIS — I493 Ventricular premature depolarization: Secondary | ICD-10-CM | POA: Diagnosis not present

## 2024-04-08 DIAGNOSIS — R296 Repeated falls: Secondary | ICD-10-CM | POA: Diagnosis not present

## 2024-04-08 LAB — BASIC METABOLIC PANEL WITH GFR
Anion gap: 11 (ref 5–15)
BUN: 29 mg/dL — ABNORMAL HIGH (ref 8–23)
CO2: 20 mmol/L — ABNORMAL LOW (ref 22–32)
Calcium: 9.1 mg/dL (ref 8.9–10.3)
Chloride: 108 mmol/L (ref 98–111)
Creatinine, Ser: 1.36 mg/dL — ABNORMAL HIGH (ref 0.61–1.24)
GFR, Estimated: 54 mL/min — ABNORMAL LOW (ref >60)
Glucose, Bld: 144 mg/dL — ABNORMAL HIGH (ref 70–99)
Potassium: 4 mmol/L (ref 3.5–5.1)
Sodium: 139 mmol/L (ref 135–145)

## 2024-04-08 LAB — ECHOCARDIOGRAM LIMITED
AR max vel: 3.88 cm2
AV Area VTI: 2.95 cm2
AV Area mean vel: 3.44 cm2
AV Mean grad: 2 mmHg
AV Peak grad: 4.1 mmHg
Ao pk vel: 1.01 m/s
Area-P 1/2: 3.85 cm2
S' Lateral: 4.7 cm
Weight: 2758.4 [oz_av]

## 2024-04-08 LAB — BRAIN NATRIURETIC PEPTIDE: B Natriuretic Peptide: 1748.8 pg/mL — ABNORMAL HIGH (ref 0.0–100.0)

## 2024-04-08 LAB — MAGNESIUM: Magnesium: 1.9 mg/dL (ref 1.7–2.4)

## 2024-04-08 LAB — GLUCOSE, CAPILLARY
Glucose-Capillary: 137 mg/dL — ABNORMAL HIGH (ref 70–99)
Glucose-Capillary: 140 mg/dL — ABNORMAL HIGH (ref 70–99)
Glucose-Capillary: 181 mg/dL — ABNORMAL HIGH (ref 70–99)
Glucose-Capillary: 132 mg/dL — ABNORMAL HIGH (ref 70–99)

## 2024-04-08 MED ORDER — PERFLUTREN LIPID MICROSPHERE
1.0000 mL | INTRAVENOUS | Status: AC | PRN
  Administered 2024-04-08: 3 mL via INTRAVENOUS

## 2024-04-08 NOTE — Progress Notes (Signed)
 Mobility Specialist Progress Note:   04/08/24 1100  Mobility  Activity Ambulated with assistance in hallway  Level of Assistance Minimal assist, patient does 75% or more  Assistive Device Front wheel walker  Distance Ambulated (ft) 120 ft  Activity Response Tolerated well  Mobility Referral Yes  Mobility visit 1 Mobility  Mobility Specialist Start Time (ACUTE ONLY) 1042  Mobility Specialist Stop Time (ACUTE ONLY) 1054  Mobility Specialist Time Calculation (min) (ACUTE ONLY) 12 min   Pt received in bed and agreeable. Required minA to come EOB and stand. Required cues during ambulation d/t pt veering to R and having hard time avoiding obstacles. No complaints throughout and denied any dizziness. Pt left in bed with call bell and bed alarm on.  D'Vante Nolon Baxter Mobility Specialist Please contact via Special educational needs teacher or Rehab office at [phone]

## 2024-04-08 NOTE — Telephone Encounter (Signed)
 Spoke with wife who was concerned about patient getting cardiac cath.  Advised wife per Dr Lavern Potash note no patient declined cath.  Patient has progressive supranuclear palsy and she doesn't think good option for him She is concerned about Echo, advised not read yet. Also concerns with blood pressure medications I explained to her the [place] nor Dr Theodis Fiscal would likely go against something being done with medications secondary to physicians actually being able to assess patient  She would like to speak with the cardiologist who saw husband in hospital  Advised to speak with nurse and have them reach out to inpatient cardiology team to ask that she be called  Advised would forward message to Caitlin and Dr Theodis Fiscal but doubt any changes will be made based on review

## 2024-04-08 NOTE — Progress Notes (Signed)
 Rounding Note    Patient Name: Joel Webb Date of Encounter: 04/08/2024  [facility] Cardiologist: Maudine Sos, MD   Subjective   BP 138/107, Cr 1.36.  Denies chest pain. Reports shortness of breath.  Inpatient Medications    Scheduled Meds:  atorvastatin  80 mg Oral Daily   enoxaparin  (LOVENOX ) injection  40 mg Subcutaneous Daily   ezetimibe   10 mg Oral Daily   insulin  aspart  0-5 Units Subcutaneous QHS   insulin  aspart  0-9 Units Subcutaneous TID WC   losartan  25 mg Oral Daily   metoprolol succinate  25 mg Oral Daily   spironolactone  12.5 mg Oral Daily   Continuous Infusions:  PRN Meds: acetaminophen , melatonin, polyethylene glycol, prochlorperazine    Vital Signs    Vitals:   04/07/24 1623 04/07/24 2102 04/08/24 0500 04/08/24 0800  BP: (!) 143/82   (!) 138/107  Pulse: 82   89  Resp:  20  17  Temp:    (!) 97.4 F (36.3 C)  TempSrc:      SpO2:    92%  Weight:   78.2 kg     Intake/Output Summary (Last 24 hours) at 04/08/2024 0949 Last data filed at 04/07/2024 1236 Gross per 24 hour  Intake 180 ml  Output --  Net 180 ml      04/08/2024    5:00 AM 04/07/2024    4:25 AM 03/12/2024    2:39 PM  Last 3 Weights  Weight (lbs) 172 lb 6.4 oz 171 lb 15.3 oz 182 lb  Weight (kg) 78.2 kg 78 kg 82.555 kg      Telemetry    NSR - Personally Reviewed  ECG    No new ECG - Personally Reviewed  Physical Exam   GEN: No acute distress.   Neck: No JVD Cardiac: RRR, no murmurs, rubs, or gallops.  Respiratory: Clear to auscultation bilaterally. GI: Soft, nontender, non-distended  MS: No edema; No deformity. Neuro:  Nonfocal  Psych: Normal affect   Labs    High Sensitivity Troponin:  No results for input(s): "TROPONINIHS" in the last 720 hours.   Chemistry Recent Labs  Lab 04/05/24 2229 04/06/24 0433 04/07/24 0651 04/08/24 0544  NA 138 137 138 139  K 4.0 3.7 3.8 4.0  CL 107 109 109 108  CO2 22 22 20* 20*  GLUCOSE 176* 147* 165*  144*  BUN 25* 22 20 29*  CREATININE 1.46* 1.32* 1.25* 1.36*  CALCIUM 9.3 8.8* 8.7* 9.1  MG  --  2.0  --  1.9  PROT 6.5  --  6.0*  --   ALBUMIN 3.4*  --  3.1*  --   AST 16  --  14*  --   ALT 12  --  11  --   ALKPHOS 98  --  96  --   BILITOT 0.7  --  1.2  --   GFRNONAA 49* 56* 59* 54*  ANIONGAP 9 6 9 11     Lipids No results for input(s): "CHOL", "TRIG", "HDL", "LABVLDL", "LDLCALC", "CHOLHDL" in the last 168 hours.  Hematology Recent Labs  Lab 04/05/24 2229 04/06/24 0433 04/07/24 0651  WBC 8.4 8.8 8.0  RBC 4.89 5.10 5.14  HGB 14.4 14.6 14.9  HCT 42.6 44.6 44.2  MCV 87.1 87.5 86.0  MCH 29.4 28.6 29.0  MCHC 33.8 32.7 33.7  RDW 14.3 14.2 14.3  PLT 143* 143* 130*   Thyroid  No results for input(s): "TSH", "FREET4" in the last 168  hours.  BNP Recent Labs  Lab 04/05/24 2229 04/08/24 0544  BNP 878.5* 1,748.8*    DDimer No results for input(s): "DDIMER" in the last 168 hours.   Radiology    MR BRAIN WO CONTRAST Result Date: 04/06/2024 CLINICAL DATA:  Initial evaluation for mental status change, unknown cause. EXAM: MRI HEAD WITHOUT CONTRAST TECHNIQUE: Multiplanar, multiecho pulse sequences of the brain and surrounding structures were obtained without intravenous contrast. COMPARISON:  CT from 04/05/2024 FINDINGS: Brain: Mild age-related cerebral atrophy. Patchy T2/FLAIR hyperintensity involving the supratentorial cerebral white matter and pons, consistent with chronic small vessel ischemic disease, mild in nature. Remote lacunar infarct present at the right basal ganglia. No evidence for acute or subacute infarct. No acute or chronic intracranial blood products. No mass lesion, midline shift or mass effect. No hydrocephalus or extra-axial fluid collection. Pituitary gland within normal limits. Vascular: Major intracranial vascular flow voids are maintained. Skull and upper cervical spine: Craniocervical junction with normal limits. Bone marrow signal intensity normal. No scalp soft  tissue abnormality. Sinuses/Orbits: Globes orbital soft tissues within normal limits. Small right maxillary sinus retention cyst noted. Paranasal sinuses are otherwise clear. No mastoid effusion. Other: None. IMPRESSION: 1. No acute intracranial abnormality. 2. Mild age-related cerebral atrophy with chronic small vessel ischemic disease, with remote lacunar infarct at the right basal ganglia. Electronically Signed   By: Virgia Griffins M.D.   On: 04/06/2024 18:23   DG Swallowing Func-Speech Pathology Result Date: 04/06/2024 Table formatting from the original result was not included. Modified Barium Swallow Study Patient Details Name: Joel Webb MRN: [id number] Date of Birth: May 17, 1946 Today's Date: 04/06/2024 HPI/PMH: HPI: Pt is a 78 yo male presenting from ALF with frequent falls and choking episodes. MBS in 2023 revealed minimal oropharyngeal dysphagia with aspiration of thin liquids on sequential sips (PAS 7, sensed but not cleared by cough). PMH includes: PSP, GERD, MCI, HTN, HLD, CKD, DMII Clinical Impression: Pt has an oropharyngeal dysphagia with reduced coordination that contributes to aspiration with thin and nectar thick liquids. His oral phase is marked by repetitive/disorganized lingual motion, disorganized chewing with small pieces of cracker not fully masticated before swallowing, and inconsistent oral clearance. Premature spillage was noted, as was piecemeal swallowing at times, making amount of oral residue quite variable, although ultimately he cleared his mouth well. The barium tablet could not be orally transited though and was manually removed. Reduced oral coordination resulted in inconsistent site of initiation, with liquids often spilling into his airway before airway closure. Aspiration of thin and nectar thick liquids is inconsistently sensed (PAS 7 and 8), and even when he does cough, he does not clear his airway. Coughing sometimes appears to be delayed, and sometimes he  coughs without overt connection to aspiration, which could be indicative of further delayed sensation. Pharyngeally there is reduced soft palate elevation, anterior hyoid movement, laryngeal vestibule closure, epiglottic movement, and pharyngeal squeeze that contribute to both reduced safety and efficiency. A collection of residue remains primarily in the valleculae. Trace penetration, sometimes from this residue, occurs after the swallow with honey thick liquids. Compensatory strategies tested include oral hold, chin tuck, and use of straw, but they did not significantly improve airway protection. Small, single sips (cued to "sip on hot coffee") resulted in better airway protection with thin liquids (penetration x1) but not with nectar thick liquids (PAS 7). Discussed findings with pt and spouse, who was present for the study. We discussed a more conservative approach, including honey thick liquids (with water protocol)  as this did not result in aspiration even with larger volumes and self-pacing. An alternative option would be to remain on thin liquids, as this is his baseline diet and he appears to have been tolerating it with no pulmonary issues despite signs of dysphagia for months. If on thin liquids, would consider a Provale cup to regulate bolus size until he can more consistently take small, single sips. Either option could include f/u SLP for swallowing therapy to optimize function. At this time, pt and his wife would like to remain on mechanical soft diet and thin liquids, with careful use of strategies to reduce but not eliminate aspiration risk. Factors that may increase risk of adverse event in presence of aspiration [name] & [name] 2021): Factors that may increase risk of adverse event in presence of aspiration [name] & [name] 2021): Reduced cognitive function; Limited mobility; Weak cough; Aspiration of thick, dense, and/or acidic materials Recommendations/Plan: Swallowing Evaluation  Recommendations Swallowing Evaluation Recommendations Recommendations: PO diet PO Diet Recommendation: Dysphagia 3 (Mechanical soft); Thin liquids (Level 0) Liquid Administration via: Cup; Other (Comment) (provale cup) Medication Administration: Whole meds with puree Supervision: Patient able to self-feed; Full supervision/cueing for swallowing strategies Swallowing strategies  : Minimize environmental distractions; Slow rate; Small bites/sips (one small sip at a time, like you are "sipping on hot coffee" (can use Provale cup)) Postural changes: Position pt fully upright for meals Oral care recommendations: Oral care QID (4x/day) Treatment Plan Treatment Plan Treatment recommendations: Therapy as outlined in treatment plan below Follow-up recommendations: Skilled nursing-short term rehab (<3 hours/day) Functional status assessment: Patient has had a recent decline in their functional status and demonstrates the ability to make significant improvements in function in a reasonable and predictable amount of time. Treatment frequency: Min 2x/week Treatment duration: 2 weeks Interventions: Aspiration precaution training; Compensatory techniques; Patient/family education; Trials of upgraded texture/liquids; Respiratory muscle strength training Recommendations Recommendations for follow up therapy are one component of a multi-disciplinary discharge planning process, led by the attending physician.  Recommendations may be updated based on patient status, additional functional criteria and insurance authorization. Assessment: Orofacial Exam: Orofacial Exam Oral Cavity: Oral Hygiene: WFL Oral Cavity - Dentition: Adequate natural dentition; Other (Comment) (partial) Orofacial Anatomy: WFL Anatomy: Anatomy: WFL Boluses Administered: Boluses Administered Boluses Administered: Thin liquids (Level 0); Mildly thick liquids (Level 2, nectar thick); Moderately thick liquids (Level 3, honey thick); Puree; Solid  Oral Impairment  Domain: Oral Impairment Domain Lip Closure: No labial escape (as can be observed - limited view throughout the study) Tongue control during bolus hold: Posterior escape of less than half of bolus Bolus preparation/mastication: Disorganized chewing/mashing with solid pieces of bolus unchewed Bolus transport/lingual motion: Repetitive/disorganized tongue motion Oral residue: Majority of bolus remaining Location of oral residue : Floor of mouth; Tongue; Lateral sulci Initiation of pharyngeal swallow : Pyriform sinuses  Pharyngeal Impairment Domain: Pharyngeal Impairment Domain Soft palate elevation: Trace column of contrast or air between SP and PW Laryngeal elevation: Complete superior movement of thyroid  cartilage with complete approximation of arytenoids to epiglottic petiole Anterior hyoid excursion: Partial anterior movement Epiglottic movement: Partial inversion Laryngeal vestibule closure: Incomplete, narrow column air/contrast in laryngeal vestibule Pharyngeal stripping wave : Present - diminished Pharyngeal contraction (A/P view only): N/A Pharyngoesophageal segment opening: Complete distension and complete duration, no obstruction of flow Tongue base retraction: No contrast between tongue base and posterior pharyngeal wall (PPW) Pharyngeal residue: Collection of residue within or on pharyngeal structures Location of pharyngeal residue: Tongue base; Valleculae  Esophageal Impairment Domain:  Esophageal Impairment Domain Esophageal clearance upright position: Complete clearance, esophageal coating Pill: Pill Consistency administered: Puree Puree: Impaired (see clinical impressions) Penetration/Aspiration Scale Score: Penetration/Aspiration Scale Score 1.  Material does not enter airway: Puree 3.  Material enters airway, remains ABOVE vocal cords and not ejected out: Solid 5.  Material enters airway, CONTACTS cords and not ejected out: Moderately thick liquids (Level 3, honey thick) 8.  Material enters airway,  passes BELOW cords without attempt by patient to eject out (silent aspiration) : Thin liquids (Level 0); Mildly thick liquids (Level 2, nectar thick) Compensatory Strategies: Compensatory Strategies Compensatory strategies: Yes Straw: Ineffective Ineffective Straw: Thin liquid (Level 0) Chin tuck: Ineffective Ineffective Chin Tuck: Thin liquid (Level 0) Oral bolus hold: Ineffective Ineffective Oral Bolus Hold : Mildly thick liquid (Level 2, nectar thick)   General Information: Caregiver present: Yes (wife)  Diet Prior to this Study: Dysphagia 3 (mechanical soft); Other (Comment) (with sips of water)   Temperature : Normal   Respiratory Status: WFL   Supplemental O2: None (Room air)   History of Recent Intubation: No  Behavior/Cognition: Alert; Cooperative; Pleasant mood Self-Feeding Abilities: Able to self-feed Baseline vocal quality/speech: Normal (speech dysarthric) Volitional Cough: Able to elicit Volitional Swallow: Able to elicit Exam Limitations: No limitations Goal Planning: Prognosis for improved oropharyngeal function: Good No data recorded No data recorded Patient/Family Stated Goal: want his swallowing assessed, wants him to get more help upon discharge Consulted and agree with results and recommendations: Patient; Family member/caregiver Pain: Pain Assessment Pain Assessment: Faces Faces Pain Scale: 0 End of Session: Start Time:SLP Start Time (ACUTE ONLY): 1257 Stop Time: SLP Stop Time (ACUTE ONLY): 1328 Time Calculation:SLP Time Calculation (min) (ACUTE ONLY): 31 min Charges: SLP Evaluations $ SLP Speech Visit: 1 Visit SLP Evaluations $BSS Swallow: 1 Procedure $MBS Swallow: 1 Procedure SLP visit diagnosis: SLP Visit Diagnosis: Dysphagia, unspecified (R13.10) Past Medical History: Past Medical History: Diagnosis Date  Chronic kidney disease, stage 3 01/11/2021  ED (erectile dysfunction)   Essential hypertension 06/05/2017  Frequent falls   Gait abnormality   Gastroesophageal reflux disease 01/11/2021   Major depressive disorder   Microalbuminuria due to type 2 diabetes mellitus 05/25/2020  Mild cognitive impairment of uncertain or unknown etiology   Mixed hyperlipidemia 02/28/2016  Multiple lacunar infarcts   Plantar fasciitis   Prostatitis   Pure hypercholesterolemia 01/11/2021  PVC's (premature ventricular contractions) 01/28/2022  Uncontrolled type 2 diabetes mellitus with hyperglycemia, with long-term current use of insulin  02/28/2016 Past Surgical History: Past Surgical History: Procedure Laterality Date  CIRCUMCISION    COLONOSCOPY    MOUTH SURGERY  01/2020 [name]., M.A. CCC-SLP Acute Rehabilitation Services Office: [phone] Secure chat preferred 04/06/2024, 2:36 PM   Cardiac Studies     Patient Profile     78 y.o. male  with a hx of hypertension, hyperlipidemia, CKD 3, diabetes mellitus, GERD, progressive supernuclear palsy (affecting walking, balance, eye movements, swallowing, and cognitive deficits), frequent falls who is being seen 04/07/2024 for the evaluation of new heart failure   Assessment & Plan    New onset HFrEF Echocardiogram with EF 35 to 40% although study was not performed with contrast and is of low confidence, repeat echocardiogram was recommended.  Looks to be euvolemic though. Initially insistent on discharge yesterday, he ultimately was agreeable to staying and having IV placed and repeat echo with contrast today.  Repeat echo today confirms systolic dysfunction. Continue toprol XL 25 mg daily, losartan 25 mg, spironolactone 12.5 mg.  Agree with stopping amlodipine  to allow further titration of GDMT Can continue p.o. Lasix  20 mg as needed.  Recommend monitoring daily weights and take as needed if gains more than 3lbs in one day or 5 lbs in one week. Discussed ischemic evaluation but he declines, adamant about discharge today.  Can readdress as outpatient, though with his progressive supranuclear palsy likely not a great candidate for any invasive evaluation    Progressive supranuclear palsy He some cognitive deficits and has had frequent falls lately.   PVCs Prior monitor in April 2023 with less than 1% burden.  With this low burden beta-blocker not necessarily needed if asymptomatic.   Hyperlipidemia On atorvastatin 80 mg and Zetia  every day.   [facility] will sign off.   Medication Recommendations:  toprol XL 25 mg daily, losartan 25 mg, spironolactone 12.5 mg. Lasix  20 mg as needed, r ecommend monitoring daily weights and take as needed if gains more than 3lbs in one day or 5 lbs in one week. Other recommendations (labs, testing, etc):  BMET in 1 week Follow up as an outpatient:  F/u scheduled on 04/20/24   For questions or updates, please contact  [facility] Please consult [email or web address] for contact info under        Signed, Wendie Hamburg, MD  04/08/2024, 9:49 AM

## 2024-04-08 NOTE — Progress Notes (Signed)
 PROGRESS NOTE    Joel Webb  ZOX:[id number] DOB: 1946-04-07 DOA: 04/05/2024 PCP: Rae Bugler, MD    Brief Narrative:   Joel Webb is a 78 y.o. male with past medical history significant for HTN, HLD, CKD stage IIIb, DM2, progressive supranuclear palsy affecting ambulation/balance/swallowing with cognitive defects, GERD who presented to [facility] ED from [facility] with increased falls and choking episodes leading to poor oral intake.  No reported fever/chills.  Apparently at ALF, patient has been falling at least 2 times daily, at baseline utilizes a walker for ambulation.  Additionally having more episodes of dysphagia with worsening left-sided weakness for roughly 1 month.  In the ED, temperature 97.9 F, HR 83, RR 18, BP 137/82, SpO2 95% on room air.  WBC 8.4, hemoglobin 14.4, platelet count 143.  Sodium 138, potassium 4.0, chloride 107, CO2 22, glucose 176, BUN 25, creatinine 1.46.  AST 16, ALT 12, total Ruben 0.9.  BNP 878.5.  A1c 5.5.  Urinalysis with small hemoglobin, negative nitrite/leukocytes, no bacteria, 0-5 WBCs.  CT head/C-spine without contrast with no acute intracranial pathology, noted small vessel white matter disease and old lacunar infarction on the right limited informed nuclear, no fracture/subluxation of the C-spine, moderate disc space height loss at C6/7.  Chest x-ray with borderline/mild cardiomegaly with small pleural effusions and vascular congestion.  Due to unsafe discharge from the ED, EDP requested admission for further evaluation and management of dysphagia, falls and acute CHF.  TRH consulted for admission.  Assessment & Plan:   Frequent falls in the setting of progressive supranuclear palsy Follows with neurology outpatient. -- PT/OT recommend SNF; TOC following -- Fall precautions, continue therapy while inpatient   Acute systolic congestive heart failure  Presented with BNP elevated greater than 800, pulmonary edema, cardiomegaly on chest  x-ray, bilateral lower extremity edema.  No previous TTE available for review.  TTE with LVEF 35 to 40%, unable to evaluate LV regional wall motion, no aortic stenosis, IVC normal in size. -- Cardiology consulted -- Metoprolol succinate 25 mg p.o. daily -- Losartan 25 mg p.o. daily -- Spironolactone 12.5 mg p.o. daily -- Furosemide  20 mg PO daily PRN for weight gain 3 pounds 1 day or 5 pounds 1 week -- 1500 mL fluid restriction -- Strict I's and O's and daily weights -- Patient refuses any further testing/treatment inpatient, cardiology has outpatient appointment on 04/20/2024; although with patient's progressive supranuclear palsy likely not a great candidate for any invasive evaluation   Oropharyngeal dysphagia Patient seen by speech therapy, underwent MBS and cleared for dysphagia 3 diet. Dysphagia 3 (mechanical soft);Thin liquid Liquids provided via: Cup;No straw (thin liquids via Provale cup; water via regular cup) Medication Administration: Whole meds with puree Supervision: Patient able to self feed;Full supervision/cueing for compensatory strategies Compensations: Slow rate;Small sips/bites Postural Changes and/or Swallow Maneuvers: Seated upright 90 degrees -- Aspiration precautions   Hypertension Will discontinue home amlodipine. -- Metoprolol succinate 25 mg daily -- losartan 25 mg p.o. daily -- spironolactone 12.5 mg p.o. daily   Hyperlipidemia -- Atorvastatin 80 mg p.o. daily -- Zetia  10 g p.o. daily   Type 2 diabetes with hyperglycemia Hemoglobin A1c 5.5.  On Mounjaro and Novolin sliding scale outpatient. -- SSI for coverage -- CBG before every meal/at bedtime   DVT prophylaxis: enoxaparin  (LOVENOX ) injection 40 mg Start: 04/06/24 1000    Code Status: Full Code Family Communication: No family present at bedside this morning  Disposition Plan:  Level of care: Telemetry Medical Status is: Observation  The patient remains OBS appropriate and will d/c before 2  midnights.    Consultants:  Cardiology: Signed off 5/22  Procedures:  TTE  Antimicrobials:  none   Subjective: Patient seen examined bedside, lying in bed.  No complaints this morning other than mild shortness of breath when he ambulates.  Seen by cardiology in follow-up, patient declines any further invasive testing or treatment while inpatient and deferring to outpatient for further evaluation.  Cardiology has outpatient appointment set up.  Awaiting SNF placement.  Patient denies headache, no chest pain, no palpitations, no abdominal pain, no fever/chills, no nausea/vomiting/diarrhea, no paresthesias.  No acute events overnight per nursing staff.  Objective: Vitals:   04/07/24 1623 04/07/24 2102 04/08/24 0500 04/08/24 0800  BP: (!) 143/82   (!) 138/107  Pulse: 82   89  Resp:  20  17  Temp:    (!) 97.4 F (36.3 C)  TempSrc:      SpO2:    92%  Weight:   78.2 kg     Intake/Output Summary (Last 24 hours) at 04/08/2024 1125 Last data filed at 04/08/2024 0900 Gross per 24 hour  Intake 420 ml  Output --  Net 420 ml   Filed Weights   04/07/24 0425 04/08/24 0500  Weight: 78 kg 78.2 kg    Examination:  Physical Exam: GEN: NAD, alert and oriented x 3, elderly appearance HEENT: NCAT, PERRL, EOMI, sclera clear, MMM PULM: CTAB w/o wheezes/crackles, normal respiratory effort, on room air CV: RRR w/o M/G/R GI: abd soft, NTND, + BS MSK: no peripheral edema, moves all extremities independently NEURO: No focal no PSYCH: normal mood/affect    Data Reviewed: I have personally reviewed following labs and imaging studies  CBC: Recent Labs  Lab 04/05/24 2229 04/06/24 0433 04/07/24 0651  WBC 8.4 8.8 8.0  NEUTROABS 5.7  --   --   HGB 14.4 14.6 14.9  HCT 42.6 44.6 44.2  MCV 87.1 87.5 86.0  PLT 143* 143* 130*   Basic Metabolic Panel: Recent Labs  Lab 04/05/24 2229 04/06/24 0433 04/07/24 0651 04/08/24 0544  NA 138 137 138 139  K 4.0 3.7 3.8 4.0  CL 107 109 109 108   CO2 22 22 20* 20*  GLUCOSE 176* 147* 165* 144*  BUN 25* 22 20 29*  CREATININE 1.46* 1.32* 1.25* 1.36*  CALCIUM 9.3 8.8* 8.7* 9.1  MG  --  2.0  --  1.9  PHOS  --  3.5  --   --    GFR: Estimated Creatinine Clearance: 45.5 mL/min (A) (by C-G formula based on SCr of 1.36 mg/dL (H)). Liver Function Tests: Recent Labs  Lab 04/05/24 2229 04/07/24 0651  AST 16 14*  ALT 12 11  ALKPHOS 98 96  BILITOT 0.7 1.2  PROT 6.5 6.0*  ALBUMIN 3.4* 3.1*   No results for input(s): "LIPASE", "AMYLASE" in the last 168 hours. No results for input(s): "AMMONIA" in the last 168 hours. Coagulation Profile: No results for input(s): "INR", "PROTIME" in the last 168 hours. Cardiac Enzymes: No results for input(s): "CKTOTAL", "CKMB", "CKMBINDEX", "TROPONINI" in the last 168 hours. BNP (last 3 results) No results for input(s): "PROBNP" in the last 8760 hours. HbA1C: Recent Labs    04/05/24 2229  HGBA1C 5.5   CBG: Recent Labs  Lab 04/07/24 0809 04/07/24 1141 04/07/24 1621 04/07/24 2250 04/08/24 0628  GLUCAP 200* 133* 139* 117* 137*   Lipid Profile: No results for input(s): "CHOL", "HDL", "LDLCALC", "TRIG", "CHOLHDL", "LDLDIRECT" in the last  72 hours. Thyroid  Function Tests: No results for input(s): "TSH", "T4TOTAL", "FREET4", "T3FREE", "THYROIDAB" in the last 72 hours. Anemia Panel: No results for input(s): "VITAMINB12", "FOLATE", "FERRITIN", "TIBC", "IRON", "RETICCTPCT" in the last 72 hours. Sepsis Labs: No results for input(s): "PROCALCITON", "LATICACIDVEN" in the last 168 hours.  Recent Results (from the past 240 hours)  MRSA Next Gen by PCR, Nasal     Status: None   Collection Time: 04/06/24  6:46 AM   Specimen: Nasal Mucosa; Nasal Swab  Result Value Ref Range Status   MRSA by PCR Next Gen NOT DETECTED NOT DETECTED Final    Comment: (NOTE) The GeneXpert MRSA Assay (FDA approved for NASAL specimens only), is one component of a comprehensive MRSA colonization surveillance program. It  is not intended to diagnose MRSA infection nor to guide or monitor treatment for MRSA infections. Test performance is not FDA approved in patients less than 5 years old. Performed at [facility], [address], [place], [place] [address]          Radiology Studies: MR BRAIN WO CONTRAST Result Date: 04/06/2024 CLINICAL DATA:  Initial evaluation for mental status change, unknown cause. EXAM: MRI HEAD WITHOUT CONTRAST TECHNIQUE: Multiplanar, multiecho pulse sequences of the brain and surrounding structures were obtained without intravenous contrast. COMPARISON:  CT from 04/05/2024 FINDINGS: Brain: Mild age-related cerebral atrophy. Patchy T2/FLAIR hyperintensity involving the supratentorial cerebral white matter and pons, consistent with chronic small vessel ischemic disease, mild in nature. Remote lacunar infarct present at the right basal ganglia. No evidence for acute or subacute infarct. No acute or chronic intracranial blood products. No mass lesion, midline shift or mass effect. No hydrocephalus or extra-axial fluid collection. Pituitary gland within normal limits. Vascular: Major intracranial vascular flow voids are maintained. Skull and upper cervical spine: Craniocervical junction with normal limits. Bone marrow signal intensity normal. No scalp soft tissue abnormality. Sinuses/Orbits: Globes orbital soft tissues within normal limits. Small right maxillary sinus retention cyst noted. Paranasal sinuses are otherwise clear. No mastoid effusion. Other: None. IMPRESSION: 1. No acute intracranial abnormality. 2. Mild age-related cerebral atrophy with chronic small vessel ischemic disease, with remote lacunar infarct at the right basal ganglia. Electronically Signed   By: Virgia Griffins M.D.   On: 04/06/2024 18:23   DG Swallowing Func-Speech Pathology Result Date: 04/06/2024 Table formatting from the original result was not included. Modified Barium Swallow Study Patient Details Name:  RONIE BARNHART MRN: [id number] Date of Birth: 1946/08/31 Today's Date: 04/06/2024 HPI/PMH: HPI: Pt is a 78 yo male presenting from ALF with frequent falls and choking episodes. MBS in 2023 revealed minimal oropharyngeal dysphagia with aspiration of thin liquids on sequential sips (PAS 7, sensed but not cleared by cough). PMH includes: PSP, GERD, MCI, HTN, HLD, CKD, DMII Clinical Impression: Pt has an oropharyngeal dysphagia with reduced coordination that contributes to aspiration with thin and nectar thick liquids. His oral phase is marked by repetitive/disorganized lingual motion, disorganized chewing with small pieces of cracker not fully masticated before swallowing, and inconsistent oral clearance. Premature spillage was noted, as was piecemeal swallowing at times, making amount of oral residue quite variable, although ultimately he cleared his mouth well. The barium tablet could not be orally transited though and was manually removed. Reduced oral coordination resulted in inconsistent site of initiation, with liquids often spilling into his airway before airway closure. Aspiration of thin and nectar thick liquids is inconsistently sensed (PAS 7 and 8), and even when he does cough, he does not  clear his airway. Coughing sometimes appears to be delayed, and sometimes he coughs without overt connection to aspiration, which could be indicative of further delayed sensation. Pharyngeally there is reduced soft palate elevation, anterior hyoid movement, laryngeal vestibule closure, epiglottic movement, and pharyngeal squeeze that contribute to both reduced safety and efficiency. A collection of residue remains primarily in the valleculae. Trace penetration, sometimes from this residue, occurs after the swallow with honey thick liquids. Compensatory strategies tested include oral hold, chin tuck, and use of straw, but they did not significantly improve airway protection. Small, single sips (cued to "sip on hot coffee")  resulted in better airway protection with thin liquids (penetration x1) but not with nectar thick liquids (PAS 7). Discussed findings with pt and spouse, who was present for the study. We discussed a more conservative approach, including honey thick liquids (with water protocol) as this did not result in aspiration even with larger volumes and self-pacing. An alternative option would be to remain on thin liquids, as this is his baseline diet and he appears to have been tolerating it with no pulmonary issues despite signs of dysphagia for months. If on thin liquids, would consider a Provale cup to regulate bolus size until he can more consistently take small, single sips. Either option could include f/u SLP for swallowing therapy to optimize function. At this time, pt and his wife would like to remain on mechanical soft diet and thin liquids, with careful use of strategies to reduce but not eliminate aspiration risk. Factors that may increase risk of adverse event in presence of aspiration [name] & [name] 2021): Factors that may increase risk of adverse event in presence of aspiration [name] & [name] 2021): Reduced cognitive function; Limited mobility; Weak cough; Aspiration of thick, dense, and/or acidic materials Recommendations/Plan: Swallowing Evaluation Recommendations Swallowing Evaluation Recommendations Recommendations: PO diet PO Diet Recommendation: Dysphagia 3 (Mechanical soft); Thin liquids (Level 0) Liquid Administration via: Cup; Other (Comment) (provale cup) Medication Administration: Whole meds with puree Supervision: Patient able to self-feed; Full supervision/cueing for swallowing strategies Swallowing strategies  : Minimize environmental distractions; Slow rate; Small bites/sips (one small sip at a time, like you are "sipping on hot coffee" (can use Provale cup)) Postural changes: Position pt fully upright for meals Oral care recommendations: Oral care QID (4x/day) Treatment Plan Treatment Plan  Treatment recommendations: Therapy as outlined in treatment plan below Follow-up recommendations: Skilled nursing-short term rehab (<3 hours/day) Functional status assessment: Patient has had a recent decline in their functional status and demonstrates the ability to make significant improvements in function in a reasonable and predictable amount of time. Treatment frequency: Min 2x/week Treatment duration: 2 weeks Interventions: Aspiration precaution training; Compensatory techniques; Patient/family education; Trials of upgraded texture/liquids; Respiratory muscle strength training Recommendations Recommendations for follow up therapy are one component of a multi-disciplinary discharge planning process, led by the attending physician.  Recommendations may be updated based on patient status, additional functional criteria and insurance authorization. Assessment: Orofacial Exam: Orofacial Exam Oral Cavity: Oral Hygiene: WFL Oral Cavity - Dentition: Adequate natural dentition; Other (Comment) (partial) Orofacial Anatomy: WFL Anatomy: Anatomy: WFL Boluses Administered: Boluses Administered Boluses Administered: Thin liquids (Level 0); Mildly thick liquids (Level 2, nectar thick); Moderately thick liquids (Level 3, honey thick); Puree; Solid  Oral Impairment Domain: Oral Impairment Domain Lip Closure: No labial escape (as can be observed - limited view throughout the study) Tongue control during bolus hold: Posterior escape of less than half of bolus Bolus preparation/mastication: Disorganized chewing/mashing with solid pieces of  bolus unchewed Bolus transport/lingual motion: Repetitive/disorganized tongue motion Oral residue: Majority of bolus remaining Location of oral residue : Floor of mouth; Tongue; Lateral sulci Initiation of pharyngeal swallow : Pyriform sinuses  Pharyngeal Impairment Domain: Pharyngeal Impairment Domain Soft palate elevation: Trace column of contrast or air between SP and PW Laryngeal elevation:  Complete superior movement of thyroid  cartilage with complete approximation of arytenoids to epiglottic petiole Anterior hyoid excursion: Partial anterior movement Epiglottic movement: Partial inversion Laryngeal vestibule closure: Incomplete, narrow column air/contrast in laryngeal vestibule Pharyngeal stripping wave : Present - diminished Pharyngeal contraction (A/P view only): N/A Pharyngoesophageal segment opening: Complete distension and complete duration, no obstruction of flow Tongue base retraction: No contrast between tongue base and posterior pharyngeal wall (PPW) Pharyngeal residue: Collection of residue within or on pharyngeal structures Location of pharyngeal residue: Tongue base; Valleculae  Esophageal Impairment Domain: Esophageal Impairment Domain Esophageal clearance upright position: Complete clearance, esophageal coating Pill: Pill Consistency administered: Puree Puree: Impaired (see clinical impressions) Penetration/Aspiration Scale Score: Penetration/Aspiration Scale Score 1.  Material does not enter airway: Puree 3.  Material enters airway, remains ABOVE vocal cords and not ejected out: Solid 5.  Material enters airway, CONTACTS cords and not ejected out: Moderately thick liquids (Level 3, honey thick) 8.  Material enters airway, passes BELOW cords without attempt by patient to eject out (silent aspiration) : Thin liquids (Level 0); Mildly thick liquids (Level 2, nectar thick) Compensatory Strategies: Compensatory Strategies Compensatory strategies: Yes Straw: Ineffective Ineffective Straw: Thin liquid (Level 0) Chin tuck: Ineffective Ineffective Chin Tuck: Thin liquid (Level 0) Oral bolus hold: Ineffective Ineffective Oral Bolus Hold : Mildly thick liquid (Level 2, nectar thick)   General Information: Caregiver present: Yes (wife)  Diet Prior to this Study: Dysphagia 3 (mechanical soft); Other (Comment) (with sips of water)   Temperature : Normal   Respiratory Status: WFL   Supplemental O2:  None (Room air)   History of Recent Intubation: No  Behavior/Cognition: Alert; Cooperative; Pleasant mood Self-Feeding Abilities: Able to self-feed Baseline vocal quality/speech: Normal (speech dysarthric) Volitional Cough: Able to elicit Volitional Swallow: Able to elicit Exam Limitations: No limitations Goal Planning: Prognosis for improved oropharyngeal function: Good No data recorded No data recorded Patient/Family Stated Goal: want his swallowing assessed, wants him to get more help upon discharge Consulted and agree with results and recommendations: Patient; Family member/caregiver Pain: Pain Assessment Pain Assessment: Faces Faces Pain Scale: 0 End of Session: Start Time:SLP Start Time (ACUTE ONLY): 1257 Stop Time: SLP Stop Time (ACUTE ONLY): 1328 Time Calculation:SLP Time Calculation (min) (ACUTE ONLY): 31 min Charges: SLP Evaluations $ SLP Speech Visit: 1 Visit SLP Evaluations $BSS Swallow: 1 Procedure $MBS Swallow: 1 Procedure SLP visit diagnosis: SLP Visit Diagnosis: Dysphagia, unspecified (R13.10) Past Medical History: Past Medical History: Diagnosis Date  Chronic kidney disease, stage 3 01/11/2021  ED (erectile dysfunction)   Essential hypertension 06/05/2017  Frequent falls   Gait abnormality   Gastroesophageal reflux disease 01/11/2021  Major depressive disorder   Microalbuminuria due to type 2 diabetes mellitus 05/25/2020  Mild cognitive impairment of uncertain or unknown etiology   Mixed hyperlipidemia 02/28/2016  Multiple lacunar infarcts   Plantar fasciitis   Prostatitis   Pure hypercholesterolemia 01/11/2021  PVC's (premature ventricular contractions) 01/28/2022  Uncontrolled type 2 diabetes mellitus with hyperglycemia, with long-term current use of insulin  02/28/2016 Past Surgical History: Past Surgical History: Procedure Laterality Date  CIRCUMCISION    COLONOSCOPY    MOUTH SURGERY  01/2020 [name]., M.A. CCC-SLP Acute Rehabilitation Services  Office: [phone] Secure chat preferred  04/06/2024, 2:36 PM       Scheduled Meds:  atorvastatin  80 mg Oral Daily   enoxaparin  (LOVENOX ) injection  40 mg Subcutaneous Daily   ezetimibe   10 mg Oral Daily   insulin  aspart  0-5 Units Subcutaneous QHS   insulin  aspart  0-9 Units Subcutaneous TID WC   losartan  25 mg Oral Daily   metoprolol succinate  25 mg Oral Daily   spironolactone  12.5 mg Oral Daily   Continuous Infusions:   LOS: 0 days    Time spent: 45 minutes spent on 04/08/2024 caring for this patient face-to-face including chart review, ordering labs/tests, documenting, discussion with nursing staff, consultants, updating family and interview/physical exam    Rema Care [place], DO [facility] Available via Epic secure chat 7am-7pm After these hours, please refer to coverage provider listed on [email or web address] 04/08/2024, 11:25 AM

## 2024-04-08 NOTE — Progress Notes (Signed)
 Speech Language Pathology Treatment: Dysphagia  Patient Details Name: Joel Webb MRN: [id number] DOB: 1946/01/09 Today's Date: 04/08/2024 Time: 4098-1191 SLP Time Calculation (min) (ACUTE ONLY): 32 min  Assessment / Plan / Recommendation Clinical Impression  SLP at bedside. Reviewed use of Provale cup, which pt is using. Provided EMST and pt completed x20+ repetitions with setting varied at 5-15 resistance level. Encouraged pt to complete regular EMST exercises throughout the day. Reviewed and demonstrated effortful swallow exercise and encouraged pt to complete this exercise x5 during meal times. Coughing incident following x1 out of 5+ trials of water from Provale cup. Productive cough with phlegm expectoration observed in absence of PO intake. SLP set up suction with Yankauer and demonstrated how to use.   Recommend continue current SLP PoC.   HPI HPI: Pt is a 78 yo male presenting from ALF with frequent falls and choking episodes. MBS in 2023 revealed minimal oropharyngeal dysphagia with aspiration of thin liquids on sequential sips (PAS 7, sensed but not cleared by cough). PMH includes: PSP, GERD, MCI, HTN, HLD, CKD, DMII      SLP Plan  Continue with current plan of care      Recommendations for follow up therapy are one component of a multi-disciplinary discharge planning process, led by the attending physician.  Recommendations may be updated based on patient status, additional functional criteria and insurance authorization.    Recommendations  Diet recommendations: Dysphagia 3 (mechanical soft);Thin liquid Liquids provided via: Cup;No straw (Provale cup) Medication Administration: Whole meds with puree Supervision: Patient able to self feed;Full supervision/cueing for compensatory strategies Compensations: Slow rate;Small sips/bites Postural Changes and/or Swallow Maneuvers: Seated upright 90 degrees                  Oral care QID     Dysphagia, oropharyngeal  phase (R13.12)     Continue with current plan of care     Joel Webb  04/08/2024, 10:50 AM

## 2024-04-08 NOTE — Plan of Care (Signed)
  Problem: Coping: Goal: Level of anxiety will decrease Outcome: Progressing   Problem: Elimination: Goal: Will not experience complications related to urinary retention Outcome: Progressing   Problem: Safety: Goal: Ability to remain free from injury will improve Outcome: Progressing   Problem: Skin Integrity: Goal: Risk for impaired skin integrity will decrease Outcome: Progressing   

## 2024-04-08 NOTE — Telephone Encounter (Signed)
 Patient's wife is requesting to speak with the nurse in regard to the patient being at the hospital. Patient's wife would like Dr. Theodis Fiscal and/or NP Neomi Banks to review the hospital notes and test. Please advise.

## 2024-04-08 NOTE — TOC Progression Note (Signed)
 Transition of Care [facility]) - Progression Note    Patient Details  Name: GEOVONNI MEYERHOFF MRN: [id number] Date of Birth: [birth year]  Transition of Care [facility]) CM/SW Contact  Elspeth Hals, LCSW Phone Number: 04/08/2024, 9:54 AM  Clinical Narrative:   PASSR received: [phone]    Expected Discharge Plan: Skilled Nursing Facility Barriers to Discharge: Other (must enter comment) (insurance auth)  Expected Discharge Plan and Services In-house Referral: Clinical Social Work   Post Acute Care Choice: Skilled Nursing Facility Living arrangements for the past 2 months: Assisted Living Facility ([facility])                                       Social Determinants of Health (SDOH) Interventions SDOH Screenings   Food Insecurity: No Food Insecurity (04/06/2024)  Housing: Low Risk  (04/06/2024)  Transportation Needs: No Transportation Needs (04/06/2024)  Utilities: Not At Risk (04/06/2024)  Depression (PHQ2-9): Low Risk  (07/10/2020)  Social Connections: Socially Isolated (04/07/2024)  Tobacco Use: Medium Risk (04/05/2024)    Readmission Risk Interventions     No data to display

## 2024-04-09 DIAGNOSIS — Z7189 Other specified counseling: Secondary | ICD-10-CM | POA: Diagnosis not present

## 2024-04-09 DIAGNOSIS — Z515 Encounter for palliative care: Secondary | ICD-10-CM

## 2024-04-09 DIAGNOSIS — R296 Repeated falls: Secondary | ICD-10-CM | POA: Diagnosis not present

## 2024-04-09 LAB — BASIC METABOLIC PANEL WITH GFR
Anion gap: 10 (ref 5–15)
BUN: 32 mg/dL — ABNORMAL HIGH (ref 8–23)
CO2: 19 mmol/L — ABNORMAL LOW (ref 22–32)
Calcium: 8.8 mg/dL — ABNORMAL LOW (ref 8.9–10.3)
Chloride: 110 mmol/L (ref 98–111)
Creatinine, Ser: 1.28 mg/dL — ABNORMAL HIGH (ref 0.61–1.24)
GFR, Estimated: 58 mL/min — ABNORMAL LOW (ref >60)
Glucose, Bld: 153 mg/dL — ABNORMAL HIGH (ref 70–99)
Potassium: 4.1 mmol/L (ref 3.5–5.1)
Sodium: 139 mmol/L (ref 135–145)

## 2024-04-09 LAB — GLUCOSE, CAPILLARY
Glucose-Capillary: 136 mg/dL — ABNORMAL HIGH (ref 70–99)
Glucose-Capillary: 208 mg/dL — ABNORMAL HIGH (ref 70–99)
Glucose-Capillary: 110 mg/dL — ABNORMAL HIGH (ref 70–99)
Glucose-Capillary: 167 mg/dL — ABNORMAL HIGH (ref 70–99)

## 2024-04-09 NOTE — Progress Notes (Signed)
 PROGRESS NOTE    Joel Webb  LKG:[id number] DOB: [birth year] DOA: 04/05/2024 PCP: Rae Bugler, MD    Brief Narrative:   Joel Webb is a 78 y.o. male with past medical history significant for HTN, HLD, CKD stage IIIb, DM2, progressive supranuclear palsy affecting ambulation/balance/swallowing with cognitive defects, GERD who presented to [facility] ED from [facility] with increased falls and choking episodes leading to poor oral intake.  No reported fever/chills.  Apparently at ALF, patient has been falling at least 2 times daily, at baseline utilizes a walker for ambulation.  Additionally having more episodes of dysphagia with worsening left-sided weakness for roughly 1 month.  In the ED, temperature 97.9 F, HR 83, RR 18, BP 137/82, SpO2 95% on room air.  WBC 8.4, hemoglobin 14.4, platelet count 143.  Sodium 138, potassium 4.0, chloride 107, CO2 22, glucose 176, BUN 25, creatinine 1.46.  AST 16, ALT 12, total Ruben 0.9.  BNP 878.5.  A1c 5.5.  Urinalysis with small hemoglobin, negative nitrite/leukocytes, no bacteria, 0-5 WBCs.  CT head/C-spine without contrast with no acute intracranial pathology, noted small vessel white matter disease and old lacunar infarction on the right limited informed nuclear, no fracture/subluxation of the C-spine, moderate disc space height loss at C6/7.  Chest x-ray with borderline/mild cardiomegaly with small pleural effusions and vascular congestion.  Due to unsafe discharge from the ED, EDP requested admission for further evaluation and management of dysphagia, falls and acute CHF.  TRH consulted for admission.  Assessment & Plan:   Frequent falls in the setting of progressive supranuclear palsy Follows with neurology outpatient. -- PT/OT recommend SNF; TOC following -- Fall precautions, continue therapy while inpatient -- Palliative care consult for assistance with goals of care/Medical Decision Making given overall poor prognosis with continued  decline   Acute systolic congestive heart failure  Presented with BNP elevated greater than 800, pulmonary edema, cardiomegaly on chest x-ray, bilateral lower extremity edema.  No previous TTE available for review.  TTE with LVEF 35 to 40%, unable to evaluate LV regional wall motion, no aortic stenosis, IVC normal in size. -- Cardiology consulted -- Metoprolol succinate 25 mg p.o. daily -- Losartan 25 mg p.o. daily -- Spironolactone 12.5 mg p.o. daily -- Furosemide  20 mg PO daily PRN for weight gain 3 pounds 1 day or 5 pounds 1 week -- 1500 mL fluid restriction -- Strict I's and O's and daily weights -- Patient refuses any further testing/treatment inpatient, cardiology has outpatient appointment on 04/20/2024; although with patient's progressive supranuclear palsy likely not a great candidate for any invasive evaluation   Oropharyngeal dysphagia Patient seen by speech therapy, underwent MBS and cleared for dysphagia 3 diet. Dysphagia 3 (mechanical soft);Thin liquid Liquids provided via: Cup;No straw (thin liquids via Provale cup; water via regular cup) Medication Administration: Whole meds with puree Supervision: Patient able to self feed;Full supervision/cueing for compensatory strategies Compensations: Slow rate;Small sips/bites Postural Changes and/or Swallow Maneuvers: Seated upright 90 degrees -- Aspiration precautions   Hypertension Will discontinue home amlodipine. -- Metoprolol succinate 25 mg daily -- Losartan 25 mg p.o. daily -- spironolactone 12.5 mg p.o. daily   Hyperlipidemia -- Atorvastatin 80 mg p.o. daily -- Zetia  10 g p.o. daily   Type 2 diabetes with hyperglycemia Hemoglobin A1c 5.5.  On Mounjaro and Novolin sliding scale outpatient. -- SSI for coverage -- CBG before every meal/at bedtime  Weakness/debility/deconditioning/gait disturbance: PT/OT recommend SNF placement; patient's insurance carrier requested peer to peer which was completed on 04/09/2024, with  ultimate denial by insurance carrier.   DVT prophylaxis: enoxaparin  (LOVENOX ) injection 40 mg Start: 04/06/24 1000    Code Status: Full Code Family Communication: No family present at bedside this morning  Disposition Plan:  Level of care: Telemetry Medical Status is: Observation The patient remains OBS appropriate and will d/c before 2 midnights.    Consultants:  Cardiology: Signed off 5/22 Palliative care consulted for assistance goals of care/medical decision making  Procedures:  TTE  Antimicrobials:  none   Subjective: Patient seen examined bedside, lying in bed.  No complaints this morning.  Peer to peer performed today with patient's insurance carrier, denied SNF authorization.  Cardiology continues to desire outpatient follow-up with no further invasive testing at this time.  Patient denies headache, no chest pain, no palpitations, no abdominal pain, no fever/chills, no nausea/vomiting/diarrhea, no paresthesias.  No acute events overnight per nursing staff.  Objective: Vitals:   04/08/24 1552 04/08/24 2056 04/09/24 0435 04/09/24 0851  BP:  (!) 148/99 133/86 130/80  Pulse:  99 90 78  Resp:  18 16 18   Temp:  97.8 F (36.6 C) 97.6 F (36.4 C) 98.2 F (36.8 C)  TempSrc:    Oral  SpO2:  92% 92% 95%  Weight:   75.5 kg   Height: 5\' 9"  (1.753 m)       Intake/Output Summary (Last 24 hours) at 04/09/2024 1252 Last data filed at 04/08/2024 1500 Gross per 24 hour  Intake 240 ml  Output --  Net 240 ml   Filed Weights   04/07/24 0425 04/08/24 0500 04/09/24 0435  Weight: 78 kg 78.2 kg 75.5 kg    Examination:  Physical Exam: GEN: NAD, alert and oriented x 3, elderly appearance HEENT: NCAT, PERRL, EOMI, sclera clear, MMM PULM: CTAB w/o wheezes/crackles, normal respiratory effort, on room air CV: RRR w/o M/G/R GI: abd soft, NTND, + BS MSK: no peripheral edema, moves all extremities independently NEURO: No focal no PSYCH: normal mood/affect    Data Reviewed: I  have personally reviewed following labs and imaging studies  CBC: Recent Labs  Lab 04/05/24 2229 04/06/24 0433 04/07/24 0651  WBC 8.4 8.8 8.0  NEUTROABS 5.7  --   --   HGB 14.4 14.6 14.9  HCT 42.6 44.6 44.2  MCV 87.1 87.5 86.0  PLT 143* 143* 130*   Basic Metabolic Panel: Recent Labs  Lab 04/05/24 2229 04/06/24 0433 04/07/24 0651 04/08/24 0544 04/09/24 0805  NA 138 137 138 139 139  K 4.0 3.7 3.8 4.0 4.1  CL 107 109 109 108 110  CO2 22 22 20* 20* 19*  GLUCOSE 176* 147* 165* 144* 153*  BUN 25* 22 20 29* 32*  CREATININE 1.46* 1.32* 1.25* 1.36* 1.28*  CALCIUM 9.3 8.8* 8.7* 9.1 8.8*  MG  --  2.0  --  1.9  --   PHOS  --  3.5  --   --   --    GFR: Estimated Creatinine Clearance: 48.3 mL/min (A) (by C-G formula based on SCr of 1.28 mg/dL (H)). Liver Function Tests: Recent Labs  Lab 04/05/24 2229 04/07/24 0651  AST 16 14*  ALT 12 11  ALKPHOS 98 96  BILITOT 0.7 1.2  PROT 6.5 6.0*  ALBUMIN 3.4* 3.1*   No results for input(s): "LIPASE", "AMYLASE" in the last 168 hours. No results for input(s): "AMMONIA" in the last 168 hours. Coagulation Profile: No results for input(s): "INR", "PROTIME" in the last 168 hours. Cardiac Enzymes: No results for input(s): "CKTOTAL", "CKMB", "CKMBINDEX", "  TROPONINI" in the last 168 hours. BNP (last 3 results) No results for input(s): "PROBNP" in the last 8760 hours. HbA1C: No results for input(s): "HGBA1C" in the last 72 hours.  CBG: Recent Labs  Lab 04/08/24 1132 04/08/24 1605 04/08/24 2058 04/09/24 0601 04/09/24 1128  GLUCAP 140* 181* 132* 136* 208*   Lipid Profile: No results for input(s): "CHOL", "HDL", "LDLCALC", "TRIG", "CHOLHDL", "LDLDIRECT" in the last 72 hours. Thyroid  Function Tests: No results for input(s): "TSH", "T4TOTAL", "FREET4", "T3FREE", "THYROIDAB" in the last 72 hours. Anemia Panel: No results for input(s): "VITAMINB12", "FOLATE", "FERRITIN", "TIBC", "IRON", "RETICCTPCT" in the last 72 hours. Sepsis  Labs: No results for input(s): "PROCALCITON", "LATICACIDVEN" in the last 168 hours.  Recent Results (from the past 240 hours)  MRSA Next Gen by PCR, Nasal     Status: None   Collection Time: 04/06/24  6:46 AM   Specimen: Nasal Mucosa; Nasal Swab  Result Value Ref Range Status   MRSA by PCR Next Gen NOT DETECTED NOT DETECTED Final    Comment: (NOTE) The GeneXpert MRSA Assay (FDA approved for NASAL specimens only), is one component of a comprehensive MRSA colonization surveillance program. It is not intended to diagnose MRSA infection nor to guide or monitor treatment for MRSA infections. Test performance is not FDA approved in patients less than 19 years old. Performed at [facility], [address] [place], [place] [address]          Radiology Studies: ECHOCARDIOGRAM LIMITED Result Date: 04/08/2024    ECHOCARDIOGRAM LIMITED REPORT   Patient Name:   Joel Webb Date of Exam: 04/08/2024 Medical Rec #:  [id number]        Height:       69.0 in Accession #:    [id number]       Weight:       172.4 lb Date of Birth:  October 06, 1946       BSA:          1.940 m Patient Age:    77 years         BP:           138/107 mmHg Patient Gender: M                HR:           86 bpm. Exam Location:  Inpatient Procedure: 2D Echo, Limited Echo, Cardiac Doppler, Color Doppler and            Intracardiac Opacification Agent (Both Spectral and Color Flow            Doppler were utilized during procedure). Indications:    CHF  History:        Patient has prior history of Echocardiogram examinations, most                 recent 04/06/2024. Risk Factors:Hypertension.  Sonographer:    Janette Medley Referring Phys: Wendie Hamburg IMPRESSIONS  1. Left ventricular ejection fraction, by estimation, is 30 to 35%. The left ventricle has moderately decreased function. Left ventricular diastolic parameters are consistent with Grade II diastolic dysfunction (pseudonormalization).  2. Right ventricular systolic  function is low normal. The right ventricular size is normal.  3. Left atrial size was mildly dilated.  4. The mitral valve is normal in structure.  5. The aortic valve is normal in structure. There is mild calcification of the aortic valve. Aortic valve regurgitation is not visualized.  6. The inferior vena cava is  normal in size with greater than 50% respiratory variability, suggesting right atrial pressure of 3 mmHg. FINDINGS  Left Ventricle: Left ventricular ejection fraction, by estimation, is 30 to 35%. The left ventricle has moderately decreased function. Left ventricular diastolic parameters are consistent with Grade II diastolic dysfunction (pseudonormalization). Right Ventricle: The right ventricular size is normal. No increase in right ventricular wall thickness. Right ventricular systolic function is low normal. Left Atrium: Left atrial size was mildly dilated. Pericardium: There is no evidence of pericardial effusion. Mitral Valve: The mitral valve is normal in structure. Tricuspid Valve: The tricuspid valve is normal in structure. Tricuspid valve regurgitation is trivial. Aortic Valve: The aortic valve is normal in structure. There is mild calcification of the aortic valve. Aortic valve regurgitation is not visualized. Aortic valve mean gradient measures 2.0 mmHg. Aortic valve peak gradient measures 4.1 mmHg. Aortic valve  area, by VTI measures 2.95 cm. Pulmonic Valve: The pulmonic valve was not assessed. Venous: The inferior vena cava is normal in size with greater than 50% respiratory variability, suggesting right atrial pressure of 3 mmHg. LEFT VENTRICLE PLAX 2D LVIDd:         5.50 cm   Diastology LVIDs:         4.70 cm   LV e' lateral:   10.30 cm/s LV PW:         1.10 cm   LV E/e' lateral: 7.7 LV IVS:        1.00 cm LVOT diam:     2.10 cm LV SV:         50 LV SV Index:   26 LVOT Area:     3.46 cm  RIGHT VENTRICLE             IVC RV S prime:     13.50 cm/s  IVC diam: 1.30 cm TAPSE (M-mode): 1.8 cm  LEFT ATRIUM             Index        RIGHT ATRIUM           Index LA diam:        4.40 cm 2.27 cm/m   RA Area:     10.40 cm LA Vol (A2C):   75.0 ml 38.67 ml/m  RA Volume:   20.70 ml  10.67 ml/m LA Vol (A4C):   56.7 ml 29.23 ml/m LA Biplane Vol: 68.0 ml 35.06 ml/m  AORTIC VALVE AV Area (Vmax):    3.88 cm AV Area (Vmean):   3.44 cm AV Area (VTI):     2.95 cm AV Vmax:           101.00 cm/s AV Vmean:          69.200 cm/s AV VTI:            0.169 m AV Peak Grad:      4.1 mmHg AV Mean Grad:      2.0 mmHg LVOT Vmax:         113.00 cm/s LVOT Vmean:        68.700 cm/s LVOT VTI:          0.144 m LVOT/AV VTI ratio: 0.85  AORTA Ao Root diam: 3.30 cm MITRAL VALVE MV Area (PHT): 3.85 cm    SHUNTS MV Decel Time: 197 msec    Systemic VTI:  0.14 m MV E velocity: 79.70 cm/s  Systemic Diam: 2.10 cm MV A velocity: 53.50 cm/s MV E/A ratio:  1.49 Aditya Sabharwal Electronically signed by Zenda Highman  Sabharwal Signature Date/Time: 04/08/2024/6:26:58 PM    Final         Scheduled Meds:  atorvastatin  80 mg Oral Daily   enoxaparin  (LOVENOX ) injection  40 mg Subcutaneous Daily   ezetimibe   10 mg Oral Daily   insulin  aspart  0-5 Units Subcutaneous QHS   insulin  aspart  0-9 Units Subcutaneous TID WC   losartan  25 mg Oral Daily   metoprolol succinate  25 mg Oral Daily   spironolactone  12.5 mg Oral Daily   Continuous Infusions:   LOS: 0 days    Time spent: 45 minutes spent on 04/09/2024 caring for this patient face-to-face including chart review, ordering labs/tests, documenting, discussion with nursing staff, consultants, updating family and interview/physical exam    Rema Care [place], DO [facility] Available via Epic secure chat 7am-7pm After these hours, please refer to coverage provider listed on [email or web address] 04/09/2024, 12:52 PM

## 2024-04-09 NOTE — Consult Note (Signed)
 Palliative Care Consult Note                                  Date: 04/09/2024   Patient Name: Joel Webb  DOB: Nov 29, 1945  MRN: [id number]  Age / Sex: 78 y.o., male  PCP: Rae Bugler, MD Referring Physician: [place], Eric J, DO  Reason for Consultation: Establishing goals of care  HPI/Patient Profile: 78 y.o. male  with past medical history of HTN, HLD, CKD, DM2, GERD, and progressive supranuclear palsy (affecting walking, balance, eye movements, swallowing, and cognitive deficits) admitted on 04/05/2024 from his ALF with frequent falls and choking episodes.   In ED the patient had a chest x-ray with borderline/mild cardiomegaly with small pleural effusions and vascular congestion. Echo showed LVEF 30-35% as well as grade 2 diastolic dysfunction which is consistent with combined systolic and diastolic heart failure. Conservative management planned.   Past Medical History:  Diagnosis Date   Chronic kidney disease, stage 3 01/11/2021   ED (erectile dysfunction)    Essential hypertension 06/05/2017   Frequent falls    Gait abnormality    Gastroesophageal reflux disease 01/11/2021   Major depressive disorder    Microalbuminuria due to type 2 diabetes mellitus 05/25/2020   Mild cognitive impairment of uncertain or unknown etiology    Mixed hyperlipidemia 02/28/2016   Multiple lacunar infarcts    Plantar fasciitis    Prostatitis    Pure hypercholesterolemia 01/11/2021   PVC's (premature ventricular contractions) 01/28/2022   Uncontrolled type 2 diabetes mellitus with hyperglycemia, with long-term current use of insulin  02/28/2016    Subjective:   I have reviewed medical records including EPIC notes, labs and imaging, received update from attending physician, assessed the patient and then met with the patient and his wife Libby Ree to discuss diagnosis prognosis, GOC, EOL wishes, disposition and options.  I introduced Palliative  Medicine as specialized medical care for people living with serious illness. It focuses on providing relief from symptoms and stress of a serious illness. The goal is to improve quality of life for both the patient and the family.  Today's Discussion: Met with patient and his wife at bedside.  Patient defers most of conversation to wife.  Patient's wife shared her understanding of patient's chronic conditions including his progressive supranuclear palsy and current hospitalization including heart failure. She believes his decline in health will likely progress more rapidly with this heart failure and discharge to skilled nursing facility. The patient has recently had worsening dysphagia and multiple daily falls.  A discussion was had today regarding advanced directives. The patient's wife shared that the patient currently receives outpatient palliative services through [facility]. She also shares that they have discussed goals of care at length because they understand his disease is progressive. Since there are no advanced care planning documents and Vynca they agree to complete these documents today.  We discussed concepts specific to code status, artifical feeding and hydration, continued IV antibiotics, and rehospitalization. We discussed the difference between an aggressive medical intervention path and a palliative comfort care path for this patient. Both the patient and his wife are familiar with this topic. Patient and wife shared the patient would like to be DNR. The plan is for the patient to discharge tomorrow to [facility]. They would like to continue current care plan of treating the treatable until discharge. Moving forward when the patient declines they would like to focus on comfort measures.  MOST form completed today. The patient and family outlined their wishes moving forward (after this current hospitalization) for the following treatment decisions:  Cardiopulmonary Resuscitation: Do  Not Attempt Resuscitation (DNR/No CPR)  Medical Interventions: Comfort Measures: Keep clean, warm, and dry. Use medication by any route, positioning, wound care, and other measures to relieve pain and suffering. Use oxygen, suction and manual treatment of airway obstruction as needed for comfort. Do not transfer to the hospital unless comfort needs cannot be met in current location.  Antibiotics: No antibiotics (use other measures to relieve symptoms)  IV Fluids: No IV fluids (provide other measures to ensure comfort)  Feeding Tube: No feeding tube    Discussed the importance of continued conversation with family and the medical providers regarding overall plan of care and treatment options, ensuring decisions are within the context of the patient's values and GOCs.  Questions and concerns were addressed. Hard Choices booklet left for review. The patient's wife was encouraged to call with questions or concerns. PMT will continue to support holistically as needed.  Review of Systems  HENT:         Decreased visual field  Respiratory:  Positive for shortness of breath.   Musculoskeletal:  Positive for gait problem (frequent falls).    Objective:   Primary Diagnoses: Present on Admission: **None**   Physical Exam Vitals reviewed.  Constitutional:      General: He is not in acute distress. Cardiovascular:     Rate and Rhythm: Bradycardia present.  Pulmonary:     Effort: Tachypnea present.  Skin:    General: Skin is warm and dry.     Findings: Bruising present.  Neurological:     Mental Status: He is alert.  Psychiatric:        Mood and Affect: Mood normal.        Behavior: Behavior normal.        Thought Content: Thought content normal.     Vital Signs:  BP (!) 119/90 (BP Location: Right Arm)   Pulse (!) 40   Temp 99 F (37.2 C) (Oral)   Resp 18   Ht 5\' 9"  (1.753 m)   Wt 75.5 kg   SpO2 95%   BMI 24.58 kg/m    Advanced Care Planning:   Existing Vynca/ACP  Documentation: None  Primary Decision Maker: PATIENT. When patient is unable to make decisions his spouse Sam Overbeck is his proxy decision maker.  Code Status/Advance Care Planning: DNR   Assessment & Plan:   SUMMARY OF RECOMMENDATIONS   Changed to DNR/DNI Continue current therapies this admission MOST form completed- Future wishes- no antibiotics, no feeding tube, no IV fluids.  Discharge to SNF Continue with outpatient palliative through [facility] PMT will follow peripherally  Discussed with: bedside RN and Dr. [place]  Time Total: 75 minutes    Thank you for allowing us  to participate in the care of TRAYVEN LUMADUE PMT will continue to support holistically.   Signed by: Joaquim Muir, NP Palliative Medicine Team  Team Phone # [phone] (Nights/Weekends)  04/09/2024, 4:43 PM

## 2024-04-09 NOTE — Telephone Encounter (Signed)
 Repeat echo in hospital confirmed LVEF 30-35% as well as grade 2 diastolic dysfunction which is consistent with combined systolic and diastolic heart failure (a normal heart pumping function is 50-65%).   At this time, cardiology has signed off. His medications have been appropriately adjusted to help strengthen  his heart muscle including Toprol XL, Losartan, Spironolactone, Furosemide  PRN.   When he discharges from hospital it is important to weigh daily and report a weight gain of 3 lbs overnight or 5 lbs in one week.   We can discuss possible ischemic evaluation (such as cardiac cath) at his upcoming visit 04/20/24. However, cardiology and hospitalist teams agreed that given his progressive supranuclear palsy likely not a great candidate for any invasive evaluation and would consider medical management only.   Kaitland Lewellyn S Veldon Wager, NP

## 2024-04-09 NOTE — Progress Notes (Signed)
 Mobility Specialist Progress Note:    04/09/24 0900  Mobility  Activity Ambulated with assistance in hallway  Level of Assistance Minimal assist, patient does 75% or more  Assistive Device Front wheel walker  Distance Ambulated (ft) 120 ft  Activity Response Tolerated well  Mobility Referral Yes  Mobility visit 1 Mobility  Mobility Specialist Start Time (ACUTE ONLY) [id number]  Mobility Specialist Stop Time (ACUTE ONLY) 0949  Mobility Specialist Time Calculation (min) (ACUTE ONLY) 13 min   Pt received in bed and agreeable. Required minA for bed mobility and ambulation. Directional cues required to stay in RW and slow down d/t unsteadiness. Pt left in bed with call bell and all needs met. Bed alarm on.  D'Vante Nolon Baxter Mobility Specialist Please contact via Special educational needs teacher or Rehab office at [phone]

## 2024-04-09 NOTE — Progress Notes (Signed)
 Mobility Specialist Progress Note:    04/09/24 1056  Mobility  Activity Stood at bedside  Level of Assistance +2 (takes two people)  Assistive Device Other (Comment) (HHA)  Distance Ambulated (ft) 4 ft  Activity Response Tolerated well  Mobility Referral Yes  Mobility visit 1 Mobility  Mobility Specialist Start Time (ACUTE ONLY) 1050  Mobility Specialist Stop Time (ACUTE ONLY) 1056  Mobility Specialist Time Calculation (min) (ACUTE ONLY) 6 min   Pt received on EOB w/ bed alarm sounding. RN assisted w/ donning gown after being removed. Stood w/ minA+2 HHA and took lateral steps along bedside. Pt returned to supine. Left in bed w/ call bell and bed alarm on.  Joel Webb Mobility Specialist Please contact via Special educational needs teacher or Rehab office at [phone]

## 2024-04-09 NOTE — Plan of Care (Signed)
  Problem: Metabolic: Goal: Ability to maintain appropriate glucose levels will improve Outcome: Progressing   Problem: Nutritional: Goal: Maintenance of adequate nutrition will improve Outcome: Progressing   Problem: Clinical Measurements: Goal: Ability to maintain clinical measurements within normal limits will improve Outcome: Progressing Goal: Will remain free from infection Outcome: Progressing   Problem: Activity: Goal: Risk for activity intolerance will decrease Outcome: Progressing   Problem: Nutrition: Goal: Adequate nutrition will be maintained Outcome: Progressing   Problem: Pain Managment: Goal: General experience of comfort will improve and/or be controlled Outcome: Progressing   Problem: Safety: Goal: Ability to remain free from injury will improve Outcome: Progressing   Problem: Skin Integrity: Goal: Risk for impaired skin integrity will decrease Outcome: Progressing

## 2024-04-09 NOTE — TOC Progression Note (Addendum)
 Transition of Care [facility] - Progression Note    Patient Details  Name: Joel Webb MRN: [id number] Date of Birth: 11-14-1946  Transition of Care [facility] CM/SW Contact  Elspeth Hals, LCSW Phone Number: 04/09/2024, 11:09 AM  Clinical Narrative:   [name] HTA: offering P2P for SNF auth request with NP Donelda Fujita, [phone] by 1500 today.  MD notified 1110.   1330: Message from MD: P2P denied.  CSW spoke with wife Joel Webb by phone and informed her of denial.  She would like to move forward with transfer to [facility] and will private pay while working on [employer] application.  She reports she has not heard from financial counseling yet. (Refereral made by email 5/20)  CSW spoke with Rhonda[facility]: she can receive pt tomorrow.  She will contact Joel Webb to work out paperwork.    1415: CSW spoke with Joel Webb again.  She is going to appeal the decision but does want to move forward with moving pt to [facility] tomorrow. She has spoken with Sallyanne Creamer and they are all set.   Expected Discharge Plan: Skilled Nursing Facility Barriers to Discharge: Other (must enter comment) (insurance auth)  Expected Discharge Plan and Services In-house Referral: Clinical Social Work   Post Acute Care Choice: Skilled Nursing Facility Living arrangements for the past 2 months: Assisted Living Facility ([facility])                                       Social Determinants of Health (SDOH) Interventions SDOH Screenings   Food Insecurity: No Food Insecurity (04/06/2024)  Housing: Low Risk  (04/06/2024)  Transportation Needs: No Transportation Needs (04/06/2024)  Utilities: Not At Risk (04/06/2024)  Depression (PHQ2-9): Low Risk  (07/10/2020)  Social Connections: Socially Isolated (04/07/2024)  Tobacco Use: Medium Risk (04/05/2024)    Readmission Risk Interventions     No data to display

## 2024-04-09 NOTE — Progress Notes (Signed)
 Physical Therapy Treatment Patient Details Name: Joel Webb MRN: [id number] DOB: 1946-05-06 Today's Date: 04/09/2024   History of Present Illness Pt is a 78 y.o. male presenting to [facility] ED on 04/05/24 from ALF with frequent falls and choking episodes. PMH is significant for HTN, HLD, CKD, T2 DM, GERD, and progressive supranuclear palsy.    PT Comments  Pt received in supine and agreeable to session. Pt requires increased time and cues for all mobility tasks for safety. Pt completed gait trial with upright RW this session with improvements in upright posture and pacing. Pt continued to require cues for proximity, however was unable to push the upright RW as far forward as a standard RW improving stability. Pt able to tolerate increased distance, however was very fatigued at the end of the session. Pt continues to benefit from PT services to progress toward functional mobility goals.    If plan is discharge home, recommend the following: A little help with walking and/or transfers;A little help with bathing/dressing/bathroom;Assistance with cooking/housework;Direct supervision/assist for medications management;Direct supervision/assist for financial management;Assist for transportation;Help with stairs or ramp for entrance;Supervision due to cognitive status   Can travel by private vehicle     Yes  Equipment Recommendations  None recommended by PT    Recommendations for Other Services       Precautions / Restrictions Precautions Precautions: Fall Recall of Precautions/Restrictions: Intact Precaution/Restrictions Comments: PSP Restrictions Weight Bearing Restrictions Per Provider Order: No     Mobility  Bed Mobility Overal bed mobility: Needs Assistance Bed Mobility: Sit to Supine, Supine to Sit     Supine to sit: Supervision, HOB elevated, Used rails Sit to supine: Supervision   General bed mobility comments: increased time and use of rails    Transfers Overall transfer  level: Needs assistance Equipment used: Bilateral platform walker Transfers: Sit to/from Stand Sit to Stand: Contact guard assist           General transfer comment: cues for anterior weight shift    Ambulation/Gait Ambulation/Gait assistance: Contact guard assist Gait Distance (Feet): 200 Feet Assistive device: Bilateral platform walker Gait Pattern/deviations: Step-through pattern, Trunk flexed, Drifts right/left       General Gait Details: improved upright posture and slowed pacing with upright RW. Cues for proximity and safety due to intermittent drifting with distraction   Stairs             Wheelchair Mobility     Tilt Bed    Modified Rankin (Stroke Patients Only)       Balance Overall balance assessment: Needs assistance Sitting-balance support: No upper extremity supported, Feet supported Sitting balance-Leahy Scale: Fair Sitting balance - Comments: sitting EOB   Standing balance support: Bilateral upper extremity supported, Reliant on assistive device for balance, During functional activity Standing balance-Leahy Scale: Poor Standing balance comment: reliant on UE support                            Communication Communication Communication: Impaired Factors Affecting Communication: Reduced clarity of speech  Cognition Arousal: Alert Behavior During Therapy: Flat affect   PT - Cognitive impairments: History of cognitive impairments                         Following commands: Impaired Following commands impaired: Follows one step commands inconsistently, Follows one step commands with increased time    Cueing Cueing Techniques: Verbal cues, Visual cues  Exercises  General Comments        Pertinent Vitals/Pain Pain Assessment Pain Assessment: No/denies pain     PT Goals (current goals can now be found in the care plan section) Acute Rehab PT Goals Patient Stated Goal: to reduce falls risk, improve  balance PT Goal Formulation: With patient/family Time For Goal Achievement: 04/20/24 Progress towards PT goals: Progressing toward goals    Frequency    Min 2X/week       AM-PAC PT "6 Clicks" Mobility   Outcome Measure  Help needed turning from your back to your side while in a flat bed without using bedrails?: A Little Help needed moving from lying on your back to sitting on the side of a flat bed without using bedrails?: A Little Help needed moving to and from a bed to a chair (including a wheelchair)?: A Little Help needed standing up from a chair using your arms (e.g., wheelchair or bedside chair)?: A Little Help needed to walk in hospital room?: A Little Help needed climbing 3-5 steps with a railing? : A Lot 6 Click Score: 17    End of Session Equipment Utilized During Treatment: Gait belt Activity Tolerance: Patient tolerated treatment well Patient left: in bed;with call bell/phone within reach;with nursing/sitter in room Nurse Communication: Mobility status PT Visit Diagnosis: Other abnormalities of gait and mobility (R26.89);Other symptoms and signs involving the nervous system (R29.898)     Time: 1478-2956 PT Time Calculation (min) (ACUTE ONLY): 16 min  Charges:    $Gait Training: 8-22 mins PT General Charges $$ ACUTE PT VISIT: 1 Visit                     Michaelle Adolphus, PTA Acute Rehabilitation Services Secure Chat Preferred  [phone]    Michaelle Adolphus 04/09/2024, 2:31 PM

## 2024-04-10 DIAGNOSIS — R296 Repeated falls: Secondary | ICD-10-CM | POA: Diagnosis not present

## 2024-04-10 DIAGNOSIS — I5023 Acute on chronic systolic (congestive) heart failure: Secondary | ICD-10-CM | POA: Diagnosis not present

## 2024-04-10 DIAGNOSIS — E782 Mixed hyperlipidemia: Secondary | ICD-10-CM | POA: Diagnosis not present

## 2024-04-10 DIAGNOSIS — R1312 Dysphagia, oropharyngeal phase: Secondary | ICD-10-CM | POA: Diagnosis not present

## 2024-04-10 DIAGNOSIS — E1165 Type 2 diabetes mellitus with hyperglycemia: Secondary | ICD-10-CM | POA: Diagnosis not present

## 2024-04-10 DIAGNOSIS — F329 Major depressive disorder, single episode, unspecified: Secondary | ICD-10-CM | POA: Diagnosis not present

## 2024-04-10 DIAGNOSIS — R2681 Unsteadiness on feet: Secondary | ICD-10-CM | POA: Diagnosis not present

## 2024-04-10 DIAGNOSIS — Z794 Long term (current) use of insulin: Secondary | ICD-10-CM | POA: Diagnosis not present

## 2024-04-10 DIAGNOSIS — G231 Progressive supranuclear ophthalmoplegia [Steele-Richardson-Olszewski]: Secondary | ICD-10-CM | POA: Diagnosis not present

## 2024-04-10 DIAGNOSIS — M6281 Muscle weakness (generalized): Secondary | ICD-10-CM | POA: Diagnosis not present

## 2024-04-10 DIAGNOSIS — E1129 Type 2 diabetes mellitus with other diabetic kidney complication: Secondary | ICD-10-CM | POA: Diagnosis not present

## 2024-04-10 DIAGNOSIS — R41841 Cognitive communication deficit: Secondary | ICD-10-CM | POA: Diagnosis not present

## 2024-04-10 LAB — GLUCOSE, CAPILLARY
Glucose-Capillary: 182 mg/dL — ABNORMAL HIGH (ref 70–99)
Glucose-Capillary: 162 mg/dL — ABNORMAL HIGH (ref 70–99)

## 2024-04-10 MED ORDER — METOPROLOL SUCCINATE ER 25 MG PO TB24: 25.0000 mg | ORAL_TABLET | Freq: Every day | ORAL | Status: AC

## 2024-04-10 MED ORDER — LOSARTAN POTASSIUM 25 MG PO TABS: 25.0000 mg | ORAL_TABLET | Freq: Every day | ORAL | Status: AC

## 2024-04-10 MED ORDER — SPIRONOLACTONE 25 MG PO TABS: 12.5000 mg | ORAL_TABLET | Freq: Every day | ORAL | Status: DC

## 2024-04-10 MED ORDER — FUROSEMIDE 20 MG PO TABS: 20.0000 mg | ORAL_TABLET | Freq: Every day | ORAL | Status: DC | PRN

## 2024-04-10 NOTE — Discharge Summary (Signed)
 Physician Discharge Summary  Joel Webb MVH:[id number] DOB: [birth year] DOA: 04/05/2024  PCP: Rae Bugler, MD  Admit date: 04/05/2024 Discharge date: 04/10/2024  [facility]  Recommendations for Outpatient Follow-up:  Outpatient follow-up with palliative care Outpatient follow-up with cardiology as scheduled on 04/20/2024   Discharge Condition: Stable CODE STATUS: DNR  Diet recommendation:  Dysphagia 3 (mechanical soft);Thin liquid, 2 g sodium restricted diet, 1500 mL fluid restriction Liquids provided via: Cup;No straw (Provale cup) Medication Administration: Whole meds with puree Supervision: Patient able to self feed;Full supervision/cueing for compensatory strategies Compensations: Slow rate;Small sips/bites Postural Changes and/or Swallow Maneuvers: Seated upright 90 degrees   History of present illness:  Joel Webb is a 78 y.o. male with past medical history significant for HTN, HLD, CKD stage IIIb, DM2, progressive supranuclear palsy affecting ambulation/balance/swallowing with cognitive defects, GERD who presented to [facility] ED from [facility] with increased falls and choking episodes leading to poor oral intake.  No reported fever/chills.  Apparently at ALF, patient has been falling at least 2 times daily, at baseline utilizes a walker for ambulation.  Additionally having more episodes of dysphagia with worsening left-sided weakness for roughly 1 month.   In the ED, temperature 97.9 F, HR 83, RR 18, BP 137/82, SpO2 95% on room air.  WBC 8.4, hemoglobin 14.4, platelet count 143.  Sodium 138, potassium 4.0, chloride 107, CO2 22, glucose 176, BUN 25, creatinine 1.46.  AST 16, ALT 12, total Ruben 0.9.  BNP 878.5.  A1c 5.5.  Urinalysis with small hemoglobin, negative nitrite/leukocytes, no bacteria, 0-5 WBCs.  CT head/C-spine without contrast with no acute intracranial pathology, noted small vessel white matter disease and  old lacunar infarction on the right limited informed nuclear, no fracture/subluxation of the C-spine, moderate disc space height loss at C6/7.  Chest x-ray with borderline/mild cardiomegaly with small pleural effusions and vascular congestion.  Due to unsafe discharge from the ED, EDP requested admission for further evaluation and management of dysphagia, falls and acute CHF.  TRH consulted for admission.  Hospital course:  Frequent falls in the setting of progressive supranuclear palsy Follows with neurology outpatient.  Seen by PT/OT with recommendation of SNF placement.  Palliative care consulted for assistance with goals of care and medical decision making, now DNR.  Outpatient follow-up with neurology.  Overall poor prognosis long-term.  Fall precautions.   Acute systolic congestive heart failure  Presented with BNP elevated greater than 800, pulmonary edema, cardiomegaly on chest x-ray, bilateral lower extremity edema.  No previous TTE available for review.  TTE with LVEF 35 to 40%, unable to evaluate LV regional wall motion, no aortic stenosis, IVC normal in size.  Seen by cardiology while inpatient.  Amlodipine discontinued and started on metoprolol succinate 25 mg p.o. daily, losartan 25 mg p.o. daily, spironolactone 12.5 mg p.o. daily and furosemide  as needed.  Continue low salt diet, fluid restriction of 1500 mL/day.  Patient declined any further testing/treatment inpatient, has outpatient follow-up with cardiology on 04/20/2024 although with patient's progressive supranuclear palsy likely not a great candidate for any invasive evaluation.   Oropharyngeal dysphagia Patient seen by speech therapy, underwent MBS and cleared for dysphagia 3 diet. Dysphagia 3 (mechanical soft);Thin liquid Liquids provided via: Cup;No straw (thin liquids via Provale cup; water via regular cup) Medication Administration: Whole meds with puree Supervision: Patient able to self feed;Full supervision/cueing for  compensatory strategies Compensations: Slow rate;Small sips/bites Postural Changes and/or Swallow Maneuvers: Seated upright 90 degrees Aspiration precautions  Hypertension Will discontinue home amlodipine.  Started on Metoprolol succinate 25 mg daily, Losartan 25 mg p.o. daily, spironolactone 12.5 mg p.o. daily, furosemide  daily as needed   Hyperlipidemia Atorvastatin 80 mg p.o. daily, Zetia  10 g p.o. daily   Type 2 diabetes with hyperglycemia Hemoglobin A1c 5.5.  On Mounjaro and Novolin sliding scale outpatient.   Weakness/debility/deconditioning/gait disturbance: PT/OT recommend SNF placement; patient's insurance carrier requested peer to peer which was completed on 04/09/2024, with ultimate denial by insurance carrier.  Patient family to pay out-of-pocket.  Discharging to [facility].  Discharge Diagnoses:  Principal Problem:   Frequent falls Active Problems:   Acute systolic heart failure [facility])    Discharge Instructions  Discharge Instructions     Call MD for:  difficulty breathing, headache or visual disturbances   Complete by: As directed    Call MD for:  extreme fatigue   Complete by: As directed    Call MD for:  persistant dizziness or light-headedness   Complete by: As directed    Call MD for:  persistant nausea and vomiting   Complete by: As directed    Call MD for:  severe uncontrolled pain   Complete by: As directed    Call MD for:  temperature >100.4   Complete by: As directed    Diet - low sodium heart healthy   Complete by: As directed    Increase activity slowly   Complete by: As directed       Allergies as of 04/10/2024   No Known Allergies      Medication List     STOP taking these medications    olmesartan  20 MG tablet Commonly known as: BENICAR        TAKE these medications    atorvastatin 80 MG tablet Commonly known as: LIPITOR Take 80 mg by mouth daily.   Cholecalciferol 50 MCG (2000 UT) Caps Take 2,000 Units by mouth  daily.   DULoxetine 60 MG capsule Commonly known as: CYMBALTA 60 mg daily.   ezetimibe  10 MG tablet Commonly known as: ZETIA  Take 1 tablet (10 mg total) by mouth daily.   furosemide  20 MG tablet Commonly known as: LASIX  Take 1 tablet (20 mg total) by mouth daily as needed for fluid or edema (For 3 pound weight gain in 1 day or 5 pound weight gain in 1 week). Give 1 tablet by mouth as needed for lower extremity swelling.  May take once per week as needed. If needed more than once, contact MD. What changed:  when to take this reasons to take this Another medication with the same name was removed. Continue taking this medication, and follow the directions you see here.   loperamide 2 MG capsule Commonly known as: IMODIUM Take 2 mg by mouth every 4 (four) hours as needed for diarrhea or loose stools.   losartan 25 MG tablet Commonly known as: COZAAR Take 1 tablet (25 mg total) by mouth daily.   metoprolol succinate 25 MG 24 hr tablet Commonly known as: TOPROL-XL Take 1 tablet (25 mg total) by mouth daily.   neomycin-bacitracin-polymyxin [phone] Oint SMARTSIG:1 sparingly Topical Daily   NovoLIN R 100 UNIT/ML injection Generic drug: insulin  regular INJECT 0.08 MLS(8 UNITS TOTAL) INTO THE SKIN TWICE DAILY BEFORE A MEAL; ALSO INJECT 10 UNITS UNDER THE SKIN ONCE DAILY AT SUPPER What changed: See the new instructions.   omeprazole 20 MG capsule Commonly known as: PRILOSEC Take 20 mg by mouth daily as needed (acid reflux anti-spasmodics). PRN  spironolactone 25 MG tablet Commonly known as: ALDACTONE Take 0.5 tablets (12.5 mg total) by mouth daily.   tamsulosin 0.4 MG Caps capsule Commonly known as: FLOMAX Take 0.4 mg by mouth 2 (two) times daily.   tirzepatide 10 MG/0.5ML Pen Commonly known as: MOUNJARO Inject 7.5 mg into the skin once a week. Inject 10mg  subcutaneously one time per day on Thursday.        Follow-up Information     Rae Bugler, MD. Schedule an  appointment as soon as possible for a visit in 1 week(s).   Specialty: Family Medicine Contact information: [phone] [address] [place] [address] [phone]         Clearnce Curia, NP. Go on 04/20/2024.   Specialty: Cardiology Contact information: [address] [place] [address] [phone]                No Known Allergies  Consultations: Cardiology Palliative care   Procedures/Studies: ECHOCARDIOGRAM LIMITED Result Date: 04/08/2024    ECHOCARDIOGRAM LIMITED REPORT   Patient Name:   Joel Webb Date of Exam: 04/08/2024 Medical Rec #:  [id number]        Height:       69.0 in Accession #:    [id number]       Weight:       172.4 lb Date of Birth:  07/11/1946       BSA:          1.940 m Patient Age:    77 years         BP:           138/107 mmHg Patient Gender: M                HR:           86 bpm. Exam Location:  Inpatient Procedure: 2D Echo, Limited Echo, Cardiac Doppler, Color Doppler and            Intracardiac Opacification Agent (Both Spectral and Color Flow            Doppler were utilized during procedure). Indications:    CHF  History:        Patient has prior history of Echocardiogram examinations, most                 recent 04/06/2024. Risk Factors:Hypertension.  Sonographer:    Janette Medley Referring Phys: Wendie Hamburg IMPRESSIONS  1. Left ventricular ejection fraction, by estimation, is 30 to 35%. The left ventricle has moderately decreased function. Left ventricular diastolic parameters are consistent with Grade II diastolic dysfunction (pseudonormalization).  2. Right ventricular systolic function is low normal. The right ventricular size is normal.  3. Left atrial size was mildly dilated.  4. The mitral valve is normal in structure.  5. The aortic valve is normal in structure. There is mild calcification of the aortic valve. Aortic valve regurgitation is not visualized.  6. The inferior vena cava is normal in size with greater  than 50% respiratory variability, suggesting right atrial pressure of 3 mmHg. FINDINGS  Left Ventricle: Left ventricular ejection fraction, by estimation, is 30 to 35%. The left ventricle has moderately decreased function. Left ventricular diastolic parameters are consistent with Grade II diastolic dysfunction (pseudonormalization). Right Ventricle: The right ventricular size is normal. No increase in right ventricular wall thickness. Right ventricular systolic function is low normal. Left Atrium: Left atrial size was mildly dilated. Pericardium: There is no evidence of pericardial effusion. Mitral  Valve: The mitral valve is normal in structure. Tricuspid Valve: The tricuspid valve is normal in structure. Tricuspid valve regurgitation is trivial. Aortic Valve: The aortic valve is normal in structure. There is mild calcification of the aortic valve. Aortic valve regurgitation is not visualized. Aortic valve mean gradient measures 2.0 mmHg. Aortic valve peak gradient measures 4.1 mmHg. Aortic valve  area, by VTI measures 2.95 cm. Pulmonic Valve: The pulmonic valve was not assessed. Venous: The inferior vena cava is normal in size with greater than 50% respiratory variability, suggesting right atrial pressure of 3 mmHg. LEFT VENTRICLE PLAX 2D LVIDd:         5.50 cm   Diastology LVIDs:         4.70 cm   LV e' lateral:   10.30 cm/s LV PW:         1.10 cm   LV E/e' lateral: 7.7 LV IVS:        1.00 cm LVOT diam:     2.10 cm LV SV:         50 LV SV Index:   26 LVOT Area:     3.46 cm  RIGHT VENTRICLE             IVC RV S prime:     13.50 cm/s  IVC diam: 1.30 cm TAPSE (M-mode): 1.8 cm LEFT ATRIUM             Index        RIGHT ATRIUM           Index LA diam:        4.40 cm 2.27 cm/m   RA Area:     10.40 cm LA Vol (A2C):   75.0 ml 38.67 ml/m  RA Volume:   20.70 ml  10.67 ml/m LA Vol (A4C):   56.7 ml 29.23 ml/m LA Biplane Vol: 68.0 ml 35.06 ml/m  AORTIC VALVE AV Area (Vmax):    3.88 cm AV Area (Vmean):   3.44 cm AV  Area (VTI):     2.95 cm AV Vmax:           101.00 cm/s AV Vmean:          69.200 cm/s AV VTI:            0.169 m AV Peak Grad:      4.1 mmHg AV Mean Grad:      2.0 mmHg LVOT Vmax:         113.00 cm/s LVOT Vmean:        68.700 cm/s LVOT VTI:          0.144 m LVOT/AV VTI ratio: 0.85  AORTA Ao Root diam: 3.30 cm MITRAL VALVE MV Area (PHT): 3.85 cm    SHUNTS MV Decel Time: 197 msec    Systemic VTI:  0.14 m MV E velocity: 79.70 cm/s  Systemic Diam: 2.10 cm MV A velocity: 53.50 cm/s MV E/A ratio:  1.49 Aditya Sabharwal Electronically signed by Alwin Baars Signature Date/Time: 04/08/2024/6:26:58 PM    Final    MR BRAIN WO CONTRAST Result Date: 04/06/2024 CLINICAL DATA:  Initial evaluation for mental status change, unknown cause. EXAM: MRI HEAD WITHOUT CONTRAST TECHNIQUE: Multiplanar, multiecho pulse sequences of the brain and surrounding structures were obtained without intravenous contrast. COMPARISON:  CT from 04/05/2024 FINDINGS: Brain: Mild age-related cerebral atrophy. Patchy T2/FLAIR hyperintensity involving the supratentorial cerebral white matter and pons, consistent with chronic small vessel ischemic disease, mild in nature. Remote lacunar infarct present at  the right basal ganglia. No evidence for acute or subacute infarct. No acute or chronic intracranial blood products. No mass lesion, midline shift or mass effect. No hydrocephalus or extra-axial fluid collection. Pituitary gland within normal limits. Vascular: Major intracranial vascular flow voids are maintained. Skull and upper cervical spine: Craniocervical junction with normal limits. Bone marrow signal intensity normal. No scalp soft tissue abnormality. Sinuses/Orbits: Globes orbital soft tissues within normal limits. Small right maxillary sinus retention cyst noted. Paranasal sinuses are otherwise clear. No mastoid effusion. Other: None. IMPRESSION: 1. No acute intracranial abnormality. 2. Mild age-related cerebral atrophy with chronic small  vessel ischemic disease, with remote lacunar infarct at the right basal ganglia. Electronically Signed   By: Virgia Griffins M.D.   On: 04/06/2024 18:23   ECHOCARDIOGRAM COMPLETE Result Date: 04/06/2024    ECHOCARDIOGRAM REPORT   Patient Name:   Joel Webb Date of Exam: 04/06/2024 Medical Rec #:  [id number]        Height:       69.0 in Accession #:    [id number]       Weight:       182.0 lb Date of Birth:  [birth year]       BSA:          1.985 m Patient Age:    77 years         BP:           107/59 mmHg Patient Gender: M                HR:           87 bpm. Exam Location:  Inpatient Procedure: 2D Echo, 3D Echo, Color Doppler and Cardiac Doppler (Both Spectral            and Color Flow Doppler were utilized during procedure). Indications:    CHF-acute diastolic  History:        Patient has no prior history of Echocardiogram examinations.                 Arrythmias:PVC; Risk Factors:Diabetes, Dyslipidemia and                 Hypertension. CKD stage 3.  Sonographer:    Juanita Shaw Referring Phys: [id number] CAROLE N HALL  Sonographer Comments: Image acquisition challenging due to respiratory motion. IMPRESSIONS  1. Left ventricular ejection fraction, by estimation, is 35 to 40%. The left ventricle has moderately decreased function. Left ventricular endocardial border not optimally defined to evaluate regional wall motion. The left ventricular internal cavity size was mildly dilated. Left ventricular diastolic parameters are indeterminate.  2. Right ventricular systolic function is normal. The right ventricular size is normal. Tricuspid regurgitation signal is inadequate for assessing PA pressure.  3. The mitral valve is normal in structure. Trivial mitral valve regurgitation. No evidence of mitral stenosis.  4. The aortic valve is tricuspid. Aortic valve regurgitation is trivial. No aortic stenosis is present.  5. The inferior vena cava is normal in size with greater than 50% respiratory variability,  suggesting right atrial pressure of 3 mmHg. Comparison(s): Patient did not have IV access for definity microbubble contrast. Images are technically difficult and estimation of left ventricular systolic function is of low confidence. Conclusion(s)/Recommendation(s): The study should be repeated with Definity contrast or an alternative imaging modality should be used. FINDINGS  Left Ventricle: Left ventricular ejection fraction, by estimation, is 35 to 40%. The left ventricle has moderately decreased function. Left ventricular endocardial border not  optimally defined to evaluate regional wall motion. The left ventricular internal cavity size was mildly dilated. There is no left ventricular hypertrophy. Left ventricular diastolic function could not be evaluated due to nondiagnostic images. Left ventricular diastolic parameters are indeterminate. Right Ventricle: The right ventricular size is normal. No increase in right ventricular wall thickness. Right ventricular systolic function is normal. Tricuspid regurgitation signal is inadequate for assessing PA pressure. Left Atrium: Left atrial size was normal in size. Right Atrium: Right atrial size was normal in size. Pericardium: There is no evidence of pericardial effusion. Mitral Valve: The mitral valve is normal in structure. Trivial mitral valve regurgitation. No evidence of mitral valve stenosis. MV peak gradient, 2.3 mmHg. The mean mitral valve gradient is 1.0 mmHg. Tricuspid Valve: The tricuspid valve is normal in structure. Tricuspid valve regurgitation is not demonstrated. No evidence of tricuspid stenosis. Aortic Valve: The aortic valve is tricuspid. Aortic valve regurgitation is trivial. No aortic stenosis is present. Aortic valve mean gradient measures 2.0 mmHg. Aortic valve peak gradient measures 3.1 mmHg. Aortic valve area, by VTI measures 2.52 cm. Pulmonic Valve: The pulmonic valve was normal in structure. Pulmonic valve regurgitation is not visualized. No  evidence of pulmonic stenosis. Aorta: The aortic root is normal in size and structure. Venous: The inferior vena cava is normal in size with greater than 50% respiratory variability, suggesting right atrial pressure of 3 mmHg. IAS/Shunts: No atrial level shunt detected by color flow Doppler.  LEFT VENTRICLE PLAX 2D LVIDd:         5.70 cm      Diastology LVIDs:         4.90 cm      LV e' medial:    7.40 cm/s LV PW:         1.20 cm      LV E/e' medial:  7.8 LV IVS:        0.90 cm      LV e' lateral:   12.10 cm/s LVOT diam:     2.30 cm      LV E/e' lateral: 4.8 LV SV:         34 LV SV Index:   17 LVOT Area:     4.15 cm  LV Volumes (MOD) LV vol d, MOD A2C: 139.0 ml LV vol d, MOD A4C: 210.0 ml LV vol s, MOD A2C: 60.9 ml LV vol s, MOD A4C: 112.0 ml LV SV MOD A2C:     78.1 ml LV SV MOD A4C:     210.0 ml LV SV MOD BP:      85.9 ml RIGHT VENTRICLE             IVC RV Basal diam:  3.40 cm     IVC diam: 1.20 cm RV Mid diam:    2.80 cm RV S prime:     11.80 cm/s TAPSE (M-mode): 1.8 cm LEFT ATRIUM             Index        RIGHT ATRIUM           Index LA diam:        4.20 cm 2.12 cm/m   RA Area:     10.50 cm LA Vol (A2C):   72.5 ml 36.53 ml/m  RA Volume:   21.20 ml  10.68 ml/m LA Vol (A4C):   35.5 ml 17.89 ml/m LA Biplane Vol: 51.2 ml 25.80 ml/m  AORTIC VALVE  PULMONIC VALVE AV Area (Vmax):    2.85 cm     PV Vmax:       0.75 m/s AV Area (Vmean):   2.12 cm     PV Peak grad:  2.2 mmHg AV Area (VTI):     2.52 cm AV Vmax:           88.50 cm/s AV Vmean:          62.500 cm/s AV VTI:            0.136 m AV Peak Grad:      3.1 mmHg AV Mean Grad:      2.0 mmHg LVOT Vmax:         60.70 cm/s LVOT Vmean:        31.900 cm/s LVOT VTI:          0.083 m LVOT/AV VTI ratio: 0.61  AORTA Ao Root diam: 3.70 cm Ao Asc diam:  3.65 cm MITRAL VALVE MV Area (PHT): 4.06 cm    SHUNTS MV Area VTI:   2.38 cm    Systemic VTI:  0.08 m MV Peak grad:  2.3 mmHg    Systemic Diam: 2.30 cm MV Mean grad:  1.0 mmHg MV Vmax:       0.76 m/s MV  Vmean:      44.9 cm/s MV Decel Time: 187 msec MV E velocity: 57.80 cm/s MV A velocity: 46.00 cm/s MV E/A ratio:  1.26 Mihai Croitoru MD Electronically signed by Luana Rumple MD Signature Date/Time: 04/06/2024/5:52:07 PM    Final    DG Swallowing Func-Speech Pathology Result Date: 04/06/2024 Table formatting from the original result was not included. Modified Barium Swallow Study Patient Details Name: Joel Webb MRN: [id number] Date of Birth: January 09, 1946 Today's Date: 04/06/2024 HPI/PMH: HPI: Pt is a 78 yo male presenting from ALF with frequent falls and choking episodes. MBS in 2023 revealed minimal oropharyngeal dysphagia with aspiration of thin liquids on sequential sips (PAS 7, sensed but not cleared by cough). PMH includes: PSP, GERD, MCI, HTN, HLD, CKD, DMII Clinical Impression: Pt has an oropharyngeal dysphagia with reduced coordination that contributes to aspiration with thin and nectar thick liquids. His oral phase is marked by repetitive/disorganized lingual motion, disorganized chewing with small pieces of cracker not fully masticated before swallowing, and inconsistent oral clearance. Premature spillage was noted, as was piecemeal swallowing at times, making amount of oral residue quite variable, although ultimately he cleared his mouth well. The barium tablet could not be orally transited though and was manually removed. Reduced oral coordination resulted in inconsistent site of initiation, with liquids often spilling into his airway before airway closure. Aspiration of thin and nectar thick liquids is inconsistently sensed (PAS 7 and 8), and even when he does cough, he does not clear his airway. Coughing sometimes appears to be delayed, and sometimes he coughs without overt connection to aspiration, which could be indicative of further delayed sensation. Pharyngeally there is reduced soft palate elevation, anterior hyoid movement, laryngeal vestibule closure, epiglottic movement, and pharyngeal  squeeze that contribute to both reduced safety and efficiency. A collection of residue remains primarily in the valleculae. Trace penetration, sometimes from this residue, occurs after the swallow with honey thick liquids. Compensatory strategies tested include oral hold, chin tuck, and use of straw, but they did not significantly improve airway protection. Small, single sips (cued to "sip on hot coffee") resulted in better airway protection with thin liquids (penetration x1) but not with nectar thick liquids (PAS  7). Discussed findings with pt and spouse, who was present for the study. We discussed a more conservative approach, including honey thick liquids (with water protocol) as this did not result in aspiration even with larger volumes and self-pacing. An alternative option would be to remain on thin liquids, as this is his baseline diet and he appears to have been tolerating it with no pulmonary issues despite signs of dysphagia for months. If on thin liquids, would consider a Provale cup to regulate bolus size until he can more consistently take small, single sips. Either option could include f/u SLP for swallowing therapy to optimize function. At this time, pt and his wife would like to remain on mechanical soft diet and thin liquids, with careful use of strategies to reduce but not eliminate aspiration risk. Factors that may increase risk of adverse event in presence of aspiration [name] & [name] 2021): Factors that may increase risk of adverse event in presence of aspiration [name] & [name] 2021): Reduced cognitive function; Limited mobility; Weak cough; Aspiration of thick, dense, and/or acidic materials Recommendations/Plan: Swallowing Evaluation Recommendations Swallowing Evaluation Recommendations Recommendations: PO diet PO Diet Recommendation: Dysphagia 3 (Mechanical soft); Thin liquids (Level 0) Liquid Administration via: Cup; Other (Comment) (provale cup) Medication Administration: Whole meds  with puree Supervision: Patient able to self-feed; Full supervision/cueing for swallowing strategies Swallowing strategies  : Minimize environmental distractions; Slow rate; Small bites/sips (one small sip at a time, like you are "sipping on hot coffee" (can use Provale cup)) Postural changes: Position pt fully upright for meals Oral care recommendations: Oral care QID (4x/day) Treatment Plan Treatment Plan Treatment recommendations: Therapy as outlined in treatment plan below Follow-up recommendations: Skilled nursing-short term rehab (<3 hours/day) Functional status assessment: Patient has had a recent decline in their functional status and demonstrates the ability to make significant improvements in function in a reasonable and predictable amount of time. Treatment frequency: Min 2x/week Treatment duration: 2 weeks Interventions: Aspiration precaution training; Compensatory techniques; Patient/family education; Trials of upgraded texture/liquids; Respiratory muscle strength training Recommendations Recommendations for follow up therapy are one component of a multi-disciplinary discharge planning process, led by the attending physician.  Recommendations may be updated based on patient status, additional functional criteria and insurance authorization. Assessment: Orofacial Exam: Orofacial Exam Oral Cavity: Oral Hygiene: WFL Oral Cavity - Dentition: Adequate natural dentition; Other (Comment) (partial) Orofacial Anatomy: WFL Anatomy: Anatomy: WFL Boluses Administered: Boluses Administered Boluses Administered: Thin liquids (Level 0); Mildly thick liquids (Level 2, nectar thick); Moderately thick liquids (Level 3, honey thick); Puree; Solid  Oral Impairment Domain: Oral Impairment Domain Lip Closure: No labial escape (as can be observed - limited view throughout the study) Tongue control during bolus hold: Posterior escape of less than half of bolus Bolus preparation/mastication: Disorganized chewing/mashing with  solid pieces of bolus unchewed Bolus transport/lingual motion: Repetitive/disorganized tongue motion Oral residue: Majority of bolus remaining Location of oral residue : Floor of mouth; Tongue; Lateral sulci Initiation of pharyngeal swallow : Pyriform sinuses  Pharyngeal Impairment Domain: Pharyngeal Impairment Domain Soft palate elevation: Trace column of contrast or air between SP and PW Laryngeal elevation: Complete superior movement of thyroid  cartilage with complete approximation of arytenoids to epiglottic petiole Anterior hyoid excursion: Partial anterior movement Epiglottic movement: Partial inversion Laryngeal vestibule closure: Incomplete, narrow column air/contrast in laryngeal vestibule Pharyngeal stripping wave : Present - diminished Pharyngeal contraction (A/P view only): N/A Pharyngoesophageal segment opening: Complete distension and complete duration, no obstruction of flow Tongue base retraction: No contrast between tongue base  and posterior pharyngeal wall (PPW) Pharyngeal residue: Collection of residue within or on pharyngeal structures Location of pharyngeal residue: Tongue base; Valleculae  Esophageal Impairment Domain: Esophageal Impairment Domain Esophageal clearance upright position: Complete clearance, esophageal coating Pill: Pill Consistency administered: Puree Puree: Impaired (see clinical impressions) Penetration/Aspiration Scale Score: Penetration/Aspiration Scale Score 1.  Material does not enter airway: Puree 3.  Material enters airway, remains ABOVE vocal cords and not ejected out: Solid 5.  Material enters airway, CONTACTS cords and not ejected out: Moderately thick liquids (Level 3, honey thick) 8.  Material enters airway, passes BELOW cords without attempt by patient to eject out (silent aspiration) : Thin liquids (Level 0); Mildly thick liquids (Level 2, nectar thick) Compensatory Strategies: Compensatory Strategies Compensatory strategies: Yes Straw: Ineffective Ineffective  Straw: Thin liquid (Level 0) Chin tuck: Ineffective Ineffective Chin Tuck: Thin liquid (Level 0) Oral bolus hold: Ineffective Ineffective Oral Bolus Hold : Mildly thick liquid (Level 2, nectar thick)   General Information: Caregiver present: Yes (wife)  Diet Prior to this Study: Dysphagia 3 (mechanical soft); Other (Comment) (with sips of water)   Temperature : Normal   Respiratory Status: WFL   Supplemental O2: None (Room air)   History of Recent Intubation: No  Behavior/Cognition: Alert; Cooperative; Pleasant mood Self-Feeding Abilities: Able to self-feed Baseline vocal quality/speech: Normal (speech dysarthric) Volitional Cough: Able to elicit Volitional Swallow: Able to elicit Exam Limitations: No limitations Goal Planning: Prognosis for improved oropharyngeal function: Good No data recorded No data recorded Patient/Family Stated Goal: want his swallowing assessed, wants him to get more help upon discharge Consulted and agree with results and recommendations: Patient; Family member/caregiver Pain: Pain Assessment Pain Assessment: Faces Faces Pain Scale: 0 End of Session: Start Time:SLP Start Time (ACUTE ONLY): 1257 Stop Time: SLP Stop Time (ACUTE ONLY): 1328 Time Calculation:SLP Time Calculation (min) (ACUTE ONLY): 31 min Charges: SLP Evaluations $ SLP Speech Visit: 1 Visit SLP Evaluations $BSS Swallow: 1 Procedure $MBS Swallow: 1 Procedure SLP visit diagnosis: SLP Visit Diagnosis: Dysphagia, unspecified (R13.10) Past Medical History: Past Medical History: Diagnosis Date  Chronic kidney disease, stage 3 01/11/2021  ED (erectile dysfunction)   Essential hypertension 06/05/2017  Frequent falls   Gait abnormality   Gastroesophageal reflux disease 01/11/2021  Major depressive disorder   Microalbuminuria due to type 2 diabetes mellitus 05/25/2020  Mild cognitive impairment of uncertain or unknown etiology   Mixed hyperlipidemia 02/28/2016  Multiple lacunar infarcts   Plantar fasciitis   Prostatitis   Pure  hypercholesterolemia 01/11/2021  PVC's (premature ventricular contractions) 01/28/2022  Uncontrolled type 2 diabetes mellitus with hyperglycemia, with long-term current use of insulin  02/28/2016 Past Surgical History: Past Surgical History: Procedure Laterality Date  CIRCUMCISION    COLONOSCOPY    MOUTH SURGERY  01/2020 [name]., M.A. CCC-SLP Acute Rehabilitation Services Office: [phone] Secure chat preferred 04/06/2024, 2:36 PM  DG Chest 1 View Result Date: 04/05/2024 CLINICAL DATA:  Fall EXAM: CHEST  1 VIEW COMPARISON:  03/02/2024 FINDINGS: Borderline to mild cardiomegaly with small pleural effusions and vascular congestion similar compared to prior. Patchy atelectasis or infiltrate at the left base. No pneumothorax IMPRESSION: Borderline to mild cardiomegaly with small pleural effusions and vascular congestion. Patchy atelectasis or infiltrate at the left base. Electronically Signed   By: Esmeralda Hedge M.D.   On: 04/05/2024 22:10   CT Head Wo Contrast Result Date: 04/05/2024 CLINICAL DATA:  Multiple falls EXAM: CT HEAD WITHOUT CONTRAST CT CERVICAL SPINE WITHOUT CONTRAST TECHNIQUE: Multidetector CT imaging of the head and cervical  spine was performed following the standard protocol without intravenous contrast. Multiplanar CT image reconstructions of the cervical spine were also generated. RADIATION DOSE REDUCTION: This exam was performed according to the departmental dose-optimization program which includes automated exposure control, adjustment of the mA and/or kV according to patient size and/or use of iterative reconstruction technique. COMPARISON:  03/02/2024 FINDINGS: CT HEAD FINDINGS Brain: No evidence of acute infarction, hemorrhage, hydrocephalus, extra-axial collection or mass lesion/mass effect. Periventricular white matter hypodensity. Lacunar infarction of the right lentiform nuclei. Vascular: No hyperdense vessel or unexpected calcification. Skull: Normal. Negative for fracture or focal  lesion. Sinuses/Orbits: No acute finding. Other: None. CT CERVICAL SPINE FINDINGS Alignment: Normal. Skull base and vertebrae: No acute fracture. No primary bone lesion or focal pathologic process. Soft tissues and spinal canal: No prevertebral fluid or swelling. No visible canal hematoma. Disc levels: Focally moderate disc space height loss of C6-C7 with otherwise intact disc spaces. Upper chest: Negative. Other: None. IMPRESSION: 1. No acute intracranial pathology. Small-vessel white matter disease and old lacunar infarction of the right lentiform nuclei. 2. No fracture or static subluxation of the cervical spine. 3. Focally moderate disc space height loss of C6-C7 with otherwise intact disc spaces. Electronically Signed   By: Fredricka Jenny M.D.   On: 04/05/2024 21:58   CT Cervical Spine Wo Contrast Result Date: 04/05/2024 CLINICAL DATA:  Multiple falls EXAM: CT HEAD WITHOUT CONTRAST CT CERVICAL SPINE WITHOUT CONTRAST TECHNIQUE: Multidetector CT imaging of the head and cervical spine was performed following the standard protocol without intravenous contrast. Multiplanar CT image reconstructions of the cervical spine were also generated. RADIATION DOSE REDUCTION: This exam was performed according to the departmental dose-optimization program which includes automated exposure control, adjustment of the mA and/or kV according to patient size and/or use of iterative reconstruction technique. COMPARISON:  03/02/2024 FINDINGS: CT HEAD FINDINGS Brain: No evidence of acute infarction, hemorrhage, hydrocephalus, extra-axial collection or mass lesion/mass effect. Periventricular white matter hypodensity. Lacunar infarction of the right lentiform nuclei. Vascular: No hyperdense vessel or unexpected calcification. Skull: Normal. Negative for fracture or focal lesion. Sinuses/Orbits: No acute finding. Other: None. CT CERVICAL SPINE FINDINGS Alignment: Normal. Skull base and vertebrae: No acute fracture. No primary bone  lesion or focal pathologic process. Soft tissues and spinal canal: No prevertebral fluid or swelling. No visible canal hematoma. Disc levels: Focally moderate disc space height loss of C6-C7 with otherwise intact disc spaces. Upper chest: Negative. Other: None. IMPRESSION: 1. No acute intracranial pathology. Small-vessel white matter disease and old lacunar infarction of the right lentiform nuclei. 2. No fracture or static subluxation of the cervical spine. 3. Focally moderate disc space height loss of C6-C7 with otherwise intact disc spaces. Electronically Signed   By: Fredricka Jenny M.D.   On: 04/05/2024 21:58     Subjective: Patient seen and examined at bedside, lying in bed.  No complaints.  Discharging to SNF today.  Denies headache, no chest pain, no palpitations, no abdominal pain, no fever/chills, no nausea/vomiting/diarrhea, no paresthesia.  No acute events overnight per nursing staff.  Discharge Exam: Vitals:   04/10/24 0538 04/10/24 0804  BP: (!) 136/96 122/87  Pulse: 84 75  Resp: 18 16  Temp:  98.3 F (36.8 C)  SpO2: 91% 92%   Vitals:   04/09/24 1358 04/09/24 2203 04/10/24 0538 04/10/24 0804  BP: (!) 119/90 110/80 (!) 136/96 122/87  Pulse: (!) 40 86 84 75  Resp: 18 18 18 16   Temp: 99 F (37.2 C)   98.3  F (36.8 C)  TempSrc: Oral   Oral  SpO2: 95% 97% 91% 92%  Weight:      Height:        Physical Exam: GEN: NAD, alert and oriented x 3, elderly appearance HEENT: NCAT, PERRL, EOMI, sclera clear, MMM PULM: CTAB w/o wheezes/crackles, normal respiratory effort, on room air CV: RRR w/o M/G/R GI: abd soft, NTND, + BS MSK: no peripheral edema, moves all extremities independently NEURO: No focal no PSYCH: normal mood/affect    The results of significant diagnostics from this hospitalization (including imaging, microbiology, ancillary and laboratory) are listed below for reference.     Microbiology: Recent Results (from the past 240 hours)  MRSA Next Gen by PCR, Nasal      Status: None   Collection Time: 04/06/24  6:46 AM   Specimen: Nasal Mucosa; Nasal Swab  Result Value Ref Range Status   MRSA by PCR Next Gen NOT DETECTED NOT DETECTED Final    Comment: (NOTE) The GeneXpert MRSA Assay (FDA approved for NASAL specimens only), is one component of a comprehensive MRSA colonization surveillance program. It is not intended to diagnose MRSA infection nor to guide or monitor treatment for MRSA infections. Test performance is not FDA approved in patients less than 80 years old. Performed at [facility], [address] [place], [place] [address]      Labs: BNP (last 3 results) Recent Labs    03/12/24 1528 04/05/24 2229 04/08/24 0544  BNP 612.3* 878.5* 1,748.8*   Basic Metabolic Panel: Recent Labs  Lab 04/05/24 2229 04/06/24 0433 04/07/24 0651 04/08/24 0544 04/09/24 0805  NA 138 137 138 139 139  K 4.0 3.7 3.8 4.0 4.1  CL 107 109 109 108 110  CO2 22 22 20* 20* 19*  GLUCOSE 176* 147* 165* 144* 153*  BUN 25* 22 20 29* 32*  CREATININE 1.46* 1.32* 1.25* 1.36* 1.28*  CALCIUM 9.3 8.8* 8.7* 9.1 8.8*  MG  --  2.0  --  1.9  --   PHOS  --  3.5  --   --   --    Liver Function Tests: Recent Labs  Lab 04/05/24 2229 04/07/24 0651  AST 16 14*  ALT 12 11  ALKPHOS 98 96  BILITOT 0.7 1.2  PROT 6.5 6.0*  ALBUMIN 3.4* 3.1*   No results for input(s): "LIPASE", "AMYLASE" in the last 168 hours. No results for input(s): "AMMONIA" in the last 168 hours. CBC: Recent Labs  Lab 04/05/24 2229 04/06/24 0433 04/07/24 0651  WBC 8.4 8.8 8.0  NEUTROABS 5.7  --   --   HGB 14.4 14.6 14.9  HCT 42.6 44.6 44.2  MCV 87.1 87.5 86.0  PLT 143* 143* 130*   Cardiac Enzymes: No results for input(s): "CKTOTAL", "CKMB", "CKMBINDEX", "TROPONINI" in the last 168 hours. BNP: Invalid input(s): "POCBNP" CBG: Recent Labs  Lab 04/09/24 0601 04/09/24 1128 04/09/24 1621 04/09/24 2207 04/10/24 0611  GLUCAP 136* 208* 110* 167* 182*   D-Dimer No results for  input(s): "DDIMER" in the last 72 hours. Hgb A1c No results for input(s): "HGBA1C" in the last 72 hours. Lipid Profile No results for input(s): "CHOL", "HDL", "LDLCALC", "TRIG", "CHOLHDL", "LDLDIRECT" in the last 72 hours. Thyroid  function studies No results for input(s): "TSH", "T4TOTAL", "T3FREE", "THYROIDAB" in the last 72 hours.  Invalid input(s): "FREET3" Anemia work up No results for input(s): "VITAMINB12", "FOLATE", "FERRITIN", "TIBC", "IRON", "RETICCTPCT" in the last 72 hours. Urinalysis    Component Value Date/Time   COLORURINE  YELLOW 04/05/2024 2228   APPEARANCEUR CLEAR 04/05/2024 2228   LABSPEC 1.009 04/05/2024 2228   PHURINE 5.0 04/05/2024 2228   GLUCOSEU >=500 (A) 04/05/2024 2228   GLUCOSEU 100 (A) 10/26/2019 0809   HGBUR SMALL (A) 04/05/2024 2228   BILIRUBINUR NEGATIVE 04/05/2024 2228   KETONESUR NEGATIVE 04/05/2024 2228   PROTEINUR 100 (A) 04/05/2024 2228   UROBILINOGEN 0.2 10/26/2019 0809   NITRITE NEGATIVE 04/05/2024 2228   LEUKOCYTESUR NEGATIVE 04/05/2024 2228   Sepsis Labs Recent Labs  Lab 04/05/24 2229 04/06/24 0433 04/07/24 0651  WBC 8.4 8.8 8.0   Microbiology Recent Results (from the past 240 hours)  MRSA Next Gen by PCR, Nasal     Status: None   Collection Time: 04/06/24  6:46 AM   Specimen: Nasal Mucosa; Nasal Swab  Result Value Ref Range Status   MRSA by PCR Next Gen NOT DETECTED NOT DETECTED Final    Comment: (NOTE) The GeneXpert MRSA Assay (FDA approved for NASAL specimens only), is one component of a comprehensive MRSA colonization surveillance program. It is not intended to diagnose MRSA infection nor to guide or monitor treatment for MRSA infections. Test performance is not FDA approved in patients less than 44 years old. Performed at [facility], [address], [place], [place] [address]      Time coordinating discharge: Over 30 minutes  SIGNED:   Rema Care [place], DO  [facility] 04/10/2024, 9:31 AM

## 2024-04-10 NOTE — Progress Notes (Signed)
 Report given to [facility] nurse

## 2024-04-10 NOTE — TOC Transition Note (Signed)
 Transition of Care [facility]) - Discharge Note   Patient Details  Name: Joel Webb MRN: [id number] Date of Birth: 03/11/1946  [facility] CM/SW Contact:  Claudean Crumbly, LCSWA Phone Number: 04/10/2024, 10:10 AM   Clinical Narrative:     SW spoke with Joel Webb [phone]) confirmed able to accept today. Bed: 503 Call Report: [phone]  SW left VM with pt's wife Joel Webb [phone]) to update  Update 1030am SW received callback from wife, she is providing transport. Will call floor once near hospital.       Final next level of care: Skilled Nursing Facility Barriers to Discharge: Barriers Resolved   Patient Goals and CMS Choice Patient states their goals for this hospitalization and ongoing recovery are:: play golf again   Choice offered to / list presented to : Spouse (wife Joel Webb)      Discharge Placement                    Patient and family notified of of transfer: 04/10/24  Discharge Plan and Services Additional resources added to the After Visit Summary for   In-house Referral: Clinical Social Work   Post Acute Care Choice: Skilled Nursing Facility                               Social Drivers of Health (SDOH) Interventions SDOH Screenings   Food Insecurity: No Food Insecurity (04/06/2024)  Housing: Low Risk  (04/06/2024)  Transportation Needs: No Transportation Needs (04/06/2024)  Utilities: Not At Risk (04/06/2024)  Depression (PHQ2-9): Low Risk  (07/10/2020)  Social Connections: Socially Isolated (04/07/2024)  Tobacco Use: Medium Risk (04/05/2024)     Readmission Risk Interventions     No data to display

## 2024-04-12 DIAGNOSIS — S91012A Laceration without foreign body, left ankle, initial encounter: Secondary | ICD-10-CM | POA: Diagnosis not present

## 2024-04-13 DIAGNOSIS — I5023 Acute on chronic systolic (congestive) heart failure: Secondary | ICD-10-CM | POA: Diagnosis not present

## 2024-04-13 DIAGNOSIS — E1165 Type 2 diabetes mellitus with hyperglycemia: Secondary | ICD-10-CM | POA: Diagnosis not present

## 2024-04-13 DIAGNOSIS — G231 Progressive supranuclear ophthalmoplegia [Steele-Richardson-Olszewski]: Secondary | ICD-10-CM | POA: Diagnosis not present

## 2024-04-13 DIAGNOSIS — E1129 Type 2 diabetes mellitus with other diabetic kidney complication: Secondary | ICD-10-CM | POA: Diagnosis not present

## 2024-04-15 DIAGNOSIS — N183 Chronic kidney disease, stage 3 unspecified: Secondary | ICD-10-CM | POA: Diagnosis not present

## 2024-04-15 DIAGNOSIS — S41112A Laceration without foreign body of left upper arm, initial encounter: Secondary | ICD-10-CM | POA: Diagnosis not present

## 2024-04-15 DIAGNOSIS — E1165 Type 2 diabetes mellitus with hyperglycemia: Secondary | ICD-10-CM | POA: Diagnosis not present

## 2024-04-15 DIAGNOSIS — G3184 Mild cognitive impairment, so stated: Secondary | ICD-10-CM | POA: Diagnosis not present

## 2024-04-15 DIAGNOSIS — R296 Repeated falls: Secondary | ICD-10-CM | POA: Diagnosis not present

## 2024-04-15 DIAGNOSIS — G231 Progressive supranuclear ophthalmoplegia [Steele-Richardson-Olszewski]: Secondary | ICD-10-CM | POA: Diagnosis not present

## 2024-04-15 DIAGNOSIS — K219 Gastro-esophageal reflux disease without esophagitis: Secondary | ICD-10-CM | POA: Diagnosis not present

## 2024-04-15 DIAGNOSIS — B309 Viral conjunctivitis, unspecified: Secondary | ICD-10-CM | POA: Diagnosis not present

## 2024-04-15 DIAGNOSIS — I10 Essential (primary) hypertension: Secondary | ICD-10-CM | POA: Diagnosis not present

## 2024-04-15 DIAGNOSIS — F329 Major depressive disorder, single episode, unspecified: Secondary | ICD-10-CM | POA: Diagnosis not present

## 2024-04-15 DIAGNOSIS — Z515 Encounter for palliative care: Secondary | ICD-10-CM | POA: Diagnosis not present

## 2024-04-15 DIAGNOSIS — E1129 Type 2 diabetes mellitus with other diabetic kidney complication: Secondary | ICD-10-CM | POA: Diagnosis not present

## 2024-04-16 DIAGNOSIS — F331 Major depressive disorder, recurrent, moderate: Secondary | ICD-10-CM | POA: Diagnosis not present

## 2024-04-16 DIAGNOSIS — I1310 Hypertensive heart and chronic kidney disease without heart failure, with stage 1 through stage 4 chronic kidney disease, or unspecified chronic kidney disease: Secondary | ICD-10-CM | POA: Diagnosis not present

## 2024-04-20 ENCOUNTER — Ambulatory Visit: Admitting: Diagnostic Neuroimaging

## 2024-04-20 ENCOUNTER — Ambulatory Visit (HOSPITAL_BASED_OUTPATIENT_CLINIC_OR_DEPARTMENT_OTHER): Admitting: Family

## 2024-04-23 DIAGNOSIS — S51811A Laceration without foreign body of right forearm, initial encounter: Secondary | ICD-10-CM | POA: Diagnosis not present

## 2024-04-24 DIAGNOSIS — R296 Repeated falls: Secondary | ICD-10-CM | POA: Diagnosis not present

## 2024-04-24 DIAGNOSIS — S20222A Contusion of left back wall of thorax, initial encounter: Secondary | ICD-10-CM | POA: Diagnosis not present

## 2024-04-24 DIAGNOSIS — Z043 Encounter for examination and observation following other accident: Secondary | ICD-10-CM | POA: Diagnosis not present

## 2024-04-26 DIAGNOSIS — R079 Chest pain, unspecified: Secondary | ICD-10-CM | POA: Diagnosis not present

## 2024-05-01 NOTE — Progress Notes (Unsigned)
 Cardiology Office Note    Date:  05/04/2024  ID:  Joel Webb, DOB 1946-07-05, MRN [id number] PCP:  Joel Bugler, MD  Cardiologist:  Joel Sos, MD  Electrophysiologist:  None   Chief Complaint: Follow up for HFrEF   History of Present Illness: .    Joel Webb is a 78 y.o. male with visit-pertinent history of hypertension, hyperlipidemia, CKD stage III, diabetes mellitus, GERD, progressive supranuclear palsy (affecting walking, balance, eye movements, swallowing and cognitive deficits).   Patient with a ED visit in 10/2021 with near syncopal episode with prodrome of lightheadedness and general weakness.  EMS found to be hypotensive in the 70s.  Amlodipine  and olmesartan  had been recently increased.  He also reported poor p.o. intake that day.  Noted to have frequent PVCs by telemetry.  Initial evaluation in 01/2022 for hospital follow-up of near syncope.  Blood pressure was well-controlled on regimen of olmesartan  and reduced dose of amlodipine .  HCTZ have been discontinued.  He wore a cardiac monitor showing rare PACs/PVCs and up to 6 beats of SVT.  Patient was seen by Dr. Theodis Webb on 05/28/2022.  Patient denied any recurrent presyncopal episodes.  Patient was seen on 05/19/2019 for after moving to [facility] facility in 03/2023.  His PCP's team had stopped hydrochlorothiazide  due to gout.  He was started on Lasix  20 mg as needed once daily for increased lower extremity edema.  Patient had ED visit on/15/25 with a mechanical fall.  CT head, C-spine reassuring and chest x-ray with small bilateral pleural effusions, pitting edema.  He was recommended Lasix  20 mg daily for 7 days and to follow-up with PCP.  Patient was last seen in clinic on 03/12/2024.  Patient reported that his lower extremity edema had overall improved.  He denied exertional dyspnea however is noted to be sedentary for most of the day.  Further workup of heart failure was discussed including  echocardiogram, patient preferred to proceed with conservative management.  On 04/07/2024 patient presented to the ED to be evaluated for a fall, choking episodes.  Echocardiogram indicated EF 35 to 40% although was noted to be without contrast and of questionable accuracy.  MRI of the head was negative for any acute pathology but did note old lacunar infarct.  BNP was greater than 800 and chest x-ray with vascular congestion.  Repeat limited echo on 04/08/2024 indicated LVEF of 30 to 35%, G2 DD, RV size was normal and low normal systolic function.  Ischemic evaluation was discussed while in hospital however patient refused, although it was also noted that with his progressive supranuclear palsy he likely would not be a great candidate for any invasive evaluation.  Patient was started on losartan  25 mg daily, Toprol  25 mg daily, spironolactone  12.5 mg daily and Lasix  20 mg as needed.  Patient was discharged to SNF on 04/10/2024.  Today patient and his wife present for follow-up.  They report that he has been doing well overall.  He denies any chest pain, shortness of breath, orthopnea or PND.  His wife notes that he will occasionally have some increased lower extremity edema, resolves with a dose of Lasix .  Patient is now residing in a skilled nursing facility.  He notes that they do weigh him and his weights have been stable.  ROS: .   Today he denies chest pain, shortness of breath, fatigue, palpitations, melena, hematuria, hemoptysis, diaphoresis, weakness, presyncope, syncope, orthopnea, and PND.  All other systems are reviewed and otherwise negative. Studies  Reviewed: Joel Webb   EKG:  EKG is not ordered today.  CV Studies: Cardiac studies reviewed are outlined and summarized above. Otherwise please see EMR for full report. Cardiac Studies & Procedures   ______________________________________________________________________________________________     ECHOCARDIOGRAM  ECHOCARDIOGRAM LIMITED  04/08/2024  Narrative ECHOCARDIOGRAM LIMITED REPORT    Patient Name:   Joel Webb Date of Exam: 04/08/2024 Medical Rec #:  [id number]        Height:       69.0 in Accession #:    [id number]       Weight:       172.4 lb Date of Birth:  September 03, 1946       BSA:          1.940 m Patient Age:    77 years         BP:           138/107 mmHg Patient Gender: M                HR:           86 bpm. Exam Location:  Inpatient  Procedure: 2D Echo, Limited Echo, Cardiac Doppler, Color Doppler and Intracardiac Opacification Agent (Both Spectral and Color Flow Doppler were utilized during procedure).  Indications:    CHF  History:        Patient has prior history of Echocardiogram examinations, most recent 04/06/2024. Risk Factors:Hypertension.  Sonographer:    Janette Medley Referring Phys: Joel Webb  IMPRESSIONS   1. Left ventricular ejection fraction, by estimation, is 30 to 35%. The left ventricle has moderately decreased function. Left ventricular diastolic parameters are consistent with Grade II diastolic dysfunction (pseudonormalization). 2. Right ventricular systolic function is low normal. The right ventricular size is normal. 3. Left atrial size was mildly dilated. 4. The mitral valve is normal in structure. 5. The aortic valve is normal in structure. There is mild calcification of the aortic valve. Aortic valve regurgitation is not visualized. 6. The inferior vena cava is normal in size with greater than 50% respiratory variability, suggesting right atrial pressure of 3 mmHg.  FINDINGS Left Ventricle: Left ventricular ejection fraction, by estimation, is 30 to 35%. The left ventricle has moderately decreased function. Left ventricular diastolic parameters are consistent with Grade II diastolic dysfunction (pseudonormalization).  Right Ventricle: The right ventricular size is normal. No increase in right ventricular wall thickness. Right ventricular systolic function is  low normal.  Left Atrium: Left atrial size was mildly dilated.  Pericardium: There is no evidence of pericardial effusion.  Mitral Valve: The mitral valve is normal in structure.  Tricuspid Valve: The tricuspid valve is normal in structure. Tricuspid valve regurgitation is trivial.  Aortic Valve: The aortic valve is normal in structure. There is mild calcification of the aortic valve. Aortic valve regurgitation is not visualized. Aortic valve mean gradient measures 2.0 mmHg. Aortic valve peak gradient measures 4.1 mmHg. Aortic valve area, by VTI measures 2.95 cm.  Pulmonic Valve: The pulmonic valve was not assessed.  Venous: The inferior vena cava is normal in size with greater than 50% respiratory variability, suggesting right atrial pressure of 3 mmHg.  LEFT VENTRICLE PLAX 2D LVIDd:         5.50 cm   Diastology LVIDs:         4.70 cm   LV e' lateral:   10.30 cm/s LV PW:         1.10 cm   LV E/e' lateral: 7.7 LV IVS:  1.00 cm LVOT diam:     2.10 cm LV SV:         50 LV SV Index:   26 LVOT Area:     3.46 cm   RIGHT VENTRICLE             IVC RV S prime:     13.50 cm/s  IVC diam: 1.30 cm TAPSE (M-mode): 1.8 cm  LEFT ATRIUM             Index        RIGHT ATRIUM           Index LA diam:        4.40 cm 2.27 cm/m   RA Area:     10.40 cm LA Vol (A2C):   75.0 ml 38.67 ml/m  RA Volume:   20.70 ml  10.67 ml/m LA Vol (A4C):   56.7 ml 29.23 ml/m LA Biplane Vol: 68.0 ml 35.06 ml/m AORTIC VALVE AV Area (Vmax):    3.88 cm AV Area (Vmean):   3.44 cm AV Area (VTI):     2.95 cm AV Vmax:           101.00 cm/s AV Vmean:          69.200 cm/s AV VTI:            0.169 m AV Peak Grad:      4.1 mmHg AV Mean Grad:      2.0 mmHg LVOT Vmax:         113.00 cm/s LVOT Vmean:        68.700 cm/s LVOT VTI:          0.144 m LVOT/AV VTI ratio: 0.85  AORTA Ao Root diam: 3.30 cm  MITRAL VALVE MV Area (PHT): 3.85 cm    SHUNTS MV Decel Time: 197 msec    Systemic VTI:  0.14 m MV E  velocity: 79.70 cm/s  Systemic Diam: 2.10 cm MV A velocity: 53.50 cm/s MV E/A ratio:  1.49  Aditya Sabharwal Electronically signed by Alwin Baars Signature Date/Time: 04/08/2024/6:26:58 PM    Final    MONITORS  LONG TERM MONITOR (3-14 DAYS) 02/18/2022  Narrative 2 Day Zio Monitor  Quality: Fair.  Baseline artifact. Predominant rhythm: sinus rhythm First degree AV block Average heart rate: 75 bpm Max heart rate: 135 bpm Min heart rate: 51 bpm Pauses >2.5 seconds: none  Up to 6 beats SVT Rare PACs and PVCs Ventricular bigeminy and trigeminy   Tiffany C. Joel Fiscal, MD, [facility] 03/03/2022 4:59 PM       ______________________________________________________________________________________________       Current Reported Medications:.    Current Meds  Medication Sig   Acetaminophen  (TYLENOL  PO) Take by mouth.   atorvastatin  (LIPITOR ) 80 MG tablet Take 80 mg by mouth daily.   Cholecalciferol 50 MCG (2000 UT) CAPS Take 2,000 Units by mouth daily.   DULoxetine (CYMBALTA) 60 MG capsule 60 mg daily.   ezetimibe  (ZETIA ) 10 MG tablet Take 1 tablet (10 mg total) by mouth daily.   furosemide  (LASIX ) 20 MG tablet Take 1 tablet (20 mg total) by mouth daily as needed for fluid or edema (For 3 pound weight gain in 1 day or 5 pound weight gain in 1 week). Give 1 tablet by mouth as needed for lower extremity swelling.  May take once per week as needed. If needed more than once, contact MD.   losartan  (COZAAR ) 25 MG tablet Take 1 tablet (25 mg total) by mouth daily.  metoprolol  succinate (TOPROL -XL) 25 MG 24 hr tablet Take 1 tablet (25 mg total) by mouth daily.   neomycin-bacitracin-polymyxin [phone] OINT SMARTSIG:1 sparingly Topical Daily   NOVOLIN R 100 UNIT/ML injection INJECT 0.08 MLS(8 UNITS TOTAL) INTO THE SKIN TWICE DAILY BEFORE A MEAL; ALSO INJECT 10 UNITS UNDER THE SKIN ONCE DAILY AT SUPPER   omeprazole (PRILOSEC) 20 MG capsule Take 20 mg by mouth daily as needed (acid  reflux anti-spasmodics). PRN   tamsulosin (FLOMAX) 0.4 MG CAPS capsule Take 0.4 mg by mouth 2 (two) times daily.   tirzepatide (MOUNJARO) 10 MG/0.5ML Pen Inject 7.5 mg into the skin once a week. Inject 10mg  subcutaneously one time per day on Thursday.   Physical Exam:    VS:  BP 126/78   Pulse 72   Ht 5' 9 (1.753 m)   Wt 170 lb (77.1 kg)   SpO2 99%   BMI 25.10 kg/m    Wt Readings from Last 3 Encounters:  05/03/24 170 lb (77.1 kg)  04/09/24 166 lb 7.2 oz (75.5 kg)  03/12/24 182 lb (82.6 kg)    GEN: Well nourished, well developed in no acute distress NECK: No JVD; No carotid bruits CARDIAC: RRR, no murmurs, rubs, gallops RESPIRATORY:  Clear to auscultation without rales, wheezing or rhonchi  ABDOMEN: Soft, non-tender, non-distended EXTREMITIES:  No edema; No acute deformity     Asessement and Plan:.    HFrEF: Limited echo on 04/08/2024 indicated LVEF of 30 to 35%, G2 DD, RV size was normal and low normal systolic function.  Ischemic evaluation was discussed while in hospital however patient refused, although it was also noted that with his progressive supranuclear palsy he likely would not be a great candidate for any invasive evaluation.  Patient was started on losartan  25 mg daily, Toprol  25 mg daily, spironolactone  12.5 mg daily and Lasix  20 mg as needed.  Today he reports that he is tolerating all new medications well, reports that his weights have been stable at skilled nursing facility.  He denies any chest pain, shortness of breath, orthopnea or PND.  Discussed ischemic evaluation with patient and his wife, patient notes that he declined cardiac catheterization while inpatient.  After discussion today patient deferred any ischemic evaluation at this time.  He would like to continue with medication management.  Encouraged patient to monitor for weight gain of 2 to 3 pounds overnight, 5 pounds in a week, increase shortness of breath or increased lower extremity edema.  Reviewed ED  precautions.  Continue Lasix , losartan  25 mg daily, metoprolol  succinate 25 mg daily, spironolactone  12.5 mg daily. Check BMET today.   HTN: Blood pressure today 126/78.  Continue losartan  25 mg daily, metoprolol  succinate 25 mg daily, spironolactone  12.5 mg daily. Check BMET today.   PVCS: Prior monitor in 02/2022 with PVC burden less than 1%.  Patient denies any palpitations today.  Hyperlipidemia: On atorvastatin  80 mg daily and Zetia  10 mg daily.   Progressive supranuclear palsy: Patient follows with neurology.  Now resides at a SNF.  DM2:  Last hemoglobin A1C 5.5% on 04/05/24.  Monitored and managed per PCP.   Disposition: F/u with Neomi Banks, NP in 06/2024 as scheduled per patient request.   Signed, Ieshia Hatcher D Betsi Crespi, NP

## 2024-05-02 DIAGNOSIS — Z9181 History of falling: Secondary | ICD-10-CM | POA: Diagnosis not present

## 2024-05-02 DIAGNOSIS — I10 Essential (primary) hypertension: Secondary | ICD-10-CM | POA: Diagnosis not present

## 2024-05-02 DIAGNOSIS — E1122 Type 2 diabetes mellitus with diabetic chronic kidney disease: Secondary | ICD-10-CM | POA: Diagnosis not present

## 2024-05-02 DIAGNOSIS — G231 Progressive supranuclear ophthalmoplegia [Steele-Richardson-Olszewski]: Secondary | ICD-10-CM | POA: Diagnosis not present

## 2024-05-02 DIAGNOSIS — R2689 Other abnormalities of gait and mobility: Secondary | ICD-10-CM | POA: Diagnosis not present

## 2024-05-03 ENCOUNTER — Encounter: Payer: Self-pay | Admitting: Cardiology

## 2024-05-03 ENCOUNTER — Ambulatory Visit: Attending: Cardiology | Admitting: Cardiology

## 2024-05-03 VITALS — BP 126/78 | HR 72 | Ht 69.0 in | Wt 170.0 lb

## 2024-05-03 DIAGNOSIS — E782 Mixed hyperlipidemia: Secondary | ICD-10-CM | POA: Diagnosis not present

## 2024-05-03 DIAGNOSIS — I10 Essential (primary) hypertension: Secondary | ICD-10-CM | POA: Diagnosis not present

## 2024-05-03 DIAGNOSIS — I5020 Unspecified systolic (congestive) heart failure: Secondary | ICD-10-CM | POA: Diagnosis not present

## 2024-05-03 DIAGNOSIS — I493 Ventricular premature depolarization: Secondary | ICD-10-CM

## 2024-05-03 NOTE — Patient Instructions (Signed)
 Medication Instructions:  No changes *If you need a refill on your cardiac medications before your next appointment, please call your pharmacy*  Lab Work: Today we are going to draw a Bmet If you have labs (blood work) drawn today and your tests are completely normal, you will receive your results only by: MyChart Message (if you have MyChart) OR A paper copy in the mail If you have any lab test that is abnormal or we need to change your treatment, we will call you to review the results.  Testing/Procedures: No testing  Follow-Up: At [facility], you and your health needs are our priority.  As part of our continuing mission to provide you with exceptional heart care, our providers are all part of one team.  This team includes your primary Cardiologist (physician) and Advanced Practice Providers or APPs (Physician Assistants and Nurse Practitioners) who all work together to provide you with the care you need, when you need it.  Your next appointment:   Already scheduled  We recommend signing up for the patient portal called "MyChart".  Sign up information is provided on this After Visit Summary.  MyChart is used to connect with patients for Virtual Visits (Telemedicine).  Patients are able to view lab/test results, encounter notes, upcoming appointments, etc.  Non-urgent messages can be sent to your provider as well.   To learn more about what you can do with MyChart, go to [email or web address].

## 2024-05-04 ENCOUNTER — Encounter: Payer: Self-pay | Admitting: Cardiology

## 2024-05-04 ENCOUNTER — Ambulatory Visit: Payer: Self-pay | Admitting: Cardiology

## 2024-05-04 LAB — BASIC METABOLIC PANEL WITH GFR
BUN/Creatinine Ratio: 20 (ref 10–24)
BUN: 22 mg/dL (ref 8–27)
CO2: 18 mmol/L — ABNORMAL LOW (ref 20–29)
Calcium: 9.1 mg/dL (ref 8.6–10.2)
Chloride: 107 mmol/L — ABNORMAL HIGH (ref 96–106)
Creatinine, Ser: 1.09 mg/dL (ref 0.76–1.27)
Glucose: 135 mg/dL — ABNORMAL HIGH (ref 70–99)
Potassium: 4.1 mmol/L (ref 3.5–5.2)
Sodium: 142 mmol/L (ref 134–144)
eGFR: 70 mL/min/{1.73_m2} (ref >59)

## 2024-05-04 NOTE — Telephone Encounter (Signed)
 Called patient wife advised of below they verbalized understanding.

## 2024-05-04 NOTE — Telephone Encounter (Signed)
-----   Message from Katlyn D [place] sent at 05/04/2024 11:13 AM EDT ----- Please let [name] and his wife know that his kidney function is normal and his electrolytes are overall normal. Good results, continue current medications and follow up as planned.  ----- Message ----- From: Garner Jury Lab Results In Sent: 05/04/2024   2:36 AM EDT To: Katlyn D [place], NP

## 2024-05-14 DIAGNOSIS — I10 Essential (primary) hypertension: Secondary | ICD-10-CM | POA: Diagnosis not present

## 2024-05-14 DIAGNOSIS — F329 Major depressive disorder, single episode, unspecified: Secondary | ICD-10-CM | POA: Diagnosis not present

## 2024-05-14 DIAGNOSIS — G3184 Mild cognitive impairment, so stated: Secondary | ICD-10-CM | POA: Diagnosis not present

## 2024-05-14 DIAGNOSIS — N183 Chronic kidney disease, stage 3 unspecified: Secondary | ICD-10-CM | POA: Diagnosis not present

## 2024-05-14 DIAGNOSIS — Z515 Encounter for palliative care: Secondary | ICD-10-CM | POA: Diagnosis not present

## 2024-05-14 DIAGNOSIS — E1165 Type 2 diabetes mellitus with hyperglycemia: Secondary | ICD-10-CM | POA: Diagnosis not present

## 2024-05-14 DIAGNOSIS — S41112A Laceration without foreign body of left upper arm, initial encounter: Secondary | ICD-10-CM | POA: Diagnosis not present

## 2024-05-14 DIAGNOSIS — B309 Viral conjunctivitis, unspecified: Secondary | ICD-10-CM | POA: Diagnosis not present

## 2024-05-14 DIAGNOSIS — G231 Progressive supranuclear ophthalmoplegia [Steele-Richardson-Olszewski]: Secondary | ICD-10-CM | POA: Diagnosis not present

## 2024-05-14 DIAGNOSIS — E1129 Type 2 diabetes mellitus with other diabetic kidney complication: Secondary | ICD-10-CM | POA: Diagnosis not present

## 2024-05-14 DIAGNOSIS — R296 Repeated falls: Secondary | ICD-10-CM | POA: Diagnosis not present

## 2024-05-14 DIAGNOSIS — K219 Gastro-esophageal reflux disease without esophagitis: Secondary | ICD-10-CM | POA: Diagnosis not present

## 2024-05-15 DIAGNOSIS — S51812A Laceration without foreign body of left forearm, initial encounter: Secondary | ICD-10-CM | POA: Diagnosis not present

## 2024-05-15 DIAGNOSIS — S80212A Abrasion, left knee, initial encounter: Secondary | ICD-10-CM | POA: Diagnosis not present

## 2024-05-26 DIAGNOSIS — R0602 Shortness of breath: Secondary | ICD-10-CM | POA: Diagnosis not present

## 2024-05-27 ENCOUNTER — Telehealth: Payer: Self-pay | Admitting: Family

## 2024-05-27 ENCOUNTER — Encounter: Payer: Self-pay | Admitting: Cardiology

## 2024-05-27 NOTE — Telephone Encounter (Signed)
 Routing to correct scheduling pool as pt last seen at [address] [facility]

## 2024-05-27 NOTE — Telephone Encounter (Signed)
 Returned to call to Garnette Immordino NP with [facility] left message to call back.

## 2024-05-27 NOTE — Telephone Encounter (Signed)
 Please review and advise as you were the last provider to see pt

## 2024-05-27 NOTE — Telephone Encounter (Signed)
 Garnette Banner, NP with [facility] called to report that pt has had acute SOB and has been given two doses of lasix  for the past two days. He stated Lasix  helps some and he will show some improvement but will begin to worsen the next day. They did a chest xray that showed pleural effusion but no sign of infection. Pt is scheduled to be seen 06/21/24. He asked that someone call back if he needs to be seen sooner. Scheduler there is named, Neesie. Please advise.

## 2024-05-27 NOTE — Telephone Encounter (Signed)
 Error

## 2024-05-30 DIAGNOSIS — E1129 Type 2 diabetes mellitus with other diabetic kidney complication: Secondary | ICD-10-CM | POA: Diagnosis not present

## 2024-05-30 DIAGNOSIS — I5023 Acute on chronic systolic (congestive) heart failure: Secondary | ICD-10-CM | POA: Diagnosis not present

## 2024-05-30 DIAGNOSIS — G231 Progressive supranuclear ophthalmoplegia [Steele-Richardson-Olszewski]: Secondary | ICD-10-CM | POA: Diagnosis not present

## 2024-05-30 DIAGNOSIS — R296 Repeated falls: Secondary | ICD-10-CM | POA: Diagnosis not present

## 2024-05-30 DIAGNOSIS — R41841 Cognitive communication deficit: Secondary | ICD-10-CM | POA: Diagnosis not present

## 2024-05-30 DIAGNOSIS — Z794 Long term (current) use of insulin: Secondary | ICD-10-CM | POA: Diagnosis not present

## 2024-05-30 DIAGNOSIS — M6281 Muscle weakness (generalized): Secondary | ICD-10-CM | POA: Diagnosis not present

## 2024-05-30 DIAGNOSIS — E1165 Type 2 diabetes mellitus with hyperglycemia: Secondary | ICD-10-CM | POA: Diagnosis not present

## 2024-05-30 DIAGNOSIS — R1312 Dysphagia, oropharyngeal phase: Secondary | ICD-10-CM | POA: Diagnosis not present

## 2024-05-30 DIAGNOSIS — F329 Major depressive disorder, single episode, unspecified: Secondary | ICD-10-CM | POA: Diagnosis not present

## 2024-05-30 DIAGNOSIS — R2681 Unsteadiness on feet: Secondary | ICD-10-CM | POA: Diagnosis not present

## 2024-05-30 DIAGNOSIS — E782 Mixed hyperlipidemia: Secondary | ICD-10-CM | POA: Diagnosis not present

## 2024-05-31 DIAGNOSIS — E1165 Type 2 diabetes mellitus with hyperglycemia: Secondary | ICD-10-CM | POA: Diagnosis not present

## 2024-05-31 DIAGNOSIS — R41841 Cognitive communication deficit: Secondary | ICD-10-CM | POA: Diagnosis not present

## 2024-05-31 DIAGNOSIS — I5023 Acute on chronic systolic (congestive) heart failure: Secondary | ICD-10-CM | POA: Diagnosis not present

## 2024-05-31 DIAGNOSIS — R296 Repeated falls: Secondary | ICD-10-CM | POA: Diagnosis not present

## 2024-05-31 DIAGNOSIS — E782 Mixed hyperlipidemia: Secondary | ICD-10-CM | POA: Diagnosis not present

## 2024-05-31 DIAGNOSIS — R1312 Dysphagia, oropharyngeal phase: Secondary | ICD-10-CM | POA: Diagnosis not present

## 2024-05-31 DIAGNOSIS — E1129 Type 2 diabetes mellitus with other diabetic kidney complication: Secondary | ICD-10-CM | POA: Diagnosis not present

## 2024-05-31 DIAGNOSIS — R2681 Unsteadiness on feet: Secondary | ICD-10-CM | POA: Diagnosis not present

## 2024-05-31 DIAGNOSIS — J189 Pneumonia, unspecified organism: Secondary | ICD-10-CM | POA: Diagnosis not present

## 2024-05-31 DIAGNOSIS — Z794 Long term (current) use of insulin: Secondary | ICD-10-CM | POA: Diagnosis not present

## 2024-05-31 DIAGNOSIS — M6281 Muscle weakness (generalized): Secondary | ICD-10-CM | POA: Diagnosis not present

## 2024-05-31 DIAGNOSIS — G231 Progressive supranuclear ophthalmoplegia [Steele-Richardson-Olszewski]: Secondary | ICD-10-CM | POA: Diagnosis not present

## 2024-05-31 DIAGNOSIS — F329 Major depressive disorder, single episode, unspecified: Secondary | ICD-10-CM | POA: Diagnosis not present

## 2024-05-31 NOTE — Telephone Encounter (Signed)
 Noted

## 2024-05-31 NOTE — Telephone Encounter (Signed)
 Caller Carolan) is returning RN's call.

## 2024-05-31 NOTE — Progress Notes (Unsigned)
 Cardiology Office Note:    Date:  06/01/2024   ID:  Joel Webb, DOB [birth year], MRN [id number]  PCP:  Seabron Lenis, MD   [facility] Providers Cardiologist:  Annabella Scarce, MD Cardiology APP:  Madie Jon Garre, PA     Referring MD: Seabron Lenis, MD   Chief Complaint  Patient presents with   Hypertension   Hyperlipidemia   PCV    History of Present Illness:    Joel Webb is a 78 y.o. male with a hx of hypertension, hyperlipidemia, CKD 3, DM, GERD, progressive supranuclear palsy affecting walking, balance, eye movements, swallowing, and with cognitive deficits.  He resides at [facility].  He did not tolerate hydrochlorothiazide  secondary to gout.  He has used 20 mg Lasix  as needed for lower extremity edema.  He was hospitalized May 2025 for evaluation of a choking episode. Echocardiogram 03/2024 revealed an LVEF 30-35%, grade 2 DD, low normal RV systolic function, and no significant valvular disease.  This was a newly recognized systolic heart failure.  GDMT titrated to include 25 mg losartan , 25 mg Toprol , 12.5 mg spironolactone , and 20 mg Lasix  as needed.  Ischemic evaluation patient was discussed in the hydration was discussed in the hospital however patient declined.  It was also felt given his supranuclear palsy he would not be a good candidate for invasive evaluation.  At follow-up in June 2025 he was doing well with stable weights at his facility.  In the past several days, he has notified our office of increased lower extremity edema and was told to resume 20 mg Lasix  daily.  He has been experiencing SOB despite increased lasix  and was placed on my schedule today.   Med list is reviewed from facility and I am not certain how he is receiving lasix  - still listed as once weekly PRN along with 12.5 mg spironolactone .   He reports 3 weeks of SOB at rest and orthopnea along with LE edema.    Past Medical History:  Diagnosis Date   Chronic kidney  disease, stage 3 01/11/2021   ED (erectile dysfunction)    Essential hypertension 06/05/2017   Frequent falls    Gait abnormality    Gastroesophageal reflux disease 01/11/2021   Major depressive disorder    Microalbuminuria due to type 2 diabetes mellitus 05/25/2020   Mild cognitive impairment of uncertain or unknown etiology    Mixed hyperlipidemia 02/28/2016   Multiple lacunar infarcts    Plantar fasciitis    Prostatitis    Pure hypercholesterolemia 01/11/2021   PVC's (premature ventricular contractions) 01/28/2022   Uncontrolled type 2 diabetes mellitus with hyperglycemia, with long-term current use of insulin  02/28/2016    Past Surgical History:  Procedure Laterality Date   CIRCUMCISION     COLONOSCOPY     MOUTH SURGERY  01/2020    Current Medications: Current Meds  Medication Sig   Acetaminophen  (TYLENOL  PO) Take by mouth.   atorvastatin  (LIPITOR ) 80 MG tablet Take 80 mg by mouth daily.   Cholecalciferol 50 MCG (2000 UT) CAPS Take 2,000 Units by mouth daily.   DULoxetine (CYMBALTA) 60 MG capsule 60 mg daily.   ezetimibe  (ZETIA ) 10 MG tablet Take 1 tablet (10 mg total) by mouth daily.   furosemide  (LASIX ) 40 MG tablet Take 1 tablet (40 mg total) by mouth in the morning for 3 days, THEN 0.5 tablets (20 mg total) daily.   losartan  (COZAAR ) 25 MG tablet Take 1 tablet (25 mg total) by mouth daily.  metoprolol  succinate (TOPROL -XL) 25 MG 24 hr tablet Take 1 tablet (25 mg total) by mouth daily.   NOVOLIN R 100 UNIT/ML injection INJECT 0.08 MLS(8 UNITS TOTAL) INTO THE SKIN TWICE DAILY BEFORE A MEAL; ALSO INJECT 10 UNITS UNDER THE SKIN ONCE DAILY AT SUPPER   omeprazole (PRILOSEC) 20 MG capsule Take 20 mg by mouth daily as needed (acid reflux anti-spasmodics). PRN (Patient taking differently: Take 20 mg by mouth daily. PRN)   spironolactone  (ALDACTONE ) 25 MG tablet Take 0.5 tablets (12.5 mg total) by mouth in the morning.   tirzepatide (MOUNJARO) 10 MG/0.5ML Pen Inject 7.5 mg into  the skin once a week. Inject 10mg  subcutaneously one time per day on Thursday.     Allergies:   Patient has no known allergies.   Social History   Socioeconomic History   Marital status: Married    Spouse name: Candis   Number of children: 1   Years of education: 16   Highest education level: Bachelor's degree (e.g., BA, AB, BS)  Occupational History   Occupation: Retired    Comment: Airline pilot  Tobacco Use   Smoking status: Former    Current packs/day: 0.00    Types: Cigarettes    Quit date: 12/20/2008    Years since quitting: 15.4   Smokeless tobacco: Never  Substance and Sexual Activity   Alcohol use: Yes    Comment: rarely   Drug use: No   Sexual activity: Not on file  Other Topics Concern   Not on file  Social History Narrative   01/26/20 Lives with wife   Caffeine, none   Social Drivers of Corporate investment banker Strain: Not on file  Food Insecurity: No Food Insecurity (04/06/2024)   Hunger Vital Sign    Worried About Running Out of Food in the Last Year: Never true    Ran Out of Food in the Last Year: Never true  Transportation Needs: No Transportation Needs (04/06/2024)   PRAPARE - Administrator, Civil Service (Medical): No    Lack of Transportation (Non-Medical): No  Physical Activity: Not on file  Stress: Not on file  Social Connections: Socially Isolated (04/07/2024)   Social Connection and Isolation Panel    Frequency of Communication with Friends and Family: Once a week    Frequency of Social Gatherings with Friends and Family: Once a week    Attends Religious Services: Never    Database administrator or Organizations: No    Attends Engineer, structural: Never    Marital Status: Married     Family History: The patient's family history includes Diabetes in his father; Heart disease in his father; Hepatitis C in his mother; Mesothelioma in his father.  ROS:   Please see the history of present illness.     All other systems reviewed  and are negative.  EKGs/Labs/Other Studies Reviewed:    The following studies were reviewed today:       Recent Labs: 04/07/2024: ALT 11; Hemoglobin 14.9; Platelets 130 04/08/2024: B Natriuretic Peptide 1,748.8; Magnesium 1.9 05/03/2024: BUN 22; Creatinine, Ser 1.09; Potassium 4.1; Sodium 142  Recent Lipid Panel    Component Value Date/Time   CHOL 181 06/04/2022 0850   TRIG 158 (H) 06/04/2022 0850   HDL 33 (L) 06/04/2022 0850   CHOLHDL 5.5 (H) 06/04/2022 0850   CHOLHDL 5 12/24/2016 0804   VLDL 42.0 (H) 12/24/2016 0804   LDLCALC 120 (H) 06/04/2022 0850   LDLDIRECT 107.0 01/08/2022 0813  Risk Assessment/Calculations:                Physical Exam:    VS:  BP 108/74 (BP Location: Right Arm, Patient Position: Sitting, Cuff Size: Normal)   Pulse (!) 46   Ht 5' 10 (1.778 m)   Wt 164 lb 12.8 oz (74.8 kg)   SpO2 96%   BMI 23.65 kg/m     Wt Readings from Last 3 Encounters:  06/01/24 164 lb 12.8 oz (74.8 kg)  05/03/24 170 lb (77.1 kg)  04/09/24 166 lb 7.2 oz (75.5 kg)     GEN: elderly male presenting in a wheelchair HEENT: Normal NECK: No JVD; No carotid bruits LYMPHATICS: No lymphadenopathy CARDIAC: RRR, no murmurs, rubs, gallops RESPIRATORY:  Clear to auscultation without rales, wheezing or rhonchi  ABDOMEN: Soft, non-tender, non-distended MUSCULOSKELETAL:  mild B LE edema; No deformity  SKIN: Warm and dry NEUROLOGIC:  Alert and oriented x 3 PSYCHIATRIC:  Normal affect   ASSESSMENT:    1. Shortness of breath   2. Acute systolic heart failure (HCC)   3. Stage 3a chronic kidney disease (HCC)   4. Primary hypertension   5. Hyperlipidemia with target LDL less than 70   6. Urinary retention   7. PVC's (premature ventricular contractions)    PLAN:    In order of problems listed above:  Acute on chronic systolic and diastolic heart failure CKD stage IIIa - Most recent creatinine 1.09 on 6/60/2025 - ?Has been taking 20 mg Lasix  daily but unclear what he  is actually receiving- listed as weekly on paperwork from facility - given three weeks of symptoms, recommend 40 mg lasix  x 3 days (starting 06/02/24 qAM), then 20 mg lasix  daily in the morning -- continue 12.5 mg spironolactone  every morning -- recommend compression socks every day starting in the morning, remove in the evening for bed   Hypertension - Managed in the context of CHF -- on 25 mg losartan  - BP low-normal, can discontinue this is his BP drops below 100 systolic with diuresis -- asymptomatic with today's BP   PVCs - Noted on heart monitor 02/2020 but PVC burden less than 1% -Continue low-dose Toprol    Hyperlipidemia with LDL goal less than 70 - Maintained on 80 mg Lipitor  and 10 mg Zetia    ?Urinary retention - reports at times he can't ure the bathroom - on flomax - recommend UA at the facility to rule out UTI    She shares with me that they will be consulting palliative care in a few days.   Follow up with me in 2-3 weeks.      Medication Adjustments/Labs and Tests Ordered: Current medicines are reviewed at length with the patient today.  Concerns regarding medicines are outlined above.  No orders of the defined types were placed in this encounter.  Meds ordered this encounter  Medications   furosemide  (LASIX ) 40 MG tablet    Sig: Take 1 tablet (40 mg total) by mouth in the morning for 3 days, THEN 0.5 tablets (20 mg total) daily.    Dispense:  3 tablet    Refill:  3   spironolactone  (ALDACTONE ) 25 MG tablet    Sig: Take 0.5 tablets (12.5 mg total) by mouth in the morning.    Dispense:  90 tablet    Refill:  3    Patient Instructions  Medication Instructions:  CHANGE Lasix  40 mg daily take in the morning then take 20 mg daily in the morning thereafter  CHANGE Spironolactone  12.5 mg daily take in the morning  *If you need a refill on your cardiac medications before your next appointment, please call your pharmacy*  Lab Work: BMP at your facility in  10 days  If you have labs (blood work) drawn today and your tests are completely normal, you will receive your results only by: MyChart Message (if you have MyChart) OR A paper copy in the mail If you have any lab test that is abnormal or we need to change your treatment, we will call you to review the results.  Testing/Procedures: Urine analysis at your facility   Follow-Up: At [facility], you and your health needs are our priority.  As part of our continuing mission to provide you with exceptional heart care, our providers are all part of one team.  This team includes your primary Cardiologist (physician) and Advanced Practice Providers or APPs (Physician Assistants and Nurse Practitioners) who all work together to provide you with the care you need, when you need it.  Your next appointment:   June 10, 2024 at 10:05 AM  Provider:   Mercy Hails PA-C    Other Instructions:  Put on compression stocking in the morning every day and remove at bed time      Signed, Jon Nat Hails, PA  06/01/2024 9:15 AM    [facility]

## 2024-05-31 NOTE — Telephone Encounter (Signed)
Left message for Travonne to call back. 

## 2024-05-31 NOTE — Telephone Encounter (Signed)
 Pt wife requesting a c/b in regards to previous phone notes.

## 2024-05-31 NOTE — Telephone Encounter (Signed)
 Call transferred to RN.  Elspeth, a NP with [facility], returning call to report patient has continued to have worsening SOB.  Patient was given Lasix  20 mg x 2 doses last week. Short-term improvement in SOB noted, but has returned. Some swelling in lower extremities noted, patient wearing compression stockings. Elspeth states their orders require them to call our office if patient needs to take Lasix  more than once in a week.  Next available appt with APP recommended by Katlyn West, scheduled patient appt with Jon Hails, PA-C for tomorrow 06/01/24 at 8:25 AM. Elspeth states he will notify [facility] transportation services.  Also called patient's wife Candis and informed her of appt scheduled for tomorrow. She verbalized understanding and expressed appreciation for call.

## 2024-05-31 NOTE — Telephone Encounter (Signed)
 Wife calling in trying to get clarification.  Says the facility has orders for pt to take Lasix  once a week PRN and they are calling to see if can give more frequently.  Aware we did reach out to facility last week and left message to discuss this further.  Wife is going to call facility and have them call us  today.  Aware I will forward this to Dr Skeeter office for when someone calls back.

## 2024-05-31 NOTE — Telephone Encounter (Signed)
 Caller Carolan) stated he is returning staff call regarding patient's worsening SOB.

## 2024-06-01 ENCOUNTER — Ambulatory Visit: Attending: Physician Assistant | Admitting: Physician Assistant

## 2024-06-01 ENCOUNTER — Encounter: Payer: Self-pay | Admitting: Physician Assistant

## 2024-06-01 VITALS — BP 108/74 | HR 46 | Ht 70.0 in | Wt 164.8 lb

## 2024-06-01 DIAGNOSIS — I10 Essential (primary) hypertension: Secondary | ICD-10-CM | POA: Diagnosis not present

## 2024-06-01 DIAGNOSIS — R0602 Shortness of breath: Secondary | ICD-10-CM | POA: Diagnosis not present

## 2024-06-01 DIAGNOSIS — E785 Hyperlipidemia, unspecified: Secondary | ICD-10-CM

## 2024-06-01 DIAGNOSIS — I493 Ventricular premature depolarization: Secondary | ICD-10-CM

## 2024-06-01 DIAGNOSIS — E1122 Type 2 diabetes mellitus with diabetic chronic kidney disease: Secondary | ICD-10-CM | POA: Diagnosis not present

## 2024-06-01 DIAGNOSIS — Z9181 History of falling: Secondary | ICD-10-CM | POA: Diagnosis not present

## 2024-06-01 DIAGNOSIS — R339 Retention of urine, unspecified: Secondary | ICD-10-CM

## 2024-06-01 DIAGNOSIS — N1831 Chronic kidney disease, stage 3a: Secondary | ICD-10-CM | POA: Diagnosis not present

## 2024-06-01 DIAGNOSIS — R2689 Other abnormalities of gait and mobility: Secondary | ICD-10-CM | POA: Diagnosis not present

## 2024-06-01 DIAGNOSIS — I5021 Acute systolic (congestive) heart failure: Secondary | ICD-10-CM

## 2024-06-01 DIAGNOSIS — G231 Progressive supranuclear ophthalmoplegia [Steele-Richardson-Olszewski]: Secondary | ICD-10-CM | POA: Diagnosis not present

## 2024-06-01 MED ORDER — SPIRONOLACTONE 25 MG PO TABS
12.5000 mg | ORAL_TABLET | Freq: Every morning | ORAL | 3 refills | Status: AC
Start: 2024-06-01 — End: ?

## 2024-06-01 MED ORDER — FUROSEMIDE 40 MG PO TABS: ORAL_TABLET | ORAL | 3 refills | Status: AC

## 2024-06-01 NOTE — Telephone Encounter (Signed)
 Left message to call back

## 2024-06-01 NOTE — Telephone Encounter (Signed)
 Garnette Immordino NP with [facility] left message to call back

## 2024-06-01 NOTE — Patient Instructions (Addendum)
 Medication Instructions:  CHANGE Lasix  40 mg daily for 3 days take in the morning then take 20 mg daily in the morning thereafter  CHANGE Spironolactone  12.5 mg daily take in the morning  *If you need a refill on your cardiac medications before your next appointment, please call your pharmacy*  Lab Work: BMP at your facility in 10 days  If you have labs (blood work) drawn today and your tests are completely normal, you will receive your results only by: MyChart Message (if you have MyChart) OR A paper copy in the mail If you have any lab test that is abnormal or we need to change your treatment, we will call you to review the results.  Testing/Procedures: Urine analysis at your facility   Follow-Up: At [facility], you and your health needs are our priority.  As part of our continuing mission to provide you with exceptional heart care, our providers are all part of one team.  This team includes your primary Cardiologist (physician) and Advanced Practice Providers or APPs (Physician Assistants and Nurse Practitioners) who all work together to provide you with the care you need, when you need it.  Your next appointment:   June 10, 2024 at 10:05 AM  Provider:   Mercy Hails PA-C    Other Instructions:  Put on compression stocking in the morning every day and remove at bed time

## 2024-06-01 NOTE — Telephone Encounter (Signed)
 Will forward to Angie D PA who saw patient today

## 2024-06-10 ENCOUNTER — Ambulatory Visit: Admitting: Physician Assistant

## 2024-06-11 NOTE — Telephone Encounter (Signed)
 Closing phone note. See canceled appointment note. Pts f/u appointment was canceled due to pt being in hospice.

## 2024-06-21 ENCOUNTER — Ambulatory Visit (HOSPITAL_BASED_OUTPATIENT_CLINIC_OR_DEPARTMENT_OTHER): Admitting: Family
